# Patient Record
Sex: Female | Born: 1980 | Race: White | Hispanic: No | Marital: Single | Smoking: Former smoker
Health system: Southern US, Community
[De-identification: ages and names within clinical notes are randomized; demographics above are authoritative.]

## PROBLEM LIST (undated history)

## (undated) DIAGNOSIS — I10 Essential (primary) hypertension: Secondary | ICD-10-CM

## (undated) DIAGNOSIS — F1010 Alcohol abuse, uncomplicated: Secondary | ICD-10-CM

## (undated) DIAGNOSIS — Z9889 Other specified postprocedural states: Secondary | ICD-10-CM

## (undated) DIAGNOSIS — R112 Nausea with vomiting, unspecified: Secondary | ICD-10-CM

## (undated) DIAGNOSIS — Z8489 Family history of other specified conditions: Secondary | ICD-10-CM

## (undated) DIAGNOSIS — F329 Major depressive disorder, single episode, unspecified: Secondary | ICD-10-CM

## (undated) DIAGNOSIS — K2970 Gastritis, unspecified, without bleeding: Secondary | ICD-10-CM

## (undated) DIAGNOSIS — K7010 Alcoholic hepatitis without ascites: Secondary | ICD-10-CM

## (undated) DIAGNOSIS — K859 Acute pancreatitis without necrosis or infection, unspecified: Secondary | ICD-10-CM

## (undated) DIAGNOSIS — D509 Iron deficiency anemia, unspecified: Secondary | ICD-10-CM

## (undated) DIAGNOSIS — K922 Gastrointestinal hemorrhage, unspecified: Secondary | ICD-10-CM

## (undated) DIAGNOSIS — F32A Depression, unspecified: Secondary | ICD-10-CM

## (undated) HISTORY — PX: MULTIPLE TOOTH EXTRACTIONS: SHX2053

## (undated) HISTORY — PX: PLACEMENT OF BREAST IMPLANTS: SHX6334

---

## 2001-01-19 ENCOUNTER — Other Ambulatory Visit: Admission: RE | Admit: 2001-01-19 | Discharge: 2001-01-19 | Payer: Self-pay | Admitting: Obstetrics and Gynecology

## 2005-06-18 ENCOUNTER — Inpatient Hospital Stay (HOSPITAL_COMMUNITY): Admission: AD | Admit: 2005-06-18 | Discharge: 2005-06-18 | Payer: Self-pay | Admitting: *Deleted

## 2012-08-24 ENCOUNTER — Other Ambulatory Visit: Payer: Self-pay | Admitting: Internal Medicine

## 2012-08-24 DIAGNOSIS — R109 Unspecified abdominal pain: Secondary | ICD-10-CM

## 2012-08-31 ENCOUNTER — Ambulatory Visit
Admission: RE | Admit: 2012-08-31 | Discharge: 2012-08-31 | Disposition: A | Discharge status: Home / Self Care | Payer: BC Managed Care – PPO | Source: Ambulatory Visit | Attending: Internal Medicine | Admitting: Internal Medicine

## 2012-08-31 DIAGNOSIS — R109 Unspecified abdominal pain: Secondary | ICD-10-CM

## 2016-06-04 ENCOUNTER — Encounter (HOSPITAL_COMMUNITY): Payer: Self-pay | Admitting: Emergency Medicine

## 2016-06-04 ENCOUNTER — Observation Stay (HOSPITAL_COMMUNITY)
Admission: EM | Admit: 2016-06-04 | Discharge: 2016-06-05 | Disposition: A | Discharge status: Home / Self Care | Payer: Self-pay | Attending: Internal Medicine | Admitting: Internal Medicine

## 2016-06-04 ENCOUNTER — Emergency Department (HOSPITAL_COMMUNITY): Payer: Self-pay

## 2016-06-04 DIAGNOSIS — E872 Acidosis, unspecified: Secondary | ICD-10-CM

## 2016-06-04 DIAGNOSIS — F10239 Alcohol dependence with withdrawal, unspecified: Secondary | ICD-10-CM | POA: Insufficient documentation

## 2016-06-04 DIAGNOSIS — A419 Sepsis, unspecified organism: Secondary | ICD-10-CM

## 2016-06-04 DIAGNOSIS — K5730 Diverticulosis of large intestine without perforation or abscess without bleeding: Secondary | ICD-10-CM | POA: Insufficient documentation

## 2016-06-04 DIAGNOSIS — K760 Fatty (change of) liver, not elsewhere classified: Secondary | ICD-10-CM | POA: Insufficient documentation

## 2016-06-04 DIAGNOSIS — K219 Gastro-esophageal reflux disease without esophagitis: Secondary | ICD-10-CM | POA: Insufficient documentation

## 2016-06-04 DIAGNOSIS — F1010 Alcohol abuse, uncomplicated: Secondary | ICD-10-CM | POA: Insufficient documentation

## 2016-06-04 DIAGNOSIS — R112 Nausea with vomiting, unspecified: Secondary | ICD-10-CM | POA: Diagnosis present

## 2016-06-04 DIAGNOSIS — F10931 Alcohol use, unspecified with withdrawal delirium: Secondary | ICD-10-CM

## 2016-06-04 DIAGNOSIS — K2970 Gastritis, unspecified, without bleeding: Principal | ICD-10-CM | POA: Insufficient documentation

## 2016-06-04 DIAGNOSIS — R6510 Systemic inflammatory response syndrome (SIRS) of non-infectious origin without acute organ dysfunction: Secondary | ICD-10-CM

## 2016-06-04 DIAGNOSIS — F10231 Alcohol dependence with withdrawal delirium: Secondary | ICD-10-CM

## 2016-06-04 DIAGNOSIS — K226 Gastro-esophageal laceration-hemorrhage syndrome: Secondary | ICD-10-CM | POA: Insufficient documentation

## 2016-06-04 DIAGNOSIS — K922 Gastrointestinal hemorrhage, unspecified: Secondary | ICD-10-CM

## 2016-06-04 DIAGNOSIS — R739 Hyperglycemia, unspecified: Secondary | ICD-10-CM | POA: Insufficient documentation

## 2016-06-04 DIAGNOSIS — Z87891 Personal history of nicotine dependence: Secondary | ICD-10-CM | POA: Insufficient documentation

## 2016-06-04 DIAGNOSIS — E860 Dehydration: Secondary | ICD-10-CM | POA: Insufficient documentation

## 2016-06-04 LAB — I-STAT CHEM 8, ED
BUN: 9 mg/dL (ref 6–20)
CALCIUM ION: 1.01 mmol/L — AB (ref 1.15–1.40)
CREATININE: 0.8 mg/dL (ref 0.44–1.00)
Chloride: 105 mmol/L (ref 101–111)
Glucose, Bld: 173 mg/dL — ABNORMAL HIGH (ref 65–99)
HCT: 44 % (ref 36.0–46.0)
Hemoglobin: 15 g/dL (ref 12.0–15.0)
Potassium: 4.4 mmol/L (ref 3.5–5.1)
SODIUM: 139 mmol/L (ref 135–145)
TCO2: 21 mmol/L (ref 0–100)

## 2016-06-04 LAB — RAPID URINE DRUG SCREEN, HOSP PERFORMED
AMPHETAMINES: NOT DETECTED
BENZODIAZEPINES: NOT DETECTED
Barbiturates: NOT DETECTED
Cocaine: NOT DETECTED
Opiates: NOT DETECTED
TETRAHYDROCANNABINOL: NOT DETECTED

## 2016-06-04 LAB — COMPREHENSIVE METABOLIC PANEL
ALT: 77 U/L — AB (ref 14–54)
AST: 149 U/L — AB (ref 15–41)
Albumin: 4.8 g/dL (ref 3.5–5.0)
Alkaline Phosphatase: 64 U/L (ref 38–126)
Anion gap: 18 — ABNORMAL HIGH (ref 5–15)
BILIRUBIN TOTAL: 1.8 mg/dL — AB (ref 0.3–1.2)
BUN: 10 mg/dL (ref 6–20)
CALCIUM: 9.2 mg/dL (ref 8.9–10.3)
CO2: 20 mmol/L — ABNORMAL LOW (ref 22–32)
CREATININE: 0.95 mg/dL (ref 0.44–1.00)
Chloride: 100 mmol/L — ABNORMAL LOW (ref 101–111)
GFR calc Af Amer: >60 mL/min (ref >60)
Glucose, Bld: 172 mg/dL — ABNORMAL HIGH (ref 65–99)
Potassium: 4.3 mmol/L (ref 3.5–5.1)
Sodium: 138 mmol/L (ref 135–145)
TOTAL PROTEIN: 8.5 g/dL — AB (ref 6.5–8.1)

## 2016-06-04 LAB — POC OCCULT BLOOD, ED: Fecal Occult Bld: NEGATIVE

## 2016-06-04 LAB — URINALYSIS, ROUTINE W REFLEX MICROSCOPIC
Bilirubin Urine: NEGATIVE
Glucose, UA: NEGATIVE mg/dL
HGB URINE DIPSTICK: NEGATIVE
Ketones, ur: 80 mg/dL — AB
LEUKOCYTES UA: NEGATIVE
NITRITE: NEGATIVE
Protein, ur: 30 mg/dL — AB
pH: 7 (ref 5.0–8.0)

## 2016-06-04 LAB — CBC
HCT: 40 % (ref 36.0–46.0)
Hemoglobin: 13.4 g/dL (ref 12.0–15.0)
MCH: 32.4 pg (ref 26.0–34.0)
MCHC: 33.5 g/dL (ref 30.0–36.0)
MCV: 96.9 fL (ref 78.0–100.0)
PLATELETS: 402 10*3/uL — AB (ref 150–400)
RBC: 4.13 MIL/uL (ref 3.87–5.11)
RDW: 14.2 % (ref 11.5–15.5)
WBC: 7.3 10*3/uL (ref 4.0–10.5)

## 2016-06-04 LAB — ETHANOL

## 2016-06-04 LAB — TYPE AND SCREEN
ABO/RH(D): A POS
ANTIBODY SCREEN: NEGATIVE

## 2016-06-04 LAB — TSH: TSH: 1.754 u[IU]/mL (ref 0.350–4.500)

## 2016-06-04 LAB — ABO/RH: ABO/RH(D): A POS

## 2016-06-04 LAB — I-STAT BETA HCG BLOOD, ED (MC, WL, AP ONLY)

## 2016-06-04 LAB — I-STAT CG4 LACTIC ACID, ED
Lactic Acid, Venous: 3.71 mmol/L (ref 0.5–1.9)
Lactic Acid, Venous: 0.79 mmol/L (ref 0.5–1.9)

## 2016-06-04 LAB — ACETAMINOPHEN LEVEL: Acetaminophen (Tylenol), Serum: <10 ug/mL — ABNORMAL LOW (ref 10–30)

## 2016-06-04 LAB — LIPASE, BLOOD: Lipase: 33 U/L (ref 11–51)

## 2016-06-04 LAB — SEDIMENTATION RATE: Sed Rate: 1 mm/h (ref 0–22)

## 2016-06-04 MED ORDER — LORAZEPAM 2 MG/ML IJ SOLN: 1.0000 mg | Freq: Four times a day (QID) | INTRAMUSCULAR | Status: DC | PRN

## 2016-06-04 MED ORDER — SODIUM CHLORIDE 0.9 % IV SOLN
INTRAVENOUS | Status: DC
  Administered 2016-06-04 – 2016-06-05 (×2): via INTRAVENOUS

## 2016-06-04 MED ORDER — LORAZEPAM 1 MG PO TABS
1.0000 mg | ORAL_TABLET | Freq: Four times a day (QID) | ORAL | Status: DC | PRN
  Administered 2016-06-04: 1 mg via ORAL
  Filled 2016-06-04: qty 1

## 2016-06-04 MED ORDER — FOLIC ACID 1 MG PO TABS
1.0000 mg | ORAL_TABLET | Freq: Every day | ORAL | Status: DC
  Administered 2016-06-04 – 2016-06-05 (×2): 1 mg via ORAL
  Filled 2016-06-04 (×2): qty 1

## 2016-06-04 MED ORDER — LORAZEPAM 2 MG/ML IJ SOLN
0.0000 mg | Freq: Four times a day (QID) | INTRAMUSCULAR | Status: DC
  Administered 2016-06-05: 2 mg via INTRAVENOUS
  Filled 2016-06-04 (×2): qty 1

## 2016-06-04 MED ORDER — LORAZEPAM 2 MG/ML IJ SOLN
0.0000 mg | Freq: Two times a day (BID) | INTRAMUSCULAR | Status: DC
Start: 2016-06-06 — End: 2016-06-05

## 2016-06-04 MED ORDER — VITAMIN B-1 100 MG PO TABS
100.0000 mg | ORAL_TABLET | Freq: Every day | ORAL | Status: DC
  Administered 2016-06-04 – 2016-06-05 (×2): 100 mg via ORAL
  Filled 2016-06-04 (×2): qty 1

## 2016-06-04 MED ORDER — SODIUM CHLORIDE 0.9 % IV SOLN
8.0000 mg/h | INTRAVENOUS | Status: DC
  Administered 2016-06-04 (×2): 8 mg/h via INTRAVENOUS
  Filled 2016-06-04 (×4): qty 80

## 2016-06-04 MED ORDER — TRAMADOL HCL 50 MG PO TABS
50.0000 mg | ORAL_TABLET | Freq: Four times a day (QID) | ORAL | Status: DC | PRN
  Administered 2016-06-04 – 2016-06-05 (×2): 50 mg via ORAL
  Filled 2016-06-04 (×2): qty 1

## 2016-06-04 MED ORDER — THIAMINE HCL 100 MG/ML IJ SOLN: 100.0000 mg | Freq: Every day | INTRAMUSCULAR | Status: DC

## 2016-06-04 MED ORDER — LORAZEPAM 2 MG/ML IJ SOLN
1.0000 mg | Freq: Once | INTRAMUSCULAR | Status: AC
Start: 2016-06-04 — End: 2016-06-04
  Administered 2016-06-04: 1 mg via INTRAVENOUS
  Filled 2016-06-04: qty 1

## 2016-06-04 MED ORDER — ONDANSETRON 4 MG PO TBDP
4.0000 mg | ORAL_TABLET | Freq: Once | ORAL | Status: DC | PRN
  Filled 2016-06-04: qty 1

## 2016-06-04 MED ORDER — LORAZEPAM 2 MG/ML IJ SOLN
0.5000 mg | Freq: Once | INTRAMUSCULAR | Status: AC
  Administered 2016-06-04: 0.5 mg via INTRAVENOUS
  Filled 2016-06-04: qty 1

## 2016-06-04 MED ORDER — LORAZEPAM 2 MG/ML IJ SOLN
0.0000 mg | Freq: Four times a day (QID) | INTRAMUSCULAR | Status: DC
Start: 2016-06-04 — End: 2016-06-05

## 2016-06-04 MED ORDER — SODIUM CHLORIDE 0.9 % IV BOLUS (SEPSIS)
1000.0000 mL | Freq: Once | INTRAVENOUS | Status: AC
  Administered 2016-06-04: 1000 mL via INTRAVENOUS

## 2016-06-04 MED ORDER — SODIUM CHLORIDE 0.9 % IV SOLN
80.0000 mg | Freq: Once | INTRAVENOUS | Status: AC
  Administered 2016-06-04: 80 mg via INTRAVENOUS
  Filled 2016-06-04: qty 80

## 2016-06-04 MED ORDER — SODIUM CHLORIDE 0.9 % IV BOLUS (SEPSIS)
2000.0000 mL | Freq: Once | INTRAVENOUS | Status: AC
  Administered 2016-06-04: 2000 mL via INTRAVENOUS

## 2016-06-04 MED ORDER — IOPAMIDOL (ISOVUE-300) INJECTION 61%
INTRAVENOUS | Status: AC
  Administered 2016-06-04: 100 mL
  Filled 2016-06-04: qty 100

## 2016-06-04 MED ORDER — ONDANSETRON HCL 4 MG PO TABS: 4.0000 mg | ORAL_TABLET | Freq: Four times a day (QID) | ORAL | Status: DC | PRN

## 2016-06-04 MED ORDER — ONDANSETRON HCL 4 MG/2ML IJ SOLN
4.0000 mg | Freq: Four times a day (QID) | INTRAMUSCULAR | Status: DC | PRN
  Administered 2016-06-05: 4 mg via INTRAVENOUS
  Filled 2016-06-04: qty 2

## 2016-06-04 MED ORDER — GI COCKTAIL ~~LOC~~
30.0000 mL | Freq: Once | ORAL | Status: AC
  Administered 2016-06-04: 30 mL via ORAL
  Filled 2016-06-04: qty 30

## 2016-06-04 MED ORDER — ONDANSETRON HCL 4 MG/2ML IJ SOLN
4.0000 mg | Freq: Once | INTRAMUSCULAR | Status: AC
  Administered 2016-06-04: 4 mg via INTRAVENOUS
  Filled 2016-06-04: qty 2

## 2016-06-04 MED ORDER — SODIUM CHLORIDE 0.9 % IV SOLN
INTRAVENOUS | Status: DC
  Administered 2016-06-04: 15:00:00 via INTRAVENOUS

## 2016-06-04 MED ORDER — ADULT MULTIVITAMIN W/MINERALS CH
1.0000 | ORAL_TABLET | Freq: Every day | ORAL | Status: DC
  Administered 2016-06-04 – 2016-06-05 (×2): 1 via ORAL
  Filled 2016-06-04 (×2): qty 1

## 2016-06-04 NOTE — ED Notes (Signed)
Patient transported to CT 

## 2016-06-04 NOTE — ED Notes (Signed)
Unable to collect labs patient is talking with MD

## 2016-06-04 NOTE — ED Triage Notes (Addendum)
Pt c/o shaking, abdominal pain, emesis, lightheadedness onset last night. Firm swelling above umbilicus. Pt reports feeling same symptoms about 2 weeks before menstruation for about 6 months, symptoms usually resolve spontaneously, including abdominal swelling, symptoms have been as bad as they are today in past. No diarrhea.

## 2016-06-04 NOTE — Progress Notes (Signed)
ED CM noted CM consult for medication assistance   After reviewing EDP note to find pt primarily on OTC medications at this time, there is not a [facility] Medication program to assist with OTC med costs  Med list noted as acetaminophen (TYLENOL) 325 MG tablet Take 650 mg by mouth every 6 (six) hours as needed.  Cyanocobalamin (B-12 PO) Take 1 tablet by mouth daily.    esomeprazole (NEXIUM) 20 MG capsule Take 20 mg by mouth daily at 12 noon.    ferrous sulfate 325 (65 FE) MG tablet Take 325 mg by mouth daily with breakfast.    Multiple Vitamin (MULTIVITAMIN WITH MINERALS) TABS tablet Take 2 tablets by mouth daily      Pending possible other medications upon d/c

## 2016-06-04 NOTE — Progress Notes (Signed)
Spoke with pt, female visitor and female visitor about pt OTC meds and goodrx for use to get discount for any prescribed meds

## 2016-06-04 NOTE — H&P (Signed)
History and Physical    Susan [name] MBP:[id number] DOB: 1981/01/18 DOA: 06/04/2016  PCP: No PCP Per Patient   Patient coming from: Home  Chief Complaint: Nausea/Vomiting/ Throwing up [place] Shark Vomit  HPI: Susan [name] is a 35 y.o. female with medical history significant of Previous Tobacco Abuse and EtOH use/abuse and no other real comorbids who presents to [facility] with a cc of N/V and episodes of coffee ground emesis that started last night after she got home from a family Christmas party where she was drinking "some sips of  Her and her dad's drink." States she usually gets Naueous and vomits violently when she ovulates and states she had her period first start 2 weeks ago. States she vomitted so much that she came to the ED for evaluation this Am. Admitted to abdominal tenderness and Nausea. No CP or SOB. No Diarrhea. States she vomitted "brown looking vomit like coffee." Has been a very heavy drinker in the past and states she still drinks. Per EDP he states she admitted to him that she enjoys vodka. No other concerns or complaints except that the patient was very tremulous and sweaty/anxious and felt clammy after vomiting and felt nervous and had electrical sensations run in her body. EDP was concerned so she was admitted for suspected Upper GI Bleed, Intractable N/V and Concern for active EtOH withdrawal given her drinking history.   ED Course: Bolused 3 liters and had CT of Abd/Pelvis which was unreavealing. Given 2 doses of Ativan.   Review of Systems: As per HPI otherwise 10 point review of systems negative.   MEDICAL HISTORY History reviewed. No pertinent past medical history.  SURGICAL HISTORY History reviewed. Has had recent Breast Implants.   SOCIAL HISTORY  reports that she has quit smoking. She has never used smokeless tobacco. She reports that she drinks alcohol. She reports that she does not use drugs.  No Known Allergies  FAMILY HISTORY History reviewed. No  pertinent family history on Fathers side as patient does not communicate with father. Mother has Diabetes and also GI Issues.   Prior to Admission medications   Medication Sig Start Date End Date Taking? Authorizing Provider  acetaminophen (TYLENOL) 325 MG tablet Take 650 mg by mouth every 6 (six) hours as needed.   Yes Historical Provider, MD  Cyanocobalamin (B-12 PO) Take 1 tablet by mouth daily.   Yes Historical Provider, MD  esomeprazole (NEXIUM) 20 MG capsule Take 20 mg by mouth daily at 12 noon.   Yes Historical Provider, MD  ferrous sulfate 325 (65 FE) MG tablet Take 325 mg by mouth daily with breakfast.   Yes Historical Provider, MD  Multiple Vitamin (MULTIVITAMIN WITH MINERALS) TABS tablet Take 2 tablets by mouth daily.   Yes Historical Provider, MD    Physical Exam: Vitals:   06/04/16 1103 06/04/16 1124 06/04/16 1200 06/04/16 1415  BP: 109/90 132/94 127/96 127/96  Pulse: 107 103 99 104  Resp: 14 19 18    Temp:      TempSrc:      SpO2: 99% 99% 98%    Constitutional: WN/WD, NAD and appears calm and comfortable Eyes: Lids and conjunctivae normal, sclerae anicteric  ENMT: External Ears, Nose appear normal. Grossly normal hearing.  Neck: Appears normal, supple, no cervical masses, normal ROM, no appreciable thyromegaly Respiratory: Clear to auscultation bilaterally, no wheezing, rales, rhonchi or crackles. Normal respiratory effort and patient is not tachypenic. No accessory muscle use.  Cardiovascular: RRR, no murmurs / rubs / gallops. S1  and S2 auscultated. No extremity edema.  Abdomen: Soft, Mildly tender to palpation (patient doesn't grimace but states its tender), non-distended. No masses palpated. No appreciable hepatosplenomegaly. Bowel sounds positive x4.  GU: Deferred. Musculoskeletal: No clubbing / cyanosis of digits/nails. No joint deformity upper and lower extremities. Good ROM, no contractures. Normal strength and muscle tone.  Skin: No rashes, lesions, ulcers. No  induration; Warm and dry.  Neurologic: CN 2-12 grossly intact with no focal deficits. Sensation intact in all 4 Extremities. Romberg sign cerebellar reflexes not assessed.  Psychiatric: Normal judgment and insight. Alert and oriented x 3. Normal mood and appropriate affect.   Labs on Admission: I have personally reviewed following labs and imaging studies  CBC:  Recent Labs Lab 06/04/16 1055 06/04/16 1111  WBC 7.3  --   HGB 13.4 15.0  HCT 40.0 44.0  MCV 96.9  --   PLT 402*  --    Basic Metabolic Panel:  Recent Labs Lab 06/04/16 1055 06/04/16 1111  NA 138 139  K 4.3 4.4  CL 100* 105  CO2 20*  --   GLUCOSE 172* 173*  BUN 10 9  CREATININE 0.95 0.80  CALCIUM 9.2  --    GFR: CrCl cannot be calculated (Unknown ideal weight.). Liver Function Tests:  Recent Labs Lab 06/04/16 1055  AST 149*  ALT 77*  ALKPHOS 64  BILITOT 1.8*  PROT 8.5*  ALBUMIN 4.8    Recent Labs Lab 06/04/16 1055  LIPASE 33   No results for input(s): AMMONIA in the last 168 hours. Coagulation Profile: No results for input(s): INR, PROTIME in the last 168 hours. Cardiac Enzymes: No results for input(s): CKTOTAL, CKMB, CKMBINDEX, TROPONINI in the last 168 hours. BNP (last 3 results) No results for input(s): PROBNP in the last 8760 hours. HbA1C: No results for input(s): HGBA1C in the last 72 hours. CBG: No results for input(s): GLUCAP in the last 168 hours. Lipid Profile: No results for input(s): CHOL, HDL, LDLCALC, TRIG, CHOLHDL, LDLDIRECT in the last 72 hours. Thyroid Function Tests: No results for input(s): TSH, T4TOTAL, FREET4, T3FREE, THYROIDAB in the last 72 hours. Anemia Panel: No results for input(s): VITAMINB12, FOLATE, FERRITIN, TIBC, IRON, RETICCTPCT in the last 72 hours. Urine analysis:    Component Value Date/Time   COLORURINE YELLOW 06/04/2016 1238   APPEARANCEUR HAZY (A) 06/04/2016 1238   LABSPEC >1.046 (H) 06/04/2016 1238   PHURINE 7.0 06/04/2016 1238   GLUCOSEU  NEGATIVE 06/04/2016 1238   HGBUR NEGATIVE 06/04/2016 1238   BILIRUBINUR NEGATIVE 06/04/2016 1238   KETONESUR 80 (A) 06/04/2016 1238   PROTEINUR 30 (A) 06/04/2016 1238   NITRITE NEGATIVE 06/04/2016 1238   LEUKOCYTESUR NEGATIVE 06/04/2016 1238   Sepsis Labs: !!!!!!!!!!!!!!!!!!!!!!!!!!!!!!!!!!!!!!!!!!!! @LABRCNTIP (procalcitonin:4,lacticidven:4) )No results found for this or any previous visit (from the past 240 hour(s)).   Radiological Exams on Admission: Ct Abdomen Pelvis W Contrast  Result Date: 06/04/2016 CLINICAL DATA:  Abdominal pain with emesis EXAM: CT ABDOMEN AND PELVIS WITH CONTRAST TECHNIQUE: Multidetector CT imaging of the abdomen and pelvis was performed using the standard protocol following bolus administration of intravenous contrast. CONTRAST:  165m ISOVUE-300 IOPAMIDOL (ISOVUE-300) INJECTION 61% COMPARISON:  Right upper quadrant ultrasound August 31, 2012 FINDINGS: Lower chest: Lung bases are clear. Hepatobiliary: There is hepatic steatosis. No focal liver lesions are evident. Gallbladder wall is not appreciably thickened. There is no biliary duct dilatation. Pancreas: There is no pancreatic mass or inflammatory focus. Spleen: No splenic lesions are evident. Adrenals/Urinary Tract: Adrenals appear normal bilaterally. Kidneys bilaterally  show no evidence of mass or hydronephrosis on either side. There is no renal or ureteral calculus on either side. Urinary bladder is midline with wall thickness within normal limits. Stomach/Bowel: There are occasional sigmoid diverticula without diverticulitis. There is no appreciable bowel wall or mesenteric thickening. No bowel obstruction. No free air or portal venous air. Vascular/Lymphatic: There is no abdominal aortic aneurysm. No vascular lesions are evident. There is a circumaortic left renal vein, an anatomic variant. There is no adenopathy in the abdomen or pelvis. Reproductive: Uterus is anteverted. There is no pelvic mass or pelvic fluid  collection. Other: The appendix appears normal. There is no ascites or abscess in the abdomen or pelvis. Musculoskeletal: There are no blastic or lytic bone lesions. There is mid lumbar dextroscoliosis. There is no intramuscular or abdominal wall lesion. IMPRESSION: Occasional sigmoid diverticula without diverticulitis. No bowel obstruction. No abscess. Appendix region appears unremarkable. No renal or ureteral calculus.  No hydronephrosis. There is hepatic steatosis. A cause for patient's symptoms has not been ascertained with this study. Electronically Signed   By: Lowella Grip III M.D.   On: 06/04/2016 12:37    EKG: Independently reviewed. Sinus Tachycardia with artifact. No ST Elevation or Depression on my read. Had some qtC prolongation at 530. Will repeat EKG.  Assessment/Plan Active Problems:   Intractable vomiting with nausea  Acute Upper GI Bleed from likely from Alcholic Gastritis/Mallory Weiss Tear from N/V -Admit to Tele Obs -Patient admits to drinking a "few sips" of EtOH last night and throwing up coffee ground emesis and has been vomiting last night since 7:00 pm; Per EDP drinks vodka -Gastroenterology Consulted by EDP and Dr. 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 No bowel obstruction. No abscess. Appendix region appears unremarkable. No renal or ureteral calculus. No hydronephrosis. There is hepatic steatosis. A cause for patient's symptoms has not been ascertained with this study. -Lipase Level 32 -Check HbA1c to r/o Gastroparesis  SIRS and Lactic Acidosis  -Likely from Vomiting -Lactic Acid Level was 3.74 on Admission and  trended down to 0.79   Hyperglycemia -Likely Reactive -Check HbA1c- R/o Gastroparesis  Hepatic Steatosis and Elevated LFT's -Likely from EtOH Abuse -AST was 149 and ALT 77 with Characteristic 2:1 ratio indicative of EtOH -Check Acute Hepatitis Panel if not trending down -Tylenol Level <10 and EtOH <5 -Repeat CMP in AM  EtOH Abuse with Concern for Active Withdrawal  -UDS was Negative and EtOH was <5 -EtOH Abuse Counseling given -CIWA Protocol with Ativan -Contine MVI, Folic Acid, and Thaimine  DVT prophylaxis: SCDs Code Status: FULL CODE Family Communication: Discussed with Mother at Bedside Disposition Plan: Telemetry Obs Consults called: Gastroenterology by EDP Admission status: Observation  Kerney Elbe, D.O. [facility] Pager [phone]  If 7PM-7AM, please contact night-coverage [email or web address] Password [facility]  06/04/2016, 3:14 PM

## 2016-06-04 NOTE — Progress Notes (Signed)
CM spoke with pt who confirms uninsured [employer] resident with no pcp.  CM discussed and provided written information to assist pt with determining choice for uninsured accepting pcps, discussed the importance of pcp vs EDP services for f/u care, www.needymeds.org, www.goodrx.com, discounted pharmacies and other [employer] such as [employer] [facility] [employer], affordable care act, financial assistance, uninsured dental services, [place] med assist, DSS and  health department  Reviewed resources for [employer] uninsured accepting pcps like [facility], family medicine at [employer], [facility], [facility], local urgent care centers, [facility], [facility] family practice, general medical clinics, family services of the [place], [facility] urgent care plus others, medication resources, [facility] out patient pharmacies and housing Pt voiced understanding and appreciation of resources provided   Provided [facility] contact information

## 2016-06-04 NOTE — ED Provider Notes (Signed)
[facility]-EMERGENCY DEPT Provider Note   CSN: [id number] Arrival date & time: 06/04/16  3716     History   Chief Complaint Chief Complaint  Patient presents with  . Abdominal Pain    HPI Susan [name] is a 35 y.o. female.  HPI Patient presents with multiple episodes of vomiting starting yesterday evening. States no gross blood but vomitus with coffee-ground-like substance. No melena or gross blood in stool. Had small bowel movement today. Complains of central abdominal pain without radiation. No dysuria or frequency. No vaginal symptoms. Last menstrual period was 2 weeks ago. Patient also has been lightheaded with near syncope. Symptoms are worse with standing. Denies fever or chills. Denies recent NSAID use.  History reviewed. No pertinent past medical history.  There are no active problems to display for this patient.   History reviewed. No pertinent surgical history.  OB History    No data available       Home Medications    Prior to Admission medications   Medication Sig Start Date End Date Taking? Authorizing Provider  acetaminophen (TYLENOL) 325 MG tablet Take 650 mg by mouth every 6 (six) hours as needed.   Yes Historical Provider, MD  Cyanocobalamin (B-12 PO) Take 1 tablet by mouth daily.   Yes Historical Provider, MD  esomeprazole (NEXIUM) 20 MG capsule Take 20 mg by mouth daily at 12 noon.   Yes Historical Provider, MD  ferrous sulfate 325 (65 FE) MG tablet Take 325 mg by mouth daily with breakfast.   Yes Historical Provider, MD  Multiple Vitamin (MULTIVITAMIN WITH MINERALS) TABS tablet Take 2 tablets by mouth daily.   Yes Historical Provider, MD    Family History History reviewed. No pertinent family history.  Social History Social History  Substance Use Topics  . Smoking status: Former Games developer  . Smokeless tobacco: Never Used  . Alcohol use Yes     Comment: occasional     Allergies   Patient has no known allergies.   Review of Systems Review of  Systems  Constitutional: Negative for chills and fatigue.  Respiratory: Negative for shortness of breath.   Cardiovascular: Negative for chest pain.  Gastrointestinal: Positive for abdominal pain, constipation, nausea and vomiting. Negative for blood in stool and diarrhea.  Genitourinary: Negative for dysuria, flank pain, frequency and pelvic pain.  Musculoskeletal: Negative for arthralgias, back pain, joint swelling, myalgias, neck pain and neck stiffness.  Skin: Negative for rash and wound.  Neurological: Positive for dizziness, tremors, weakness (generalized) and light-headedness. Negative for numbness and headaches.  All other systems reviewed and are negative.    Physical Exam Updated Vital Signs BP 132/94 (BP Location: Left Arm)   Pulse 103   Temp 98.4 F (36.9 C) (Oral)   Resp 19   LMP 05/21/2016   SpO2 99%   Physical Exam  Constitutional: She is oriented to person, place, and time. She appears well-developed and well-nourished.  Actively vomiting in triage  HENT:  Head: Normocephalic and atraumatic.  Mouth/Throat: Oropharynx is clear and moist. No oropharyngeal exudate.  Eyes: EOM are normal. Pupils are equal, round, and reactive to light.  Neck: Normal range of motion. Neck supple.  No meningismus  Cardiovascular: Regular rhythm.   Tachycardia  Pulmonary/Chest: Effort normal and breath sounds normal.  Abdominal: Soft. Bowel sounds are normal. There is tenderness. There is no rebound and no guarding.  Periumbilical and epigastric tenderness with palpation. Questionable guarding.  Musculoskeletal: Normal range of motion. She exhibits no edema or tenderness.  No CVA tenderness bilaterally. No lower extremity swelling or tenderness. Distal pulses thready  Neurological: She is alert and oriented to person, place, and time.  Tremor noted. Patient is oriented 4. 5/5 motor in all extremities. Sensation is grossly intact.  Skin: Skin is warm and dry. Capillary refill takes  less than 2 seconds. No rash noted. No erythema. There is pallor.  Pale appearing  Psychiatric: She has a normal mood and affect. Her behavior is normal.  Nursing note and vitals reviewed.    ED Treatments / Results  Labs (all labs ordered are listed, but only abnormal results are displayed) Labs Reviewed  COMPREHENSIVE METABOLIC PANEL - Abnormal; Notable for the following:       Result Value   Chloride 100 (*)    CO2 20 (*)    Glucose, Bld 172 (*)    Total Protein 8.5 (*)    AST 149 (*)    ALT 77 (*)    Total Bilirubin 1.8 (*)    Anion gap 18 (*)    All other components within normal limits  CBC - Abnormal; Notable for the following:    Platelets 402 (*)    All other components within normal limits  URINALYSIS, ROUTINE W REFLEX MICROSCOPIC - Abnormal; Notable for the following:    APPearance HAZY (*)    Specific Gravity, Urine >1.046 (*)    Ketones, ur 80 (*)    Protein, ur 30 (*)    Bacteria, UA FEW (*)    Squamous Epithelial / LPF 6-30 (*)    All other components within normal limits  I-STAT CHEM 8, ED - Abnormal; Notable for the following:    Glucose, Bld 173 (*)    Calcium, Ion 1.01 (*)    All other components within normal limits  I-STAT CG4 LACTIC ACID, ED - Abnormal; Notable for the following:    Lactic Acid, Venous 3.71 (*)    All other components within normal limits  LIPASE, BLOOD  ACETAMINOPHEN LEVEL  SEDIMENTATION RATE  RAPID URINE DRUG SCREEN, HOSP PERFORMED  ETHANOL  I-STAT BETA HCG BLOOD, ED ([facility], [facility], [facility] ONLY)  POC OCCULT BLOOD, ED  I-STAT CG4 LACTIC ACID, ED  TYPE AND SCREEN  ABO/RH    EKG  EKG Interpretation  Date/Time:  Tuesday June 04 2016 11:01:11 EST Ventricular Rate:  113 PR Interval:    QRS Duration: 113 QT Interval:  386 QTC Calculation: 530 R Axis:   101 Text Interpretation:  Sinus tachycardia Borderline intraventricular conduction delay Borderline T abnormalities, anterior leads Prolonged QT interval Confirmed by Jaydin Jalomo   MD, Gayatri Teasdale ([id number]) on 06/04/2016 12:01:27 PM       Radiology Ct Abdomen Pelvis W Contrast  Result Date: 06/04/2016 CLINICAL DATA:  Abdominal pain with emesis EXAM: CT ABDOMEN AND PELVIS WITH CONTRAST TECHNIQUE: Multidetector CT imaging of the abdomen and pelvis was performed using the standard protocol following bolus administration of intravenous contrast. CONTRAST:  ISOVUE-300 IOPAMIDOL (ISOVUE-300) INJECTION 61% COMPARISON:  Right upper quadrant ultrasound August 31, 2012 FINDINGS: Lower chest: Lung bases are clear. Hepatobiliary: There is hepatic steatosis. No focal liver lesions are evident. Gallbladder wall is not appreciably thickened. There is no biliary duct dilatation. Pancreas: There is no pancreatic mass or inflammatory focus. Spleen: No splenic lesions are evident. Adrenals/Urinary Tract: Adrenals appear normal bilaterally. Kidneys bilaterally show no evidence of mass or hydronephrosis on either side. There is no renal or ureteral calculus on either side. Urinary bladder is midline with wall thickness within  normal limits. Stomach/Bowel: There are occasional sigmoid diverticula without diverticulitis. There is no appreciable bowel wall or mesenteric thickening. No bowel obstruction. No free air or portal venous air. Vascular/Lymphatic: There is no abdominal aortic aneurysm. No vascular lesions are evident. There is a circumaortic left renal vein, an anatomic variant. There is no adenopathy in the abdomen or pelvis. Reproductive: Uterus is anteverted. There is no pelvic mass or pelvic fluid collection. Other: The appendix appears normal. There is no ascites or abscess in the abdomen or pelvis. Musculoskeletal: There are no blastic or lytic bone lesions. There is mid lumbar dextroscoliosis. There is no intramuscular or abdominal wall lesion. IMPRESSION: Occasional sigmoid diverticula without diverticulitis. No bowel obstruction. No abscess. Appendix region appears unremarkable. No renal or  ureteral calculus.  No hydronephrosis. There is hepatic steatosis. A cause for patient's symptoms has not been ascertained with this study. Electronically Signed   By: Bretta BangWilliam  Woodruff III M.D.   On: 06/04/2016 12:37    Procedures Procedures (including critical care time)  Medications Ordered in ED Medications  ondansetron (ZOFRAN-ODT) disintegrating tablet 4 mg (not administered)  pantoprazole (PROTONIX) 80 mg in sodium chloride 0.9 % 250 mL (0.32 mg/mL) infusion (8 mg/hr Intravenous New Bag/Given 06/04/16 1144)  LORazepam (ATIVAN) injection 0-4 mg (not administered)    Followed by  LORazepam (ATIVAN) injection 0-4 mg (not administered)  0.9 %  sodium chloride infusion (not administered)  sodium chloride 0.9 % bolus 2,000 mL (0 mLs Intravenous Stopped 06/04/16 1124)  ondansetron (ZOFRAN) injection 4 mg (4 mg Intravenous Given 06/04/16 1124)  pantoprazole (PROTONIX) 80 mg in sodium chloride 0.9 % 100 mL IVPB (0 mg Intravenous Stopped 06/04/16 1210)  LORazepam (ATIVAN) injection 1 mg (1 mg Intravenous Given 06/04/16 1142)  iopamidol (ISOVUE-300) 61 % injection (100 mLs  Contrast Given 06/04/16 1211)  sodium chloride 0.9 % bolus 1,000 mL (0 mLs Intravenous Stopped 06/04/16 1340)  gi cocktail (Maalox,Lidocaine,Donnatal) (30 mLs Oral Given 06/04/16 1339)  LORazepam (ATIVAN) injection 0.5 mg (0.5 mg Intravenous Given 06/04/16 1417)     Initial Impression / Assessment and Plan / ED Course  I have reviewed the triage vital signs and the nursing notes.  Pertinent labs & imaging results that were available during my care of the patient were reviewed by me and considered in my medical decision making (see chart for details).  Clinical Course   Patient with elevated lactic acid. Likely due to dehydration. Given several liters of IV fluids with improvement of lactic acid levels. Patient does have prolonged QT interval of uncertain significance. Patient's tremors that have improved with Ativan.  Suspect there is some degree of alcohol withdrawal. Placed on CIWA protocol. Discussed with gastroenterologist who will see patient in consult. Hospitalist to admit.    Final Clinical Impressions(s) / ED Diagnoses   Final diagnoses:  Intractable vomiting with nausea, unspecified vomiting type  Dehydration  Lactic acidosis    New Prescriptions New Prescriptions   No medications on file     Loren Raceravid Demonie Kassa, MD 06/04/16 1442

## 2016-06-05 ENCOUNTER — Observation Stay (HOSPITAL_COMMUNITY): Payer: Self-pay | Admitting: Anesthesiology

## 2016-06-05 ENCOUNTER — Encounter (HOSPITAL_COMMUNITY): Payer: Self-pay

## 2016-06-05 ENCOUNTER — Encounter (HOSPITAL_COMMUNITY)
Admission: EM | Disposition: A | Discharge status: Home / Self Care | Payer: Self-pay | Source: Home / Self Care | Attending: Emergency Medicine

## 2016-06-05 DIAGNOSIS — G43A1 Cyclical vomiting, intractable: Secondary | ICD-10-CM

## 2016-06-05 HISTORY — PX: ESOPHAGOGASTRODUODENOSCOPY (EGD) WITH PROPOFOL: SHX5813

## 2016-06-05 LAB — COMPREHENSIVE METABOLIC PANEL
ALT: 48 U/L (ref 14–54)
ANION GAP: 10 (ref 5–15)
AST: 78 U/L — AB (ref 15–41)
Albumin: 3.7 g/dL (ref 3.5–5.0)
Alkaline Phosphatase: 47 U/L (ref 38–126)
BUN: <5 mg/dL — ABNORMAL LOW (ref 6–20)
CHLORIDE: 104 mmol/L (ref 101–111)
CO2: 21 mmol/L — ABNORMAL LOW (ref 22–32)
Calcium: 7.6 mg/dL — ABNORMAL LOW (ref 8.9–10.3)
Creatinine, Ser: 0.78 mg/dL (ref 0.44–1.00)
Glucose, Bld: 71 mg/dL (ref 65–99)
POTASSIUM: 3.2 mmol/L — AB (ref 3.5–5.1)
Sodium: 135 mmol/L (ref 135–145)
TOTAL PROTEIN: 6.6 g/dL (ref 6.5–8.1)
Total Bilirubin: 1.7 mg/dL — ABNORMAL HIGH (ref 0.3–1.2)

## 2016-06-05 LAB — GLUCOSE, CAPILLARY: Glucose-Capillary: 76 mg/dL (ref 65–99)

## 2016-06-05 LAB — CBC
HEMATOCRIT: 35 % — AB (ref 36.0–46.0)
Hemoglobin: 11.9 g/dL — ABNORMAL LOW (ref 12.0–15.0)
MCH: 32.7 pg (ref 26.0–34.0)
MCHC: 34 g/dL (ref 30.0–36.0)
MCV: 96.2 fL (ref 78.0–100.0)
Platelets: 284 10*3/uL (ref 150–400)
RBC: 3.64 MIL/uL — AB (ref 3.87–5.11)
RDW: 13.9 % (ref 11.5–15.5)
WBC: 6.2 10*3/uL (ref 4.0–10.5)

## 2016-06-05 SURGERY — ESOPHAGOGASTRODUODENOSCOPY (EGD) WITH PROPOFOL
Anesthesia: Monitor Anesthesia Care

## 2016-06-05 MED ORDER — SUCRALFATE 1 GM/10ML PO SUSP: 1.0000 g | Freq: Three times a day (TID) | ORAL | 0 refills | Status: DC

## 2016-06-05 MED ORDER — PANTOPRAZOLE SODIUM 40 MG PO TBEC
40.0000 mg | DELAYED_RELEASE_TABLET | Freq: Every day | ORAL | Status: DC
  Administered 2016-06-05: 40 mg via ORAL
  Filled 2016-06-05: qty 1

## 2016-06-05 MED ORDER — LACTATED RINGERS IV SOLN
INTRAVENOUS | Status: DC
  Administered 2016-06-05: 12:00:00 via INTRAVENOUS

## 2016-06-05 MED ORDER — PHENYLEPHRINE 40 MCG/ML (10ML) SYRINGE FOR IV PUSH (FOR BLOOD PRESSURE SUPPORT)
PREFILLED_SYRINGE | INTRAVENOUS | Status: AC
  Filled 2016-06-05: qty 10

## 2016-06-05 MED ORDER — PROPOFOL 10 MG/ML IV BOLUS
INTRAVENOUS | Status: AC
  Filled 2016-06-05: qty 40

## 2016-06-05 MED ORDER — FOLIC ACID 1 MG PO TABS: 1.0000 mg | ORAL_TABLET | Freq: Every day | ORAL | 1 refills | Status: DC

## 2016-06-05 MED ORDER — PROPOFOL 500 MG/50ML IV EMUL
INTRAVENOUS | Status: DC | PRN
  Administered 2016-06-05: 150 ug/kg/min via INTRAVENOUS

## 2016-06-05 MED ORDER — PROPOFOL 10 MG/ML IV BOLUS
INTRAVENOUS | Status: AC
  Filled 2016-06-05: qty 20

## 2016-06-05 MED ORDER — PROPOFOL 500 MG/50ML IV EMUL
INTRAVENOUS | Status: DC | PRN
  Administered 2016-06-05: 30 mg via INTRAVENOUS
  Administered 2016-06-05: 60 mg via INTRAVENOUS
  Administered 2016-06-05: 30 mg via INTRAVENOUS

## 2016-06-05 MED ORDER — THIAMINE HCL 100 MG PO TABS: 100.0000 mg | ORAL_TABLET | Freq: Every day | ORAL | 1 refills | Status: DC

## 2016-06-05 MED ORDER — POTASSIUM CHLORIDE IN NACL 40-0.9 MEQ/L-% IV SOLN
INTRAVENOUS | Status: DC
  Administered 2016-06-05: 100 mL/h via INTRAVENOUS
  Filled 2016-06-05: qty 1000

## 2016-06-05 NOTE — Consult Note (Signed)
Referring Provider: Dr. Ranae Palms  Primary Care Physician:  No PCP Per Patient Primary Gastroenterologist:  Gentry Fitz  Reason for Consultation:  Coffee-ground emesis  HPI: Susan [name] is a 35 y.o. female past medical history of long-standing acid reflux and alcohol use was admitted to the hospital with nausea and vomiting. Patient complaining of on and off acid reflux since childhood. Has tried over-the-counter Nexium in the past without any benefit. Patient started noticing worsening acid reflux since last 4 months. Had noticed coffee-ground emesis yesterday. Denied any black stool or bright red blood per rectum. Complaining of epigastric pain. Also complaining of constipation with 1-2 bowel movements a week. Denied any diarrhea. According to patient she used to drink heavily in the past but has slow down in last several months.  No family history of colon cancer in first-degree relatives.  History reviewed. No pertinent past medical history.  History reviewed. No pertinent surgical history.  Prior to Admission medications   Medication Sig Start Date End Date Taking? Authorizing Provider  acetaminophen (TYLENOL) 325 MG tablet Take 650 mg by mouth every 6 (six) hours as needed.   Yes Historical Provider, MD  Cyanocobalamin (B-12 PO) Take 1 tablet by mouth daily.   Yes Historical Provider, MD  esomeprazole (NEXIUM) 20 MG capsule Take 20 mg by mouth daily at 12 noon.   Yes Historical Provider, MD  ferrous sulfate 325 (65 FE) MG tablet Take 325 mg by mouth daily with breakfast.   Yes Historical Provider, MD  Multiple Vitamin (MULTIVITAMIN WITH MINERALS) TABS tablet Take 2 tablets by mouth daily.   Yes Historical Provider, MD    Scheduled Meds: . folic acid  1 mg Oral Daily  . LORazepam  0-4 mg Intravenous Q6H   Followed by  . [START ON 06/06/2016] LORazepam  0-4 mg Intravenous Q12H  . multivitamin with minerals  1 tablet Oral Daily  . pantoprazole  40 mg Oral Daily  . thiamine  100  mg Oral Daily   Continuous Infusions: . 0.9 % NaCl with KCl 40 mEq / L 100 mL/hr (06/05/16 0913)   PRN Meds:.LORazepam **OR** LORazepam, ondansetron **OR** ondansetron (ZOFRAN) IV, traMADol  Allergies as of 06/04/2016  . (No Known Allergies)    History reviewed. No pertinent family history.  Social History   Social History  . Marital status: Married    Spouse name: N/A  . Number of children: N/A  . Years of education: N/A   Occupational History  . Not on file.   Social History Main Topics  . Smoking status: Former Games developer  . Smokeless tobacco: Never Used  . Alcohol use Yes     Comment: occasional  . Drug use: No  . Sexual activity: Not on file   Other Topics Concern  . Not on file   Social History Narrative  . No narrative on file    Review of Systems: All negative except as stated above in HPI. Review of Systems  Constitutional: Positive for malaise/fatigue. Negative for chills and fever.  HENT: Negative for ear discharge, hearing loss, nosebleeds, sinus pain and tinnitus.   Eyes: Negative for photophobia, pain and discharge.  Respiratory: Negative for hemoptysis, sputum production and wheezing.   Cardiovascular: Negative for chest pain, palpitations and leg swelling.  Gastrointestinal: Positive for abdominal pain, constipation, heartburn, nausea and vomiting. Negative for blood in stool, diarrhea and melena.  Genitourinary: Negative for dysuria and hematuria.  Musculoskeletal: Positive for myalgias. Negative for falls.  Skin: Negative for itching and rash.  Neurological: Negative for speech change, seizures and loss of consciousness.  Endo/Heme/Allergies: Does not bruise/bleed easily.  Psychiatric/Behavioral: Negative for hallucinations and suicidal ideas.    Physical Exam: Vital signs: Vitals:   06/05/16 0021 06/05/16 0542  BP: 131/84 124/89  Pulse: 86 83  Resp: 18 18  Temp: 98.5 F (36.9 C) 98.3 F (36.8 C)   Last BM Date: 06/04/16 General:    Alert,  Well-developed, well-nourished, pleasant and cooperative in NAD HEENT. Normocephalic atraumatic Eyes. Extraocular movement intact. Lid  and conjunctiva normal Anicteric sclera Oral mucosa moist. No lesions Lungs:  Clear throughout to auscultation.   No wheezes, crackles, or rhonchi. No acute distress. Heart:  Regular rate and rhythm; no murmurs, clicks, rubs,  or gallops. Abdomen: Soft, mild epigastric discomfort. No significant tenderness. Nondistended, bowel sounds present Lower extremity. No edema Rectal:  Deferred  GI:  Lab Results:  Recent Labs  06/04/16 1055 06/04/16 1111 06/05/16 0501  WBC 7.3  --  6.2  HGB 13.4 15.0 11.9*  HCT 40.0 44.0 35.0*  PLT 402*  --  284   BMET  Recent Labs  06/04/16 1055 06/04/16 1111 06/05/16 0501  NA 138 139 135  K 4.3 4.4 3.2*  CL 100* 105 104  CO2 20*  --  21*  GLUCOSE 172* 173* 71  BUN 10 9 <5*  CREATININE 0.95 0.80 0.78  CALCIUM 9.2  --  7.6*   LFT  Recent Labs  06/05/16 0501  PROT 6.6  ALBUMIN 3.7  AST 78*  ALT 48  ALKPHOS 47  BILITOT 1.7*   PT/INR No results for input(s): LABPROT, INR in the last 72 hours.   Studies/Results: Ct Abdomen Pelvis W Contrast  Result Date: 06/04/2016 CLINICAL DATA:  Abdominal pain with emesis EXAM: CT ABDOMEN AND PELVIS WITH CONTRAST TECHNIQUE: Multidetector CT imaging of the abdomen and pelvis was performed using the standard protocol following bolus administration of intravenous contrast. CONTRAST:  100mL ISOVUE-300 IOPAMIDOL (ISOVUE-300) INJECTION 61% COMPARISON:  Right upper quadrant ultrasound August 31, 2012 FINDINGS: Lower chest: Lung bases are clear. Hepatobiliary: There is hepatic steatosis. No focal liver lesions are evident. Gallbladder wall is not appreciably thickened. There is no biliary duct dilatation. Pancreas: There is no pancreatic mass or inflammatory focus. Spleen: No splenic lesions are evident. Adrenals/Urinary Tract: Adrenals appear normal bilaterally.  Kidneys bilaterally show no evidence of mass or hydronephrosis on either side. There is no renal or ureteral calculus on either side. Urinary bladder is midline with wall thickness within normal limits. Stomach/Bowel: There are occasional sigmoid diverticula without diverticulitis. There is no appreciable bowel wall or mesenteric thickening. No bowel obstruction. No free air or portal venous air. Vascular/Lymphatic: There is no abdominal aortic aneurysm. No vascular lesions are evident. There is a circumaortic left renal vein, an anatomic variant. There is no adenopathy in the abdomen or pelvis. Reproductive: Uterus is anteverted. There is no pelvic mass or pelvic fluid collection. Other: The appendix appears normal. There is no ascites or abscess in the abdomen or pelvis. Musculoskeletal: There are no blastic or lytic bone lesions. There is mid lumbar dextroscoliosis. There is no intramuscular or abdominal wall lesion. IMPRESSION: Occasional sigmoid diverticula without diverticulitis. No bowel obstruction. No abscess. Appendix region appears unremarkable. No renal or ureteral calculus.  No hydronephrosis. There is hepatic steatosis. A cause for patient's symptoms has not been ascertained with this study. Electronically Signed   By: Bretta BangWilliam  Woodruff III M.D.   On: 06/04/2016 12:37    Impression/Plan: - Coffee-ground  emesis. Mild drop in hemoglobin noted. No melena. ? Gastritis/esophagitis/ulcer disease/Mallory-Weiss tear - Chronic acid reflux since childhood - Elevated LFTs. AST more than ALT. Most likely from alcohol use - Possible alcohol withdrawal  Recommendations -------------------------- - EGD today. Risk benefits discussed with the patient. Verbalized understanding - Continue Protonix. Avoid NSAIDs. - Alcohol abstinence discussed with the patient - CT scan showed fatty liver otherwise no acute changes in the liver. - Further plan based on endoscopic finding    LOS: 0 days   Kathi Der  MD, FACP 06/05/2016, 9:22 AM  Pager [phone] If no answer or after 5 PM call [phone]

## 2016-06-05 NOTE — Progress Notes (Signed)
[facility] PROGRESS NOTE  Susan [name] OYD:[id number] DOB: [birth year] DOA: 06/04/2016   PCP: No PCP Per Patient     Assessment/Plan: Principal Problem:   Acute upper GI bleed Active Problems:   Intractable vomiting with nausea   SIRS (systemic inflammatory response syndrome) (HCC)   Lactic acidosis   Hyperglycemia   Alcohol abuse   Alcohol withdrawal ([place])   35 y.o. female with medical history significant of Previous Tobacco Abuse and EtOH use/abuse and no other real comorbids who presents to [facility] with a cc of N/V and episodes of coffee ground emesis that started last night after she got home from a family Christmas party .EDP was concerned so she was admitted for suspected Upper GI Bleed, Intractable N/V and Concern for active EtOH withdrawal given her drinking history. Received 3 L of normal saline. CT abdomen pelvis unrevealing  Assessment and plan Acute Upper GI Bleed from likely from Alcholic Gastritis/Mallory Weiss Tear from N/V Continue telemetry, low potassium Per EDP drinks vodka -Gastroenterology Consulted by EDP and Dr. Luan Pulling will follow  DC Protonix drip, switched to oral Protonix -NPO for further GI recommendations -Zofran for N/V Hemoglobin stable FOBT negative EGD showed gastritis, continue PPI, biopsy pending  Intractable N/V/Abdominal Pain likely from Alcoholic Gastritis/transaminitis -C/w Zofran for N/V -EtOH Abuse Counseling given  -CT Abd/Pelvis showed Occasional sigmoid diverticula without diverticulitis. No bowel obstruction. No abscess. Appendix region appears unremarkable. No renal or ureteral calculus. No hydronephrosis. There is hepatic steatosis. A cause for patient's symptoms has not been ascertained with this study. -Lipase Level 32    Lactic acidosis-dehydration/alcohol use Resolved   Hyperglycemia -Likely Reactive -Check HbA1c- R/o Gastroparesis  Hepatic Steatosis and Elevated LFT's -Likely from EtOH  Abuse -AST was 149 and ALT 77 , improving -Tylenol Level <10 and EtOH <5 -Repeat CMP in AM  EtOH Abuse with Concern for Active Withdrawal  -UDS was Negative and EtOH was <5 -EtOH Abuse Counseling given -CIWA Protocol with Ativan -Contine MVI, Folic Acid, and Thaimine    DVT prophylaxsis SCDs  Code Status:  Full code    Family Communication: Discussed in detail with the patient, all imaging results, lab results explained to the patient   Disposition Plan:  12/27 if hemoglobin stable     Consultants:  GI    Procedures:  None  Antibiotics: Anti-infectives    None         HPI/Subjective: Still has some intermittent nausea and epigastric pain  Objective: Vitals:   06/04/16 1714 06/05/16 0021 06/05/16 0542 06/05/16 0736  BP: 137/90 131/84 124/89   Pulse: (!) 103 86 83   Resp:  18 18   Temp: 99.5 F (37.5 C) 98.5 F (36.9 C) 98.3 F (36.8 C)   TempSrc: Oral Oral Oral   SpO2: 100% 97% 100%   Weight: 57.9 kg (127 lb 11.2 oz)   58 kg (127 lb 12.8 oz)  Height: 5' 3"  (1.6 m)       Intake/Output Summary (Last 24 hours) at 06/05/16 0841 Last data filed at 06/05/16 0200  Gross per 24 hour  Intake             1140 ml  Output                0 ml  Net             1140 ml    Exam:  Examination:  General exam: Appears calm and comfortable  Respiratory system:  Clear to auscultation. Respiratory effort normal. Cardiovascular system: S1 & S2 heard, RRR. No JVD, murmurs, rubs, gallops or clicks. No pedal edema. Gastrointestinal system: Abdomen is nondistended, soft and nontender. No organomegaly or masses felt. Normal bowel sounds heard. Central nervous system: Alert and oriented. No focal neurological deficits. Extremities: Symmetric 5 x 5 power. Skin: No rashes, lesions or ulcers Psychiatry: Judgement and insight appear normal. Mood & affect appropriate.     Data Reviewed: I have personally reviewed following labs and imaging studies  Micro  Results No results found for this or any previous visit (from the past 240 hour(s)).  Radiology Reports Ct Abdomen Pelvis W Contrast  Result Date: 06/04/2016 CLINICAL DATA:  Abdominal pain with emesis EXAM: CT ABDOMEN AND PELVIS WITH CONTRAST TECHNIQUE: Multidetector CT imaging of the abdomen and pelvis was performed using the standard protocol following bolus administration of intravenous contrast. CONTRAST:  166m ISOVUE-300 IOPAMIDOL (ISOVUE-300) INJECTION 61% COMPARISON:  Right upper quadrant ultrasound August 31, 2012 FINDINGS: Lower chest: Lung bases are clear. Hepatobiliary: There is hepatic steatosis. No focal liver lesions are evident. Gallbladder wall is not appreciably thickened. There is no biliary duct dilatation. Pancreas: There is no pancreatic mass or inflammatory focus. Spleen: No splenic lesions are evident. Adrenals/Urinary Tract: Adrenals appear normal bilaterally. Kidneys bilaterally show no evidence of mass or hydronephrosis on either side. There is no renal or ureteral calculus on either side. Urinary bladder is midline with wall thickness within normal limits. Stomach/Bowel: There are occasional sigmoid diverticula without diverticulitis. There is no appreciable bowel wall or mesenteric thickening. No bowel obstruction. No free air or portal venous air. Vascular/Lymphatic: There is no abdominal aortic aneurysm. No vascular lesions are evident. There is a circumaortic left renal vein, an anatomic variant. There is no adenopathy in the abdomen or pelvis. Reproductive: Uterus is anteverted. There is no pelvic mass or pelvic fluid collection. Other: The appendix appears normal. There is no ascites or abscess in the abdomen or pelvis. Musculoskeletal: There are no blastic or lytic bone lesions. There is mid lumbar dextroscoliosis. There is no intramuscular or abdominal wall lesion. IMPRESSION: Occasional sigmoid diverticula without diverticulitis. No bowel obstruction. No abscess. Appendix  region appears unremarkable. No renal or ureteral calculus.  No hydronephrosis. There is hepatic steatosis. A cause for patient's symptoms has not been ascertained with this study. Electronically Signed   By: WLowella GripIII M.D.   On: 06/04/2016 12:37     CBC  Recent Labs Lab 06/04/16 1055 06/04/16 1111 06/05/16 0501  WBC 7.3  --  6.2  HGB 13.4 15.0 11.9*  HCT 40.0 44.0 35.0*  PLT 402*  --  284  MCV 96.9  --  96.2  MCH 32.4  --  32.7  MCHC 33.5  --  34.0  RDW 14.2  --  13.9    Chemistries   Recent Labs Lab 06/04/16 1055 06/04/16 1111 06/05/16 0501  NA 138 139 135  K 4.3 4.4 3.2*  CL 100* 105 104  CO2 20*  --  21*  GLUCOSE 172* 173* 71  BUN 10 9 <5*  CREATININE 0.95 0.80 0.78  CALCIUM 9.2  --  7.6*  AST 149*  --  78*  ALT 77*  --  48  ALKPHOS 64  --  47  BILITOT 1.8*  --  1.7*   ------------------------------------------------------------------------------------------------------------------ estimated creatinine clearance is 81.2 mL/min (by C-G formula based on SCr of 0.78 mg/dL). ------------------------------------------------------------------------------------------------------------------ No results for input(s): HGBA1C in the last 72 hours. ------------------------------------------------------------------------------------------------------------------  No results for input(s): CHOL, HDL, LDLCALC, TRIG, CHOLHDL, LDLDIRECT in the last 72 hours. ------------------------------------------------------------------------------------------------------------------  Recent Labs  06/04/16 1838  TSH 1.754   ------------------------------------------------------------------------------------------------------------------ No results for input(s): VITAMINB12, FOLATE, FERRITIN, TIBC, IRON, RETICCTPCT in the last 72 hours.  Coagulation profile No results for input(s): INR, PROTIME in the last 168 hours.  No results for input(s): DDIMER in the last 72  hours.  Cardiac Enzymes No results for input(s): CKMB, TROPONINI, MYOGLOBIN in the last 168 hours.  Invalid input(s): CK ------------------------------------------------------------------------------------------------------------------ Invalid input(s): POCBNP   CBG:  Recent Labs Lab 06/05/16 0731  GLUCAP 76       Studies: Ct Abdomen Pelvis W Contrast  Result Date: 06/04/2016 CLINICAL DATA:  Abdominal pain with emesis EXAM: CT ABDOMEN AND PELVIS WITH CONTRAST TECHNIQUE: Multidetector CT imaging of the abdomen and pelvis was performed using the standard protocol following bolus administration of intravenous contrast. CONTRAST:  126m ISOVUE-300 IOPAMIDOL (ISOVUE-300) INJECTION 61% COMPARISON:  Right upper quadrant ultrasound August 31, 2012 FINDINGS: Lower chest: Lung bases are clear. Hepatobiliary: There is hepatic steatosis. No focal liver lesions are evident. Gallbladder wall is not appreciably thickened. There is no biliary duct dilatation. Pancreas: There is no pancreatic mass or inflammatory focus. Spleen: No splenic lesions are evident. Adrenals/Urinary Tract: Adrenals appear normal bilaterally. Kidneys bilaterally show no evidence of mass or hydronephrosis on either side. There is no renal or ureteral calculus on either side. Urinary bladder is midline with wall thickness within normal limits. Stomach/Bowel: There are occasional sigmoid diverticula without diverticulitis. There is no appreciable bowel wall or mesenteric thickening. No bowel obstruction. No free air or portal venous air. Vascular/Lymphatic: There is no abdominal aortic aneurysm. No vascular lesions are evident. There is a circumaortic left renal vein, an anatomic variant. There is no adenopathy in the abdomen or pelvis. Reproductive: Uterus is anteverted. There is no pelvic mass or pelvic fluid collection. Other: The appendix appears normal. There is no ascites or abscess in the abdomen or pelvis. Musculoskeletal: There  are no blastic or lytic bone lesions. There is mid lumbar dextroscoliosis. There is no intramuscular or abdominal wall lesion. IMPRESSION: Occasional sigmoid diverticula without diverticulitis. No bowel obstruction. No abscess. Appendix region appears unremarkable. No renal or ureteral calculus.  No hydronephrosis. There is hepatic steatosis. A cause for patient's symptoms has not been ascertained with this study. Electronically Signed   By: WLowella GripIII M.D.   On: 06/04/2016 12:37      No results found for: HGBA1C Lab Results  Component Value Date   CREATININE 0.78 06/05/2016       Scheduled Meds: . folic acid  1 mg Oral Daily  . LORazepam  0-4 mg Intravenous Q6H   Followed by  . [START ON 06/06/2016] LORazepam  0-4 mg Intravenous Q12H  . LORazepam  0-4 mg Intravenous Q6H   Followed by  . [START ON 06/06/2016] LORazepam  0-4 mg Intravenous Q12H  . multivitamin with minerals  1 tablet Oral Daily  . pantoprazole  40 mg Oral Daily  . thiamine  100 mg Oral Daily   Or  . thiamine  100 mg Intravenous Daily   Continuous Infusions: . 0.9 % NaCl with KCl 40 mEq / L       LOS: 0 days    Time spent: >30 MINS    [facility] Pager [phone] If 7PM-7AM, please contact night-coverage at [email or web address], password [email or web address]12/27/2017, 8:41 AM  LOS: 0 days

## 2016-06-05 NOTE — Transfer of Care (Signed)
Immediate Anesthesia Transfer of Care Note  Patient: Susan [name]  Procedure(s) Performed: Procedure(s): ESOPHAGOGASTRODUODENOSCOPY (EGD) WITH PROPOFOL (N/A)  Patient Location: PACU  Anesthesia Type:MAC  Level of Consciousness: sedated, patient cooperative and responds to stimulation  Airway & Oxygen Therapy: Patient Spontanous Breathing and Patient connected to nasal cannula oxygen  Post-op Assessment: Report given to RN and Post -op Vital signs reviewed and stable  Post vital signs: Reviewed and stable  Last Vitals:  Vitals:   06/05/16 0542 06/05/16 1143  BP: 124/89 (!) 148/98  Pulse: 83 81  Resp: 18 16  Temp: 36.8 C 36.8 C    Last Pain:  Vitals:   06/05/16 1143  TempSrc: Oral  PainSc: 3       Patients Stated Pain Goal: 3 (06/05/16 0915)  Complications: No apparent anesthesia complications

## 2016-06-05 NOTE — Brief Op Note (Signed)
06/04/2016 - 06/05/2016  12:51 PM  PATIENT:  Susan [name]  35 y.o. female  PRE-OPERATIVE DIAGNOSIS:  gib  POST-OPERATIVE DIAGNOSIS:  Gastritis/ gastrophy  PROCEDURE:  Procedure(s): ESOPHAGOGASTRODUODENOSCOPY (EGD) WITH PROPOFOL (N/A)  SURGEON:  Surgeon(s) and Role:    * Kathi Der, MD - Primary  Recommendations --------------------------- - Advance diet as tolerated - Follow biopsy results - Once a day PPI - Follow-up in GI clinic in 6 weeks for abnormal LFTs - Avoid alcohol use - GI will sign off. Call us back if needed

## 2016-06-05 NOTE — Anesthesia Postprocedure Evaluation (Signed)
Anesthesia Post Note  Patient: Susan [name]  Procedure(s) Performed: Procedure(s) (LRB): ESOPHAGOGASTRODUODENOSCOPY (EGD) WITH PROPOFOL (N/A)  Patient location during evaluation: Endoscopy Anesthesia Type: MAC Level of consciousness: awake and alert Pain management: pain level controlled Vital Signs Assessment: post-procedure vital signs reviewed and stable Respiratory status: spontaneous breathing, nonlabored ventilation, respiratory function stable and patient connected to nasal cannula oxygen Cardiovascular status: stable and blood pressure returned to baseline Anesthetic complications: no       Last Vitals:  Vitals:   06/05/16 1310 06/05/16 1334  BP: (!) 134/94 (!) 135/91  Pulse: 72 74  Resp: 15 16  Temp:  36.7 C    Last Pain:  Vitals:   06/05/16 1334  TempSrc: Oral  PainSc:                  Phillips Grout

## 2016-06-05 NOTE — Anesthesia Preprocedure Evaluation (Signed)
Anesthesia Evaluation  Patient identified by MRN, date of birth, ID band Patient awake    Reviewed: Allergy & Precautions, NPO status , Patient's Chart, lab work & pertinent test results  Airway Mallampati: II  TM Distance: >3 FB Neck ROM: Full    Dental no notable dental hx.    Pulmonary former smoker,    Pulmonary exam normal breath sounds clear to auscultation       Cardiovascular negative cardio ROS Normal cardiovascular exam Rhythm:Regular Rate:Normal     Neuro/Psych negative neurological ROS  negative psych ROS   GI/Hepatic (+)     substance abuse  alcohol use, Upper GI bleed   Endo/Other  negative endocrine ROS  Renal/GU negative Renal ROS  negative genitourinary   Musculoskeletal negative musculoskeletal ROS (+)   Abdominal   Peds negative pediatric ROS (+)  Hematology negative hematology ROS (+)   Anesthesia Other Findings   Reproductive/Obstetrics negative OB ROS                             Anesthesia Physical Anesthesia Plan  ASA: II  Anesthesia Plan: MAC   Post-op Pain Management:    Induction: Intravenous  Airway Management Planned: Nasal Cannula  Additional Equipment:   Intra-op Plan:   Post-operative Plan: Extubation in OR  Informed Consent: I have reviewed the patients History and Physical, chart, labs and discussed the procedure including the risks, benefits and alternatives for the proposed anesthesia with the patient or authorized representative who has indicated his/her understanding and acceptance.   Dental advisory given  Plan Discussed with: CRNA  Anesthesia Plan Comments:         Anesthesia Quick Evaluation

## 2016-06-05 NOTE — Op Note (Signed)
[facility] Patient Name: Susan [name] Procedure Date: 06/05/2016 MRN: [id number] Attending MD: Kathi Der , MD Date of Birth: 04/07/1981 CSN: [id number] Age: 35 Admit Type: Inpatient Procedure:                Upper GI endoscopy Indications:              Coffee-ground emesis Providers:                Kathi Der, MD, Anthony Sar, RN, Darletta Moll Tech, Technician, Mirian Mo, CRNA Referring MD:              Medicines:                Sedation Administered by an Anesthesia Professional Complications:            No immediate complications. Estimated Blood Loss:     Estimated blood loss: none. Procedure:                Pre-Anesthesia Assessment:                           - Prior to the procedure, a History and Physical                            was performed, and patient medications and                            allergies were reviewed. The patient's tolerance of                            previous anesthesia was also reviewed. The risks                            and benefits of the procedure and the sedation                            options and risks were discussed with the patient.                            All questions were answered, and informed consent                            was obtained. Prior Anticoagulants: The patient has                            taken no previous anticoagulant or antiplatelet                            agents. ASA Grade Assessment: II - A patient with                            mild systemic disease. After reviewing the risks  and benefits, the patient was deemed in                            satisfactory condition to undergo the procedure.                           After obtaining informed consent, the endoscope was                            passed under direct vision. Throughout the                            procedure, the patient's blood pressure, pulse,  and                            oxygen saturations were monitored continuously. The                            [id number] ([id number]) scope was introduced through the                            mouth, and advanced to the second part of duodenum.                            The upper GI endoscopy was accomplished with ease.                            The patient tolerated the procedure well. Findings:      The Z-line was regular and was found 38 cm from the incisors.      The exam of the esophagus was otherwise normal.      Diffuse severe inflammation characterized by congestion (edema),       erythema, friability and granularity was found in the entire examined       stomach. Biopsies were taken with a cold forceps for histology.      The cardia and gastric fundus were normal on retroflexion. small amount       of residual food material noted.      The duodenal bulb, first portion of the duodenum and second portion of       the duodenum were normal. Impression:               - Z-line regular, 38 cm from the incisors.                           - Gastritis. Biopsied.                           - Normal duodenal bulb, first portion of the                            duodenum and second portion of the duodenum. Moderate Sedation:      Moderate (conscious) sedation was personally administered by an       anesthesia professional. The following parameters were monitored: oxygen       saturation, heart rate, blood  pressure, and response to care. Recommendation:           - Return patient to hospital ward for ongoing care.                           - Resume regular diet.                           - Use Protonix (pantoprazole) 40 mg PO daily.                           - Continue present medications. Procedure Code(s):        --- Professional ---                           [phone], Esophagogastroduodenoscopy, flexible,                            transoral; with biopsy, single or multiple Diagnosis Code(s):         --- Professional ---                           K29.70, Gastritis, unspecified, without bleeding                           K92.0, Hematemesis CPT copyright 2016 [employer] Medical Association. All rights reserved. The codes documented in this report are preliminary and upon coder review may  be revised to meet current compliance requirements. Kathi Der, MD Kathi Der, MD 06/05/2016 12:51:31 PM Number of Addenda: 0

## 2016-06-06 ENCOUNTER — Encounter (HOSPITAL_COMMUNITY): Payer: Self-pay | Admitting: Gastroenterology

## 2016-06-06 LAB — HEMOGLOBIN A1C
Hgb A1c MFr Bld: 5.1 % (ref 4.8–5.6)
Mean Plasma Glucose: 100 mg/dL

## 2016-06-06 NOTE — Discharge Summary (Signed)
Physician Discharge Summary  CHANNEL [name] MRN: [id number] DOB/AGE: [birth year] 35 y.o.  PCP: No PCP Per Patient   Admit date: 06/04/2016 Discharge date: 06/06/2016  Discharge Diagnoses:    Principal Problem:   Acute upper GI bleed Active Problems:   Intractable vomiting with nausea   SIRS (systemic inflammatory response syndrome) (HCC)   Lactic acidosis   Hyperglycemia   Alcohol abuse   Alcohol withdrawal ([place])    Follow-up recommendations Follow-up with PCP in 3-5 days , including all  additional recommended appointments as below Follow-up CBC, CMP in 3-5 days  Follow-up in GI clinic in 6 weeks for abnormal LFTs, to discuss results of biopsy - Avoid alcohol use     Discharge Medication List as of 06/05/2016  2:17 PM    START taking these medications   Details  folic acid (FOLVITE) 1 MG tablet Take 1 tablet (1 mg total) by mouth daily., Starting Thu 06/06/2016, Normal    sucralfate (CARAFATE) 1 GM/10ML suspension Take 10 mLs (1 g total) by mouth 4 (four) times daily -  with meals and at bedtime., Starting Wed 06/05/2016, Normal    thiamine 100 MG tablet Take 1 tablet (100 mg total) by mouth daily., Starting Thu 06/06/2016, Normal      CONTINUE these medications which have NOT CHANGED   Details  acetaminophen (TYLENOL) 325 MG tablet Take 650 mg by mouth every 6 (six) hours as needed., Historical Med    Cyanocobalamin (B-12 PO) Take 1 tablet by mouth daily., Historical Med    esomeprazole (NEXIUM) 20 MG capsule Take 20 mg by mouth daily at 12 noon., Historical Med    ferrous sulfate 325 (65 FE) MG tablet Take 325 mg by mouth daily with breakfast., Historical Med    Multiple Vitamin (MULTIVITAMIN WITH MINERALS) TABS tablet Take 2 tablets by mouth daily., Historical Med         Discharge Condition: Stable   Discharge Instructions Get Medicines reviewed and adjusted: Please take all your medications with you for your next visit with your Primary  MD  Please request your Primary MD to go over all hospital tests and procedure/radiological results at the follow up, please ask your Primary MD to get all Hospital records sent to his/her office.  If you experience worsening of your admission symptoms, develop shortness of breath, life threatening emergency, suicidal or homicidal thoughts you must seek medical attention immediately by calling 911 or calling your MD immediately if symptoms less severe.  You must read complete instructions/literature along with all the possible adverse reactions/side effects for all the Medicines you take and that have been prescribed to you. Take any new Medicines after you have completely understood and accpet all the possible adverse reactions/side effects.   Do not drive when taking Pain medications.   Do not take more than prescribed Pain, Sleep and Anxiety Medications  Special Instructions: If you have smoked or chewed Tobacco in the last 2 yrs please stop smoking, stop any regular Alcohol and or any Recreational drug use.  Wear Seat belts while driving.  Please note  You were cared for by a hospitalist during your hospital stay. Once you are discharged, your primary care physician will handle any further medical issues. Please note that NO REFILLS for any discharge medications will be authorized once you are discharged, as it is imperative that you return to your primary care physician (or establish a relationship with a primary care physician if you do not have one) for your aftercare  needs so that they can reassess your need for medications and monitor your lab values.  Discharge Instructions    Diet - low sodium heart healthy    Complete by:  As directed    Increase activity slowly    Complete by:  As directed        No Known Allergies    Disposition: 01-Home or Self Care   Consults:  GI     Significant Diagnostic Studies:  Ct Abdomen Pelvis W Contrast  Result Date:  06/04/2016 CLINICAL DATA:  Abdominal pain with emesis EXAM: CT ABDOMEN AND PELVIS WITH CONTRAST TECHNIQUE: Multidetector CT imaging of the abdomen and pelvis was performed using the standard protocol following bolus administration of intravenous contrast. CONTRAST:  159m ISOVUE-300 IOPAMIDOL (ISOVUE-300) INJECTION 61% COMPARISON:  Right upper quadrant ultrasound August 31, 2012 FINDINGS: Lower chest: Lung bases are clear. Hepatobiliary: There is hepatic steatosis. No focal liver lesions are evident. Gallbladder wall is not appreciably thickened. There is no biliary duct dilatation. Pancreas: There is no pancreatic mass or inflammatory focus. Spleen: No splenic lesions are evident. Adrenals/Urinary Tract: Adrenals appear normal bilaterally. Kidneys bilaterally show no evidence of mass or hydronephrosis on either side. There is no renal or ureteral calculus on either side. Urinary bladder is midline with wall thickness within normal limits. Stomach/Bowel: There are occasional sigmoid diverticula without diverticulitis. There is no appreciable bowel wall or mesenteric thickening. No bowel obstruction. No free air or portal venous air. Vascular/Lymphatic: There is no abdominal aortic aneurysm. No vascular lesions are evident. There is a circumaortic left renal vein, an anatomic variant. There is no adenopathy in the abdomen or pelvis. Reproductive: Uterus is anteverted. There is no pelvic mass or pelvic fluid collection. Other: The appendix appears normal. There is no ascites or abscess in the abdomen or pelvis. Musculoskeletal: There are no blastic or lytic bone lesions. There is mid lumbar dextroscoliosis. There is no intramuscular or abdominal wall lesion. IMPRESSION: Occasional sigmoid diverticula without diverticulitis. No bowel obstruction. No abscess. Appendix region appears unremarkable. No renal or ureteral calculus.  No hydronephrosis. There is hepatic steatosis. A cause for patient's symptoms has not been  ascertained with this study. Electronically Signed   By: WLowella GripIII M.D.   On: 06/04/2016 12:37       Filed Weights   06/04/16 1714 06/05/16 0736 06/05/16 1143  Weight: 57.9 kg (127 lb 11.2 oz) 58 kg (127 lb 12.8 oz) 57.6 kg (127 lb)     Microbiology: No results found for this or any previous visit (from the past 240 hour(s)).     Blood Culture No results found for: SDES, [place]    Labs: Results for orders placed or performed during the hospital encounter of 06/04/16 (from the past 48 hour(s))  TSH     Status: None   Collection Time: 06/04/16  6:38 PM  Result Value Ref Range   TSH 1.754 0.350 - 4.500 uIU/mL    Comment: Performed by a 3rd Generation assay with a functional sensitivity of <=0.01 uIU/mL.  Hemoglobin A1c     Status: None   Collection Time: 06/04/16  6:38 PM  Result Value Ref Range   Hgb A1c MFr Bld 5.1 4.8 - 5.6 %    Comment: (NOTE)         Pre-diabetes: 5.7 - 6.4         Diabetes: >6.4         Glycemic control for adults with diabetes: <7.0  Mean Plasma Glucose 100 mg/dL    Comment: (NOTE) Performed At: [facility] [place], [place] [id number] Lindon Romp MD [id number]   Comprehensive metabolic panel     Status: Abnormal   Collection Time: 06/05/16  5:01 AM  Result Value Ref Range   Sodium 135 135 - 145 mmol/L   Potassium 3.2 (L) 3.5 - 5.1 mmol/L    Comment: DELTA CHECK NOTED   Chloride 104 101 - 111 mmol/L   CO2 21 (L) 22 - 32 mmol/L   Glucose, Bld 71 65 - 99 mg/dL   BUN <5 (L) 6 - 20 mg/dL   Creatinine, Ser 0.78 0.44 - 1.00 mg/dL   Calcium 7.6 (L) 8.9 - 10.3 mg/dL   Total Protein 6.6 6.5 - 8.1 g/dL   Albumin 3.7 3.5 - 5.0 g/dL   AST 78 (H) 15 - 41 U/L   ALT 48 14 - 54 U/L   Alkaline Phosphatase 47 38 - 126 U/L   Total Bilirubin 1.7 (H) 0.3 - 1.2 mg/dL   GFR calc non Af Amer >60 >60 mL/min   GFR calc Af Amer >60 >60 mL/min    Comment: (NOTE) The eGFR has been calculated  using the CKD EPI equation. This calculation has not been validated in all clinical situations. eGFR's persistently <60 mL/min signify possible Chronic Kidney Disease.    Anion gap 10 5 - 15  CBC     Status: Abnormal   Collection Time: 06/05/16  5:01 AM  Result Value Ref Range   WBC 6.2 4.0 - 10.5 K/uL   RBC 3.64 (L) 3.87 - 5.11 MIL/uL   Hemoglobin 11.9 (L) 12.0 - 15.0 g/dL    Comment: DELTA CHECK NOTED REPEATED TO VERIFY    HCT 35.0 (L) 36.0 - 46.0 %   MCV 96.2 78.0 - 100.0 fL   MCH 32.7 26.0 - 34.0 pg   MCHC 34.0 30.0 - 36.0 g/dL   RDW 13.9 11.5 - 15.5 %   Platelets 284 150 - 400 K/uL  Glucose, capillary     Status: None   Collection Time: 06/05/16  7:31 AM  Result Value Ref Range   Glucose-Capillary 76 65 - 99 mg/dL     Lipid Panel  No results found for: CHOL, TRIG, HDL, CHOLHDL, VLDL, LDLCALC, LDLDIRECT      35 y.o.femalewith medical history significant of Previous Tobacco Abuse and EtOH use/abuse and no other real comorbids who presents to [facility] with a cc of N/V and episodes of coffee ground emesis that started last night after she got home from a family Christmas party .EDP was concerned so she was admitted for suspected Upper GI Bleed, Intractable N/V and Concern for active EtOH withdrawal given her drinking history. Received 3 L of normal saline. CT abdomen pelvis unrevealing  Hospital course Acute Upper GI Bleed from likely from Alcholic Gastritis/Mallory Weiss Tear from N/V Admitted to telemetry due to low potassium Per EDP drinks vodka -Gastroenterology Consulted by EDP and Dr. Luan Pulling performed EGD Initially placed on Protonix drip, switched to oral Protonix -Zofran for N/V Hemoglobin stable FOBT negative EGD showed gastritis, continue PPI, biopsy pending, patient to follow-up with GI in 4-6 weeks  Intractable N/V/Abdominal Pain likely from Alcoholic Gastritis/transaminitis -C/w Zofran for N/V -EtOH Abuse Counseling given  -CT Abd/Pelvis showed  Occasional sigmoid diverticula without diverticulitis. No bowel obstruction. No abscess. Appendix region appears unremarkable. No renal or ureteral calculus. No hydronephrosis. There is hepatic steatosis. A cause for patient's symptoms  has not been ascertained with this study. -Lipase Level 32    Lactic acidosis-dehydration/alcohol use Resolved   Hyperglycemia -Likely Reactive    Hepatic Steatosis and Elevated LFT's -Likely from EtOH Abuse -AST was 149 and ALT 77 , improving on the day of discharge -Tylenol Level <10 and EtOH <5    EtOH Abuse with Concern for Active Withdrawal -UDS was Negative and EtOH was <5 -EtOH Abuse Counseling given -CIWA Protocol with Ativan -Contine MVI, Folic Acid, and Thaimine   Discharge Exam:   Blood pressure (!) 135/91, pulse 74, temperature 98 F (36.7 C), temperature source Oral, resp. rate 16, height 5' 3"  (1.6 m), weight 57.6 kg (127 lb), last menstrual period 05/21/2016, SpO2 100 %.      Follow-up Information    Please use the resources provided to you in emergency room by case manager to assist you're your choice of doctor for follow up. Schedule an appointment as soon as possible for a visit.   Contact information: These [place] uninsured resources provide possible primary care providers, resources for discounted medications, housing, dental resources, affordable care act information, plus other resources for [employer]. Call in 1 day(s).   Why:  hospital follow up       Otis Brace, MD. Call.   Specialty:  Gastroenterology Why:  follow up in 6 weeks Contact information: [place] [address] [phone]        Ermalene Postin, MD .   Specialty:  Family Medicine Contact information: [address] [place] [address] [place] [address] Livonia Bromm, DO .   Specialty:  Pediatrics Contact information: [place]  [address] [phone]           Signed: Reyne [name] 06/06/2016, 5:43 PM        Time spent >45 mins

## 2016-09-04 ENCOUNTER — Emergency Department (HOSPITAL_COMMUNITY): Payer: Self-pay

## 2016-09-04 ENCOUNTER — Inpatient Hospital Stay (HOSPITAL_COMMUNITY)
Admission: EM | Admit: 2016-09-04 | Discharge: 2016-09-09 | DRG: 439 | Disposition: A | Discharge status: Home / Self Care | Payer: Self-pay | Attending: Internal Medicine | Admitting: Internal Medicine

## 2016-09-04 ENCOUNTER — Other Ambulatory Visit (HOSPITAL_COMMUNITY): Payer: Self-pay

## 2016-09-04 ENCOUNTER — Encounter (HOSPITAL_COMMUNITY): Payer: Self-pay

## 2016-09-04 ENCOUNTER — Other Ambulatory Visit: Payer: Self-pay

## 2016-09-04 DIAGNOSIS — D72829 Elevated white blood cell count, unspecified: Secondary | ICD-10-CM

## 2016-09-04 DIAGNOSIS — E872 Acidosis, unspecified: Secondary | ICD-10-CM | POA: Diagnosis present

## 2016-09-04 DIAGNOSIS — R109 Unspecified abdominal pain: Secondary | ICD-10-CM

## 2016-09-04 DIAGNOSIS — E875 Hyperkalemia: Secondary | ICD-10-CM | POA: Diagnosis present

## 2016-09-04 DIAGNOSIS — R000 Tachycardia, unspecified: Secondary | ICD-10-CM

## 2016-09-04 DIAGNOSIS — R112 Nausea with vomiting, unspecified: Secondary | ICD-10-CM | POA: Diagnosis present

## 2016-09-04 DIAGNOSIS — F10231 Alcohol dependence with withdrawal delirium: Secondary | ICD-10-CM | POA: Diagnosis present

## 2016-09-04 DIAGNOSIS — N179 Acute kidney failure, unspecified: Secondary | ICD-10-CM | POA: Diagnosis present

## 2016-09-04 DIAGNOSIS — Z79899 Other long term (current) drug therapy: Secondary | ICD-10-CM

## 2016-09-04 DIAGNOSIS — K852 Alcohol induced acute pancreatitis without necrosis or infection: Secondary | ICD-10-CM | POA: Diagnosis present

## 2016-09-04 DIAGNOSIS — K920 Hematemesis: Secondary | ICD-10-CM | POA: Diagnosis present

## 2016-09-04 DIAGNOSIS — F10239 Alcohol dependence with withdrawal, unspecified: Secondary | ICD-10-CM | POA: Diagnosis present

## 2016-09-04 DIAGNOSIS — F1010 Alcohol abuse, uncomplicated: Secondary | ICD-10-CM | POA: Diagnosis present

## 2016-09-04 DIAGNOSIS — I4581 Long QT syndrome: Secondary | ICD-10-CM | POA: Diagnosis present

## 2016-09-04 DIAGNOSIS — D62 Acute posthemorrhagic anemia: Secondary | ICD-10-CM | POA: Diagnosis present

## 2016-09-04 DIAGNOSIS — K859 Acute pancreatitis without necrosis or infection, unspecified: Principal | ICD-10-CM | POA: Diagnosis present

## 2016-09-04 DIAGNOSIS — E876 Hypokalemia: Secondary | ICD-10-CM | POA: Diagnosis not present

## 2016-09-04 DIAGNOSIS — A419 Sepsis, unspecified organism: Secondary | ICD-10-CM | POA: Diagnosis present

## 2016-09-04 DIAGNOSIS — R6510 Systemic inflammatory response syndrome (SIRS) of non-infectious origin without acute organ dysfunction: Secondary | ICD-10-CM | POA: Diagnosis present

## 2016-09-04 DIAGNOSIS — R740 Nonspecific elevation of levels of transaminase and lactic acid dehydrogenase [LDH]: Secondary | ICD-10-CM

## 2016-09-04 DIAGNOSIS — F10931 Alcohol use, unspecified with withdrawal delirium: Secondary | ICD-10-CM | POA: Diagnosis present

## 2016-09-04 DIAGNOSIS — E8729 Other acidosis: Secondary | ICD-10-CM

## 2016-09-04 DIAGNOSIS — K922 Gastrointestinal hemorrhage, unspecified: Secondary | ICD-10-CM | POA: Diagnosis present

## 2016-09-04 DIAGNOSIS — R7401 Elevation of levels of liver transaminase levels: Secondary | ICD-10-CM

## 2016-09-04 DIAGNOSIS — Z87891 Personal history of nicotine dependence: Secondary | ICD-10-CM

## 2016-09-04 DIAGNOSIS — D649 Anemia, unspecified: Secondary | ICD-10-CM | POA: Diagnosis not present

## 2016-09-04 HISTORY — DX: Gastritis, unspecified, without bleeding: K29.70

## 2016-09-04 MED ORDER — LORAZEPAM 2 MG/ML IJ SOLN
0.0000 mg | Freq: Four times a day (QID) | INTRAMUSCULAR | Status: DC
  Administered 2016-09-05: 2 mg via INTRAVENOUS
  Administered 2016-09-05 (×2): 1 mg via INTRAVENOUS
  Filled 2016-09-04 (×3): qty 1

## 2016-09-04 MED ORDER — VITAMIN B-1 100 MG PO TABS: 100.0000 mg | ORAL_TABLET | Freq: Every day | ORAL | Status: DC

## 2016-09-04 MED ORDER — LORAZEPAM 2 MG/ML IJ SOLN
2.0000 mg | Freq: Once | INTRAMUSCULAR | Status: AC
  Administered 2016-09-04: 2 mg via INTRAVENOUS
  Filled 2016-09-04: qty 1

## 2016-09-04 MED ORDER — METOCLOPRAMIDE HCL 5 MG/ML IJ SOLN
10.0000 mg | Freq: Once | INTRAMUSCULAR | Status: AC
  Administered 2016-09-04: 10 mg via INTRAVENOUS
  Filled 2016-09-04: qty 2

## 2016-09-04 MED ORDER — SODIUM CHLORIDE 0.9 % IV SOLN
8.0000 mg/h | INTRAVENOUS | Status: DC
  Administered 2016-09-05 – 2016-09-06 (×4): 8 mg/h via INTRAVENOUS
  Filled 2016-09-04 (×8): qty 80

## 2016-09-04 MED ORDER — HYDROMORPHONE HCL 1 MG/ML IJ SOLN
0.5000 mg | Freq: Once | INTRAMUSCULAR | Status: AC
Start: 2016-09-04 — End: 2016-09-05
  Administered 2016-09-05: 0.5 mg via INTRAVENOUS
  Filled 2016-09-04: qty 1

## 2016-09-04 MED ORDER — THIAMINE HCL 100 MG/ML IJ SOLN
100.0000 mg | Freq: Once | INTRAMUSCULAR | Status: AC
  Administered 2016-09-05: 100 mg via INTRAVENOUS
  Filled 2016-09-04: qty 2

## 2016-09-04 MED ORDER — THIAMINE HCL 100 MG/ML IJ SOLN
100.0000 mg | Freq: Every day | INTRAMUSCULAR | Status: DC
  Administered 2016-09-05 – 2016-09-06 (×2): 100 mg via INTRAVENOUS
  Filled 2016-09-04 (×2): qty 2

## 2016-09-04 MED ORDER — PANTOPRAZOLE SODIUM 40 MG IV SOLR
40.0000 mg | Freq: Once | INTRAVENOUS | Status: AC
  Administered 2016-09-05: 40 mg via INTRAVENOUS
  Filled 2016-09-04: qty 40

## 2016-09-04 MED ORDER — LORAZEPAM 2 MG/ML IJ SOLN: 0.0000 mg | Freq: Two times a day (BID) | INTRAMUSCULAR | Status: DC

## 2016-09-04 MED ORDER — SODIUM CHLORIDE 0.9 % IV BOLUS (SEPSIS)
1000.0000 mL | Freq: Once | INTRAVENOUS | Status: AC
  Administered 2016-09-04: 1000 mL via INTRAVENOUS

## 2016-09-04 MED ORDER — SODIUM CHLORIDE 0.9 % IV BOLUS (SEPSIS)
1000.0000 mL | Freq: Once | INTRAVENOUS | Status: AC
  Administered 2016-09-05: 1000 mL via INTRAVENOUS

## 2016-09-04 NOTE — ED Provider Notes (Signed)
[facility] DEPT Provider Note   CSN: [id number] Arrival date & time: 09/04/16  2311     History   Chief Complaint Chief Complaint  Patient presents with  . Abdominal Pain    HPI Susan [name] is a 36 y.o. female who presents emergency Department with chief complaint of hematemesis. Patient is accompanied by one of our ER attending physicians. He knows her well, as well as her mother. According to the patient. She has episodes of cyclic nausea and vomiting that she gets around her. Since onset of menstruation. She is also under significant stress at this time and recently separated from her husband, living with her mother. According to the patient she began having vomiting yesterday and has had innumerable amounts of vomiting since then, which has been intractable. Patient has been able to tolerate ice chips, but vomits when she tries to drink or eat foods. 2. Yesterday the patient began having dark vomitus, however, today she noticed black coffee ground emesis. According to her physician friend. He is concerned about alcohol dependence and thinks that this plays a large part in her symptoms today and she is likely in withdraw. Patient does admit to heavy or drinking been normal, but does not quantify the amount. She is also complaining of severe epigastric pain and continued nausea despite being given Zofran prior to arrival by EMS. She has pain with deep breathing, but denies chest pain.  HPI  Past Medical History:  Diagnosis Date  . Gastritis     Patient Active Problem List   Diagnosis Date Noted  . Intractable vomiting with nausea 06/04/2016  . Acute upper GI bleed 06/04/2016  . SIRS (systemic inflammatory response syndrome) ([place]) 06/04/2016  . Lactic acidosis 06/04/2016  . Hyperglycemia 06/04/2016  . Alcohol abuse 06/04/2016  . Alcohol withdrawal ([place]) 06/04/2016    Past Surgical History:  Procedure Laterality Date  . ESOPHAGOGASTRODUODENOSCOPY (EGD) WITH PROPOFOL  N/A 06/05/2016   Procedure: ESOPHAGOGASTRODUODENOSCOPY (EGD) WITH PROPOFOL;  Surgeon: Otis Brace, MD;  Location: [facility];  Service: Gastroenterology;  Laterality: N/A;    OB History    No data available       Home Medications    Prior to Admission medications   Medication Sig Start Date End Date Taking? Authorizing Provider  acetaminophen (TYLENOL) 325 MG tablet Take 650 mg by mouth every 6 (six) hours as needed.    Historical Provider, MD  Cyanocobalamin (B-12 PO) Take 1 tablet by mouth daily.    Historical Provider, MD  esomeprazole (NEXIUM) 20 MG capsule Take 20 mg by mouth daily at 12 noon.    Historical Provider, MD  ferrous sulfate 325 (65 FE) MG tablet Take 325 mg by mouth daily with breakfast.    Historical Provider, MD  folic acid (FOLVITE) 1 MG tablet Take 1 tablet (1 mg total) by mouth daily. 06/06/16   Reyne Dumas, MD  Multiple Vitamin (MULTIVITAMIN WITH MINERALS) TABS tablet Take 2 tablets by mouth daily.    Historical Provider, MD  sucralfate (CARAFATE) 1 GM/10ML suspension Take 10 mLs (1 g total) by mouth 4 (four) times daily -  with meals and at bedtime. 06/05/16   Reyne Dumas, MD  thiamine 100 MG tablet Take 1 tablet (100 mg total) by mouth daily. 06/06/16   Reyne Dumas, MD    Family History No family history on file.  Social History Social History  Substance Use Topics  . Smoking status: Former Research scientist (life sciences)  . Smokeless tobacco: Never Used  . Alcohol use  Yes     Comment: occasional     Allergies   Patient has no known allergies.   Review of Systems Review of Systems Ten systems reviewed and are negative for acute change, except as noted in the HPI.    Physical Exam Updated Vital Signs BP (!) 139/99 (BP Location: Left Arm)   Pulse (!) 154   Resp (!) 24   LMP 09/04/2016   SpO2 100%   Physical Exam  Constitutional: She is oriented to person, place, and time. She appears well-developed and well-nourished. No distress.  Patient is  diaphoretic. Appears extremely uncomfortable. Patient noted to be tachycardic and hypertensive.  HENT:  Head: Normocephalic and atraumatic.  Eyes: Conjunctivae are normal. No scleral icterus.  Neck: Normal range of motion.  Cardiovascular: Normal rate, regular rhythm and normal heart sounds.  Exam reveals no gallop and no friction rub.   No murmur heard. Pulmonary/Chest: Effort normal and breath sounds normal. No respiratory distress.  Abdominal: Soft. Bowel sounds are normal. She exhibits no distension and no mass. There is tenderness. There is no guarding.  Neurological: She is alert and oriented to person, place, and time.  Skin: Skin is warm. She is diaphoretic.  Psychiatric:  Anxious  Nursing note and vitals reviewed.    ED Treatments / Results  Labs (all labs ordered are listed, but only abnormal results are displayed) Labs Reviewed  AMMONIA  COMPREHENSIVE METABOLIC PANEL  CBC WITH DIFFERENTIAL/PLATELET  LIPASE, BLOOD  APTT  PROTIME-INR  I-STAT CHEM 8, ED  I-STAT BETA HCG BLOOD, ED ([facility], [facility], [facility] ONLY)    EKG  EKG Interpretation None       Radiology No results found.  Procedures .Critical Care Performed by: Margarita Mail Authorized by: Margarita Mail   Critical care provider statement:    Critical care was necessary to treat or prevent imminent or life-threatening deterioration of the following conditions:  Metabolic crisis and sepsis   Critical care was time spent personally by me on the following activities:  Discussions with consultants, discussions with primary provider, evaluation of patient's response to treatment, examination of patient, interpretation of cardiac output measurements, obtaining history from patient or surrogate, review of old charts, re-evaluation of patient's condition, pulse oximetry, ordering and review of radiographic studies, ordering and review of laboratory studies and ordering and performing treatments and interventions     (including critical care time)  Medications Ordered in ED Medications  sodium chloride 0.9 % bolus 1,000 mL (not administered)  thiamine (B-1) injection 100 mg (not administered)  LORazepam (ATIVAN) injection 0-4 mg (not administered)    Followed by  LORazepam (ATIVAN) injection 0-4 mg (not administered)  thiamine (B-1) injection 100 mg (not administered)  HYDROmorphone (DILAUDID) injection 0.5 mg (not administered)  pantoprazole (PROTONIX) injection 40 mg (not administered)  pantoprazole (PROTONIX) 80 mg in sodium chloride 0.9 % 250 mL (0.32 mg/mL) infusion (not administered)  metoCLOPramide (REGLAN) injection 10 mg (10 mg Intravenous Given 09/04/16 2335)  sodium chloride 0.9 % bolus 1,000 mL (1,000 mLs Intravenous New Bag/Given 09/04/16 2337)  LORazepam (ATIVAN) injection 2 mg (2 mg Intravenous Given 09/04/16 2335)     Initial Impression / Assessment and Plan / ED Course  I have reviewed the triage vital signs and the nursing notes.  Pertinent labs & imaging results that were available during my care of the patient were reviewed by me and considered in my medical decision making (see chart for details).  Clinical Course as of Sep 06 155  Thu  Sep 05, 2016  0055 Chem 8 , has not crossed over into the EMR. The patient has an anion gap of 26. Hemoglobin 12.9. Creatinine today is 1.6. Glucose 161, BUN 19. Bicarbonate is 10 currently. I ordered a lactic acid to add onto her workup given the high anion gap metabolic acidosis.  [AH]  0128 I performed a rectal temperature on the patient and she had a temperature of 98.9 rectally, however, the patient feels very warm to the touch, and I'm concerned that this isn't an accurate reading. Given her oral temperature was also 99.1. I have asked her nurse to recheck an oral temperature at about 30 minutes.   [AH]  0131 The patient currently fits sepsis criteria. Therefore, I have ordered a code sepsis and started broad-spectrum antibiotics. Patient is  alert. He received her fluid resuscitation based on 30 mL per kilo..  [AH]  0132 AST: (!) 314 [AH]  0132 Patient noted to have transaminitis and elevated bilirubin ALT: (!) 98 [AH]  0132 Total Bilirubin: (!) 2.1 [AH]  0133 Creatinine: (!) 1.89 [AH]  0133 EGFR (Non-African Amer.): (!) 33 [AH]  0133 History of acute kidney injury. Her GFR is also low at this point. EGFR (African American): (!) 39 [AH]  0145 Bicard is remarkably low CO2: (!) 7 [AH]  [id number] Patient is currently menstruating  Hemoglobin: (!) 10.9 [AH]    Clinical Course User Index [AH] Margarita Mail, PA-C    Patient's here, likely in alcohol withdrawal. She has definite hematemesis. Patient has a history of previous scope that showed a Mallory-Weiss tear. 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   Patient is significantly ill with high anion gap metabolic acidosis, acute renal failure, lactic acidosis, and high suspicion for acute alcohol withdrawal. Patient will be admitted to the stepdown unit. She is received fluid resuscitation with weight-based dosing, IV Vanco and Zosyn with code sepsis called secondary to meeting criteria. Thus the case with Dr. Tamala Julian who will admit the patient to the stepdown unit. Final Clinical Impressions(s) / ED Diagnoses   Final diagnoses:  None    New Prescriptions New Prescriptions   No medications on file     Margarita Mail, PA-C 09/05/16 [place], MD 09/05/16 [phone]

## 2016-09-04 NOTE — ED Triage Notes (Signed)
Pt presents to the ed with complaints of abdominal cramping, vomiting coffee grounds and some diarrhea that started on Sunday that has gotten progressively worse.  She had a previous episode in January, she notices this happens around here period, she received 4 mg of zofran iv with ems, she is breathing fast and is diaphoretic

## 2016-09-04 NOTE — ED Provider Notes (Signed)
ED ECG REPORT [id number]   Date: 09/04/2016  Rate: 154  Rhythm: sinus tachycardia  QRS Axis: right  Intervals: QT prolonged  ST/T Wave abnormalities: nonspecific ST changes  Conduction Disutrbances:none  I have personally reviewed the EKG tracing and agree with the computerized printout as noted.   Patient seen/examined in the Emergency Department in conjunction with Midlevel Provider Harris Patient reports nausea/vomiting and hematemesis Exam : awake/alert, actively vomiting, she is tachycardic.  abd soft with diffuse tenderness Plan: plan to rehydrate, treat nausea/pain and will likely require admission     Zadie Rhine, MD 09/04/16 2334

## 2016-09-05 ENCOUNTER — Inpatient Hospital Stay (HOSPITAL_COMMUNITY): Payer: Self-pay

## 2016-09-05 DIAGNOSIS — F10239 Alcohol dependence with withdrawal, unspecified: Secondary | ICD-10-CM

## 2016-09-05 DIAGNOSIS — R112 Nausea with vomiting, unspecified: Secondary | ICD-10-CM

## 2016-09-05 DIAGNOSIS — E872 Acidosis: Secondary | ICD-10-CM

## 2016-09-05 DIAGNOSIS — D72829 Elevated white blood cell count, unspecified: Secondary | ICD-10-CM

## 2016-09-05 DIAGNOSIS — N179 Acute kidney failure, unspecified: Secondary | ICD-10-CM | POA: Diagnosis present

## 2016-09-05 DIAGNOSIS — R6510 Systemic inflammatory response syndrome (SIRS) of non-infectious origin without acute organ dysfunction: Secondary | ICD-10-CM

## 2016-09-05 DIAGNOSIS — R740 Nonspecific elevation of levels of transaminase and lactic acid dehydrogenase [LDH]: Secondary | ICD-10-CM

## 2016-09-05 DIAGNOSIS — K922 Gastrointestinal hemorrhage, unspecified: Secondary | ICD-10-CM | POA: Insufficient documentation

## 2016-09-05 DIAGNOSIS — F1010 Alcohol abuse, uncomplicated: Secondary | ICD-10-CM

## 2016-09-05 DIAGNOSIS — K852 Alcohol induced acute pancreatitis without necrosis or infection: Secondary | ICD-10-CM

## 2016-09-05 DIAGNOSIS — E875 Hyperkalemia: Secondary | ICD-10-CM | POA: Diagnosis present

## 2016-09-05 DIAGNOSIS — K920 Hematemesis: Secondary | ICD-10-CM | POA: Diagnosis present

## 2016-09-05 LAB — BASIC METABOLIC PANEL
ANION GAP: 19 — AB (ref 5–15)
BUN: 17 mg/dL (ref 6–20)
CALCIUM: 7.3 mg/dL — AB (ref 8.9–10.3)
CO2: 11 mmol/L — ABNORMAL LOW (ref 22–32)
Chloride: 111 mmol/L (ref 101–111)
Creatinine, Ser: 1.57 mg/dL — ABNORMAL HIGH (ref 0.44–1.00)
GFR calc Af Amer: 48 mL/min — ABNORMAL LOW (ref >60)
GFR, EST NON AFRICAN AMERICAN: 42 mL/min — AB (ref >60)
GLUCOSE: 183 mg/dL — AB (ref 65–99)
Potassium: 4.7 mmol/L (ref 3.5–5.1)
Sodium: 141 mmol/L (ref 135–145)

## 2016-09-05 LAB — COMPREHENSIVE METABOLIC PANEL
ALBUMIN: 3.5 g/dL (ref 3.5–5.0)
ALK PHOS: 61 U/L (ref 38–126)
ALT: 98 U/L — ABNORMAL HIGH (ref 14–54)
ALT: 69 U/L — AB (ref 14–54)
ANION GAP: 32 — AB (ref 5–15)
AST: 314 U/L — ABNORMAL HIGH (ref 15–41)
AST: 211 U/L — AB (ref 15–41)
Albumin: 4.5 g/dL (ref 3.5–5.0)
Alkaline Phosphatase: 42 U/L (ref 38–126)
Anion gap: 13 (ref 5–15)
BILIRUBIN TOTAL: 2.1 mg/dL — AB (ref 0.3–1.2)
BUN: 16 mg/dL (ref 6–20)
BUN: 18 mg/dL (ref 6–20)
CALCIUM: 8.3 mg/dL — AB (ref 8.9–10.3)
CHLORIDE: 114 mmol/L — AB (ref 101–111)
CO2: 7 mmol/L — ABNORMAL LOW (ref 22–32)
CO2: 14 mmol/L — AB (ref 22–32)
CREATININE: 1.32 mg/dL — AB (ref 0.44–1.00)
Calcium: 7.6 mg/dL — ABNORMAL LOW (ref 8.9–10.3)
Chloride: 103 mmol/L (ref 101–111)
Creatinine, Ser: 1.89 mg/dL — ABNORMAL HIGH (ref 0.44–1.00)
GFR calc Af Amer: 39 mL/min — ABNORMAL LOW (ref >60)
GFR calc Af Amer: 60 mL/min — ABNORMAL LOW (ref >60)
GFR, EST NON AFRICAN AMERICAN: 33 mL/min — AB (ref >60)
GFR, EST NON AFRICAN AMERICAN: 52 mL/min — AB (ref >60)
GLUCOSE: 161 mg/dL — AB (ref 65–99)
Glucose, Bld: 125 mg/dL — ABNORMAL HIGH (ref 65–99)
POTASSIUM: 4.1 mmol/L (ref 3.5–5.1)
POTASSIUM: 5.2 mmol/L — AB (ref 3.5–5.1)
SODIUM: 141 mmol/L (ref 135–145)
Sodium: 142 mmol/L (ref 135–145)
Total Bilirubin: 1.8 mg/dL — ABNORMAL HIGH (ref 0.3–1.2)
Total Protein: 8.1 g/dL (ref 6.5–8.1)
Total Protein: 6.2 g/dL — ABNORMAL LOW (ref 6.5–8.1)

## 2016-09-05 LAB — CBC
HEMATOCRIT: 30.1 % — AB (ref 36.0–46.0)
HEMOGLOBIN: 9.2 g/dL — AB (ref 12.0–15.0)
MCH: 28.8 pg (ref 26.0–34.0)
MCHC: 30.6 g/dL (ref 30.0–36.0)
MCV: 94.1 fL (ref 78.0–100.0)
Platelets: 275 10*3/uL (ref 150–400)
RBC: 3.2 MIL/uL — AB (ref 3.87–5.11)
RDW: 15.5 % (ref 11.5–15.5)
WBC: 12.7 10*3/uL — ABNORMAL HIGH (ref 4.0–10.5)

## 2016-09-05 LAB — ACETAMINOPHEN LEVEL: Acetaminophen (Tylenol), Serum: <10 ug/mL — ABNORMAL LOW (ref 10–30)

## 2016-09-05 LAB — CBC WITH DIFFERENTIAL/PLATELET
BASOS PCT: 0 %
Basophils Absolute: 0 10*3/uL (ref 0.0–0.1)
EOS PCT: 0 %
Eosinophils Absolute: 0 10*3/uL (ref 0.0–0.7)
HCT: 36.2 % (ref 36.0–46.0)
Hemoglobin: 10.9 g/dL — ABNORMAL LOW (ref 12.0–15.0)
LYMPHS PCT: 3 %
Lymphs Abs: 0.5 10*3/uL — ABNORMAL LOW (ref 0.7–4.0)
MCH: 28.5 pg (ref 26.0–34.0)
MCHC: 30.1 g/dL (ref 30.0–36.0)
MCV: 94.8 fL (ref 78.0–100.0)
MONO ABS: 0.6 10*3/uL (ref 0.1–1.0)
Monocytes Relative: 4 %
NEUTROS ABS: 14.9 10*3/uL — AB (ref 1.7–7.7)
Neutrophils Relative %: 93 %
PLATELETS: 348 10*3/uL (ref 150–400)
RBC: 3.82 MIL/uL — AB (ref 3.87–5.11)
RDW: 15.2 % (ref 11.5–15.5)
WBC: 16 10*3/uL — AB (ref 4.0–10.5)

## 2016-09-05 LAB — LIPASE, BLOOD: Lipase: 686 U/L — ABNORMAL HIGH (ref 11–51)

## 2016-09-05 LAB — HEMOGLOBIN AND HEMATOCRIT, BLOOD
HEMATOCRIT: 29.3 % — AB (ref 36.0–46.0)
HEMATOCRIT: 29.5 % — AB (ref 36.0–46.0)
HEMATOCRIT: 29.4 % — AB (ref 36.0–46.0)
HEMOGLOBIN: 8.9 g/dL — AB (ref 12.0–15.0)
HEMOGLOBIN: 8.8 g/dL — AB (ref 12.0–15.0)
HEMOGLOBIN: 9 g/dL — AB (ref 12.0–15.0)

## 2016-09-05 LAB — I-STAT CHEM 8, ED
BUN: 19 mg/dL (ref 6–20)
CALCIUM ION: 0.95 mmol/L — AB (ref 1.15–1.40)
CHLORIDE: 110 mmol/L (ref 101–111)
CREATININE: 1.6 mg/dL — AB (ref 0.44–1.00)
GLUCOSE: 161 mg/dL — AB (ref 65–99)
HCT: 38 % (ref 36.0–46.0)
Hemoglobin: 12.9 g/dL (ref 12.0–15.0)
Potassium: 5.1 mmol/L (ref 3.5–5.1)
Sodium: 140 mmol/L (ref 135–145)
TCO2: 10 mmol/L (ref 0–100)

## 2016-09-05 LAB — SALICYLATE LEVEL: Salicylate Lvl: <7 mg/dL (ref 2.8–30.0)

## 2016-09-05 LAB — TYPE AND SCREEN
ABO/RH(D): A POS
ANTIBODY SCREEN: NEGATIVE

## 2016-09-05 LAB — I-STAT BETA HCG BLOOD, ED (MC, WL, AP ONLY): I-stat hCG, quantitative: 6 m[IU]/mL — ABNORMAL HIGH (ref <5)

## 2016-09-05 LAB — MRSA PCR SCREENING: MRSA by PCR: NEGATIVE

## 2016-09-05 LAB — AMMONIA: Ammonia: 32 umol/L (ref 9–35)

## 2016-09-05 LAB — I-STAT CG4 LACTIC ACID, ED: Lactic Acid, Venous: 3.71 mmol/L (ref 0.5–1.9)

## 2016-09-05 LAB — PROTIME-INR
INR: 1.18
PROTHROMBIN TIME: 15.1 s (ref 11.4–15.2)

## 2016-09-05 LAB — LACTIC ACID, PLASMA
Lactic Acid, Venous: 1.4 mmol/L (ref 0.5–1.9)
Lactic Acid, Venous: 1.6 mmol/L (ref 0.5–1.9)

## 2016-09-05 LAB — APTT: APTT: 28 s (ref 24–36)

## 2016-09-05 LAB — ABO/RH: ABO/RH(D): A POS

## 2016-09-05 LAB — OCCULT BLOOD GASTRIC / DUODENUM (SPECIMEN CUP): Occult Blood, Gastric: NEGATIVE

## 2016-09-05 MED ORDER — SODIUM CHLORIDE 0.9 % IV SOLN
INTRAVENOUS | Status: DC
  Administered 2016-09-05 – 2016-09-07 (×7): via INTRAVENOUS

## 2016-09-05 MED ORDER — PROMETHAZINE HCL 25 MG/ML IJ SOLN
12.5000 mg | Freq: Once | INTRAMUSCULAR | Status: AC
  Administered 2016-09-05: 12.5 mg via INTRAVENOUS
  Filled 2016-09-05: qty 1

## 2016-09-05 MED ORDER — PIPERACILLIN-TAZOBACTAM 3.375 G IVPB 30 MIN
3.3750 g | Freq: Once | INTRAVENOUS | Status: AC
  Administered 2016-09-05: 3.375 g via INTRAVENOUS
  Filled 2016-09-05: qty 50

## 2016-09-05 MED ORDER — VANCOMYCIN HCL IN DEXTROSE 1-5 GM/200ML-% IV SOLN
1000.0000 mg | Freq: Once | INTRAVENOUS | Status: AC
  Administered 2016-09-05: 1000 mg via INTRAVENOUS
  Filled 2016-09-05: qty 200

## 2016-09-05 MED ORDER — HYDROMORPHONE HCL 1 MG/ML IJ SOLN
0.5000 mg | INTRAMUSCULAR | Status: DC | PRN
  Administered 2016-09-05 – 2016-09-06 (×5): 0.5 mg via INTRAVENOUS
  Filled 2016-09-05 (×5): qty 1

## 2016-09-05 MED ORDER — ONDANSETRON HCL 4 MG/2ML IJ SOLN
4.0000 mg | Freq: Three times a day (TID) | INTRAMUSCULAR | Status: DC | PRN
  Administered 2016-09-05 – 2016-09-06 (×4): 4 mg via INTRAVENOUS
  Filled 2016-09-05 (×4): qty 2

## 2016-09-05 MED ORDER — PIPERACILLIN-TAZOBACTAM 3.375 G IVPB
3.3750 g | Freq: Three times a day (TID) | INTRAVENOUS | Status: DC
  Administered 2016-09-05 – 2016-09-06 (×4): 3.375 g via INTRAVENOUS
  Filled 2016-09-05 (×6): qty 50

## 2016-09-05 MED ORDER — ORAL CARE MOUTH RINSE
15.0000 mL | Freq: Two times a day (BID) | OROMUCOSAL | Status: DC
  Administered 2016-09-05 – 2016-09-06 (×4): 15 mL via OROMUCOSAL

## 2016-09-05 MED ORDER — VANCOMYCIN HCL 500 MG IV SOLR
500.0000 mg | Freq: Two times a day (BID) | INTRAVENOUS | Status: DC
  Administered 2016-09-05 – 2016-09-06 (×2): 500 mg via INTRAVENOUS
  Filled 2016-09-05 (×2): qty 500

## 2016-09-05 MED ORDER — METOCLOPRAMIDE HCL 5 MG/ML IJ SOLN
5.0000 mg | Freq: Two times a day (BID) | INTRAMUSCULAR | Status: AC | PRN
  Administered 2016-09-05 – 2016-09-06 (×2): 5 mg via INTRAVENOUS
  Filled 2016-09-05 (×2): qty 2

## 2016-09-05 MED ORDER — STERILE WATER FOR INJECTION IJ SOLN
INTRAMUSCULAR | Status: AC
  Administered 2016-09-05: 01:00:00
  Filled 2016-09-05: qty 10

## 2016-09-05 MED ORDER — SODIUM CHLORIDE 0.9 % IV SOLN
Freq: Once | INTRAVENOUS | Status: AC
  Administered 2016-09-05: 04:00:00 via INTRAVENOUS

## 2016-09-05 NOTE — ED Notes (Signed)
istat lactic acid 3.71 shown to provider.

## 2016-09-05 NOTE — H&P (Signed)
History and Physical    Susan [name] JQZ:[id number] DOB: 07/12/1980 DOA: 09/04/2016  Referring MD/NP/PA: Margarita Mail, PA-C PCP: No PCP Per Patient  Patient coming from: Home via EMS  Chief Complaint: Nausea and vomiting  HPI: Susan [name] is a 36 y.o. female with medical history significant of suspected alcohol abuse and GI bleed secondary to gastritis in 05/2016; who presents with complaints of intractable nausea and vomiting. Patient mother is present at bedside and helps provide history as patient recently received Ativan and is currently altered.  The patient has recently been under a lot of stress with marital problems. Symptoms of nausea and vomiting initially started 4 days ago. Since that time symptoms have progressively worsened to the point where patient is unable to keep any food or liquids down. Emesis had turned dark brown- black in appearance over the last day. Patient notes drinking more alcohol than usual reporting drinking 3 glasses of liquor daily recently. She left her husband and has been staying with her mother the last few days. Associated symptoms include subjective fever and generalized weakness. The patient was noted to have collapsed today and family was unable to get her back up and therefore called EMS. Patient's mother denies any recent alcohol use last day or so to her knowledge and denies any suicidal ideations or risks of ingestion. The patient was also currently noted to be on her period as well and her mother notes that her periods are usually heavy, and the patient usually has associated nausea and vomiting symptoms with them. She was admitted into the hospital for similar symptoms in 05/2016 for which she was scoped by Dr. Perrin Maltese.  ED Course: Upon admission into the emergency department patient was seen to be afebrile, pulse 123-157, respirations elevated greater than 20, and all other vitals maintained. Lab work revealed WBC 16, hemoglobin  10.9, sodium 142, potassium 5.2, chloride 103, CO2 7, BUN 18, creatinine 1.89, glucose 161, lactic acid 3.71, anion gap 32, AST 314, ALT 98, total bilirubin 2.1. Chest x-ray showed no acute abnormalities and urinalysis had yet to be collected. Sepsis will call was initiated, blood cultures were obtained, patient was given 2 L of fluid, and started on broad-spectrum antibiotics of vancomycin and Zosyn. CWIA protocol was also initiated.   Review of Systems: As per HPI otherwise 10 point review of systems negative.   Past Medical History:  Diagnosis Date  . Gastritis     Past Surgical History:  Procedure Laterality Date  . ESOPHAGOGASTRODUODENOSCOPY (EGD) WITH PROPOFOL N/A 06/05/2016   Procedure: ESOPHAGOGASTRODUODENOSCOPY (EGD) WITH PROPOFOL;  Surgeon: Otis Brace, MD;  Location: [facility];  Service: Gastroenterology;  Laterality: N/A;     reports that she has quit smoking. She has never used smokeless tobacco. She reports that she drinks alcohol. She reports that she does not use drugs.  No Known Allergies  No family history on file.  Prior to Admission medications   Medication Sig Start Date End Date Taking? Authorizing Provider  acetaminophen (TYLENOL) 325 MG tablet Take 650 mg by mouth every 6 (six) hours as needed.    Historical Provider, MD  Cyanocobalamin (B-12 PO) Take 1 tablet by mouth daily.    Historical Provider, MD  esomeprazole (NEXIUM) 20 MG capsule Take 20 mg by mouth daily at 12 noon.    Historical Provider, MD  ferrous sulfate 325 (65 FE) MG tablet Take 325 mg by mouth daily with breakfast.    Historical Provider, MD  folic acid (FOLVITE) 1  MG tablet Take 1 tablet (1 mg total) by mouth daily. 06/06/16   Reyne Dumas, MD  Multiple Vitamin (MULTIVITAMIN WITH MINERALS) TABS tablet Take 2 tablets by mouth daily.    Historical Provider, MD  sucralfate (CARAFATE) 1 GM/10ML suspension Take 10 mLs (1 g total) by mouth 4 (four) times daily -  with meals and at bedtime.  06/05/16   Reyne Dumas, MD  thiamine 100 MG tablet Take 1 tablet (100 mg total) by mouth daily. 06/06/16   Reyne Dumas, MD    Physical Exam:   Constitutional: Lethargic and sick appearing, but arousable with repeated verbal stimuli.  Vitals:   09/05/16 0000 09/05/16 0115 09/05/16 0120 09/05/16 0129  BP: (!) 131/93 (!) 142/89    Pulse: (!) 128 (!) 123    Resp: (!) 46 20    Temp:   99.1 F (37.3 C) 98.9 F (37.2 C)  TempSrc:   Oral Rectal  SpO2:  100%     Eyes: PERRL, lids and conjunctivae normal ENMT: Mucous membranes are dry. Posterior pharynx clear of any exudate or lesions. Normal dentition.  Neck: normal, supple, no masses, no thyromegaly Respiratory: clear to auscultation bilaterally, no wheezing, no crackles. Normal respiratory effort. No accessory muscle use.  Cardiovascular:Tachycardic, no murmurs / rubs / gallops. No extremity edema. 2+ pedal pulses. No carotid bruits.  Abdomen: no tenderness, no masses palpated. No hepatosplenomegaly. Bowel sounds positive.  Musculoskeletal: no clubbing / cyanosis. No joint deformity upper and lower extremities. Good ROM, no contractures. Normal muscle tone.  Skin: no rashes, lesions, ulcers. No induration. Diaphoretic  Neurologic: CN 2-12 grossly intact. Sensation intact, DTR normal. Strength 5/5 in all 4.  Psychiatric:Lethargic and confused.   Labs on Admission: I have personally reviewed following labs and imaging studies  CBC:  Recent Labs Lab 09/05/16 0032  WBC 16.0*  NEUTROABS 14.9*  HGB 10.9*  HCT 36.2  MCV 94.8  PLT 025   Basic Metabolic Panel:  Recent Labs Lab 09/05/16 0032  NA 142  K 5.2*  CL 103  CO2 7*  GLUCOSE 161*  BUN 18  CREATININE 1.89*  CALCIUM 8.3*   GFR: CrCl cannot be calculated (Unknown ideal weight.). Liver Function Tests:  Recent Labs Lab 09/05/16 0032  AST 314*  ALT 98*  ALKPHOS 61  BILITOT 2.1*  PROT 8.1  ALBUMIN 4.5   No results for input(s): LIPASE, AMYLASE in the last 168  hours.  Recent Labs Lab 09/05/16 0032  AMMONIA 32   Coagulation Profile:  Recent Labs Lab 09/05/16 0032  INR 1.18   Cardiac Enzymes: No results for input(s): CKTOTAL, CKMB, CKMBINDEX, TROPONINI in the last 168 hours. BNP (last 3 results) No results for input(s): PROBNP in the last 8760 hours. HbA1C: No results for input(s): HGBA1C in the last 72 hours. CBG: No results for input(s): GLUCAP in the last 168 hours. Lipid Profile: No results for input(s): CHOL, HDL, LDLCALC, TRIG, CHOLHDL, LDLDIRECT in the last 72 hours. Thyroid Function Tests: No results for input(s): TSH, T4TOTAL, FREET4, T3FREE, THYROIDAB in the last 72 hours. Anemia Panel: No results for input(s): VITAMINB12, FOLATE, FERRITIN, TIBC, IRON, RETICCTPCT in the last 72 hours. Urine analysis:    Component Value Date/Time   COLORURINE YELLOW 06/04/2016 1238   APPEARANCEUR HAZY (A) 06/04/2016 1238   LABSPEC >1.046 (H) 06/04/2016 1238   PHURINE 7.0 06/04/2016 1238   GLUCOSEU NEGATIVE 06/04/2016 1238   HGBUR NEGATIVE 06/04/2016 1238   BILIRUBINUR NEGATIVE 06/04/2016 1238   KETONESUR 80 (A) 06/04/2016  [place] (A) 06/04/2016 1238   NITRITE NEGATIVE 06/04/2016 1238   LEUKOCYTESUR NEGATIVE 06/04/2016 1238   Sepsis Labs: No results found for this or any previous visit (from the past 240 hour(s)).   Radiological Exams on Admission: Dg Chest Port 1 View  Result Date: 09/04/2016 CLINICAL DATA:  Acute onset of generalized abdominal cramping and coffee-ground emesis. Diarrhea. Initial encounter. EXAM: PORTABLE CHEST 1 VIEW COMPARISON:  None. FINDINGS: The lungs are well-aerated and clear. There is no evidence of focal opacification, pleural effusion or pneumothorax. The cardiomediastinal silhouette is within normal limits. No acute osseous abnormalities are seen. IMPRESSION: No acute cardiopulmonary process seen. Electronically Signed   By: Garald Balding M.D.   On: 09/04/2016 23:55    EKG: Independently  reviewed. Sinus tachycardia 154 bpm with prolonged QTC 585  Assessment/Plan Upper GI bleed with hematemesis: Acute. Patient reports nausea and vomiting of dark coffee ground emesis. Patient with previous admission in 05/2016, with similar symptoms for which patient underwent EGD by Dr. Perrin Maltese. EGD revealed gastritis as the cause of symptoms. Question possibility of Mallory-Weiss tear versus gastritis similar to previous admission from alcohol abuse. - Admit to stepdown - NPO - Continue Protonix gtt - IVF NS at 158m/hr - Will need to Consult gastroenterology in a.m.  Intractable nausea and vomiting  - Avoid antiemetics due to prolonged QTC  Suspect acute blood loss anemia  - T&S for possible need of blood products  - Serial monitoring of H&H   SIRS with lactic acidosis: Patient presents with tachycardia, tachypnea, and elevated WBC meeting SIRS criteria. Chest x-ray clear. Patient was empirically started on vancomycin and Zosyn - Will follow-up urinalysis and blood cultures - Trend lactic acid level - Determine if continued antibiotics are warrented  Acute kidney injury: Baseline creatinine had previously been 0.78 a few months ago. Patient presents with elevated creatinine of 1.89 and a BUN of 18.  - Follow-up repeat Cr - I want further investigation if not improving with IV fluids   Metabolic acidosis with elevated anion gap. Question of secondary to alcohol abuse versus other - Follow-up ethanol, acetaminophen, and salicylate levels - Follow-up repeat BMP   Prolonged QTC - Correct electrolyte abnormalities - Recheck EKG in a.m.  Hyperkalemia: Initial potassium noted to be 5.2 on admission. - IVF - Follow-up repeat potassium level  Alcohol abuse with possible alcohol withdrawal : Acute. - CWIA protocol   Elevated transaminases: AST: ALT ratio suggestive of alcohol abuse. - Continue to monitor  DVT prophylaxis: SCD   Code Status: Full Family Communication: Discussed  plan of care with the patient and her mother present at bedside Disposition Plan: Likely discharge home once medically stable  Consults called: none  Admission status: Inpatient  RNorval MortonMD [facility] Pager [phone] If 7PM-7AM, please contact night-coverage [email or web address] Password [email or web address]  09/05/2016, 1:42 AM

## 2016-09-05 NOTE — Progress Notes (Signed)
[facility] PROGRESS NOTE  Susan [name] NFA:[id number] DOB: May 01, 1981 DOA: 09/04/2016 PCP: No PCP Per Patient  Brief summary  36 y.o. female with medical history significant of alcohol abuse and GI bleed secondary to gastritis in 05/2016 who presents with complaints of intractable nausea and vomiting and episode of hematemesis. The patient has recently been under a lot of stress with marital problems. Symptoms of nausea and vomiting initially started 4 days ago. Since that time symptoms have progressively worsened to the point where patient is unable to keep any food or liquids down. Emesis had turned dark brown- black in appearance over the last day -on admission: Lab work revealed WBC 16, hemoglobin 10.9, sodium 142, potassium 5.2, chloride 103, CO2 7, BUN 18, creatinine 1.89, glucose 161, lactic acid 3.71, anion gap 32, AST 314, ALT 98, total bilirubin 2.1. Chest x-ray showed no acute abnormalities and urinalysis had yet to be collected. Sepsis will call was initiated, blood cultures were obtained, patient was given 2 L of fluid, and started on broad-spectrum antibiotics of vancomycin and Zosyn. CWIA protocol was also initiated   Assessment/Plan:  Acute pancreatitis. Suspected alcoholic pancreatitis. Will obtain abdominal ultrasound, may need ct if renal function continues to improve. KUB: unremarkable    -NPO, iv fluids, pain control, antiemetics, f/u abd ultrasound. Counseled to stop using etoh   Hematemesis. Probable MWT. Patient with previous admission in 05/2016, with similar symptoms for which patient underwent EGD by Dr. Perrin Maltese. EGD revealed gastritis as the cause of symptoms.  -cont iv PPI. No new episode of bleeding since AM. Cont monitor, repeat serial h/h. Will consult GI if bleeding   SIRS with lactic acidosis: Patient presents with tachycardia, tachypnea, and elevated WBC meeting SIRS criteria. Chest x-ray clear. Patient was empirically started on vancomycin and  Zosyn. Will d/c atx if culture negative AM  Acute kidney injury, hyperkalemia.  Baseline creatinine had previously been 0.78 a few months ago. Patient presents with elevated creatinine of 1.89 and a BUN of 18.  -cont iv fluids, monitor I/o, urine output. f/u renal US  Metabolic acidosis ,lactic acidosis with elevated anion gap. Question of secondary to alcohol abuse versus other. improving with iv fluids   Alcohol abuse with possible alcohol withdrawal : Acute. - CWIA protocol     Code Status: full Family Communication: d/w patient, her mother  (indicate person spoken with, relationship, and if by phone, the number) Disposition Plan: home when ready    Consultants:  none  Procedures:  Pend abd [place]  Antibiotics:  vanc/zosyn 3/29>> (indicate start date, and stop date if known)  HPI/Subjective: Alert, feels much better. Vomited once. But no bleeding. No acute chest pains. Has epigastric abd pains   Objective: Vitals:   09/05/16 0411 09/05/16 0723  BP: (!) 149/102 138/89  Pulse: (!) 116 (!) 116  Resp: 18 18  Temp: 99.6 F (37.6 C) 99.3 F (37.4 C)    Intake/Output Summary (Last 24 hours) at 09/05/16 1122 Last data filed at 09/05/16 0430  Gross per 24 hour  Intake             2000 ml  Output              500 ml  Net             1500 ml   Filed Weights   09/05/16 0300 09/05/16 0411  Weight: 57.6 kg (126 lb 15.8 oz) 57 kg (125 lb 10.6 oz)    Exam:   General:  No distress   Cardiovascular: s1,s2 rrr  Respiratory: CTA BL  Abdomen: soft, mild epigastric tender  Musculoskeletal: no leg edema    Data Reviewed: Basic Metabolic Panel:  Recent Labs Lab 09/05/16 0032 09/05/16 0043 09/05/16 0544  NA 142 140 141  K 5.2* 5.1 4.7  CL 103 110 111  CO2 7*  --  11*  GLUCOSE 161* 161* 183*  BUN 18 19 17   CREATININE 1.89* 1.60* 1.57*  CALCIUM 8.3*  --  7.3*   Liver Function Tests:  Recent Labs Lab 09/05/16 0032  AST 314*  ALT 98*  ALKPHOS 61   BILITOT 2.1*  PROT 8.1  ALBUMIN 4.5    Recent Labs Lab 09/05/16 0032  LIPASE 686*    Recent Labs Lab 09/05/16 0032  AMMONIA 32   CBC:  Recent Labs Lab 09/05/16 0032 09/05/16 0043 09/05/16 0544 09/05/16 0815  WBC 16.0*  --  12.7*  --   NEUTROABS 14.9*  --   --   --   HGB 10.9* 12.9 9.2* 8.9*  HCT 36.2 38.0 30.1* 29.3*  MCV 94.8  --  94.1  --   PLT 348  --  275  --    Cardiac Enzymes: No results for input(s): CKTOTAL, CKMB, CKMBINDEX, TROPONINI in the last 168 hours. BNP (last 3 results) No results for input(s): BNP in the last 8760 hours.  ProBNP (last 3 results) No results for input(s): PROBNP in the last 8760 hours.  CBG: No results for input(s): GLUCAP in the last 168 hours.  Recent Results (from the past 240 hour(s))  MRSA PCR Screening     Status: None   Collection Time: 09/05/16  4:39 AM  Result Value Ref Range Status   MRSA by PCR NEGATIVE NEGATIVE Final    Comment:        The GeneXpert MRSA Assay (FDA approved for NASAL specimens only), is one component of a comprehensive MRSA colonization surveillance program. It is not intended to diagnose MRSA infection nor to guide or monitor treatment for MRSA infections.      Studies: Dg Abd 1 View  Result Date: 09/05/2016 CLINICAL DATA:  Gastrointestinal bleeding EXAM: ABDOMEN - 1 VIEW COMPARISON:  June 04, 2016 FINDINGS: There is no bowel dilatation or air-fluid level suggesting bowel obstruction. No free air. No abnormal calcifications. IMPRESSION: No demonstrable bowel obstruction or free air. Electronically Signed   By: Lowella Grip III M.D.   On: 09/05/2016 10:34   Dg Chest Port 1 View  Result Date: 09/04/2016 CLINICAL DATA:  Acute onset of generalized abdominal cramping and coffee-ground emesis. Diarrhea. Initial encounter. EXAM: PORTABLE CHEST 1 VIEW COMPARISON:  None. FINDINGS: The lungs are well-aerated and clear. There is no evidence of focal opacification, pleural effusion or  pneumothorax. The cardiomediastinal silhouette is within normal limits. No acute osseous abnormalities are seen. IMPRESSION: No acute cardiopulmonary process seen. Electronically Signed   By: Garald Balding M.D.   On: 09/04/2016 23:55    Scheduled Meds: . LORazepam  0-4 mg Intravenous Q6H   Followed by  . [START ON 09/07/2016] LORazepam  0-4 mg Intravenous Q12H  . mouth rinse  15 mL Mouth Rinse BID  . piperacillin-tazobactam (ZOSYN)  IV  3.375 g Intravenous Q8H  . thiamine  100 mg Intravenous Daily  . vancomycin  500 mg Intravenous Q12H   Continuous Infusions: . sodium chloride 150 mL/hr at 09/05/16 0908  . pantoprozole (PROTONIX) infusion 8 mg/hr (09/05/16 0502)    Principal Problem:   Acute  upper GI bleed Active Problems:   Intractable vomiting with nausea   SIRS (systemic inflammatory response syndrome) (HCC)   Lactic acidosis   Alcohol abuse   Alcohol withdrawal (HCC)   Hematemesis   AKI (acute kidney injury) ([place])   Hyperkalemia    Time spent: >35 minutes     Kinnie Feil  [facility] Pager [phone]. If 7PM-7AM, please contact night-coverage at [email or web address], password [facility] 09/05/2016, 11:22 AM  LOS: 0 days

## 2016-09-05 NOTE — Plan of Care (Signed)
Problem: Education: Goal: Knowledge of [facility] Education information/materials will improve Outcome: Progressing Patient and patient's mother aware of plan of care.  RN instructed patient to call and wait for staff assistance prior to getting out of bed for safety reasons.  Patient stated understanding.

## 2016-09-05 NOTE — Progress Notes (Signed)
Pharmacy Antibiotic Note  Susan [name] is a 36 y.o. female admitted on 09/04/2016 with sepsis and GIB.  Pharmacy has been consulted for Vancocin and Zosyn dosing.  SCr up 1.89 (0.78 11mo ago).  Plan: Vancomycin 1000mg  x1 IV and recheck SCr prior to further dosing.  Goal trough 15-20 mcg/mL. Zosyn 3.375g IV q8h (4 hour infusion).  Height: 5\' 3"  (160 cm) Weight: 126 lb 15.8 oz (57.6 kg) IBW/kg (Calculated) : 52.4  Temp (24hrs), Avg:99.1 F (37.3 C), Min:98.9 F (37.2 C), Max:99.3 F (37.4 C)   Recent Labs Lab 09/05/16 0032 09/05/16 0043 09/05/16 0113  WBC 16.0*  --   --   CREATININE 1.89* 1.60*  --   LATICACIDVEN  --   --  3.71*    Estimated Creatinine Clearance: 40.6 mL/min (A) (by C-G formula based on SCr of 1.6 mg/dL (H)).    No Known Allergies   Thank you for allowing pharmacy to be a part of this patient's care.  Vernard Gambles, PharmD, BCPS  09/05/2016 3:13 AM

## 2016-09-05 NOTE — Progress Notes (Signed)
Initial Nutrition Assessment  DOCUMENTATION CODES:   Not applicable  INTERVENTION:  Once no longer NPO, recommend Ensure Enlive BID. Each supplement provides 350 kcal and 20 grams of protein.   NUTRITION DIAGNOSIS:   Inadequate oral intake related to nausea, vomiting, social / environmental circumstances, poor appetite as evidenced by per patient/family report, energy intake < or equal to 50% for > or equal to 5 days, estimated needs.   GOAL:   Patient will meet greater than or equal to 90% of their needs  MONITOR:   Supplement acceptance, PO intake  REASON FOR ASSESSMENT:   Malnutrition Screening Tool    ASSESSMENT:   36 y.o. Female with medical hx significant of suspected alcohol abuse and GI bleed secondary to gastritis in 05/2016, presenting with intractable nausea and vomiting. Patient has been under a lot of recent stress with marital problems, symptoms of nausea and vomiting began 4 days PTA.  Patient reported nausea and vomiting over the past 2 days. Patient reported she has been basically eating nothing over the past week. Patient reports weight loss but she is unsure of the amount at this time. Patient's mother came into the room during the time of the exam. She also reported patient has lost a significant amount of weight but was unsure of the amount. She stated patient has been a vegetarian since the age of 79.  Patient agreed to nutrition supplement once she is no longer NPO.   Nutrition focused physical exam performed. No muscle or fat depletion present.  Meds Reviewed: Thiamine  Labs Reviewed: Glucose 183 (H), Calcium 7.3 (L), Creatinine 1.57 (H)  Diet Order:  Diet NPO time specified  Skin:  Reviewed, no issues  Last BM:  3/26  Height:   Ht Readings from Last 1 Encounters:  09/05/16 5\' 3"  (1.6 m)    Weight:   Wt Readings from Last 1 Encounters:  09/05/16 125 lb 10.6 oz (57 kg)    Ideal Body Weight:  52 kg  BMI:  Body mass index is 22.26  kg/m.  Estimated Nutritional Needs:   Kcal:  1700-1900  Protein:  75-85 grams  Fluid:   >/=1.5 L  EDUCATION NEEDS:   No education needs identified at this time  Rex Kras M.S. Nutrition Dietetic Intern

## 2016-09-06 LAB — URINALYSIS, ROUTINE W REFLEX MICROSCOPIC
BILIRUBIN URINE: NEGATIVE
Glucose, UA: NEGATIVE mg/dL
Ketones, ur: 20 mg/dL — AB
Leukocytes, UA: NEGATIVE
NITRITE: NEGATIVE
PH: 5 (ref 5.0–8.0)
Protein, ur: 100 mg/dL — AB
SPECIFIC GRAVITY, URINE: 1.023 (ref 1.005–1.030)

## 2016-09-06 LAB — BASIC METABOLIC PANEL
ANION GAP: 11 (ref 5–15)
BUN: 14 mg/dL (ref 6–20)
CHLORIDE: 114 mmol/L — AB (ref 101–111)
CO2: 16 mmol/L — AB (ref 22–32)
Calcium: 7.8 mg/dL — ABNORMAL LOW (ref 8.9–10.3)
Creatinine, Ser: 0.94 mg/dL (ref 0.44–1.00)
GFR calc Af Amer: >60 mL/min (ref >60)
GFR calc non Af Amer: >60 mL/min (ref >60)
GLUCOSE: 86 mg/dL (ref 65–99)
POTASSIUM: 3 mmol/L — AB (ref 3.5–5.1)
Sodium: 141 mmol/L (ref 135–145)

## 2016-09-06 LAB — HEMOGLOBIN AND HEMATOCRIT, BLOOD
HCT: 29 % — ABNORMAL LOW (ref 36.0–46.0)
HEMOGLOBIN: 8.7 g/dL — AB (ref 12.0–15.0)

## 2016-09-06 LAB — CBC
HEMATOCRIT: 26.9 % — AB (ref 36.0–46.0)
Hemoglobin: 8.1 g/dL — ABNORMAL LOW (ref 12.0–15.0)
MCH: 28.4 pg (ref 26.0–34.0)
MCHC: 30.1 g/dL (ref 30.0–36.0)
MCV: 94.4 fL (ref 78.0–100.0)
Platelets: 228 10*3/uL (ref 150–400)
RBC: 2.85 MIL/uL — ABNORMAL LOW (ref 3.87–5.11)
RDW: 15.4 % (ref 11.5–15.5)
WBC: 9.9 10*3/uL (ref 4.0–10.5)

## 2016-09-06 LAB — LIPASE, BLOOD: Lipase: 811 U/L — ABNORMAL HIGH (ref 11–51)

## 2016-09-06 MED ORDER — VITAMIN B-1 100 MG PO TABS
100.0000 mg | ORAL_TABLET | Freq: Every day | ORAL | Status: DC
  Administered 2016-09-06 – 2016-09-09 (×4): 100 mg via ORAL
  Filled 2016-09-06 (×4): qty 1

## 2016-09-06 MED ORDER — PANTOPRAZOLE SODIUM 40 MG IV SOLR
40.0000 mg | Freq: Two times a day (BID) | INTRAVENOUS | Status: DC
  Administered 2016-09-06 – 2016-09-08 (×4): 40 mg via INTRAVENOUS
  Filled 2016-09-06 (×4): qty 40

## 2016-09-06 MED ORDER — VANCOMYCIN HCL IN DEXTROSE 750-5 MG/150ML-% IV SOLN
750.0000 mg | Freq: Two times a day (BID) | INTRAVENOUS | Status: DC
  Filled 2016-09-06: qty 150

## 2016-09-06 MED ORDER — POTASSIUM CHLORIDE CRYS ER 20 MEQ PO TBCR
20.0000 meq | EXTENDED_RELEASE_TABLET | Freq: Three times a day (TID) | ORAL | Status: AC
  Administered 2016-09-06 – 2016-09-07 (×3): 20 meq via ORAL
  Filled 2016-09-06 (×3): qty 1

## 2016-09-06 MED ORDER — FOLIC ACID 1 MG PO TABS
1.0000 mg | ORAL_TABLET | Freq: Every day | ORAL | Status: DC
  Administered 2016-09-06 – 2016-09-09 (×4): 1 mg via ORAL
  Filled 2016-09-06 (×4): qty 1

## 2016-09-06 MED ORDER — TRAMADOL HCL 50 MG PO TABS
50.0000 mg | ORAL_TABLET | Freq: Four times a day (QID) | ORAL | Status: DC | PRN
  Administered 2016-09-06 – 2016-09-08 (×3): 50 mg via ORAL
  Filled 2016-09-06 (×3): qty 1

## 2016-09-06 MED ORDER — ONDANSETRON HCL 4 MG/2ML IJ SOLN
4.0000 mg | Freq: Four times a day (QID) | INTRAMUSCULAR | Status: DC | PRN
  Administered 2016-09-08: 4 mg via INTRAVENOUS
  Filled 2016-09-06 (×2): qty 2

## 2016-09-06 MED ORDER — OXYCODONE HCL 5 MG/5ML PO SOLN
5.0000 mg | Freq: Four times a day (QID) | ORAL | Status: DC | PRN
  Administered 2016-09-06 – 2016-09-08 (×4): 5 mg via ORAL
  Filled 2016-09-06 (×4): qty 5

## 2016-09-06 MED ORDER — ACETAMINOPHEN 325 MG PO TABS
325.0000 mg | ORAL_TABLET | Freq: Four times a day (QID) | ORAL | Status: DC | PRN
  Administered 2016-09-07 – 2016-09-08 (×2): 325 mg via ORAL
  Filled 2016-09-06 (×2): qty 1

## 2016-09-06 NOTE — Progress Notes (Signed)
MD mcclung notified of pt's potassium with morning labs being 3.0. Will await new orders & continue to monitor the pt.Sanda Linger, RN

## 2016-09-06 NOTE — Progress Notes (Signed)
[facility] TEAM 1 - Stepdown/ICU TEAM  Susan [name]  OXB:[id number] DOB: 1980-10-19 DOA: 09/04/2016 PCP: No PCP Per Patient    Brief Narrative:  36 y.o.femalewith history ofalcohol abuse andGI bleed secondary to gastritis in 05/2016 who presented with complaints of intractable nausea and vomiting and an episode of hematemesis. In the ED the pt was found to be suffering w/ acute pancreatitis.    Subjective: The patient reports ongoing epigastric pain.  She denies current vomiting but does state she is nauseated.  She denies fevers chills or chest pain.  There has been no shortness of breath.  Assessment & Plan:  Acute pancreatitis alcoholic pancreatitis - abdominal ultrasound without evidence of complication - small volume clear liquids only - pain control - nausea control   Hematemesis Suspected MWT v/s EtOH related gastritis - cont PPI - appears to have stopped   SIRSwith lactic acidosis Due to acute pancreatitis - no clinical evidence of sepsis - stop abx   Acute kidney injury Baseline creatinine 0.78 - creatinine 1.89 at admission - resolved   Recent Labs Lab 09/05/16 0032 09/05/16 0043 09/05/16 0544 09/05/16 1151 09/06/16 0457  CREATININE 1.89* 1.60* 1.57* 1.32* 0.94    Transaminitis Due to above - improving  Metabolic acidosis Corrected w/ volume expansion   Alcohol abuse CIWA protocol in place - stable at this time    DVT prophylaxis: lovenox  Code Status: FULL CODE Family Communication: Spoke with mother and sister at bedside at length Disposition Plan: SDU  Consultants:  none  Procedures: none  Antimicrobials:  None presently   Objective: Blood pressure (!) 144/105, pulse (!) 102, temperature 98.2 F (36.8 C), temperature source Oral, resp. rate 10, height 5' 3"  (1.6 m), weight 58.8 kg (129 lb 9.6 oz), last menstrual period 09/04/2016, SpO2 100 %.  Intake/Output Summary (Last 24 hours) at 09/06/16 1533 Last data filed at  09/06/16 1507  Gross per 24 hour  Intake             2910 ml  Output             1470 ml  Net             1440 ml   Filed Weights   09/05/16 0411 09/06/16 0318 09/06/16 0655  Weight: 57 kg (125 lb 10.6 oz) 59 kg (130 lb 1.1 oz) 58.8 kg (129 lb 9.6 oz)    Examination: General: No acute respiratory distress Lungs: Clear to auscultation bilaterally without wheezes or crackles Cardiovascular: Regular rate and rhythm without murmur gallop or rub normal S1 and S2 Abdomen: Tender to palpation in epigastrium, no masses, no rebound, bowel sounds hypoactive but present Extremities: No significant cyanosis, clubbing, or edema bilateral lower extremities  CBC:  Recent Labs Lab 09/05/16 0032  09/05/16 0544 09/05/16 0815 09/05/16 1151 09/05/16 1802 09/05/16 2250 09/06/16 0457  WBC 16.0*  --  12.7*  --   --   --   --  9.9  NEUTROABS 14.9*  --   --   --   --   --   --   --   HGB 10.9*  < > 9.2* 8.9* 8.8* 9.0* 8.7* 8.1*  HCT 36.2  < > 30.1* 29.3* 29.5* 29.4* 29.0* 26.9*  MCV 94.8  --  94.1  --   --   --   --  94.4  PLT 348  --  275  --   --   --   --  228  < > =  values in this interval not displayed. Basic Metabolic Panel:  Recent Labs Lab 09/05/16 0032 09/05/16 0043 09/05/16 0544 09/05/16 1151 09/06/16 0457  NA 142 140 141 141 141  K 5.2* 5.1 4.7 4.1 3.0*  CL 103 110 111 114* 114*  CO2 7*  --  11* 14* 16*  GLUCOSE 161* 161* 183* 125* 86  BUN 18 19 17 16 14   CREATININE 1.89* 1.60* 1.57* 1.32* 0.94  CALCIUM 8.3*  --  7.3* 7.6* 7.8*   GFR: Estimated Creatinine Clearance: 69.1 mL/min (by C-G formula based on SCr of 0.94 mg/dL).  Liver Function Tests:  Recent Labs Lab 09/05/16 0032 09/05/16 1151  AST 314* 211*  ALT 98* 69*  ALKPHOS 61 42  BILITOT 2.1* 1.8*  PROT 8.1 6.2*  ALBUMIN 4.5 3.5    Recent Labs Lab 09/05/16 0032 09/06/16 0457  LIPASE 686* 811*    Recent Labs Lab 09/05/16 0032  AMMONIA 32    Coagulation Profile:  Recent Labs Lab 09/05/16 0032    INR 1.18    HbA1C: Hgb A1c MFr Bld  Date/Time Value Ref Range Status  06/04/2016 06:38 PM 5.1 4.8 - 5.6 % Final    Comment:    (NOTE)         Pre-diabetes: 5.7 - 6.4         Diabetes: >6.4         Glycemic control for adults with diabetes: <7.0      Recent Results (from the past 240 hour(s))  Blood Culture (routine x 2)     Status: None (Preliminary result)   Collection Time: 09/05/16  2:00 AM  Result Value Ref Range Status   Specimen Description BLOOD LEFT ARM  Final   Special Requests BOTTLES DRAWN AEROBIC AND ANAEROBIC 5ML  Final   Culture NO GROWTH < 12 HOURS  Final   Report Status PENDING  Incomplete  Blood Culture (routine x 2)     Status: None (Preliminary result)   Collection Time: 09/05/16  2:04 AM  Result Value Ref Range Status   Specimen Description BLOOD LEFT HAND  Final   Special Requests BOTTLES DRAWN AEROBIC AND ANAEROBIC 5ML  Final   Culture NO GROWTH < 12 HOURS  Final   Report Status PENDING  Incomplete  MRSA PCR Screening     Status: None   Collection Time: 09/05/16  4:39 AM  Result Value Ref Range Status   MRSA by PCR NEGATIVE NEGATIVE Final    Comment:        The GeneXpert MRSA Assay (FDA approved for NASAL specimens only), is one component of a comprehensive MRSA colonization surveillance program. It is not intended to diagnose MRSA infection nor to guide or monitor treatment for MRSA infections.      Scheduled Meds: . LORazepam  0-4 mg Intravenous Q6H   Followed by  . [START ON 09/07/2016] LORazepam  0-4 mg Intravenous Q12H  . mouth rinse  15 mL Mouth Rinse BID  . potassium chloride  20 mEq Oral TID  . thiamine  100 mg Intravenous Daily   Continuous Infusions: . sodium chloride 150 mL/hr at 09/06/16 1348  . pantoprozole (PROTONIX) infusion 8 mg/hr (09/06/16 0410)     LOS: 1 day   Cherene Altes, MD [facility] Office  [phone] Pager - Text Page per Amion as per below:  On-Call/Text Page:      [name]       password [email or web address]  If 7PM-7AM, please contact night-coverage [email or web address] Password [email or web address]  09/06/2016, 3:33 PM

## 2016-09-06 NOTE — Plan of Care (Signed)
Problem: Fluid Volume: Goal: Ability to maintain a balanced intake and output will improve Outcome: Progressing Pt's diet was advanced to clear liquids. Pt tolerating diet okay thus far.  Problem: Nutrition: Goal: Adequate nutrition will be maintained Outcome: Progressing Pt's diet was advanced to clear liquids. Pt tolerating diet okay thus far.

## 2016-09-06 NOTE — Progress Notes (Addendum)
Patient ambulated to bathroom with staff assistance x1 and front wheel walker.  While patient in the bathroom heart rate increased to 157-158, patient reported feeling dizzy.  Patient escorted back to bed and heart rate in the 110's, dizziness resolved.  RN explained to patient since her heart rate elevated and she was symptomatic next time patient needed to use the bathroom the bedside commode would be used.  Patient stated understanding.   Previously this shift patient ambulated to and from the bathroom and heart rate into the 130's.  Patient asymptomatic.  Once patient back in bed heart rate 90-100's.

## 2016-09-06 NOTE — Progress Notes (Signed)
Patient heart rate into the 140's with bedside commode use.  Once back in bed heart rate 90-100's.
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
## 2016-09-07 DIAGNOSIS — R1013 Epigastric pain: Secondary | ICD-10-CM

## 2016-09-07 LAB — COMPREHENSIVE METABOLIC PANEL
ALT: 42 U/L (ref 14–54)
ANION GAP: 8 (ref 5–15)
AST: 86 U/L — ABNORMAL HIGH (ref 15–41)
Albumin: 3 g/dL — ABNORMAL LOW (ref 3.5–5.0)
Alkaline Phosphatase: 40 U/L (ref 38–126)
BUN: 5 mg/dL — ABNORMAL LOW (ref 6–20)
CHLORIDE: 110 mmol/L (ref 101–111)
CO2: 21 mmol/L — AB (ref 22–32)
Calcium: 8.1 mg/dL — ABNORMAL LOW (ref 8.9–10.3)
Creatinine, Ser: 0.75 mg/dL (ref 0.44–1.00)
GFR calc non Af Amer: >60 mL/min (ref >60)
Glucose, Bld: 86 mg/dL (ref 65–99)
POTASSIUM: 3 mmol/L — AB (ref 3.5–5.1)
SODIUM: 139 mmol/L (ref 135–145)
Total Bilirubin: 1 mg/dL (ref 0.3–1.2)
Total Protein: 5.6 g/dL — ABNORMAL LOW (ref 6.5–8.1)

## 2016-09-07 LAB — CBC
HEMATOCRIT: 25 % — AB (ref 36.0–46.0)
HEMOGLOBIN: 7.7 g/dL — AB (ref 12.0–15.0)
MCH: 28.1 pg (ref 26.0–34.0)
MCHC: 30.8 g/dL (ref 30.0–36.0)
MCV: 91.2 fL (ref 78.0–100.0)
Platelets: 230 10*3/uL (ref 150–400)
RBC: 2.74 MIL/uL — AB (ref 3.87–5.11)
RDW: 15.4 % (ref 11.5–15.5)
WBC: 7 10*3/uL (ref 4.0–10.5)

## 2016-09-07 LAB — MAGNESIUM: Magnesium: 1.5 mg/dL — ABNORMAL LOW (ref 1.7–2.4)

## 2016-09-07 LAB — PHOSPHORUS: Phosphorus: 1.9 mg/dL — ABNORMAL LOW (ref 2.5–4.6)

## 2016-09-07 MED ORDER — MAGNESIUM SULFATE 2 GM/50ML IV SOLN
2.0000 g | Freq: Once | INTRAVENOUS | Status: AC
  Administered 2016-09-07: 2 g via INTRAVENOUS
  Filled 2016-09-07: qty 50

## 2016-09-07 MED ORDER — K PHOS MONO-SOD PHOS DI & MONO 155-852-130 MG PO TABS
500.0000 mg | ORAL_TABLET | Freq: Three times a day (TID) | ORAL | Status: DC
  Administered 2016-09-07 – 2016-09-08 (×4): 500 mg via ORAL
  Filled 2016-09-07 (×5): qty 2

## 2016-09-07 MED ORDER — ENSURE ENLIVE PO LIQD: 237.0000 mL | Freq: Three times a day (TID) | ORAL | Status: DC | PRN

## 2016-09-07 NOTE — Progress Notes (Signed)
[facility] TEAM 1 - Stepdown/ICU TEAM  Susan [name]  VFI:[id number] DOB: [birth year] DOA: 09/04/2016 PCP: No PCP Per Patient    Brief Narrative:  36 y.o.femalewith history ofalcohol abuse andGI bleed secondary to gastritis in 05/2016 who presented with complaints of intractable nausea and vomiting and an episode of hematemesis. In the ED the pt was found to be suffering w/ acute pancreatitis.    Subjective: Feels much better this morning.  Is tolerating clear liquids without difficulty.  Denies nausea or vomiting.  States epigastric pain persists but is significantly less severe and much more tolerable.  Denies chest pain shortness of breath or diarrhea.  Assessment & Plan:  Acute pancreatitis alcoholic pancreatitis - abdominal ultrasound without evidence of complication  - pain control - nausea control - begin advancing diet given rapid improvement in symptoms  Hematemesis Suspected MWT v/s EtOH related gastritis - cont PPI - appears to have stopped   Normocytic anemia  Felt to be a combination of chronic anemia related to alcohol abuse, probable menstrual loss, and acute blood loss related to hematemesis - hemoglobin stable   Recent Labs Lab 09/05/16 1151 09/05/16 1802 09/05/16 2250 09/06/16 0457 09/07/16 0347  HGB 8.8* 9.0* 8.7* 8.1* 7.7*    SIRSwith lactic acidosis Due to acute pancreatitis - no clinical evidence of sepsis - resolving w/ simple volume expansion off abx   Acute kidney injury - resolved  Baseline creatinine 0.78 - creatinine 1.89 at admission - resolved   Recent Labs Lab 09/05/16 0043 09/05/16 0544 09/05/16 1151 09/06/16 0457 09/07/16 0347  CREATININE 1.60* 1.57* 1.32* 0.94 0.75    Transaminitis Due to above - improving - cont to follow   Hypophosphatemia Due to above - replace and follow   Hypomagnesemia  Due to above - replace and follow   Hypokalemia  Due to above - replace and follow   Metabolic acidosis Corrected w/  volume expansion   Alcohol abuse CIWA protocol in place - stable at this time w/o evidence of withdrawal    DVT prophylaxis: lovenox  Code Status: FULL CODE Family Communication: Spoke with mother at bedside Disposition Plan: transfer to tele bed   Consultants:  none  Procedures: none  Antimicrobials:  None presently   Objective: Blood pressure (!) 138/103, pulse 93, temperature 98.3 F (36.8 C), temperature source Oral, resp. rate 20, height 5' 3"  (1.6 m), weight 58.8 kg (129 lb 9.6 oz), last menstrual period 09/04/2016, SpO2 100 %.  Intake/Output Summary (Last 24 hours) at 09/07/16 1000 Last data filed at 09/07/16 0900  Gross per 24 hour  Intake          4169.58 ml  Output             1320 ml  Net          2849.58 ml   Filed Weights   09/05/16 0411 09/06/16 0318 09/06/16 0655  Weight: 57 kg (125 lb 10.6 oz) 59 kg (130 lb 1.1 oz) 58.8 kg (129 lb 9.6 oz)    Examination: General: No acute respiratory distress Lungs: Clear to auscultation bilaterally - no wheezing  Cardiovascular: Regular rate and rhythm without murmur  Abdomen: Tender to palpation in epigastrium but less so, no masses, no rebound, bowel sounds hypoactive but present Extremities: No significant edema bilateral lower extremities  CBC:  Recent Labs Lab 09/05/16 0032  09/05/16 0544  09/05/16 1151 09/05/16 1802 09/05/16 2250 09/06/16 0457 09/07/16 0347  WBC 16.0*  --  12.7*  --   --   --   --  9.9 7.0  NEUTROABS 14.9*  --   --   --   --   --   --   --   --   HGB 10.9*  < > 9.2*  < > 8.8* 9.0* 8.7* 8.1* 7.7*  HCT 36.2  < > 30.1*  < > 29.5* 29.4* 29.0* 26.9* 25.0*  MCV 94.8  --  94.1  --   --   --   --  94.4 91.2  PLT 348  --  275  --   --   --   --  228 230  < > = values in this interval not displayed. Basic Metabolic Panel:  Recent Labs Lab 09/05/16 0032 09/05/16 0043 09/05/16 0544 09/05/16 1151 09/06/16 0457 09/07/16 0347  NA 142 140 141 141 141 139  K 5.2* 5.1 4.7 4.1 3.0* 3.0*    CL 103 110 111 114* 114* 110  CO2 7*  --  11* 14* 16* 21*  GLUCOSE 161* 161* 183* 125* 86 86  BUN 18 19 17 16 14  5*  CREATININE 1.89* 1.60* 1.57* 1.32* 0.94 0.75  CALCIUM 8.3*  --  7.3* 7.6* 7.8* 8.1*  MG  --   --   --   --   --  1.5*  PHOS  --   --   --   --   --  1.9*   GFR: Estimated Creatinine Clearance: 81.2 mL/min (by C-G formula based on SCr of 0.75 mg/dL).  Liver Function Tests:  Recent Labs Lab 09/05/16 0032 09/05/16 1151 09/07/16 0347  AST 314* 211* 86*  ALT 98* 69* 42  ALKPHOS 61 42 40  BILITOT 2.1* 1.8* 1.0  PROT 8.1 6.2* 5.6*  ALBUMIN 4.5 3.5 3.0*    Recent Labs Lab 09/05/16 0032 09/06/16 0457  LIPASE 686* 811*    Recent Labs Lab 09/05/16 0032  AMMONIA 32    Coagulation Profile:  Recent Labs Lab 09/05/16 0032  INR 1.18    HbA1C: Hgb A1c MFr Bld  Date/Time Value Ref Range Status  06/04/2016 06:38 PM 5.1 4.8 - 5.6 % Final    Comment:    (NOTE)         Pre-diabetes: 5.7 - 6.4         Diabetes: >6.4         Glycemic control for adults with diabetes: <7.0      Recent Results (from the past 240 hour(s))  Blood Culture (routine x 2)     Status: None (Preliminary result)   Collection Time: 09/05/16  2:00 AM  Result Value Ref Range Status   Specimen Description BLOOD LEFT ARM  Final   Special Requests BOTTLES DRAWN AEROBIC AND ANAEROBIC 5ML  Final   Culture NO GROWTH 1 DAY  Final   Report Status PENDING  Incomplete  Blood Culture (routine x 2)     Status: None (Preliminary result)   Collection Time: 09/05/16  2:04 AM  Result Value Ref Range Status   Specimen Description BLOOD LEFT HAND  Final   Special Requests BOTTLES DRAWN AEROBIC AND ANAEROBIC 5ML  Final   Culture NO GROWTH 1 DAY  Final   Report Status PENDING  Incomplete  MRSA PCR Screening     Status: None   Collection Time: 09/05/16  4:39 AM  Result Value Ref Range Status   MRSA by PCR NEGATIVE NEGATIVE Final    Comment:        The GeneXpert MRSA Assay (FDA approved for  NASAL specimens  only), is one component of a comprehensive MRSA colonization surveillance program. It is not intended to diagnose MRSA infection nor to guide or monitor treatment for MRSA infections.      Scheduled Meds: . folic acid  1 mg Oral Daily  . LORazepam  0-4 mg Intravenous Q12H  . mouth rinse  15 mL Mouth Rinse BID  . pantoprazole (PROTONIX) IV  40 mg Intravenous Q12H  . thiamine  100 mg Oral Daily   Continuous Infusions: . sodium chloride 75 mL/hr at 09/06/16 1617     LOS: 2 days   Cherene Altes, MD [facility] Office  [phone] Pager - Text Page per Shea Evans as per below:  On-Call/Text Page:      [name]      password [email or web address]  If 7PM-7AM, please contact night-coverage [email or web address] Password [email or web address] 09/07/2016, 10:00 AM

## 2016-09-08 LAB — BASIC METABOLIC PANEL
Anion gap: 6 (ref 5–15)
BUN: <5 mg/dL — ABNORMAL LOW (ref 6–20)
CHLORIDE: 106 mmol/L (ref 101–111)
CO2: 27 mmol/L (ref 22–32)
CREATININE: 0.73 mg/dL (ref 0.44–1.00)
Calcium: 8.2 mg/dL — ABNORMAL LOW (ref 8.9–10.3)
GFR calc non Af Amer: >60 mL/min (ref >60)
Glucose, Bld: 118 mg/dL — ABNORMAL HIGH (ref 65–99)
POTASSIUM: 2.8 mmol/L — AB (ref 3.5–5.1)
SODIUM: 139 mmol/L (ref 135–145)

## 2016-09-08 LAB — COMPREHENSIVE METABOLIC PANEL
ALT: 35 U/L (ref 14–54)
ANION GAP: 9 (ref 5–15)
AST: 61 U/L — AB (ref 15–41)
Albumin: 2.9 g/dL — ABNORMAL LOW (ref 3.5–5.0)
Alkaline Phosphatase: 45 U/L (ref 38–126)
BUN: <5 mg/dL — ABNORMAL LOW (ref 6–20)
CHLORIDE: 106 mmol/L (ref 101–111)
CO2: 25 mmol/L (ref 22–32)
Calcium: 8 mg/dL — ABNORMAL LOW (ref 8.9–10.3)
Creatinine, Ser: 0.63 mg/dL (ref 0.44–1.00)
GFR calc non Af Amer: >60 mL/min (ref >60)
Glucose, Bld: 102 mg/dL — ABNORMAL HIGH (ref 65–99)
Potassium: 2.4 mmol/L — CL (ref 3.5–5.1)
Sodium: 140 mmol/L (ref 135–145)
Total Bilirubin: 0.7 mg/dL (ref 0.3–1.2)
Total Protein: 5.6 g/dL — ABNORMAL LOW (ref 6.5–8.1)

## 2016-09-08 LAB — CBC
HCT: 24.2 % — ABNORMAL LOW (ref 36.0–46.0)
HEMOGLOBIN: 7.7 g/dL — AB (ref 12.0–15.0)
MCH: 28.7 pg (ref 26.0–34.0)
MCHC: 31.8 g/dL (ref 30.0–36.0)
MCV: 90.3 fL (ref 78.0–100.0)
Platelets: 257 10*3/uL (ref 150–400)
RBC: 2.68 MIL/uL — ABNORMAL LOW (ref 3.87–5.11)
RDW: 15.9 % — ABNORMAL HIGH (ref 11.5–15.5)
WBC: 5.3 10*3/uL (ref 4.0–10.5)

## 2016-09-08 LAB — MAGNESIUM: Magnesium: 2.1 mg/dL (ref 1.7–2.4)

## 2016-09-08 LAB — LIPASE, BLOOD: Lipase: 551 U/L — ABNORMAL HIGH (ref 11–51)

## 2016-09-08 LAB — PHOSPHORUS: PHOSPHORUS: 4.1 mg/dL (ref 2.5–4.6)

## 2016-09-08 MED ORDER — POTASSIUM CHLORIDE CRYS ER 20 MEQ PO TBCR
40.0000 meq | EXTENDED_RELEASE_TABLET | Freq: Once | ORAL | Status: AC
  Administered 2016-09-08: 40 meq via ORAL
  Filled 2016-09-08: qty 2

## 2016-09-08 MED ORDER — POTASSIUM CHLORIDE IN NACL 40-0.9 MEQ/L-% IV SOLN
INTRAVENOUS | Status: DC
  Administered 2016-09-08: 50 mL/h via INTRAVENOUS
  Filled 2016-09-08 (×2): qty 1000

## 2016-09-08 MED ORDER — ONDANSETRON HCL 4 MG PO TABS
4.0000 mg | ORAL_TABLET | Freq: Four times a day (QID) | ORAL | Status: DC | PRN
  Filled 2016-09-08: qty 1

## 2016-09-08 MED ORDER — PANTOPRAZOLE SODIUM 40 MG PO TBEC
40.0000 mg | DELAYED_RELEASE_TABLET | Freq: Every day | ORAL | Status: DC
  Administered 2016-09-09: 40 mg via ORAL
  Filled 2016-09-08: qty 1

## 2016-09-08 MED ORDER — POTASSIUM CHLORIDE CRYS ER 20 MEQ PO TBCR
40.0000 meq | EXTENDED_RELEASE_TABLET | Freq: Three times a day (TID) | ORAL | Status: AC
  Administered 2016-09-08 (×3): 40 meq via ORAL
  Filled 2016-09-08 (×3): qty 2

## 2016-09-08 NOTE — Progress Notes (Signed)
[facility] TEAM 1 - Stepdown/ICU TEAM  Susan [name]  FVC:[id number] DOB: [birth year] DOA: 09/04/2016 PCP: No PCP Per Patient    Brief Narrative:  36 y.o.femalewith history ofalcohol abuse andGI bleed secondary to gastritis in 05/2016 who presented with complaints of intractable nausea and vomiting and an episode of hematemesis. In the ED the pt was found to be suffering w/ acute pancreatitis.    Subjective: Tolerating her diet w/o signif nausea or vomiting or pain.  Reports near complete resolution of her epigastric pain.  Is having some trouble sleeping in the hospital.  No diarrhea.    Assessment & Plan:  Acute pancreatitis alcoholic pancreatitis - abdominal ultrasound without evidence of complication  - pain control - nausea control - tolerating diet well - counseled again on absolute need to avoid EtOH use of any quantity   Hematemesis Suspected MWT v/s EtOH related gastritis - cont PPI - clinically resolved   Normocytic anemia  Felt to be a combination of chronic anemia related to alcohol abuse, probable menstrual loss, and acute blood loss related to hematemesis - hemoglobin stable   Recent Labs Lab 09/05/16 1802 09/05/16 2250 09/06/16 0457 09/07/16 0347 09/08/16 0344  HGB 9.0* 8.7* 8.1* 7.7* 7.7*    SIRSwith lactic acidosis Due to acute pancreatitis - no clinical evidence of sepsis - resolved w/ simple volume expansion   Acute kidney injury - resolved  Baseline creatinine 0.78 - creatinine 1.89 at admission - resolved   Recent Labs Lab 09/05/16 0544 09/05/16 1151 09/06/16 0457 09/07/16 0347 09/08/16 0344  CREATININE 1.57* 1.32* 0.94 0.75 0.63    Transaminitis Due to above - essentially resolved   Hypophosphatemia Corrected w/ supplementation   Hypomagnesemia  Corrected w/ supplementation   Hypokalemia  Due to above - replace and follow - cont to watch on tele as K still severely low reflecting signif total body deficit   Metabolic  acidosis Corrected w/ volume expansion   Alcohol abuse CIWA protocol in place - stable at this time w/o evidence of withdrawal    DVT prophylaxis: lovenox  Code Status: FULL CODE Family Communication: Spoke with mother at bedside Disposition Plan: d/c home when electrolytes fully stable   Consultants:  none  Procedures: none  Antimicrobials:  None presently   Objective: Blood pressure (!) 142/98, pulse 91, temperature 98.9 F (37.2 C), temperature source Oral, resp. rate 20, height 5' 3"  (1.6 m), weight 58.8 kg (129 lb 9.6 oz), last menstrual period 09/04/2016, SpO2 100 %.  Intake/Output Summary (Last 24 hours) at 09/08/16 1116 Last data filed at 09/08/16 0917  Gross per 24 hour  Intake          1731.24 ml  Output              400 ml  Net          1331.24 ml   Filed Weights   09/05/16 0411 09/06/16 0318 09/06/16 0655  Weight: 57 kg (125 lb 10.6 oz) 59 kg (130 lb 1.1 oz) 58.8 kg (129 lb 9.6 oz)    Examination: General: No acute respiratory distress - alert and oriented  Lungs: Clear to auscultation bilaterally Cardiovascular: Regular rate and rhythm  Abdomen: NT/ND, soft, bs+, no mass  Extremities: No significant edema B LE   CBC:  Recent Labs Lab 09/05/16 0032  09/05/16 0544  09/05/16 1802 09/05/16 2250 09/06/16 0457 09/07/16 0347 09/08/16 0344  WBC 16.0*  --  12.7*  --   --   --  9.9  7.0 5.3  NEUTROABS 14.9*  --   --   --   --   --   --   --   --   HGB 10.9*  < > 9.2*  < > 9.0* 8.7* 8.1* 7.7* 7.7*  HCT 36.2  < > 30.1*  < > 29.4* 29.0* 26.9* 25.0* 24.2*  MCV 94.8  --  94.1  --   --   --  94.4 91.2 90.3  PLT 348  --  275  --   --   --  228 230 257  < > = values in this interval not displayed. Basic Metabolic Panel:  Recent Labs Lab 09/05/16 0544 09/05/16 1151 09/06/16 0457 09/07/16 0347 09/08/16 0344  NA 141 141 141 139 140  K 4.7 4.1 3.0* 3.0* 2.4*  CL 111 114* 114* 110 106  CO2 11* 14* 16* 21* 25  GLUCOSE 183* 125* 86 86 102*  BUN 17 16 14   5* <5*  CREATININE 1.57* 1.32* 0.94 0.75 0.63  CALCIUM 7.3* 7.6* 7.8* 8.1* 8.0*  MG  --   --   --  1.5* 2.1  PHOS  --   --   --  1.9* 4.1   GFR: Estimated Creatinine Clearance: 81.2 mL/min (by C-G formula based on SCr of 0.63 mg/dL).  Liver Function Tests:  Recent Labs Lab 09/05/16 0032 09/05/16 1151 09/07/16 0347 09/08/16 0344  AST 314* 211* 86* 61*  ALT 98* 69* 42 35  ALKPHOS 61 42 40 45  BILITOT 2.1* 1.8* 1.0 0.7  PROT 8.1 6.2* 5.6* 5.6*  ALBUMIN 4.5 3.5 3.0* 2.9*    Recent Labs Lab 09/05/16 0032 09/06/16 0457 09/08/16 0344  LIPASE 686* 811* 551*    Recent Labs Lab 09/05/16 0032  AMMONIA 32    Coagulation Profile:  Recent Labs Lab 09/05/16 0032  INR 1.18    HbA1C: Hgb A1c MFr Bld  Date/Time Value Ref Range Status  06/04/2016 06:38 PM 5.1 4.8 - 5.6 % Final    Comment:    (NOTE)         Pre-diabetes: 5.7 - 6.4         Diabetes: >6.4         Glycemic control for adults with diabetes: <7.0      Recent Results (from the past 240 hour(s))  Blood Culture (routine x 2)     Status: None (Preliminary result)   Collection Time: 09/05/16  2:00 AM  Result Value Ref Range Status   Specimen Description BLOOD LEFT ARM  Final   Special Requests BOTTLES DRAWN AEROBIC AND ANAEROBIC 5ML  Final   Culture NO GROWTH 2 DAYS  Final   Report Status PENDING  Incomplete  Blood Culture (routine x 2)     Status: None (Preliminary result)   Collection Time: 09/05/16  2:04 AM  Result Value Ref Range Status   Specimen Description BLOOD LEFT HAND  Final   Special Requests BOTTLES DRAWN AEROBIC AND ANAEROBIC 5ML  Final   Culture NO GROWTH 2 DAYS  Final   Report Status PENDING  Incomplete  MRSA PCR Screening     Status: None   Collection Time: 09/05/16  4:39 AM  Result Value Ref Range Status   MRSA by PCR NEGATIVE NEGATIVE Final    Comment:        The GeneXpert MRSA Assay (FDA approved for NASAL specimens only), is one component of a comprehensive MRSA  colonization surveillance program. It is not intended to diagnose MRSA  infection nor to guide or monitor treatment for MRSA infections.      Scheduled Meds: . folic acid  1 mg Oral Daily  . mouth rinse  15 mL Mouth Rinse BID  . pantoprazole (PROTONIX) IV  40 mg Intravenous Q12H  . phosphorus  500 mg Oral TID  . thiamine  100 mg Oral Daily   Continuous Infusions: . 0.9 % NaCl with KCl 40 mEq / L 50 mL/hr (09/08/16 0550)     LOS: 3 days   Cherene Altes, MD [facility] Office  [phone] Pager - Text Page per Amion as per below:  On-Call/Text Page:      [name]      password [email or web address]  If 7PM-7AM, please contact night-coverage [email or web address] Password [email or web address] 09/08/2016, 11:16 AM

## 2016-09-08 NOTE — Progress Notes (Signed)
Critical potassium value text paged to on-call [facility].

## 2016-09-09 LAB — COMPREHENSIVE METABOLIC PANEL
ALT: 33 U/L (ref 14–54)
AST: 43 U/L — AB (ref 15–41)
Albumin: 3.1 g/dL — ABNORMAL LOW (ref 3.5–5.0)
Alkaline Phosphatase: 46 U/L (ref 38–126)
Anion gap: 8 (ref 5–15)
CHLORIDE: 106 mmol/L (ref 101–111)
CO2: 25 mmol/L (ref 22–32)
CREATININE: 0.69 mg/dL (ref 0.44–1.00)
Calcium: 8.5 mg/dL — ABNORMAL LOW (ref 8.9–10.3)
GFR calc non Af Amer: >60 mL/min (ref >60)
Glucose, Bld: 112 mg/dL — ABNORMAL HIGH (ref 65–99)
POTASSIUM: 3 mmol/L — AB (ref 3.5–5.1)
SODIUM: 139 mmol/L (ref 135–145)
Total Bilirubin: 0.4 mg/dL (ref 0.3–1.2)
Total Protein: 6.1 g/dL — ABNORMAL LOW (ref 6.5–8.1)

## 2016-09-09 LAB — MAGNESIUM: Magnesium: 1.8 mg/dL (ref 1.7–2.4)

## 2016-09-09 LAB — CBC
HEMATOCRIT: 24.3 % — AB (ref 36.0–46.0)
Hemoglobin: 7.7 g/dL — ABNORMAL LOW (ref 12.0–15.0)
MCH: 28.4 pg (ref 26.0–34.0)
MCHC: 31.7 g/dL (ref 30.0–36.0)
MCV: 89.7 fL (ref 78.0–100.0)
PLATELETS: 289 10*3/uL (ref 150–400)
RBC: 2.71 MIL/uL — AB (ref 3.87–5.11)
RDW: 16.4 % — ABNORMAL HIGH (ref 11.5–15.5)
WBC: 5.1 10*3/uL (ref 4.0–10.5)

## 2016-09-09 LAB — PHOSPHORUS: Phosphorus: 3.2 mg/dL (ref 2.5–4.6)

## 2016-09-09 MED ORDER — ONDANSETRON 4 MG PO TBDP: 4.0000 mg | ORAL_TABLET | Freq: Three times a day (TID) | ORAL | 0 refills | Status: DC | PRN

## 2016-09-09 MED ORDER — POTASSIUM CHLORIDE CRYS ER 20 MEQ PO TBCR
40.0000 meq | EXTENDED_RELEASE_TABLET | Freq: Once | ORAL | Status: AC
  Administered 2016-09-09: 40 meq via ORAL
  Filled 2016-09-09: qty 2

## 2016-09-09 MED ORDER — POTASSIUM CHLORIDE CRYS ER 20 MEQ PO TBCR: 40.0000 meq | EXTENDED_RELEASE_TABLET | Freq: Every day | ORAL | 0 refills | Status: DC

## 2016-09-09 MED ORDER — PANTOPRAZOLE SODIUM 40 MG PO TBEC: 40.0000 mg | DELAYED_RELEASE_TABLET | Freq: Every day | ORAL | 0 refills | Status: DC

## 2016-09-09 MED ORDER — ACETAMINOPHEN 325 MG PO TABS: 325.0000 mg | ORAL_TABLET | Freq: Four times a day (QID) | ORAL | Status: DC | PRN

## 2016-09-09 NOTE — Discharge Instructions (Signed)
Acute Pancreatitis  Acute pancreatitis is a condition in which the pancreas suddenly becomes irritated and swollen (has inflammation). The pancreas is a gland that is located behind the stomach. It produces enzymes that help to digest food. The pancreas also releases the hormones glucagon and insulin, which help to regulate blood sugar. Damage to the pancreas occurs when the digestive enzymes from the pancreas are activated before they are released into the intestine. Most acute attacks last a couple of days and can cause serious problems. Some people become dehydrated and develop low blood pressure. In severe cases, bleeding into the pancreas can lead to shock and can be life-threatening. The lungs, heart, and kidneys may fail. What are the causes? The most common causes of this condition are:  Alcohol abuse.  Gallstones. Other causes include:  Certain medicines.  Exposure to certain chemicals.  Infection.  Damage caused by an accident (trauma).  Abdominal surgery. In some cases, the cause may not be known. What are the signs or symptoms? Symptoms of this condition include:  Pain in the upper abdomen that may radiate to the back.  Tenderness and swelling of the abdomen.  Nausea and vomiting. How is this diagnosed? This condition may be diagnosed based on:  A physical exam.  Blood tests.  Imaging tests, such as X-rays, CT scans, or an ultrasound of the abdomen. How is this treated? Treatment for this condition usually requires a stay in the hospital. Treatment may include:  Pain medicine.  Fluid replacement through an IV tube.  Placing a tube in the stomach to remove stomach contents and to control vomiting (NG tube, or nasogastric tube).  Not eating for 3-4 days. This gives the pancreas a rest, because enzymes are not being produced that can cause further damage.  Antibiotic medicines, if your condition is caused by an infection.  Surgery on the pancreas or  gallbladder. Follow these instructions at home: Eating and drinking   Follow instructions from your health care provider about diet. This may involve avoiding alcohol and decreasing the amount of fat in your diet.  Eat smaller, more frequent meals. This reduces the amount of digestive fluids that the pancreas produces.  Drink enough fluid to keep your urine clear or pale yellow.  Do not drink alcohol if it caused your condition. General instructions   Take over-the-counter and prescription medicines only as told by your health care provider.  Do not use any tobacco products, such as cigarettes, chewing tobacco, and e-cigarettes. If you need help quitting, ask your health care provider.  Get plenty of rest.  If directed, check your blood sugar at home as told by your health care provider.  Keep all follow-up visits as told by your health care provider. This is important. Contact a health care provider if:  You do not recover as quickly as expected.  You develop new or worsening symptoms.  You have persistent pain, weakness, or nausea.  You recover and then have another episode of pain.  You have a fever. Get help right away if:  You cannot eat or keep fluids down.  Your pain becomes severe.  Your skin or the white part of your eyes turns yellow (jaundice).  You vomit.  You feel dizzy or you faint.  Your blood sugar is high (over 300 mg/dL). This information is not intended to replace advice given to you by your health care provider. Make sure you discuss any questions you have with your health care provider. Document Released: 05/27/2005 Document  Revised: 10/04/2015 Document Reviewed: 02/28/2015 Elsevier Interactive Patient Education  2017 [employer].

## 2016-09-09 NOTE — Discharge Summary (Signed)
DISCHARGE SUMMARY  Susan [name]  MR#: [id number]  DOB:[birth year]  Date of Admission: 09/04/2016 Date of Discharge: 09/09/2016  Attending Physician:MCCLUNG,JEFFREY T  Patient's PCP:No PCP Per Patient  Consults:  none  Disposition: D/C home   Follow-up Appts: Follow-up Information    Primary Care Provider of your choice Follow up.   Why:  No specific follow-up appointment is required.  Should you develop recurrent abdom pain, contact the PCP of your choice for a visit.  If you develop severe sypmtoms return to the ED.            Discharge Diagnoses: Acute pancreatitis Hematemesis Normocytic anemia  SIRSwith lactic acidosis Acute kidney injury Transaminitis Hypophosphatemia Hypomagnesemia  Hypokalemia  Metabolic acidosis Alcohol abuse  Initial presentation: 36 y.o.femalewith history ofalcohol abuse andGI bleed secondary to gastritis in 05/2016 who presented with complaints of intractable nausea and vomiting and an episode of hematemesis. In the ED the pt was found to be suffering w/ acute pancreatitis.    Hospital Course:  Acute pancreatitis alcoholic pancreatitis - abdominal ultrasound without evidence of complication  - pain resolved prior to d/c - nausea controlled - tolerating diet well - counseled again on absolute need to avoid EtOH use of any quantity and she is fully committed to doing so  Hematemesis Suspected MWT v/s EtOH related gastritis - cont PPI (was on prior to admit) - clinically resolved   Normocytic anemia  Felt to be a combination of chronic anemia related to alcohol abuse, menstrual loss, and acute blood loss related to hematemesis - hemoglobin stable at time of d/c w/ no evidence of ongoing bleeding   SIRSwith lactic acidosis Due to acute pancreatitis - no clinical evidence of sepsis - resolved w/ simple volume expansion   Acute kidney injury - resolved  Baseline creatinine 0.78 - creatinine 1.89 at admission - resolved    Transaminitis Due to above - essentially resolved   Hypophosphatemia Corrected w/ supplementation   Hypomagnesemia  Corrected w/ supplementation   Hypokalemia  Due to above - K+ not yet at goal, but is now improving - cont to supplement for next 3 days, but w/ good oral intake should continue to improve as outpt   Metabolic acidosis Corrected w/ volume expansion   Alcohol abuse no evidence of withdrawal during hospital stay - numerous and length discussions held detailing the consequences of ongoing EtOH abuse - advised to avoid all EtOH - advised on risk of returning severe life threatening pancreatitis if drinking resumed, as well as long term risk of cirrhosis    Allergies as of 09/09/2016   No Known Allergies     Medication List    STOP taking these medications   folic acid 1 MG tablet Commonly known as:  FOLVITE   thiamine 100 MG tablet     TAKE these medications   acetaminophen 325 MG tablet Commonly known as:  TYLENOL Take 1 tablet (325 mg total) by mouth every 6 (six) hours as needed for mild pain, fever or headache. What changed:  how much to take  reasons to take this   B-12 PO Take 1 tablet by mouth daily.   ferrous sulfate 325 (65 FE) MG tablet Take 325 mg by mouth daily with breakfast.   multivitamin with minerals Tabs tablet Take 2 tablets by mouth daily.   ondansetron 4 MG disintegrating tablet Commonly known as:  ZOFRAN ODT Take 1 tablet (4 mg total) by mouth every 8 (eight) hours as needed for nausea or vomiting.  pantoprazole 40 MG tablet Commonly known as:  PROTONIX Take 1 tablet (40 mg total) by mouth daily.   potassium chloride SA 20 MEQ tablet Commonly known as:  K-DUR,KLOR-CON Take 2 tablets (40 mEq total) by mouth daily.       Day of Discharge BP (!) 142/101 (BP Location: Right Arm)   Pulse 84   Temp 98.7 F (37.1 C) (Oral)   Resp 16   Ht 5' 3"  (1.6 m)   Wt 57.7 kg (127 lb 1.6 oz)   LMP 09/04/2016   SpO2 99%    BMI 22.51 kg/m   Physical Exam: General: No acute respiratory distress Lungs: Clear to auscultation bilaterally without wheezes or crackles Cardiovascular: Regular rate and rhythm without murmur gallop or rub normal S1 and S2 Abdomen: Nontender, nondistended, soft, bowel sounds positive, no rebound, no ascites, no appreciable mass Extremities: No significant cyanosis, clubbing, or edema bilateral lower extremities  Basic Metabolic Panel:  Recent Labs Lab 09/06/16 0457 09/07/16 0347 09/08/16 0344 09/08/16 1519 09/09/16 0329  NA 141 139 140 139 139  K 3.0* 3.0* 2.4* 2.8* 3.0*  CL 114* 110 106 106 106  CO2 16* 21* 25 27 25   GLUCOSE 86 86 102* 118* 112*  BUN 14 5* <5* <5* <5*  CREATININE 0.94 0.75 0.63 0.73 0.69  CALCIUM 7.8* 8.1* 8.0* 8.2* 8.5*  MG  --  1.5* 2.1  --  1.8  PHOS  --  1.9* 4.1  --  3.2    Liver Function Tests:  Recent Labs Lab 09/05/16 0032 09/05/16 1151 09/07/16 0347 09/08/16 0344 09/09/16 0329  AST 314* 211* 86* 61* 43*  ALT 98* 69* 42 35 33  ALKPHOS 61 42 40 45 46  BILITOT 2.1* 1.8* 1.0 0.7 0.4  PROT 8.1 6.2* 5.6* 5.6* 6.1*  ALBUMIN 4.5 3.5 3.0* 2.9* 3.1*    Recent Labs Lab 09/05/16 0032 09/06/16 0457 09/08/16 0344  LIPASE 686* 811* 551*    Recent Labs Lab 09/05/16 0032  AMMONIA 32    Coags:  Recent Labs Lab 09/05/16 0032  INR 1.18    CBC:  Recent Labs Lab 09/05/16 0032  09/05/16 0544  09/05/16 2250 09/06/16 0457 09/07/16 0347 09/08/16 0344 09/09/16 0329  WBC 16.0*  --  12.7*  --   --  9.9 7.0 5.3 5.1  NEUTROABS 14.9*  --   --   --   --   --   --   --   --   HGB 10.9*  < > 9.2*  < > 8.7* 8.1* 7.7* 7.7* 7.7*  HCT 36.2  < > 30.1*  < > 29.0* 26.9* 25.0* 24.2* 24.3*  MCV 94.8  --  94.1  --   --  94.4 91.2 90.3 89.7  PLT 348  --  275  --   --  228 230 257 289  < > = values in this interval not displayed.   Recent Results (from the past 240 hour(s))  Blood Culture (routine x 2)     Status: None (Preliminary result)    Collection Time: 09/05/16  2:00 AM  Result Value Ref Range Status   Specimen Description BLOOD LEFT ARM  Final   Special Requests BOTTLES DRAWN AEROBIC AND ANAEROBIC 5ML  Final   Culture NO GROWTH 3 DAYS  Final   Report Status PENDING  Incomplete  Blood Culture (routine x 2)     Status: None (Preliminary result)   Collection Time: 09/05/16  2:04 AM  Result Value Ref  Range Status   Specimen Description BLOOD LEFT HAND  Final   Special Requests BOTTLES DRAWN AEROBIC AND ANAEROBIC 5ML  Final   Culture NO GROWTH 3 DAYS  Final   Report Status PENDING  Incomplete  MRSA PCR Screening     Status: None   Collection Time: 09/05/16  4:39 AM  Result Value Ref Range Status   MRSA by PCR NEGATIVE NEGATIVE Final    Comment:        The GeneXpert MRSA Assay (FDA approved for NASAL specimens only), is one component of a comprehensive MRSA colonization surveillance program. It is not intended to diagnose MRSA infection nor to guide or monitor treatment for MRSA infections.      Time spent in discharge (includes decision making & examination of pt): 35 minutes  09/09/2016, 11:19 AM   Cherene Altes, MD [facility] Office  [phone] Pager [phone]  On-Call/Text Page:      [name]      password [facility]

## 2016-09-10 LAB — CULTURE, BLOOD (ROUTINE X 2)
Culture: NO GROWTH
Culture: NO GROWTH

## 2016-09-10 NOTE — Care Management Note (Signed)
Case Management Note  Patient Details  Name: Susan [name] MRN: [id number] Date of Birth: Jun 16, 1980  Subjective/Objective:  Acute pancreatitic, and gib, patient dc , NCM was not notified,  NCM scheduled a follow up apt for patient at the [facility] fo 4/5,  NCM called patient on mobile and left message regarding hospital follow up apt with phone number.  Will try to reach patient again.                     Action/Plan:   Expected Discharge Date:  09/09/16               Expected Discharge Plan:  Home/Self Care  In-House Referral:     Discharge planning Services  CM Consult, Follow-up appt scheduled  Post Acute Care Choice:    Choice offered to:     DME Arranged:    DME Agency:     HH Arranged:    HH Agency:     Status of Service:  Completed, signed off  If discussed at [employer] of Stay Meetings, dates discussed:    Additional Comments:  Leone Haven, RN 09/10/2016, 10:58 AM

## 2016-09-12 ENCOUNTER — Inpatient Hospital Stay: Payer: Self-pay

## 2016-11-04 ENCOUNTER — Encounter (HOSPITAL_COMMUNITY): Payer: Self-pay | Admitting: Neurology

## 2016-11-04 ENCOUNTER — Emergency Department (HOSPITAL_COMMUNITY)
Admission: EM | Admit: 2016-11-04 | Discharge: 2016-11-04 | Disposition: A | Discharge status: Home / Self Care | Payer: No Typology Code available for payment source | Attending: Emergency Medicine | Admitting: Emergency Medicine

## 2016-11-04 DIAGNOSIS — R1013 Epigastric pain: Secondary | ICD-10-CM | POA: Diagnosis present

## 2016-11-04 DIAGNOSIS — R112 Nausea with vomiting, unspecified: Secondary | ICD-10-CM | POA: Diagnosis not present

## 2016-11-04 DIAGNOSIS — R1010 Upper abdominal pain, unspecified: Secondary | ICD-10-CM

## 2016-11-04 DIAGNOSIS — Z91040 Latex allergy status: Secondary | ICD-10-CM | POA: Diagnosis not present

## 2016-11-04 DIAGNOSIS — N3000 Acute cystitis without hematuria: Secondary | ICD-10-CM

## 2016-11-04 DIAGNOSIS — Z87891 Personal history of nicotine dependence: Secondary | ICD-10-CM | POA: Insufficient documentation

## 2016-11-04 DIAGNOSIS — Z79899 Other long term (current) drug therapy: Secondary | ICD-10-CM | POA: Diagnosis not present

## 2016-11-04 HISTORY — DX: Acute pancreatitis without necrosis or infection, unspecified: K85.90

## 2016-11-04 LAB — CBC
HCT: 33 % — ABNORMAL LOW (ref 36.0–46.0)
Hemoglobin: 10.2 g/dL — ABNORMAL LOW (ref 12.0–15.0)
MCH: 25.9 pg — ABNORMAL LOW (ref 26.0–34.0)
MCHC: 30.9 g/dL (ref 30.0–36.0)
MCV: 83.8 fL (ref 78.0–100.0)
Platelets: 469 10*3/uL — ABNORMAL HIGH (ref 150–400)
RBC: 3.94 MIL/uL (ref 3.87–5.11)
RDW: 19.5 % — ABNORMAL HIGH (ref 11.5–15.5)
WBC: 10.4 10*3/uL (ref 4.0–10.5)

## 2016-11-04 LAB — URINALYSIS, ROUTINE W REFLEX MICROSCOPIC
Bilirubin Urine: NEGATIVE
Glucose, UA: NEGATIVE mg/dL
Ketones, ur: 20 mg/dL — AB
Nitrite: POSITIVE — AB
Protein, ur: 100 mg/dL — AB
Specific Gravity, Urine: 1.029 (ref 1.005–1.030)
pH: 5 (ref 5.0–8.0)

## 2016-11-04 LAB — COMPREHENSIVE METABOLIC PANEL
ALT: 32 U/L (ref 14–54)
AST: 72 U/L — ABNORMAL HIGH (ref 15–41)
Albumin: 4.4 g/dL (ref 3.5–5.0)
Alkaline Phosphatase: 67 U/L (ref 38–126)
Anion gap: 14 (ref 5–15)
BUN: 9 mg/dL (ref 6–20)
CO2: 21 mmol/L — ABNORMAL LOW (ref 22–32)
Calcium: 9.3 mg/dL (ref 8.9–10.3)
Chloride: 101 mmol/L (ref 101–111)
Creatinine, Ser: 0.86 mg/dL (ref 0.44–1.00)
GFR calc Af Amer: >60 mL/min (ref >60)
GFR calc non Af Amer: >60 mL/min (ref >60)
Glucose, Bld: 165 mg/dL — ABNORMAL HIGH (ref 65–99)
Potassium: 4.2 mmol/L (ref 3.5–5.1)
Sodium: 136 mmol/L (ref 135–145)
Total Bilirubin: 1.4 mg/dL — ABNORMAL HIGH (ref 0.3–1.2)
Total Protein: 8.3 g/dL — ABNORMAL HIGH (ref 6.5–8.1)

## 2016-11-04 LAB — I-STAT BETA HCG BLOOD, ED (MC, WL, AP ONLY): I-stat hCG, quantitative: <5 m[IU]/mL (ref <5)

## 2016-11-04 LAB — LIPASE, BLOOD: Lipase: 33 U/L (ref 11–51)

## 2016-11-04 MED ORDER — ONDANSETRON 4 MG PO TBDP
4.0000 mg | ORAL_TABLET | Freq: Once | ORAL | Status: AC | PRN
  Administered 2016-11-04: 4 mg via ORAL

## 2016-11-04 MED ORDER — KETOROLAC TROMETHAMINE 30 MG/ML IJ SOLN
30.0000 mg | Freq: Once | INTRAMUSCULAR | Status: AC
  Administered 2016-11-04: 30 mg via INTRAVENOUS
  Filled 2016-11-04: qty 1

## 2016-11-04 MED ORDER — NITROFURANTOIN MONOHYD MACRO 100 MG PO CAPS: 100.0000 mg | ORAL_CAPSULE | Freq: Two times a day (BID) | ORAL | 0 refills | Status: AC

## 2016-11-04 MED ORDER — ONDANSETRON 4 MG PO TBDP
4.0000 mg | ORAL_TABLET | Freq: Once | ORAL | Status: AC
  Administered 2016-11-04: 4 mg via ORAL
  Filled 2016-11-04: qty 1

## 2016-11-04 MED ORDER — GI COCKTAIL ~~LOC~~
30.0000 mL | Freq: Once | ORAL | Status: AC
  Administered 2016-11-04: 30 mL via ORAL
  Filled 2016-11-04: qty 30

## 2016-11-04 MED ORDER — ONDANSETRON 4 MG PO TBDP
ORAL_TABLET | ORAL | Status: AC
  Filled 2016-11-04: qty 1

## 2016-11-04 MED ORDER — SODIUM CHLORIDE 0.9 % IV BOLUS (SEPSIS)
1000.0000 mL | Freq: Once | INTRAVENOUS | Status: AC
  Administered 2016-11-04: 1000 mL via INTRAVENOUS

## 2016-11-04 NOTE — ED Provider Notes (Signed)
[facility]EMER[facility]ENCY DEPT Provider Note   CSN: [id number] Arrival date & time: 11/04/16  1036     History   Chief Complaint Chief Complaint  Patient presents with  . Abdominal Cramping    HPI Susan [name] is a 36 y.o. female.  Patient with past medical history of alcohol abuse, gastritis, pancreatitis, reports with gradual onset epigastric abdominal cramping that began yesterday. Patient states pain is intermittent and radiating to her back. Reports associated nausea, vomiting and intermittent shortness of breath. Reports she is currently on her period. BMs are normal, dark (on iron supplement), most recent BM this morning. Denies fever, coffee-ground emesis, chest pain, dysuria, urinary frequency. Reports she drinks anywhere from 4-12 liquor drinks per day, last drink was the day before yesterday. She takes zofran and protonix for recurrent abd sx, however has not taken any since onset of symptoms.      Past Medical History:  Diagnosis Date  . Gastritis   . Pancreatitis     Patient Active Problem List   Diagnosis Date Noted  . GI bleed 09/05/2016  . Hyperglycemia 06/04/2016    Past Surgical History:  Procedure Laterality Date  . ESOPHAGOGASTRODUODENOSCOPY (EGD) WITH PROPOFOL N/A 06/05/2016   Procedure: ESOPHAGOGASTRODUODENOSCOPY (EGD) WITH PROPOFOL;  Surgeon: Kathi Der, MD;  Location: [facility];  Service: Gastroenterology;  Laterality: N/A;    OB History    No data available       Home Medications    Prior to Admission medications   Medication Sig Start Date End Date Taking? Authorizing Provider  acetaminophen (TYLENOL) 325 MG tablet Take 1 tablet (325 mg total) by mouth every 6 (six) hours as needed for mild pain, fever or headache. Patient taking differently: Take 650 mg by mouth every 6 (six) hours as needed for mild pain, fever or headache.  09/09/16  Yes Lonia Blood, MD  Cyanocobalamin (B-12 PO) Take 1 tablet by mouth daily.   Yes [provider]  ferrous sulfate 325 (65 FE) MG tablet Take 325 mg by mouth daily with breakfast.   Yes [provider]  fexofenadine (ALLEGRA) 180 MG tablet Take 180 mg by mouth daily.   Yes [provider]  mometasone (NASONEX) 50 MCG/ACT nasal spray Place 2 sprays into the nose daily as needed (allergies).   Yes [provider]  Multiple Vitamin (MULTIVITAMIN WITH MINERALS) TABS tablet Take 2 tablets by mouth daily.   Yes [provider]  ondansetron (ZOFRAN ODT) 4 MG disintegrating tablet Take 1 tablet (4 mg total) by mouth every 8 (eight) hours as needed for nausea or vomiting. 09/09/16  Yes Lonia Blood, MD  pantoprazole (PROTONIX) 40 MG tablet Take 1 tablet (40 mg total) by mouth daily. 09/09/16  Yes Lonia Blood, MD  Potassium 99 MG TABS Take 99 mg by mouth daily as needed (potassium).   Yes [provider]  nitrofurantoin, macrocrystal-monohydrate, (MACROBID) 100 MG capsule Take 1 capsule (100 mg total) by mouth 2 (two) times daily. 11/04/16 11/11/16  Russo, [place] N, PA-C  potassium chloride SA (K-DUR,KLOR-CON) 20 MEQ tablet Take 2 tablets (40 mEq total) by mouth daily. Patient not taking: Reported on 11/04/2016 09/09/16   Lonia Blood, MD    Family History No family history on file.  Social History Social History  Substance Use Topics  . Smoking status: Former Games developer  . Smokeless tobacco: Never Used  . Alcohol use Yes     Comment: occasional     Allergies  Latex   Review of Systems Review of Systems  Constitutional: Negative for fever.  HENT: Negative for sore throat.   Respiratory: Positive for shortness of breath.   Cardiovascular: Negative for chest pain.  Gastrointestinal: Positive for abdominal pain, nausea and vomiting.  Genitourinary: Negative for dysuria, flank pain and frequency.  Musculoskeletal: Positive for back pain.  Skin: Negative for color change.  Allergic/Immunologic: Negative for  immunocompromised state.  Neurological: Negative for headaches.     Physical Exam Updated Vital Signs BP (!) 130/97   Pulse 95   Temp 98.5 F (36.9 C) (Oral)   Resp 17   Ht 5\' 2"  (1.575 m)   Wt 54.4 kg (120 lb)   LMP 11/04/2016   SpO2 100%   BMI 21.95 kg/m   Physical Exam  Constitutional: She appears well-developed and well-nourished.  HENT:  Head: Normocephalic and atraumatic.  Eyes: Conjunctivae are normal.  Neck: Normal range of motion.  Cardiovascular: Regular rhythm, normal heart sounds and intact distal pulses.  Exam reveals no friction rub.   No murmur heard. Slightly tachycardic  Pulmonary/Chest: Effort normal and breath sounds normal. No respiratory distress. She has no wheezes. She has no rales.  Abdominal: Soft. Normal appearance and bowel sounds are normal. She exhibits no distension. There is tenderness in the epigastric area, periumbilical area and left upper quadrant. There is no rebound, no guarding, no CVA tenderness, no tenderness at McBurney's point and negative Murphy's sign.  Neurological: She is alert.  Skin: Skin is warm. No pallor.  Psychiatric: She has a normal mood and affect. Her behavior is normal.  Nursing note and vitals reviewed.    ED Treatments / Results  Labs (all labs ordered are listed, but only abnormal results are displayed) Labs Reviewed  COMPREHENSIVE METABOLIC PANEL - Abnormal; Notable for the following:       Result Value   CO2 21 (*)    Glucose, Bld 165 (*)    Total Protein 8.3 (*)    AST 72 (*)    Total Bilirubin 1.4 (*)    All other components within normal limits  CBC - Abnormal; Notable for the following:    Hemoglobin 10.2 (*)    HCT 33.0 (*)    MCH 25.9 (*)    RDW 19.5 (*)    Platelets 469 (*)    All other components within normal limits  URINALYSIS, ROUTINE W REFLEX MICROSCOPIC - Abnormal; Notable for the following:    Color, Urine AMBER (*)    APPearance TURBID (*)    Hgb urine dipstick LARGE (*)     Ketones, ur 20 (*)    Protein, ur 100 (*)    Nitrite POSITIVE (*)    Leukocytes, UA SMALL (*)    Bacteria, UA MANY (*)    Squamous Epithelial / LPF 6-30 (*)    All other components within normal limits  URINE CULTURE  LIPASE, BLOOD  I-STAT BETA HCG BLOOD, ED ([facility], [facility], [facility] ONLY)    EKG  EKG Interpretation None       Radiology No results found.  Procedures Procedures (including critical care time)  Medications Ordered in ED Medications  ondansetron (ZOFRAN-ODT) disintegrating tablet 4 mg (4 mg Oral Given 11/04/16 1102)  sodium chloride 0.9 % bolus 1,000 mL (0 mLs Intravenous Stopped 11/04/16 1523)  ketorolac (TORADOL) 30 MG/ML injection 30 mg (30 mg Intravenous Given 11/04/16 1312)  ondansetron (ZOFRAN-ODT) disintegrating tablet 4 mg (4 mg Oral Given 11/04/16 1312)  gi cocktail (Maalox,Lidocaine,Donnatal) (30  mLs Oral Given 11/04/16 1338)     Initial Impression / Assessment and Plan / ED Course  I have reviewed the triage vital signs and the nursing notes.  Pertinent labs & imaging results that were available during my care of the patient were reviewed by me and considered in my medical decision making (see chart for details).     Patient with epigastric abd pain and N/V, symptoms consistent with gastritis vs PUD. Patient afebrile, VSS. Epigastric & left upper quadrant tenderness, no peritoneal signs. Lipase wnl, CBC & CMP unchanged from baseline. Neg bhcg. U/A consistent w UTI. Symptoms improved w GI cocktail. Fluid bolus given. Patient does not meet the SIRS or Sepsis criteria.  On repeat exam patient does not have a surgical abdomen and there are no peritoneal signs.  No indication of acute abdominal pathology.  Will send w Macrobid for UTI. Patient discharged home with symptomatic treatment and given strict instructions for follow-up with their primary care physician. Substance abuse resource packet given. Pt safe for discharge.  Patient discussed with Dr. Juleen China, who agrees w  care plan. Discussed results, findings, treatment and follow up. Patient advised of return precautions. Patient verbalized understanding and agreed with plan.   Final Clinical Impressions(s) / ED Diagnoses   Final diagnoses:  Pain of upper abdomen  Nausea and vomiting in adult  Acute cystitis without hematuria    New Prescriptions Discharge Medication List as of 11/04/2016  3:13 PM       Russo, [place] N, PA-C 11/04/16 2017    Raeford Razor, MD 11/07/16 1820

## 2016-11-04 NOTE — ED Notes (Signed)
Pt ambulated to restroom from room, tolerated well. 

## 2016-11-04 NOTE — Discharge Instructions (Signed)
Please read instructions below. Avoid NSAIDs, such as Advil or Aleve, as these are rough on your stomach. Start slowly with drinking water, eating and bland foods. Take your antibiotic as prescribed until they are gone. Continue taking your Protonix and Zofran for stomach upset. Schedule an appointment with the gastroenterologist to establish care and follow-up on your ongoing symptoms. Return to the ER for new or concerning symptoms.

## 2016-11-04 NOTE — ED Triage Notes (Signed)
Pt here reporting abdominal cramping n/v for several days. Has hx of gastritis and pancreatitis. Is also on her period. Is a x 4.

## 2016-11-04 NOTE — ED Notes (Signed)
ED Provider at bedside. 

## 2016-11-04 NOTE — ED Notes (Signed)
Pt stated she was unable to provide urine sample at this time. 

## 2016-11-04 NOTE — ED Notes (Signed)
Attempted to start line, unsuccessful. Will have Jeanice Lim, RN start IV.

## 2016-11-04 NOTE — ED Notes (Signed)
Pt reports she still has mild nausea but no vomiting. Pt reports she still has pressure in her abdomen. [place], [place] aware.

## 2016-11-05 ENCOUNTER — Telehealth: Payer: Self-pay | Admitting: *Deleted

## 2016-11-05 NOTE — Telephone Encounter (Signed)
Pharmacy called related to Rx: ondansetron (ZOFRAN ODT) 4 MGondansetron (ZOFRAN ODT) 4 MG .Marland KitchenMarland KitchenEDCM clarified with EDP (Little) to change Rx to: disintegrating tablet.

## 2016-11-06 LAB — URINE CULTURE

## 2016-11-07 ENCOUNTER — Telehealth: Payer: Self-pay | Admitting: *Deleted

## 2016-11-07 NOTE — Telephone Encounter (Signed)
Post ED Visit - Positive Culture Follow-up  Culture report reviewed by antimicrobial stewardship pharmacist:  []  Enzo Bi, Pharm.D. [x]  Celedonio Miyamoto, Pharm.D., BCPS AQ-ID []  Garvin Fila, Pharm.D., BCPS []  Georgina Pillion, Pharm.D., BCPS []  Daniel, [address].D., BCPS, AAHIVP []  Estella Husk, Pharm.D., BCPS, AAHIVP []  Lysle Pearl, PharmD, BCPS []  Casilda Carls, PharmD, BCPS []  Pollyann Samples, PharmD, BCPS  Positive urine culture Treated with Nitrofurantoin Macro, organism sensitive to the same and no further patient follow-up is required at this time.  Virl Axe [facility] 11/07/2016, 2:29 PM

## 2017-01-27 ENCOUNTER — Emergency Department (HOSPITAL_COMMUNITY): Payer: Self-pay

## 2017-01-27 ENCOUNTER — Emergency Department (HOSPITAL_COMMUNITY)
Admission: EM | Admit: 2017-01-27 | Discharge: 2017-01-27 | Disposition: A | Discharge status: Home / Self Care | Payer: Self-pay | Attending: Emergency Medicine | Admitting: Emergency Medicine

## 2017-01-27 DIAGNOSIS — Z87891 Personal history of nicotine dependence: Secondary | ICD-10-CM | POA: Insufficient documentation

## 2017-01-27 DIAGNOSIS — R112 Nausea with vomiting, unspecified: Secondary | ICD-10-CM | POA: Insufficient documentation

## 2017-01-27 DIAGNOSIS — Z79899 Other long term (current) drug therapy: Secondary | ICD-10-CM | POA: Insufficient documentation

## 2017-01-27 DIAGNOSIS — R1031 Right lower quadrant pain: Secondary | ICD-10-CM | POA: Insufficient documentation

## 2017-01-27 LAB — COMPREHENSIVE METABOLIC PANEL
ALK PHOS: 60 U/L (ref 38–126)
ALT: 37 U/L (ref 14–54)
ANION GAP: 16 — AB (ref 5–15)
AST: 86 U/L — ABNORMAL HIGH (ref 15–41)
Albumin: 3.8 g/dL (ref 3.5–5.0)
BILIRUBIN TOTAL: 0.4 mg/dL (ref 0.3–1.2)
BUN: 11 mg/dL (ref 6–20)
CALCIUM: 8.9 mg/dL (ref 8.9–10.3)
CO2: 19 mmol/L — AB (ref 22–32)
Chloride: 101 mmol/L (ref 101–111)
Creatinine, Ser: 0.75 mg/dL (ref 0.44–1.00)
Glucose, Bld: 129 mg/dL — ABNORMAL HIGH (ref 65–99)
Potassium: 4.3 mmol/L (ref 3.5–5.1)
SODIUM: 136 mmol/L (ref 135–145)
TOTAL PROTEIN: 7.8 g/dL (ref 6.5–8.1)

## 2017-01-27 LAB — URINALYSIS, ROUTINE W REFLEX MICROSCOPIC
Bilirubin Urine: NEGATIVE
GLUCOSE, UA: NEGATIVE mg/dL
HGB URINE DIPSTICK: NEGATIVE
KETONES UR: 80 mg/dL — AB
LEUKOCYTES UA: NEGATIVE
NITRITE: NEGATIVE
PH: 5 (ref 5.0–8.0)
PROTEIN: 100 mg/dL — AB
Specific Gravity, Urine: 1.032 — ABNORMAL HIGH (ref 1.005–1.030)

## 2017-01-27 LAB — CBC
HCT: 32.8 % — ABNORMAL LOW (ref 36.0–46.0)
HEMOGLOBIN: 10.3 g/dL — AB (ref 12.0–15.0)
MCH: 24.2 pg — ABNORMAL LOW (ref 26.0–34.0)
MCHC: 31.4 g/dL (ref 30.0–36.0)
MCV: 77.2 fL — ABNORMAL LOW (ref 78.0–100.0)
PLATELETS: 269 10*3/uL (ref 150–400)
RBC: 4.25 MIL/uL (ref 3.87–5.11)
RDW: 18.6 % — ABNORMAL HIGH (ref 11.5–15.5)
WBC: 8.2 10*3/uL (ref 4.0–10.5)

## 2017-01-27 LAB — I-STAT BETA HCG BLOOD, ED (MC, WL, AP ONLY): I-stat hCG, quantitative: <5 m[IU]/mL (ref <5)

## 2017-01-27 LAB — POC URINE PREG, ED: Preg Test, Ur: NEGATIVE

## 2017-01-27 LAB — LIPASE, BLOOD: Lipase: 23 U/L (ref 11–51)

## 2017-01-27 MED ORDER — HALOPERIDOL LACTATE 5 MG/ML IJ SOLN
3.0000 mg | Freq: Once | INTRAMUSCULAR | Status: AC
  Administered 2017-01-27: 3 mg via INTRAVENOUS
  Filled 2017-01-27: qty 1

## 2017-01-27 MED ORDER — ONDANSETRON 4 MG PO TBDP: 4.0000 mg | ORAL_TABLET | Freq: Three times a day (TID) | ORAL | 0 refills | Status: DC | PRN

## 2017-01-27 MED ORDER — SODIUM CHLORIDE 0.9 % IV BOLUS (SEPSIS)
1000.0000 mL | Freq: Once | INTRAVENOUS | Status: AC
  Administered 2017-01-27: 1000 mL via INTRAVENOUS

## 2017-01-27 MED ORDER — IOPAMIDOL (ISOVUE-300) INJECTION 61%
INTRAVENOUS | Status: AC
  Administered 2017-01-27: 100 mL
  Filled 2017-01-27: qty 100

## 2017-01-27 MED ORDER — MORPHINE SULFATE (PF) 2 MG/ML IV SOLN
4.0000 mg | Freq: Once | INTRAVENOUS | Status: AC
  Administered 2017-01-27: 4 mg via INTRAVENOUS
  Filled 2017-01-27: qty 2

## 2017-01-27 MED ORDER — ONDANSETRON HCL 4 MG/2ML IJ SOLN
4.0000 mg | Freq: Once | INTRAMUSCULAR | Status: AC
  Administered 2017-01-27: 4 mg via INTRAVENOUS
  Filled 2017-01-27: qty 2

## 2017-01-27 MED ORDER — DICYCLOMINE HCL 20 MG PO TABS: 20.0000 mg | ORAL_TABLET | Freq: Two times a day (BID) | ORAL | 0 refills | Status: DC

## 2017-01-27 NOTE — ED Notes (Signed)
Pt tolerated PO challenge

## 2017-01-27 NOTE — ED Provider Notes (Signed)
[facility] Provider Note   CSN: [id number] Arrival date & time: 01/27/17  1439     History   Chief Complaint Chief Complaint  Patient presents with  . Emesis    HPI Susan [name] is a 36 y.o. female.  HPI   Developed right sided abdominal pain 3 days ago, woke from sleep. Has some lower left pain too but not as much or as severe as on right.  Sharp stabbing pain. Nothing makes it better or worse. Comes and goes, is always there but is not always sharp.  Also had back pain yesterday.  Nausea has been present over the last 2 days, yesterday vomited 5 times. Today greater than 5 times, has not had any nausea medication.  No diarrhea. On iron pills so mild chronic constipation, had BM this morning.  Has not had any fevers, was 99 at doctors office. Feeling hot and cold.  No urinary symptoms. Emesis yellow-green.  Vaginal bleeding has been present but stopped yesterday. Over the last week she has been in bed, not working, is typically active. Does not smoke marijuana but does use CBD oil.  No other drugs. Used to have problems with etoh, had minimal etoh last week.    Has had vaginal bleeding for the last few months, has been declining over the last week, had syncope this weekend in bathroom, and feeling lightheaded.  Had cramping pain with the bleeding, but pain bringing her in today is different.   Saw OBGYN who sent her here for evaluation for appy vs dehydration/emesis.  Had TVUS that showed no sign of ovarian cysts.   Past Medical History:  Diagnosis Date  . Gastritis   . Pancreatitis     Patient Active Problem List   Diagnosis Date Noted  . GI bleed 09/05/2016  . Hyperglycemia 06/04/2016    Past Surgical History:  Procedure Laterality Date  . ESOPHAGOGASTRODUODENOSCOPY (EGD) WITH PROPOFOL N/A 06/05/2016   Procedure: ESOPHAGOGASTRODUODENOSCOPY (EGD) WITH PROPOFOL;  Surgeon: Kathi Der, MD;  Location: [facility];  Service: Gastroenterology;  Laterality:  N/A;    OB History    No data available       Home Medications    Prior to Admission medications   Medication Sig Start Date End Date Taking? Authorizing Provider  acetaminophen (TYLENOL) 325 MG tablet Take 1 tablet (325 mg total) by mouth every 6 (six) hours as needed for mild pain, fever or headache. Patient taking differently: Take 650 mg by mouth every 6 (six) hours as needed for mild pain, fever or headache.  09/09/16  Yes Lonia Blood, MD  Cyanocobalamin (B-12 PO) Take 1 tablet by mouth daily.   Yes [provider]  esomeprazole (NEXIUM) 20 MG capsule Take 20 mg by mouth daily at 12 noon.   Yes [provider]  ferrous sulfate 325 (65 FE) MG tablet Take 325 mg by mouth daily with breakfast.   Yes [provider]  fexofenadine (ALLEGRA) 180 MG tablet Take 180 mg by mouth daily.   Yes [provider]  magnesium oxide (MAG-OX) 400 MG tablet TAKE 1 DAILY TABLET WITH IRON 01/24/17  Yes [provider]  Multiple Vitamin (MULTIVITAMIN WITH MINERALS) TABS tablet Take 2 tablets by mouth daily.   Yes [provider]  ondansetron (ZOFRAN ODT) 4 MG disintegrating tablet Take 1 tablet (4 mg total) by mouth every 8 (eight) hours as needed for nausea or vomiting. 09/09/16  Yes Lonia Blood, MD  SPRINTEC 28 0.25-35 MG-MCG  tablet Take 1 tablet by mouth daily. 01/24/17  Yes [provider]  pantoprazole (PROTONIX) 40 MG tablet Take 1 tablet (40 mg total) by mouth daily. Patient not taking: Reported on 01/27/2017 09/09/16   Lonia Blood, MD  potassium chloride SA (K-DUR,KLOR-CON) 20 MEQ tablet Take 2 tablets (40 mEq total) by mouth daily. Patient not taking: Reported on 11/04/2016 09/09/16   Lonia Blood, MD    Family History No family history on file.  Social History Social History  Substance Use Topics  . Smoking status: Former Games developer  . Smokeless tobacco: Never Used  . Alcohol use Yes     Comment: occasional      Allergies   Latex   Review of Systems Review of Systems  Constitutional: Negative for fever.  HENT: Negative for sore throat.   Eyes: Negative for visual disturbance.  Respiratory: Negative for cough and shortness of breath.   Cardiovascular: Negative for chest pain.  Gastrointestinal: Positive for abdominal pain, nausea and vomiting.  Genitourinary: Negative for difficulty urinating.  Musculoskeletal: Negative for back pain and neck pain.  Skin: Negative for rash.  Neurological: Negative for syncope and headaches.     Physical Exam Updated Vital Signs BP (!) 135/94 (BP Location: Left Arm)   Pulse (!) 106   Temp 98.7 F (37.1 C) (Oral)   Resp 14   Ht 5\' 2"  (1.575 m)   Wt 60.8 kg (134 lb)   LMP 09/27/2016 Comment: bled for 4 months  SpO2 100%   BMI 24.51 kg/m   Physical Exam  Constitutional: She is oriented to person, place, and time. She appears well-developed and well-nourished. No distress.  HENT:  Head: Normocephalic and atraumatic.  Eyes: Conjunctivae and EOM are normal.  Neck: Normal range of motion.  Cardiovascular: Normal rate, regular rhythm, normal heart sounds and intact distal pulses.  Exam reveals no gallop and no friction rub.   No murmur heard. Pulmonary/Chest: Effort normal and breath sounds normal. No respiratory distress. She has no wheezes. She has no rales.  Abdominal: Soft. She exhibits no distension. There is tenderness in the right lower quadrant. There is tenderness at McBurney's point. There is no guarding, no CVA tenderness and negative Murphy's sign.  Musculoskeletal: She exhibits no edema or tenderness.  Neurological: She is alert and oriented to person, place, and time.  Skin: Skin is warm and dry. No rash noted. She is not diaphoretic. No erythema.  Nursing note and vitals reviewed.    ED Treatments / Results  Labs (all labs ordered are listed, but only abnormal results are displayed) Labs Reviewed  COMPREHENSIVE METABOLIC  PANEL - Abnormal; Notable for the following:       Result Value   CO2 19 (*)    Glucose, Bld 129 (*)    AST 86 (*)    Anion gap 16 (*)    All other components within normal limits  CBC - Abnormal; Notable for the following:    Hemoglobin 10.3 (*)    HCT 32.8 (*)    MCV 77.2 (*)    MCH 24.2 (*)    RDW 18.6 (*)    All other components within normal limits  URINALYSIS, ROUTINE W REFLEX MICROSCOPIC - Abnormal; Notable for the following:    APPearance HAZY (*)    Specific Gravity, Urine 1.032 (*)    Ketones, ur 80 (*)    Protein, ur 100 (*)    Bacteria, UA RARE (*)    Squamous Epithelial / LPF  0-5 (*)    All other components within normal limits  LIPASE, BLOOD  I-STAT BETA HCG BLOOD, ED ([facility], [facility], [facility] ONLY)  POC URINE PREG, ED    EKG  EKG Interpretation None       Radiology Ct Abdomen Pelvis W Contrast  Result Date: 01/27/2017 CLINICAL DATA:  Right lower quadrant abdominal pain. Vaginal bleeding for 4 months. Nausea and vomiting for 1 week. EXAM: CT ABDOMEN AND PELVIS WITH CONTRAST TECHNIQUE: Multidetector CT imaging of the abdomen and pelvis was performed using the standard protocol following bolus administration of intravenous contrast. CONTRAST:  ISOVUE-300 IOPAMIDOL (ISOVUE-300) INJECTION 61% COMPARISON:  CT scan 06/04/2016.  Abdominal ultrasound 09/05/2016 FINDINGS: Lower chest: Unremarkable Hepatobiliary: Mild focal fatty infiltration along the falciform ligament. Gallbladder unremarkable. Pancreas: Unremarkable Spleen: Unremarkable Adrenals/Urinary Tract: Unremarkable Stomach/Bowel: Appendix normal. The formed stool in the distal colon. No findings of colitis. Vascular/Lymphatic: Unremarkable Reproductive: Several myometrial lesions including an anterior uterine wall 1.1 cm hypodense lesion, and a posterior fundal subserosal lesion measuring up to 2.4 cm, are compatible with uterine fibroids. Endometrial canal thickness 4 mm. Adnexa unremarkable. Other: No supplemental  non-categorized findings. Musculoskeletal: Unremarkable IMPRESSION: 1. A cause for the patient's right lower quadrant abdominal pain is not identified. The terminal ileum and appendix appear normal. 2. Several relatively small uterine fibroids are observed. Endometrial thickness is normal. Adnexa unremarkable. Electronically Signed   By: Gaylyn Rong M.D.   On: 01/27/2017 18:13    Procedures Procedures (including critical care time)  Medications Ordered in ED Medications  haloperidol lactate (HALDOL) injection 3 mg (not administered)  sodium chloride 0.9 % bolus 1,000 mL (0 mLs Intravenous Stopped 01/27/17 1947)  ondansetron (ZOFRAN) injection 4 mg (4 mg Intravenous Given 01/27/17 1638)  sodium chloride 0.9 % bolus 1,000 mL (0 mLs Intravenous Stopped 01/27/17 1802)  morphine 2 MG/ML injection 4 mg (4 mg Intravenous Given 01/27/17 1649)  iopamidol (ISOVUE-300) 61 % injection (100 mLs  Contrast Given 01/27/17 1747)     Initial Impression / Assessment and Plan / ED Course  I have reviewed the triage vital signs and the nursing notes.  Pertinent labs & imaging results that were available during my care of the patient were reviewed by me and considered in my medical decision making (see chart for details).     36yo female with history of gastritis, pancreatitis, presents with concern for RLQ abdominal pain present for 3 days from her OBGYN office.  Pregnancy test negative. Patient had ultrasound done at her OB/GYN office which showed no sign of the ovarian cysts, OB/GYN did not have suspicion for ovarian torsion, PID, TOA.  Pain in right lower quadrant and n/v differential includes nephrolithiasis, appendicitis, cannabinoid hyperemesis from CBD oil or viral. CT abdomen and pelvis was ordered which showed small uterine fibroids, no other abnormalities..  Given zofran, IV fluids and morphine with improvement in symptoms but some continuing nausea.  Given haldol 3mg  IV.  Able to tolerate fluids.   Rec stopping CBD oil. Given rx for zofran, bentyl.    Final Clinical Impressions(s) / ED Diagnoses   Final diagnoses:  Right lower quadrant abdominal pain  Non-intractable vomiting with nausea, unspecified vomiting type    New Prescriptions New Prescriptions   No medications on file     Alvira Monday, MD 01/27/17 [phone]

## 2017-01-27 NOTE — ED Triage Notes (Signed)
Pt sent in for further eval by her MD. RLQ pain. Vaginal bleeding x 4 months. N/V x 1 week. Alert and oriented. Tachycardic.

## 2017-01-27 NOTE — ED Notes (Signed)
MD in with pt

## 2017-01-27 NOTE — ED Notes (Signed)
Bed: [facility]01 Expected date:  Expected time:  Means of arrival:  Comments: 

## 2017-01-27 NOTE — ED Notes (Signed)
Pt reports that she has had a HA x 1 week,.

## 2017-02-28 ENCOUNTER — Emergency Department (HOSPITAL_BASED_OUTPATIENT_CLINIC_OR_DEPARTMENT_OTHER): Payer: PRIVATE HEALTH INSURANCE

## 2017-02-28 ENCOUNTER — Encounter (HOSPITAL_BASED_OUTPATIENT_CLINIC_OR_DEPARTMENT_OTHER): Payer: Self-pay

## 2017-02-28 ENCOUNTER — Emergency Department (HOSPITAL_BASED_OUTPATIENT_CLINIC_OR_DEPARTMENT_OTHER)
Admission: EM | Admit: 2017-02-28 | Discharge: 2017-02-28 | Disposition: A | Discharge status: Home / Self Care | Payer: PRIVATE HEALTH INSURANCE | Attending: Emergency Medicine | Admitting: Emergency Medicine

## 2017-02-28 DIAGNOSIS — K2900 Acute gastritis without bleeding: Secondary | ICD-10-CM

## 2017-02-28 DIAGNOSIS — Z87891 Personal history of nicotine dependence: Secondary | ICD-10-CM | POA: Diagnosis not present

## 2017-02-28 DIAGNOSIS — Z79899 Other long term (current) drug therapy: Secondary | ICD-10-CM | POA: Insufficient documentation

## 2017-02-28 DIAGNOSIS — R109 Unspecified abdominal pain: Secondary | ICD-10-CM

## 2017-02-28 DIAGNOSIS — Z91040 Latex allergy status: Secondary | ICD-10-CM | POA: Insufficient documentation

## 2017-02-28 DIAGNOSIS — R1030 Lower abdominal pain, unspecified: Secondary | ICD-10-CM | POA: Diagnosis present

## 2017-02-28 DIAGNOSIS — R200 Anesthesia of skin: Secondary | ICD-10-CM

## 2017-02-28 DIAGNOSIS — G629 Polyneuropathy, unspecified: Secondary | ICD-10-CM | POA: Diagnosis not present

## 2017-02-28 LAB — COMPREHENSIVE METABOLIC PANEL
ALK PHOS: 37 U/L — AB (ref 38–126)
ALT: 25 U/L (ref 14–54)
ANION GAP: 15 (ref 5–15)
AST: 52 U/L — ABNORMAL HIGH (ref 15–41)
Albumin: 3.9 g/dL (ref 3.5–5.0)
BILIRUBIN TOTAL: 1.5 mg/dL — AB (ref 0.3–1.2)
BUN: 9 mg/dL (ref 6–20)
CALCIUM: 9 mg/dL (ref 8.9–10.3)
CO2: 21 mmol/L — AB (ref 22–32)
Chloride: 97 mmol/L — ABNORMAL LOW (ref 101–111)
Creatinine, Ser: 0.76 mg/dL (ref 0.44–1.00)
GFR calc non Af Amer: >60 mL/min (ref >60)
GLUCOSE: 92 mg/dL (ref 65–99)
Potassium: 3.5 mmol/L (ref 3.5–5.1)
Sodium: 133 mmol/L — ABNORMAL LOW (ref 135–145)
TOTAL PROTEIN: 8.4 g/dL — AB (ref 6.5–8.1)

## 2017-02-28 LAB — ETHANOL

## 2017-02-28 LAB — CBC WITH DIFFERENTIAL/PLATELET
BASOS ABS: 0.1 10*3/uL (ref 0.0–0.1)
BASOS PCT: 1 %
EOS ABS: 0 10*3/uL (ref 0.0–0.7)
Eosinophils Relative: 0 %
HCT: 31.8 % — ABNORMAL LOW (ref 36.0–46.0)
HEMOGLOBIN: 10 g/dL — AB (ref 12.0–15.0)
Lymphocytes Relative: 34 %
Lymphs Abs: 2.6 10*3/uL (ref 0.7–4.0)
MCH: 25.3 pg — AB (ref 26.0–34.0)
MCHC: 31.4 g/dL (ref 30.0–36.0)
MCV: 80.3 fL (ref 78.0–100.0)
MONO ABS: 0.8 10*3/uL (ref 0.1–1.0)
MONOS PCT: 11 %
NEUTROS ABS: 4.1 10*3/uL (ref 1.7–7.7)
NEUTROS PCT: 54 %
Platelets: 300 10*3/uL (ref 150–400)
RBC: 3.96 MIL/uL (ref 3.87–5.11)
RDW: 18.8 % — ABNORMAL HIGH (ref 11.5–15.5)
WBC: 7.6 10*3/uL (ref 4.0–10.5)

## 2017-02-28 LAB — URINALYSIS, ROUTINE W REFLEX MICROSCOPIC
Glucose, UA: NEGATIVE mg/dL
Ketones, ur: >80 mg/dL — AB
LEUKOCYTES UA: NEGATIVE
NITRITE: POSITIVE — AB
pH: 6 (ref 5.0–8.0)

## 2017-02-28 LAB — URINALYSIS, MICROSCOPIC (REFLEX)

## 2017-02-28 LAB — LIPASE, BLOOD: Lipase: 29 U/L (ref 11–51)

## 2017-02-28 LAB — VITAMIN B12: VITAMIN B 12: 1035 pg/mL — AB (ref 180–914)

## 2017-02-28 LAB — PREGNANCY, URINE: PREG TEST UR: NEGATIVE

## 2017-02-28 MED ORDER — SODIUM CHLORIDE 0.9 % IV BOLUS (SEPSIS)
1000.0000 mL | Freq: Once | INTRAVENOUS | Status: AC
  Administered 2017-02-28: 1000 mL via INTRAVENOUS

## 2017-02-28 MED ORDER — DIPHENHYDRAMINE HCL 50 MG/ML IJ SOLN
INTRAMUSCULAR | Status: AC
  Administered 2017-02-28: 25 mg
  Filled 2017-02-28: qty 1

## 2017-02-28 MED ORDER — HYDROXYZINE HCL 25 MG PO TABS: 25.0000 mg | ORAL_TABLET | Freq: Four times a day (QID) | ORAL | 0 refills | Status: DC

## 2017-02-28 MED ORDER — IOPAMIDOL (ISOVUE-300) INJECTION 61%
100.0000 mL | Freq: Once | INTRAVENOUS | Status: AC | PRN
  Administered 2017-02-28: 100 mL via INTRAVENOUS

## 2017-02-28 MED ORDER — ONDANSETRON HCL 4 MG/2ML IJ SOLN
4.0000 mg | Freq: Once | INTRAMUSCULAR | Status: AC
Start: 2017-02-28 — End: 2017-02-28
  Administered 2017-02-28: 4 mg via INTRAVENOUS
  Filled 2017-02-28: qty 2

## 2017-02-28 MED ORDER — HYDROMORPHONE HCL 1 MG/ML IJ SOLN
1.0000 mg | Freq: Once | INTRAMUSCULAR | Status: AC
  Administered 2017-02-28: 1 mg via INTRAVENOUS
  Filled 2017-02-28: qty 1

## 2017-02-28 MED ORDER — METOCLOPRAMIDE HCL 10 MG PO TABS: 10.0000 mg | ORAL_TABLET | Freq: Four times a day (QID) | ORAL | 0 refills | Status: DC

## 2017-02-28 MED ORDER — OMEPRAZOLE 20 MG PO CPDR: 20.0000 mg | DELAYED_RELEASE_CAPSULE | Freq: Two times a day (BID) | ORAL | 1 refills | Status: DC

## 2017-02-28 NOTE — ED Provider Notes (Signed)
Patient seen and evaluated. Discussed with Jodi Geralds PA-C.  Patient with multiple complaints. Her abdominal pain and recent diagnosis of uterine fibroids. History of alcohol isn't. Currently still drinking daily and heavily until 2 days ago. No withdrawal symptoms now.   Had EGD December of last year showing marked edematous hemorrhagic gastritis secondary to alcohol use and negative H pylori.  Complains of back pain and leg weakness. On exam has intact DTRs. Is able to stand. No unilateral weakness. Normal strength to flex extend at the hip, knee, normal plantar flexion dorsiflexion. She has normal rectal tone. He does not have urinary retention.  CT shows fibroids. Fatty infiltration of liver. Reassuring labs.  Special. Describes leg weakness for 3 weeks. Actually improving. B-12 level is pending. Outpatient MRI of her spine ordered as she does have some degree of back pain. Does not have findings now to suggest cauda equina. Doubt Guillan-Barre, as symptoms improving and not progressive.  Advise abstinence. PPI. Reglan. Small frequent meals. GI follow-up. Describes some depression but not suicidal. Referred to [facility]. Her mother concurs with patient that she has not been suicidal does not have concerns about her. Mother had a good experience a [facility] is outpatient will refer patient there as well. Primary care follow-up. ER with acute changes.   Rolland Porter, MD 02/28/17 [phone]

## 2017-02-28 NOTE — ED Provider Notes (Signed)
[facility] Provider Note   CSN: [id number] Arrival date & time: 02/28/17  1602     History   Chief Complaint Chief Complaint  Patient presents with  . Abdominal Pain  . Emesis    HPI Susan [name] is a 36 y.o. female.  Susan [name] is a 36 y.o. Female with a history of uterine fibroids, gastritis and pancreatitis, presents with multiple complaints. Patient reports increased lower abdominal pain and vomiting for the past week. Patient denies any bloody or coffee grounds emesis. Patient describes abdominal pain as constant ache with occasional sharp pains. Patient reports similar symptoms history with her menstrual cycle. Patient was recently diagnosed with uterine fibroids with dysfunctional uterine bleeding and has been put on a new birth control. She denies vaginal bleeding, she reports a small amount of brown discharge today, possibly spotting. Denies diarrhea, last BM this morning, no blood or dark stools. Patient has a history of alcohol and reports she has been drinking heavily over the past few weeks, last drink was 48 hours ago. Patient reports she has been under increased stress, and has been feeling very depressed. Patient denies any suicidal ideation or plans to harm herself.  Patient also complains of bilateral leg weakness and numbness for the past 3 weeks. She reports she has been unable to work and has been using her moms walker at home to help stabilize herself. She reports this has improved somewhat this week, last week the numbness was worse. She reports some low back pain which she describes as a constant ache. Patient denies any issues with bowel/bladder control. Patient denies any falls or syncopal episodes. Numbness does not appear to be progressing to upper extremities. Denies fevers or chills, no hx of cancer or IVDA.        Past Medical History:  Diagnosis Date  . Gastritis   . Pancreatitis     Patient Active Problem List   Diagnosis  Date Noted  . GI bleed 09/05/2016  . Hyperglycemia 06/04/2016    Past Surgical History:  Procedure Laterality Date  . ESOPHAGOGASTRODUODENOSCOPY (EGD) WITH PROPOFOL N/A 06/05/2016   Procedure: ESOPHAGOGASTRODUODENOSCOPY (EGD) WITH PROPOFOL;  Surgeon: Kathi Der, MD;  Location: [facility];  Service: Gastroenterology;  Laterality: N/A;    OB History    No data available       Home Medications    Prior to Admission medications   Medication Sig Start Date End Date Taking? Authorizing Provider  acetaminophen (TYLENOL) 325 MG tablet Take 1 tablet (325 mg total) by mouth every 6 (six) hours as needed for mild pain, fever or headache. Patient taking differently: Take 650 mg by mouth every 6 (six) hours as needed for mild pain, fever or headache.  09/09/16   Lonia Blood, MD  Cyanocobalamin (B-12 PO) Take 1 tablet by mouth daily.    [provider]  dicyclomine (BENTYL) 20 MG tablet Take 1 tablet (20 mg total) by mouth 2 (two) times daily. 01/27/17   Alvira Monday, MD  esomeprazole (NEXIUM) 20 MG capsule Take 20 mg by mouth daily at 12 noon.    [provider]  ferrous sulfate 325 (65 FE) MG tablet Take 325 mg by mouth daily with breakfast.    [provider]  fexofenadine (ALLEGRA) 180 MG tablet Take 180 mg by mouth daily.    [provider]  magnesium oxide (MAG-OX) 400 MG tablet TAKE 1 DAILY TABLET WITH IRON 01/24/17   [provider]  Multiple Vitamin (  MULTIVITAMIN WITH MINERALS) TABS tablet Take 2 tablets by mouth daily.    [provider]  ondansetron (ZOFRAN ODT) 4 MG disintegrating tablet Take 1 tablet (4 mg total) by mouth every 8 (eight) hours as needed for nausea or vomiting. 09/09/16   Lonia Blood, MD  ondansetron (ZOFRAN ODT) 4 MG disintegrating tablet Take 1 tablet (4 mg total) by mouth every 8 (eight) hours as needed for nausea or vomiting. 01/27/17   Alvira Monday, MD  pantoprazole (PROTONIX) 40 MG tablet  Take 1 tablet (40 mg total) by mouth daily. Patient not taking: Reported on 01/27/2017 09/09/16   Lonia Blood, MD  potassium chloride SA (K-DUR,KLOR-CON) 20 MEQ tablet Take 2 tablets (40 mEq total) by mouth daily. Patient not taking: Reported on 11/04/2016 09/09/16   Lonia Blood, MD  SPRINTEC 28 0.25-35 MG-MCG tablet Take 1 tablet by mouth daily. 01/24/17   [provider]    Family History No family history on file.  Social History Social History  Substance Use Topics  . Smoking status: Former Games developer  . Smokeless tobacco: Never Used  . Alcohol use Yes     Comment: occasional     Allergies   Latex   Review of Systems Review of Systems  Constitutional: Negative for chills and fever.  HENT: Negative for congestion, ear pain, rhinorrhea, sore throat and trouble swallowing.   Eyes: Negative for photophobia and visual disturbance.  Respiratory: Negative for cough, chest tightness and shortness of breath.   Cardiovascular: Negative for chest pain, palpitations and leg swelling.  Gastrointestinal: Positive for abdominal pain, nausea and vomiting. Negative for blood in stool.  Genitourinary: Negative for difficulty urinating, dysuria, flank pain, vaginal bleeding and vaginal discharge.  Musculoskeletal: Negative for arthralgias and myalgias.  Skin: Negative for pallor and rash.  Neurological: Positive for weakness and numbness. Negative for dizziness, seizures, syncope and headaches.     Physical Exam Updated Vital Signs BP (!) 150/109 (BP Location: Left Arm)   Pulse (!) 110   Temp 98.7 F (37.1 C) (Oral)   Resp 18   Ht 5\' 2"  (1.575 m)   Wt 60.3 kg (133 lb)   LMP 02/23/2017   SpO2 100%   BMI 24.33 kg/m   Physical Exam  Constitutional: She appears well-developed and well-nourished. No distress.  HENT:  Head: Normocephalic and atraumatic.  Eyes: Pupils are equal, round, and reactive to light. EOM are normal. Right eye exhibits no discharge. Left eye  exhibits no discharge.  Cardiovascular: Regular rhythm, normal heart sounds and intact distal pulses.   Tachycardia  Pulmonary/Chest: Effort normal and breath sounds normal. No respiratory distress.  Abdominal: Soft. Bowel sounds are normal.  Lower abdominal tenderness to palpation, with some guarding, upper abdomen nontender to palpation, negative Murphy sign, no tenderness in McBurney's point, no rebound tenderness  Musculoskeletal: She exhibits no edema or deformity.  Neurological: She is alert. Coordination normal.  Speech is clear, able to follow commands CN III-XII intact Normal strength in upper and lower extremities bilaterally including dorsiflexion and plantar flexion, strong and equal grip strength Sensation normal to light and sharp touch Moves extremities without ataxia, coordination intact 2+ knee jerk and Achilles DTRs bilaterally Good rectal tone   Skin: Skin is warm and dry. Capillary refill takes less than 2 seconds. No rash noted. She is not diaphoretic. No pallor.  Psychiatric: She has a normal mood and affect. Her behavior is normal.  Nursing note and vitals reviewed.    ED Treatments /  Results  Labs (all labs ordered are listed, but only abnormal results are displayed) Labs Reviewed  URINALYSIS, ROUTINE W REFLEX MICROSCOPIC - Abnormal; Notable for the following:       Result Value   Color, Urine BROWN (*)    APPearance CLOUDY (*)    Specific Gravity, Urine >1.030 (*)    Hgb urine dipstick MODERATE (*)    Bilirubin Urine MODERATE (*)    Ketones, ur >80 (*)    Protein, ur >300 (*)    Nitrite POSITIVE (*)    All other components within normal limits  URINALYSIS, MICROSCOPIC (REFLEX) - Abnormal; Notable for the following:    Bacteria, UA FEW (*)    Squamous Epithelial / LPF 6-30 (*)    All other components within normal limits  COMPREHENSIVE METABOLIC PANEL - Abnormal; Notable for the following:    Sodium 133 (*)    Chloride 97 (*)    CO2 21 (*)    Total  Protein 8.4 (*)    AST 52 (*)    Alkaline Phosphatase 37 (*)    Total Bilirubin 1.5 (*)    All other components within normal limits  CBC WITH DIFFERENTIAL/PLATELET - Abnormal; Notable for the following:    Hemoglobin 10.0 (*)    HCT 31.8 (*)    MCH 25.3 (*)    RDW 18.8 (*)    All other components within normal limits  VITAMIN B12 - Abnormal; Notable for the following:    Vitamin B-12 1,035 (*)    All other components within normal limits  PREGNANCY, URINE  LIPASE, BLOOD  ETHANOL    EKG  EKG Interpretation None       Radiology Ct Abdomen Pelvis W Contrast  Result Date: 02/28/2017 CLINICAL DATA:  Increased lower abdominal pain and vomiting for the past week. EXAM: CT ABDOMEN AND PELVIS WITH CONTRAST TECHNIQUE: Multidetector CT imaging of the abdomen and pelvis was performed using the standard protocol following bolus administration of intravenous contrast. CONTRAST:  ISOVUE-300 IOPAMIDOL (ISOVUE-300) INJECTION 61% COMPARISON:  01/27/2017. FINDINGS: Lower chest: Clear lung bases. Hepatobiliary: Diffuse low density of the liver relative to the spleen. Focal fat deposition adjacent to the falciform ligament. Additional several small focal areas of decreased density throughout the liver. Unremarkable gallbladder. Pancreas: Unremarkable. No pancreatic ductal dilatation or surrounding inflammatory changes. Spleen: Normal in size without focal abnormality. Adrenals/Urinary Tract: Adrenal glands are unremarkable. Kidneys are normal, without renal calculi, focal lesion, or hydronephrosis. Bladder is unremarkable. Stomach/Bowel: Stomach is within normal limits. Appendix appears normal. No evidence of bowel wall thickening, distention, or inflammatory changes. Vascular/Lymphatic: No significant vascular findings are present. No enlarged abdominal or pelvic lymph nodes. Reproductive: Previously demonstrated multiple uterine masses. Unremarkable ovaries. Other: No abdominal wall hernia or  abnormality. No abdominopelvic ascites. Musculoskeletal: Mild lumbar lower thoracic spine degenerative changes. IMPRESSION: 1. Multiple small, somewhat linear areas of decreased density throughout the liver. This appearance can be seen with sclerosing cholangitis. 2. Diffuse hepatic steatosis. 3. Multiple uterine fibroids. Electronically Signed   By: Beckie Salts M.D.   On: 02/28/2017 20:13   Ct L-spine No Charge  Result Date: 02/28/2017 CLINICAL DATA:  Numbness and tingling in both legs. EXAM: CT LUMBAR SPINE WITHOUT CONTRAST TECHNIQUE: Multidetector CT imaging of the lumbar spine was performed without intravenous contrast administration. Multiplanar CT image reconstructions were also generated. COMPARISON:  Abdomen and pelvis CT obtained today. Previous abdomen and pelvis CT examinations dated 01/27/2017 and 06/04/2016. FINDINGS: Segmentation: 5 non-rib-bearing lumbar vertebrae. Alignment:  Normal. Vertebrae: No fractures or pars defects. Paraspinal and other soft tissues: Unremarkable. Disc levels: Mild anterior spur formation at the L3-4 level. No disc protrusions, canal stenosis or foraminal stenosis. IMPRESSION: Mild anterior spondylosis at the L3-4 level. Otherwise, normal examination. Electronically Signed   By: Beckie Salts M.D.   On: 02/28/2017 20:18    Procedures Procedures (including critical care time)  Medications Ordered in ED Medications  sodium chloride 0.9 % bolus 1,000 mL (0 mLs Intravenous Stopped 02/28/17 2109)  HYDROmorphone (DILAUDID) injection 1 mg (1 mg Intravenous Given 02/28/17 1927)  ondansetron (ZOFRAN) injection 4 mg (4 mg Intravenous Given 02/28/17 1928)  iopamidol (ISOVUE-300) 61 % injection 100 mL (100 mLs Intravenous Contrast Given 02/28/17 1931)  diphenhydrAMINE (BENADRYL) 50 MG/ML injection (25 mg  Given 02/28/17 1950)     Initial Impression / Assessment and Plan / ED Course  I have reviewed the triage vital signs and the nursing notes.  Pertinent labs & imaging  results that were available during my care of the patient were reviewed by me and considered in my medical decision making (see chart for details).  Patient presents with abdominal pain, vomiting and lower extremity weakness. Patient tachycardic and hypertensive on initial eval, likely due to pain and dehydration. Abdominal tenderness may be due to uterine fibroids, but given worsening pain and vomiting, will obtain abdominal CT and check CBC, CMP, Lipase, and EtOH. No signs of alcohol withdrawal today. Neurologic exam shows no deficits, good strength in bilateral lower extremities, no sensory deficits, normal coordination. But given 3 weeks of symptoms will check lumbar spine CT and B12 level. Not concerned for cauda equina. Low suspicion of Guillan-Barre since symptoms seem to be improving and  Will give fluids, zofran and pain management.  CT L-spine shows moderate anterior spondylosis at L3-L4. B-12 normal. Ordered outpatient MRI as patient does have some back pain. Again presentation does not suggest cauda equina or guillan-barre.  CT of abdomen shows uterine fibroids, and fatty infiltration of liver. Labs are reassuring, no leukocytosis, hemoglobin stable, lipase is normal, AST is slightly elevated. No evidence of an acute abdominal process requiring admission or surgery. Urine with proteinuria, dicussed with patient having this rechecked with PCP.   Patient reports she is feeling better after Zofran and pain management. Advised patient to abstain from alcohol. Will discharge home with PPI and Reglan. Recommend small frequent meals. GI follow up. Patient endorsed some depression, not suicidal. Recommend follow up with [facility] outpatient. Return precautions provided. Patient and mother express understanding and are in agreement with plan.  Patient discussed with Dr. Fayrene Fearing, who saw patient as well and agrees with plan.   Final Clinical Impressions(s) / ED Diagnoses   Final diagnoses:  Numbness    Abdominal pain, unspecified abdominal location  Neuropathy  Acute gastritis without hemorrhage, unspecified gastritis type    New Prescriptions Discharge Medication List as of 02/28/2017  9:08 PM    START taking these medications   Details  hydrOXYzine (ATARAX/VISTARIL) 25 MG tablet Take 1 tablet (25 mg total) by mouth every 6 (six) hours., Starting Fri 02/28/2017, Print    metoCLOPramide (REGLAN) 10 MG tablet Take 1 tablet (10 mg total) by mouth every 6 (six) hours., Starting Fri 02/28/2017, Print    omeprazole (PRILOSEC) 20 MG capsule Take 1 capsule (20 mg total) by mouth 2 (two) times daily., Starting Fri 02/28/2017, Print         Dartha Lodge, PA-C 03/02/17 1655    Rolland Porter, MD 03/11/17  2052  

## 2017-02-28 NOTE — Discharge Instructions (Signed)
No alcohol. Return tomorrow for MRI. [facility] for [facility] evaluation regarding depression. Reglan for nausea. Prilosec am and pm for gastritis

## 2017-02-28 NOTE — ED Triage Notes (Signed)
Pt reports increased lower abdominal pain and emesis x 1 week. Reports having this every month about time of menstrual period.

## 2017-02-28 NOTE — ED Notes (Signed)
Patient is A & O x4.  She understood AVS instructions.  

## 2017-02-28 NOTE — ED Notes (Signed)
CT tech informed RN that pt is complaining of itching that was present prior to CT and has increased since.  Primary RN notified.

## 2017-03-30 ENCOUNTER — Encounter (HOSPITAL_COMMUNITY): Payer: Self-pay

## 2017-03-30 ENCOUNTER — Emergency Department (HOSPITAL_COMMUNITY): Payer: No Typology Code available for payment source

## 2017-03-30 ENCOUNTER — Inpatient Hospital Stay (HOSPITAL_COMMUNITY)
Admission: EM | Admit: 2017-03-30 | Discharge: 2017-04-02 | DRG: 872 | Disposition: A | Discharge status: Home / Self Care | Payer: No Typology Code available for payment source | Attending: Internal Medicine | Admitting: Internal Medicine

## 2017-03-30 DIAGNOSIS — R112 Nausea with vomiting, unspecified: Secondary | ICD-10-CM | POA: Diagnosis present

## 2017-03-30 DIAGNOSIS — F418 Other specified anxiety disorders: Secondary | ICD-10-CM | POA: Diagnosis present

## 2017-03-30 DIAGNOSIS — A419 Sepsis, unspecified organism: Secondary | ICD-10-CM | POA: Diagnosis not present

## 2017-03-30 DIAGNOSIS — F32A Depression, unspecified: Secondary | ICD-10-CM | POA: Diagnosis present

## 2017-03-30 DIAGNOSIS — R109 Unspecified abdominal pain: Secondary | ICD-10-CM | POA: Diagnosis present

## 2017-03-30 DIAGNOSIS — K2920 Alcoholic gastritis without bleeding: Secondary | ICD-10-CM | POA: Diagnosis present

## 2017-03-30 DIAGNOSIS — K2970 Gastritis, unspecified, without bleeding: Secondary | ICD-10-CM | POA: Diagnosis present

## 2017-03-30 DIAGNOSIS — E872 Acidosis, unspecified: Secondary | ICD-10-CM | POA: Diagnosis present

## 2017-03-30 DIAGNOSIS — K920 Hematemesis: Secondary | ICD-10-CM | POA: Diagnosis present

## 2017-03-30 DIAGNOSIS — D509 Iron deficiency anemia, unspecified: Secondary | ICD-10-CM | POA: Diagnosis present

## 2017-03-30 DIAGNOSIS — N390 Urinary tract infection, site not specified: Secondary | ICD-10-CM | POA: Diagnosis present

## 2017-03-30 DIAGNOSIS — Z79899 Other long term (current) drug therapy: Secondary | ICD-10-CM

## 2017-03-30 DIAGNOSIS — R1030 Lower abdominal pain, unspecified: Secondary | ICD-10-CM

## 2017-03-30 DIAGNOSIS — R7989 Other specified abnormal findings of blood chemistry: Secondary | ICD-10-CM | POA: Diagnosis present

## 2017-03-30 DIAGNOSIS — F329 Major depressive disorder, single episode, unspecified: Secondary | ICD-10-CM | POA: Diagnosis present

## 2017-03-30 DIAGNOSIS — F10939 Alcohol use, unspecified with withdrawal, unspecified: Secondary | ICD-10-CM | POA: Diagnosis present

## 2017-03-30 DIAGNOSIS — E876 Hypokalemia: Secondary | ICD-10-CM | POA: Diagnosis not present

## 2017-03-30 DIAGNOSIS — K8309 Other cholangitis: Secondary | ICD-10-CM | POA: Diagnosis present

## 2017-03-30 DIAGNOSIS — R945 Abnormal results of liver function studies: Secondary | ICD-10-CM | POA: Diagnosis present

## 2017-03-30 DIAGNOSIS — F10231 Alcohol dependence with withdrawal delirium: Secondary | ICD-10-CM | POA: Diagnosis present

## 2017-03-30 DIAGNOSIS — F10239 Alcohol dependence with withdrawal, unspecified: Secondary | ICD-10-CM | POA: Diagnosis present

## 2017-03-30 DIAGNOSIS — J029 Acute pharyngitis, unspecified: Secondary | ICD-10-CM | POA: Diagnosis present

## 2017-03-30 DIAGNOSIS — K219 Gastro-esophageal reflux disease without esophagitis: Secondary | ICD-10-CM | POA: Diagnosis present

## 2017-03-30 DIAGNOSIS — Z87891 Personal history of nicotine dependence: Secondary | ICD-10-CM

## 2017-03-30 DIAGNOSIS — D62 Acute posthemorrhagic anemia: Secondary | ICD-10-CM | POA: Diagnosis present

## 2017-03-30 DIAGNOSIS — F10931 Alcohol use, unspecified with withdrawal delirium: Secondary | ICD-10-CM | POA: Diagnosis present

## 2017-03-30 HISTORY — DX: Major depressive disorder, single episode, unspecified: F32.9

## 2017-03-30 HISTORY — DX: Alcohol abuse, uncomplicated: F10.10

## 2017-03-30 HISTORY — DX: Depression, unspecified: F32.A

## 2017-03-30 HISTORY — DX: Iron deficiency anemia, unspecified: D50.9

## 2017-03-30 HISTORY — DX: Gastrointestinal hemorrhage, unspecified: K92.2

## 2017-03-30 LAB — COMPREHENSIVE METABOLIC PANEL
ALBUMIN: 4.3 g/dL (ref 3.5–5.0)
ALT: 61 U/L — ABNORMAL HIGH (ref 14–54)
ANION GAP: 24 — AB (ref 5–15)
AST: 99 U/L — ABNORMAL HIGH (ref 15–41)
Alkaline Phosphatase: 82 U/L (ref 38–126)
BILIRUBIN TOTAL: 0.8 mg/dL (ref 0.3–1.2)
BUN: 17 mg/dL (ref 6–20)
CALCIUM: 9.7 mg/dL (ref 8.9–10.3)
CO2: 16 mmol/L — ABNORMAL LOW (ref 22–32)
Chloride: 96 mmol/L — ABNORMAL LOW (ref 101–111)
Creatinine, Ser: 1.08 mg/dL — ABNORMAL HIGH (ref 0.44–1.00)
Glucose, Bld: 205 mg/dL — ABNORMAL HIGH (ref 65–99)
POTASSIUM: 4.3 mmol/L (ref 3.5–5.1)
Sodium: 136 mmol/L (ref 135–145)
TOTAL PROTEIN: 9.2 g/dL — AB (ref 6.5–8.1)

## 2017-03-30 LAB — LIPASE, BLOOD: LIPASE: 20 U/L (ref 11–51)

## 2017-03-30 LAB — URINALYSIS, ROUTINE W REFLEX MICROSCOPIC
BILIRUBIN URINE: NEGATIVE
Glucose, UA: 50 mg/dL — AB
HGB URINE DIPSTICK: NEGATIVE
Ketones, ur: 80 mg/dL — AB
Nitrite: NEGATIVE
PROTEIN: 100 mg/dL — AB
Specific Gravity, Urine: 1.027 (ref 1.005–1.030)
pH: 5 (ref 5.0–8.0)

## 2017-03-30 LAB — RAPID URINE DRUG SCREEN, HOSP PERFORMED
Amphetamines: NOT DETECTED
Barbiturates: NOT DETECTED
Benzodiazepines: NOT DETECTED
Cocaine: NOT DETECTED
OPIATES: NOT DETECTED
TETRAHYDROCANNABINOL: NOT DETECTED

## 2017-03-30 LAB — CBC WITH DIFFERENTIAL/PLATELET
BASOS ABS: 0.1 10*3/uL (ref 0.0–0.1)
Basophils Relative: 1 %
EOS ABS: 0 10*3/uL (ref 0.0–0.7)
EOS PCT: 0 %
HCT: 34.8 % — ABNORMAL LOW (ref 36.0–46.0)
Hemoglobin: 11.1 g/dL — ABNORMAL LOW (ref 12.0–15.0)
Lymphocytes Relative: 8 %
Lymphs Abs: 1.7 10*3/uL (ref 0.7–4.0)
MCH: 24.9 pg — ABNORMAL LOW (ref 26.0–34.0)
MCHC: 31.9 g/dL (ref 30.0–36.0)
MCV: 78.2 fL (ref 78.0–100.0)
Monocytes Absolute: 0.5 10*3/uL (ref 0.1–1.0)
Monocytes Relative: 2 %
Neutro Abs: 18.6 10*3/uL — ABNORMAL HIGH (ref 1.7–7.7)
Neutrophils Relative %: 89 %
Platelets: 405 10*3/uL — ABNORMAL HIGH (ref 150–400)
RBC: 4.45 MIL/uL (ref 3.87–5.11)
RDW: 18.6 % — AB (ref 11.5–15.5)
WBC: 21 10*3/uL — AB (ref 4.0–10.5)

## 2017-03-30 LAB — I-STAT CG4 LACTIC ACID, ED
LACTIC ACID, VENOUS: 7.3 mmol/L — AB (ref 0.5–1.9)
Lactic Acid, Venous: 2.32 mmol/L (ref 0.5–1.9)

## 2017-03-30 LAB — I-STAT BETA HCG BLOOD, ED (MC, WL, AP ONLY): I-stat hCG, quantitative: <5 m[IU]/mL (ref <5)

## 2017-03-30 LAB — MAGNESIUM: Magnesium: 1.6 mg/dL — ABNORMAL LOW (ref 1.7–2.4)

## 2017-03-30 MED ORDER — MAGNESIUM SULFATE IN D5W 1-5 GM/100ML-% IV SOLN
1.0000 g | Freq: Once | INTRAVENOUS | Status: AC
  Administered 2017-03-30: 1 g via INTRAVENOUS
  Filled 2017-03-30: qty 100

## 2017-03-30 MED ORDER — VANCOMYCIN HCL IN DEXTROSE 1-5 GM/200ML-% IV SOLN
1000.0000 mg | Freq: Once | INTRAVENOUS | Status: AC
  Administered 2017-03-31: 1000 mg via INTRAVENOUS
  Filled 2017-03-30: qty 200

## 2017-03-30 MED ORDER — VITAMIN B-1 100 MG PO TABS
100.0000 mg | ORAL_TABLET | Freq: Every day | ORAL | Status: DC
  Administered 2017-03-31 – 2017-04-02 (×3): 100 mg via ORAL
  Filled 2017-03-30 (×3): qty 1

## 2017-03-30 MED ORDER — THIAMINE HCL 100 MG/ML IJ SOLN
100.0000 mg | Freq: Every day | INTRAMUSCULAR | Status: DC
  Administered 2017-03-30: 100 mg via INTRAVENOUS
  Filled 2017-03-30: qty 2

## 2017-03-30 MED ORDER — FOLIC ACID 5 MG/ML IJ SOLN
1.0000 mg | Freq: Every day | INTRAMUSCULAR | Status: DC
  Administered 2017-03-30 – 2017-04-01 (×3): 1 mg via INTRAVENOUS
  Filled 2017-03-30 (×4): qty 0.2

## 2017-03-30 MED ORDER — SODIUM CHLORIDE 0.9 % IV BOLUS (SEPSIS)
1000.0000 mL | Freq: Once | INTRAVENOUS | Status: AC
  Administered 2017-03-30: 1000 mL via INTRAVENOUS

## 2017-03-30 MED ORDER — PIPERACILLIN-TAZOBACTAM 3.375 G IVPB 30 MIN
3.3750 g | Freq: Once | INTRAVENOUS | Status: AC
  Administered 2017-03-31: 3.375 g via INTRAVENOUS
  Filled 2017-03-30: qty 50

## 2017-03-30 MED ORDER — LORAZEPAM 1 MG PO TABS
0.0000 mg | ORAL_TABLET | Freq: Four times a day (QID) | ORAL | Status: AC
  Administered 2017-03-31 (×2): 1 mg via ORAL
  Filled 2017-03-30 (×3): qty 1

## 2017-03-30 MED ORDER — LORAZEPAM 2 MG/ML IJ SOLN: 0.0000 mg | Freq: Two times a day (BID) | INTRAMUSCULAR | Status: DC

## 2017-03-30 MED ORDER — SODIUM CHLORIDE 0.9 % IV BOLUS (SEPSIS)
1000.0000 mL | Freq: Once | INTRAVENOUS | Status: DC
  Administered 2017-03-30: 1000 mL via INTRAVENOUS

## 2017-03-30 MED ORDER — LORAZEPAM 2 MG/ML IJ SOLN
0.0000 mg | Freq: Four times a day (QID) | INTRAMUSCULAR | Status: AC
  Administered 2017-03-30: 4 mg via INTRAVENOUS
  Administered 2017-04-01: 1 mg via INTRAVENOUS
  Filled 2017-03-30: qty 1
  Filled 2017-03-30: qty 2

## 2017-03-30 MED ORDER — LORAZEPAM 1 MG PO TABS: 0.0000 mg | ORAL_TABLET | Freq: Two times a day (BID) | ORAL | Status: DC

## 2017-03-30 NOTE — BH Assessment (Signed)
[facility] Assessment Progress Note   TTS psych consult ordered. No EDP note in chart. Per triage note, pt presents to ED due to "States since last night nausea and vomiting now hyperventilation and hands drawing up light headed". TTS attempted to contact pt's nurse to discuss need for TTS consult. Placed on hold by nurse secretary and line was disconnected after several minutes of being on hold. TTS unable to determine need for psych consult at this time. Triage note does not identify any psych issues. TTS will follow up with nurse and EDP to discuss need for psych consult.   Princess Bruins, MSW, LCSW Therapeutic Triage Specialist  [phone]

## 2017-03-30 NOTE — Progress Notes (Signed)
A consult was received from an ED physician for zosyn and vancomycin per pharmacy dosing.  The patient's profile has been reviewed for ht/wt/allergies/indication/available labs.   A one time order has been placed for Zosyn 3.375 Gm and Vancomycin 1 Gm.  Further antibiotics/pharmacy consults should be ordered by admitting physician if indicated.                       Thank you, Lorenza Evangelist 03/30/2017  11:37 PM

## 2017-03-30 NOTE — ED Provider Notes (Signed)
[facility]-EMERGENCY DEPT Provider Note   CSN: [id number] Arrival date & time: 03/30/17  2051     History   Chief Complaint Chief Complaint  Patient presents with  . Delirium Tremens (DTS)    HPI Susan [name] is a 36 y.o. female who presents today for evaluation of alcohol withdrawal. She reports that she has had problems with alcohol in the past and has been drinking heavily for approximately the past month. She reports that when she has previously withdrawn she has required 6 days in the ICU. She denies any other drug use. She is here with her mother and her sister who she reports can be notified of all information.  He reports that her last drink was yesterday.    History of present illness is limited secondary to patient condition.  HPI  Past Medical History:  Diagnosis Date  . Gastritis   . Pancreatitis     Patient Active Problem List   Diagnosis Date Noted  . GI bleed 09/05/2016  . Hyperglycemia 06/04/2016    Past Surgical History:  Procedure Laterality Date  . ESOPHAGOGASTRODUODENOSCOPY (EGD) WITH PROPOFOL N/A 06/05/2016   Procedure: ESOPHAGOGASTRODUODENOSCOPY (EGD) WITH PROPOFOL;  Surgeon: Kathi DerParag Brahmbhatt, MD;  Location: [facility] ENDOSCOPY;  Service: Gastroenterology;  Laterality: N/A;    OB History    No data available       Home Medications    Prior to Admission medications   Medication Sig Start Date End Date Taking? Authorizing Provider  acetaminophen (TYLENOL) 325 MG tablet Take 1 tablet (325 mg total) by mouth every 6 (six) hours as needed for mild pain, fever or headache. Patient taking differently: Take 650 mg by mouth every 6 (six) hours as needed for mild pain, fever or headache.  09/09/16   Lonia BloodMcClung, Jeffrey T, MD  Cyanocobalamin (B-12 PO) Take 1 tablet by mouth daily.    [provider]  dicyclomine (BENTYL) 20 MG tablet Take 1 tablet (20 mg total) by mouth 2 (two) times daily. 01/27/17   Alvira MondaySchlossman, Erin, MD    esomeprazole (NEXIUM) 20 MG capsule Take 20 mg by mouth daily at 12 noon.    [provider]  ferrous sulfate 325 (65 FE) MG tablet Take 325 mg by mouth daily with breakfast.    [provider]  fexofenadine (ALLEGRA) 180 MG tablet Take 180 mg by mouth daily.    [provider]  hydrOXYzine (ATARAX/VISTARIL) 25 MG tablet Take 1 tablet (25 mg total) by mouth every 6 (six) hours. 02/28/17   Rolland PorterJames, Mark, MD  magnesium oxide (MAG-OX) 400 MG tablet TAKE 1 DAILY TABLET WITH IRON 01/24/17   [provider]  metoCLOPramide (REGLAN) 10 MG tablet Take 1 tablet (10 mg total) by mouth every 6 (six) hours. 02/28/17   Rolland PorterJames, Mark, MD  Multiple Vitamin (MULTIVITAMIN WITH MINERALS) TABS tablet Take 2 tablets by mouth daily.    [provider]  omeprazole (PRILOSEC) 20 MG capsule Take 1 capsule (20 mg total) by mouth 2 (two) times daily. 02/28/17   Rolland PorterJames, Mark, MD  ondansetron (ZOFRAN ODT) 4 MG disintegrating tablet Take 1 tablet (4 mg total) by mouth every 8 (eight) hours as needed for nausea or vomiting. 09/09/16   Lonia BloodMcClung, Jeffrey T, MD  ondansetron (ZOFRAN ODT) 4 MG disintegrating tablet Take 1 tablet (4 mg total) by mouth every 8 (eight) hours as needed for nausea or vomiting. 01/27/17   Alvira MondaySchlossman, Erin, MD  pantoprazole (PROTONIX) 40 MG tablet Take 1 tablet (40  mg total) by mouth daily. Patient not taking: Reported on 01/27/2017 09/09/16   Lonia Blood, MD  potassium chloride SA (K-DUR,KLOR-CON) 20 MEQ tablet Take 2 tablets (40 mEq total) by mouth daily. Patient not taking: Reported on 11/04/2016 09/09/16   Lonia Blood, MD  SPRINTEC 28 0.25-35 MG-MCG tablet Take 1 tablet by mouth daily. 01/24/17   [provider]    Family History History reviewed. No pertinent family history.  Social History Social History  Substance Use Topics  . Smoking status: Former Games developer  . Smokeless tobacco: Never Used  . Alcohol use Yes     Comment: occasional      Allergies   Latex   Review of Systems Review of Systems  Unable to perform ROS: Acuity of condition     Physical Exam Updated Vital Signs BP (!) 117/94   Pulse (!) 112   Temp 98.4 F (36.9 C) (Rectal)   Resp (!) 21   Ht 5\' 6"  (1.676 m)   Wt 60.3 kg (133 lb)   LMP 03/13/2017   SpO2 99%   BMI 21.47 kg/m   Physical Exam  Constitutional: She is oriented to person, place, and time. She appears well-developed and well-nourished.  HENT:  Head: Normocephalic and atraumatic.  Mouth/Throat: Oropharynx is clear and moist.  Eyes: Pupils are equal, round, and reactive to light. No scleral icterus.  Neck: Normal range of motion. Neck supple. No JVD present.  Cardiovascular: Regular rhythm, normal heart sounds and intact distal pulses.   Pulmonary/Chest: Effort normal and breath sounds normal. No stridor. No respiratory distress.  Abdominal: Soft. She exhibits no distension. There is no tenderness.  Patient is actively vomiting.   Musculoskeletal: Normal range of motion.  Lymphadenopathy:    She has no cervical adenopathy.  Neurological: She is alert and oriented to person, place, and time. She displays tremor. GCS eye subscore is 4. GCS verbal subscore is 5. GCS motor subscore is 6.  Skin: Skin is warm and dry.  Nursing note and vitals reviewed.    ED Treatments / Results  Labs (all labs ordered are listed, but only abnormal results are displayed) Labs Reviewed  CBC WITH DIFFERENTIAL/PLATELET - Abnormal; Notable for the following:       Result Value   WBC 21.0 (*)    Hemoglobin 11.1 (*)    HCT 34.8 (*)    MCH 24.9 (*)    RDW 18.6 (*)    Platelets 405 (*)    Neutro Abs 18.6 (*)    All other components within normal limits  COMPREHENSIVE METABOLIC PANEL - Abnormal; Notable for the following:    Chloride 96 (*)    CO2 16 (*)    Glucose, Bld 205 (*)    Creatinine, Ser 1.08 (*)    Total Protein 9.2 (*)    AST 99 (*)    ALT 61 (*)    Anion gap 24 (*)    All  other components within normal limits  MAGNESIUM - Abnormal; Notable for the following:    Magnesium 1.6 (*)    All other components within normal limits  I-STAT CG4 LACTIC ACID, ED - Abnormal; Notable for the following:    Lactic Acid, Venous 7.30 (*)    All other components within normal limits  LIPASE, BLOOD  RAPID URINE DRUG SCREEN, HOSP PERFORMED  ETHANOL  URINALYSIS, ROUTINE W REFLEX MICROSCOPIC  I-STAT BETA HCG BLOOD, ED ([facility], [facility], [facility] ONLY)    EKG  EKG Interpretation  Date/Time:  Sunday March 30 2017 21:34:16 EDT Ventricular Rate:  106 PR Interval:    QRS Duration: 99 QT Interval:  377 QTC Calculation: 501 R Axis:   97 Text Interpretation:  Atrial fibrillation Consider right ventricular hypertrophy Nonspecific T abnormalities, anterior leads Borderline prolonged QT interval Since last EKG, QT has prolonged Non-specific ST-t changes, no elevations or depressions Confirmed by Shaune Pollack [phone]) on 03/30/2017 11:43:13 PM Also confirmed by Shaune Pollack [phone]), editor Barbette Hair [phone])  on 03/31/2017 7:03:18 AM       Radiology No results found.  Procedures Procedures (including critical care time)  Medications Ordered in ED Medications  LORazepam (ATIVAN) injection 0-4 mg (4 mg Intravenous Given 03/30/17 2209)    Or  LORazepam (ATIVAN) tablet 0-4 mg ( Oral See Alternative 03/30/17 2209)  LORazepam (ATIVAN) injection 0-4 mg (not administered)    Or  LORazepam (ATIVAN) tablet 0-4 mg (not administered)  thiamine (VITAMIN B-1) tablet 100 mg ( Oral See Alternative 03/30/17 2210)    Or  thiamine (B-1) injection 100 mg (100 mg Intravenous Given 03/30/17 2210)  folic acid injection 1 mg (1 mg Intravenous Given 03/30/17 2209)  magnesium sulfate IVPB 1 g 100 mL (1 g Intravenous New Bag/Given 03/30/17 2224)  sodium chloride 0.9 % bolus 1,000 mL (not administered)  sodium chloride 0.9 % bolus 1,000 mL (1,000 mLs Intravenous New Bag/Given 03/30/17 2149)      Initial Impression / Assessment and Plan / ED Course  I have reviewed the triage vital signs and the nursing notes.  Pertinent labs & imaging results that were available during my care of the patient were reviewed by me and considered in my medical decision making (see chart for details).  Clinical Course as of Apr 02 2032  Wynelle Link Mar 30, 2017  2144 Patient noted to have elevated QTc interval. Based on history will give her 1 mg of magnesium suspecting that she has a low magnesium.    [EH]  2246 Patient meets code sepsis criteria with tachycardia, elevated lactic acid.  Asked RN to place patient on end tidal monitoring and put order in epic.   [EH]  2346 Sepsis reassessment completed.  Patient is resting comfortably.  Will awaken to voice and answer questions.   [EH]  2353 Called lab to follow up on ethanol.  They will run it now.   [EH]  2354 Spoke with RN regarding need for antibiotics, repeat lactic.   [EH]  Mon Mar 31, 2017  0004 Spoke with intensivist on call who feels like patient does not require ICU at this time and would be appropriate for stepdown.  [EH]  0027 Spoke with Dr. Clyde Lundborg who agreed to come see patient.    [EH]    Clinical Course User Index [EH] Cristina Gong, PA-C   Rico Sheehan presents today for evaluation of abdominal pain.  She has a long-standing history of alcohol abuse.  Clinically her presentation is consistent with severe alcohol withdrawal.  Initial CIWA score of 24.  She was treated with 4 mg of Ativan which resolved her symptoms.  She was also given thiamine, folate, and fluid in the emergency room.  Her QRS was wide, and based on history and that she has low magnesium, was given 1 mg magnesium in the ED.  Due to her tachycardia and elevated lactic acid, patient meets sepsis criteria.  A code sepsis was called, and patient was started on broad-spectrum antibiotics.  She was given a 30 mL/kg fluid  bolus which significantly reduced her lactic acidosis.   Other labs were obtained, UDS negative, magnesium low at 1.6, anemia with hemoglobin of 11.1, elevated ALT and AST.  Portable chest x-ray without obvious abnormalities.  As she needs code sepsis criteria and had a very high CIWA score, along with history of requiring ICU admissions for alcohol withdrawal critical care  medicine was consulted who feels like at this time the patient does not require ICU, and may be treated in a stepdown unit safely.  Hospitalist was consulted for admission and I spoke with Dr. Clyde Lundborg agreed to see the patient and admit her.  This patient was seen as a shared visit with Dr.Isaacs who evaluated the patient and agreed with my plan.    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

## 2017-03-30 NOTE — ED Triage Notes (Signed)
States since last night nausea and vomiting now hyperventilation and hands drawing up light headed

## 2017-03-30 NOTE — BH Assessment (Signed)
[facility] Assessment Progress Note   TTS consult request has been removed.  Princess Bruins, MSW, LCSW Therapeutic Triage Specialist  [phone]

## 2017-03-30 NOTE — BH Assessment (Signed)
[facility] Assessment Progress Note  TTS attempted to locate pt's nurse in ED to review need for consult but pt's nurse is not at desk and unavailable to speak with TTS at this time. No EDP note in the chart, it is unclear if the pt is medically cleared and if she has any presenting psych concerns. TTS to follow up.   Princess Bruins, MSW, LCSW Therapeutic Triage Specialist  [phone]

## 2017-03-31 ENCOUNTER — Inpatient Hospital Stay (HOSPITAL_COMMUNITY): Payer: No Typology Code available for payment source

## 2017-03-31 ENCOUNTER — Encounter (HOSPITAL_COMMUNITY): Payer: Self-pay | Admitting: Internal Medicine

## 2017-03-31 DIAGNOSIS — K8309 Other cholangitis: Secondary | ICD-10-CM | POA: Diagnosis present

## 2017-03-31 DIAGNOSIS — N390 Urinary tract infection, site not specified: Secondary | ICD-10-CM | POA: Diagnosis present

## 2017-03-31 DIAGNOSIS — F10230 Alcohol dependence with withdrawal, uncomplicated: Secondary | ICD-10-CM | POA: Diagnosis not present

## 2017-03-31 DIAGNOSIS — F329 Major depressive disorder, single episode, unspecified: Secondary | ICD-10-CM

## 2017-03-31 DIAGNOSIS — J029 Acute pharyngitis, unspecified: Secondary | ICD-10-CM | POA: Diagnosis present

## 2017-03-31 DIAGNOSIS — K219 Gastro-esophageal reflux disease without esophagitis: Secondary | ICD-10-CM | POA: Diagnosis present

## 2017-03-31 DIAGNOSIS — F10231 Alcohol dependence with withdrawal delirium: Secondary | ICD-10-CM

## 2017-03-31 DIAGNOSIS — R1012 Left upper quadrant pain: Secondary | ICD-10-CM

## 2017-03-31 DIAGNOSIS — E872 Acidosis, unspecified: Secondary | ICD-10-CM | POA: Diagnosis present

## 2017-03-31 DIAGNOSIS — K920 Hematemesis: Secondary | ICD-10-CM | POA: Diagnosis present

## 2017-03-31 DIAGNOSIS — R109 Unspecified abdominal pain: Secondary | ICD-10-CM | POA: Diagnosis present

## 2017-03-31 DIAGNOSIS — E876 Hypokalemia: Secondary | ICD-10-CM | POA: Diagnosis not present

## 2017-03-31 DIAGNOSIS — G43A0 Cyclical vomiting, not intractable: Secondary | ICD-10-CM | POA: Diagnosis not present

## 2017-03-31 DIAGNOSIS — D509 Iron deficiency anemia, unspecified: Secondary | ICD-10-CM

## 2017-03-31 DIAGNOSIS — R7989 Other specified abnormal findings of blood chemistry: Secondary | ICD-10-CM | POA: Diagnosis present

## 2017-03-31 DIAGNOSIS — F10939 Alcohol use, unspecified with withdrawal, unspecified: Secondary | ICD-10-CM | POA: Diagnosis present

## 2017-03-31 DIAGNOSIS — F10239 Alcohol dependence with withdrawal, unspecified: Secondary | ICD-10-CM | POA: Diagnosis not present

## 2017-03-31 DIAGNOSIS — A419 Sepsis, unspecified organism: Principal | ICD-10-CM

## 2017-03-31 DIAGNOSIS — Z79899 Other long term (current) drug therapy: Secondary | ICD-10-CM | POA: Diagnosis not present

## 2017-03-31 DIAGNOSIS — K2970 Gastritis, unspecified, without bleeding: Secondary | ICD-10-CM

## 2017-03-31 DIAGNOSIS — Z87891 Personal history of nicotine dependence: Secondary | ICD-10-CM | POA: Diagnosis not present

## 2017-03-31 DIAGNOSIS — R945 Abnormal results of liver function studies: Secondary | ICD-10-CM

## 2017-03-31 DIAGNOSIS — K2920 Alcoholic gastritis without bleeding: Secondary | ICD-10-CM | POA: Diagnosis present

## 2017-03-31 DIAGNOSIS — F418 Other specified anxiety disorders: Secondary | ICD-10-CM | POA: Diagnosis present

## 2017-03-31 DIAGNOSIS — D62 Acute posthemorrhagic anemia: Secondary | ICD-10-CM | POA: Diagnosis present

## 2017-03-31 LAB — RAPID STREP SCREEN (MED CTR MEBANE ONLY): Streptococcus, Group A Screen (Direct): NEGATIVE

## 2017-03-31 LAB — HCG, QUANTITATIVE, PREGNANCY

## 2017-03-31 LAB — LACTIC ACID, PLASMA
LACTIC ACID, VENOUS: 1 mmol/L (ref 0.5–1.9)
LACTIC ACID, VENOUS: 0.6 mmol/L (ref 0.5–1.9)

## 2017-03-31 LAB — PROCALCITONIN: Procalcitonin: <0.1 ng/mL

## 2017-03-31 LAB — PROTIME-INR
INR: 1.12
PROTHROMBIN TIME: 14.3 s (ref 11.4–15.2)

## 2017-03-31 LAB — SALICYLATE LEVEL

## 2017-03-31 LAB — GLUCOSE, CAPILLARY: GLUCOSE-CAPILLARY: 77 mg/dL (ref 65–99)

## 2017-03-31 LAB — INFLUENZA PANEL BY PCR (TYPE A & B)
INFLBPCR: NEGATIVE
Influenza A By PCR: NEGATIVE

## 2017-03-31 LAB — ACETAMINOPHEN LEVEL: Acetaminophen (Tylenol), Serum: <10 ug/mL — ABNORMAL LOW (ref 10–30)

## 2017-03-31 LAB — MRSA PCR SCREENING: MRSA by PCR: NEGATIVE

## 2017-03-31 LAB — HIV ANTIBODY (ROUTINE TESTING W REFLEX): HIV SCREEN 4TH GENERATION: NONREACTIVE

## 2017-03-31 LAB — ETHANOL: Alcohol, Ethyl (B): <10 mg/dL (ref <10)

## 2017-03-31 MED ORDER — SODIUM BICARBONATE 650 MG PO TABS
650.0000 mg | ORAL_TABLET | Freq: Two times a day (BID) | ORAL | Status: DC
  Administered 2017-03-31 – 2017-04-01 (×3): 650 mg via ORAL
  Filled 2017-03-31 (×3): qty 1

## 2017-03-31 MED ORDER — IBUPROFEN 200 MG PO TABS
200.0000 mg | ORAL_TABLET | Freq: Four times a day (QID) | ORAL | Status: DC | PRN
Start: 2017-03-31 — End: 2017-03-31

## 2017-03-31 MED ORDER — ZOLPIDEM TARTRATE 5 MG PO TABS: 5.0000 mg | ORAL_TABLET | Freq: Every evening | ORAL | Status: DC | PRN

## 2017-03-31 MED ORDER — B-12 100 MCG PO TABS: 1.0000 | ORAL_TABLET | Freq: Every day | ORAL | Status: DC

## 2017-03-31 MED ORDER — PIPERACILLIN-TAZOBACTAM 3.375 G IVPB
3.3750 g | Freq: Three times a day (TID) | INTRAVENOUS | Status: DC
  Administered 2017-03-31: 3.375 g via INTRAVENOUS
  Filled 2017-03-31: qty 50

## 2017-03-31 MED ORDER — CHLORDIAZEPOXIDE HCL 5 MG PO CAPS: 10.0000 mg | ORAL_CAPSULE | Freq: Once | ORAL | Status: DC

## 2017-03-31 MED ORDER — MORPHINE SULFATE (PF) 4 MG/ML IV SOLN: 2.0000 mg | INTRAVENOUS | Status: DC | PRN

## 2017-03-31 MED ORDER — PANTOPRAZOLE SODIUM 40 MG IV SOLR
40.0000 mg | Freq: Two times a day (BID) | INTRAVENOUS | Status: DC
  Administered 2017-03-31 – 2017-04-01 (×5): 40 mg via INTRAVENOUS
  Filled 2017-03-31 (×5): qty 40

## 2017-03-31 MED ORDER — SODIUM CHLORIDE 0.9 % IV BOLUS (SEPSIS)
500.0000 mL | Freq: Once | INTRAVENOUS | Status: AC
  Administered 2017-03-31: 500 mL via INTRAVENOUS

## 2017-03-31 MED ORDER — ENOXAPARIN SODIUM 40 MG/0.4ML ~~LOC~~ SOLN
40.0000 mg | SUBCUTANEOUS | Status: DC
  Administered 2017-03-31 – 2017-04-01 (×2): 40 mg via SUBCUTANEOUS
  Filled 2017-03-31 (×3): qty 0.4

## 2017-03-31 MED ORDER — LORATADINE 10 MG PO TABS
10.0000 mg | ORAL_TABLET | Freq: Every day | ORAL | Status: DC
  Administered 2017-03-31 – 2017-04-02 (×3): 10 mg via ORAL
  Filled 2017-03-31 (×3): qty 1

## 2017-03-31 MED ORDER — FLUOXETINE HCL 10 MG PO CAPS
10.0000 mg | ORAL_CAPSULE | Freq: Every day | ORAL | Status: DC
  Administered 2017-03-31 – 2017-04-01 (×3): 10 mg via ORAL
  Filled 2017-03-31 (×3): qty 1

## 2017-03-31 MED ORDER — VITAMIN B-12 100 MCG PO TABS
100.0000 ug | ORAL_TABLET | Freq: Every day | ORAL | Status: DC
  Administered 2017-03-31 – 2017-04-02 (×3): 100 ug via ORAL
  Filled 2017-03-31 (×4): qty 1

## 2017-03-31 MED ORDER — ACETAMINOPHEN 500 MG PO TABS
500.0000 mg | ORAL_TABLET | Freq: Three times a day (TID) | ORAL | Status: DC | PRN
  Administered 2017-03-31 – 2017-04-01 (×2): 500 mg via ORAL
  Filled 2017-03-31 (×2): qty 1

## 2017-03-31 MED ORDER — FERROUS SULFATE 325 (65 FE) MG PO TABS
325.0000 mg | ORAL_TABLET | Freq: Every day | ORAL | Status: DC
  Administered 2017-03-31 – 2017-04-02 (×3): 325 mg via ORAL
  Filled 2017-03-31 (×3): qty 1

## 2017-03-31 MED ORDER — CHLORDIAZEPOXIDE HCL 5 MG PO CAPS
5.0000 mg | ORAL_CAPSULE | Freq: Three times a day (TID) | ORAL | Status: DC
  Administered 2017-03-31 – 2017-04-01 (×6): 5 mg via ORAL
  Filled 2017-03-31 (×6): qty 1

## 2017-03-31 MED ORDER — MAGNESIUM SULFATE 2 GM/50ML IV SOLN
2.0000 g | Freq: Once | INTRAVENOUS | Status: AC
  Administered 2017-03-31: 2 g via INTRAVENOUS
  Filled 2017-03-31: qty 50

## 2017-03-31 MED ORDER — DICYCLOMINE HCL 20 MG PO TABS
20.0000 mg | ORAL_TABLET | Freq: Two times a day (BID) | ORAL | Status: DC
  Administered 2017-03-31 – 2017-04-02 (×6): 20 mg via ORAL
  Filled 2017-03-31 (×7): qty 1

## 2017-03-31 MED ORDER — DEXTROSE 5 % IV SOLN
1.0000 g | INTRAVENOUS | Status: DC
  Administered 2017-03-31 – 2017-04-01 (×2): 1 g via INTRAVENOUS
  Filled 2017-03-31 (×2): qty 10

## 2017-03-31 MED ORDER — URSODIOL 300 MG PO CAPS
300.0000 mg | ORAL_CAPSULE | Freq: Every day | ORAL | Status: DC
  Administered 2017-03-31 – 2017-04-02 (×3): 300 mg via ORAL
  Filled 2017-03-31 (×3): qty 1

## 2017-03-31 MED ORDER — ADULT MULTIVITAMIN W/MINERALS CH
2.0000 | ORAL_TABLET | Freq: Every day | ORAL | Status: DC
  Administered 2017-03-31 – 2017-04-02 (×3): 2 via ORAL
  Filled 2017-03-31 (×3): qty 2

## 2017-03-31 MED ORDER — VANCOMYCIN HCL IN DEXTROSE 750-5 MG/150ML-% IV SOLN
750.0000 mg | Freq: Two times a day (BID) | INTRAVENOUS | Status: DC
  Filled 2017-03-31: qty 150

## 2017-03-31 MED ORDER — SODIUM CHLORIDE 0.9 % IV SOLN
INTRAVENOUS | Status: DC
  Administered 2017-03-31: 150 mL/h via INTRAVENOUS
  Administered 2017-03-31: 150 mL via INTRAVENOUS
  Administered 2017-03-31: 150 mL/h via INTRAVENOUS
  Administered 2017-04-01 (×2): via INTRAVENOUS

## 2017-03-31 MED ORDER — MAGNESIUM OXIDE 400 (241.3 MG) MG PO TABS
400.0000 mg | ORAL_TABLET | Freq: Every day | ORAL | Status: DC
  Administered 2017-03-31 – 2017-04-02 (×3): 400 mg via ORAL
  Filled 2017-03-31 (×3): qty 1

## 2017-03-31 MED ORDER — HYDROXYZINE HCL 50 MG/ML IM SOLN
25.0000 mg | Freq: Four times a day (QID) | INTRAMUSCULAR | Status: DC | PRN
  Filled 2017-03-31 (×2): qty 0.5

## 2017-03-31 NOTE — Clinical Social Work Note (Signed)
Clinical Social Work Assessment  Patient Details  Name: Susan [name] MRN: [id number] Date of Birth: 08-17-1980  Date of referral:  03/31/17               Reason for consult:  Substance Use/ETOH Abuse                Permission sought to share information with:    Permission granted to share information::     Name::        Agency::     Relationship::     Contact Information:     Housing/Transportation Living arrangements for the past 2 months:  Single Family Home Source of Information:  Patient Patient Interpreter Needed:  None Criminal Activity/Legal Involvement Pertinent to Current Situation/Hospitalization:  No - Comment as needed Significant Relationships:  Parents, Siblings Lives with:  Self Do you feel safe going back to the place where you live?  Yes Need for family participation in patient care:  No, patient reports her mother and sister have been supportive of her during this time.   Care giving concerns: Alcohol Use  Alcohol Withdrawal, Nausea, Vomiting and Abdominal Pain and sore throat.    Social Worker assessment / plan:  CSW met with the patient at bedside, explain role and reason for visit-provide SA resources for alcohol use and provide assistance with finding program.  The patient reports she is currently going through a divorce with her spouse. She reports the divorce is causing anxiety, stress and in the last month her drinking has increase. 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 The patient is open to learn about SA treatment options.   She reports she was admitted to the hospital (ICU) for six days back in April,and at the time told to stop drinking because it is affecting her pancreas. The patient reports being sober for sometime then relapsed. She reports on a "bad day" she has at lease 8 shots, on a regular day she has 2 glasses of wine.  The patient has two outpatient therapist/cousnelor for her substance use,  anxiety depression.She is prescribed Prozac 20 mg. She reports she has not been able to take the medication in three days because of her nausea.   Plan: CSW provided Outpatient/ Residential Resources for SA treatment. Patient appreciative of CSW services.  She will inform CSW if assistance is needed.    Employment status:  Retired Forensic scientist:  Self Pay (Medicaid Pending) PT Recommendations:  Not assessed at this time Information / Referral to community resources:  Residential Substance Abuse Treatment Options, Outpatient Substance Abuse Treatment Options  Patient/Family's Response to care:  No family at bedside, patient reports her mother and sister brought her to the hospital for treatment. Patient receptive to resources.   Patient/Family's Understanding of and Emotional Response to Diagnosis, Current Treatment, and Prognosis: " My depression is causing me to drink more, and I know my drinking is effecting my insides."  Emotional Assessment Appearance:  Appears stated age Attitude/Demeanor/Rapport:    Affect (typically observed):  Accepting, Calm, Pleasant Orientation:  Oriented to Self, Oriented to Place, Oriented to  Time, Oriented to Situation Alcohol / Substance use:  Alcohol Use Psych involvement (Current and /or in the community):  No (Patient reports she sees a therapist at [facility] and a Biomedical engineer ) She reports she is prescribed Prozac 20 mg for her anxiety.   Discharge Needs  Concerns to be addressed:  Discharge Planning Concerns, Care Coordination Readmission within the last 30 days:  Yes Current discharge risk:  Substance Abuse Barriers to Discharge:  Continued Medical Work up   [employer], LCSW 03/31/2017, 3:05 PM

## 2017-03-31 NOTE — ED Provider Notes (Signed)
Medical screening examination/treatment/procedure(s) were conducted as a shared visit with non-physician practitioner(s) and myself.  I personally evaluated the patient during the encounter. Briefly, the patient is a 36 yo F with h/o EtOH dependence, h/o DTs here with tachycardia, hypertension, tachypnea, tremors in setting of stopping EtOH. Concern for acute mod to severe EtOH w/d, with concern for concomitant profound dehydration/malnutrition. No altered sensorium at this time and likely too early for DTs, but will need StepDown for close monitoring and tx. Ativan, fluids given in ED. She is improved after these interventions but remains tachycardic, anxious on exam.  .Critical Care Performed by: Duffy Bruce Authorized by: Duffy Bruce   Critical care provider statement:    Critical care time (minutes):  35   Critical care time was exclusive of:  Separately billable procedures and treating other patients and teaching time   Critical care was necessary to treat or prevent imminent or life-threatening deterioration of the following conditions:  Metabolic crisis, circulatory failure and CNS failure or compromise   Critical care was time spent personally by me on the following activities:  Development of treatment plan with patient or surrogate, discussions with consultants, evaluation of patient's response to treatment, examination of patient, obtaining history from patient or surrogate, ordering and performing treatments and interventions, ordering and review of laboratory studies, ordering and review of radiographic studies, pulse oximetry, re-evaluation of patient's condition and review of old charts   I assumed direction of critical care for this patient from another provider in my specialty: no        EKG Interpretation  Date/Time:  Sunday March 30 2017 21:34:16 EDT Ventricular Rate:  106 PR Interval:    QRS Duration: 99 QT Interval:  377 QTC Calculation: 501 R Axis:   97 Text  Interpretation:  Atrial fibrillation Consider right ventricular hypertrophy Nonspecific T abnormalities, anterior leads Borderline prolonged QT interval Since last EKG, QT has prolonged Non-specific ST-t changes, no elevations or depressions Confirmed by Duffy Bruce [phone]) on 03/30/2017 11:43:13 PM Also confirmed by Duffy Bruce [phone]), editor Philomena Doheny [phone])  on 03/31/2017 7:03:18 AM          Duffy Bruce, MD 03/31/17 1117

## 2017-03-31 NOTE — Progress Notes (Signed)
PROGRESS NOTE  Susan [name] NGE:[id number] DOB: 05-12-1981 DOA: 03/30/2017 PCP: Artelia Laroche, CNM  HPI/Recap of past 24 hours:  Feeling better, report hematemasis, less ab pain, report able to tolerate ice chips and sips of water No fever No agitation, no confusion, no tremor  Assessment/Plan: Principal Problem:   Alcohol withdrawal ([place]) Active Problems:   Nausea & vomiting   Sepsis ([place])   DTs (delirium tremens) ([place])   Hypomagnesemia   Abnormal LFTs   Metabolic acidosis   Gastritis   Abdominal pain   Depression   Iron deficiency anemia   Alcohol withdrawal Initially admitted to stepdown due to severity of symptom. She looks much better this am Transfer to med tele, continue civa protocol  Sepsis? Culture pending D/c vanc/zosyn, change to rocephin for possible uti  Hypomag: replace mag  Elevated lft, alcohol ?  Will check hepatitis panel Ct ab? sclerosing cholangitis , will need to refer to gi  H/o GERD/gastritis: on ppi    Code Status: full  Family Communication: patient   Disposition Plan: transfer to med tele   Consultants:  Critical care  Procedures:  none  Antibiotics:  Vanc/zosyn initially  Rocephin from 10/22   Objective: BP (!) 144/93   Pulse (!) 115   Temp 98.3 F (36.8 C) (Oral)   Resp 19   Ht 5' 2"  (1.575 m)   Wt 64.3 kg (141 lb 12.1 oz)   LMP 03/13/2017   SpO2 100%   BMI 25.93 kg/m   Intake/Output Summary (Last 24 hours) at 03/31/17 0804 Last data filed at 03/31/17 0700  Gross per 24 hour  Intake             4525 ml  Output              280 ml  Net             4245 ml   Filed Weights   03/30/17 2106 03/31/17 0235  Weight: 60.3 kg (133 lb) 64.3 kg (141 lb 12.1 oz)    Exam: Patient is examined daily including today on 03/31/2017, exams remain the same as of yesterday except that has changed    General:  NAD  Cardiovascular: slight sinus tachycardia  Respiratory: CTABL  Abdomen: Soft/ND/NT,  positive BS  Musculoskeletal: No Edema  Neuro: alert, oriented   Data Reviewed: Basic Metabolic Panel:  Recent Labs Lab 03/30/17 2142  NA 136  K 4.3  CL 96*  CO2 16*  GLUCOSE 205*  BUN 17  CREATININE 1.08*  CALCIUM 9.7  MG 1.6*   Liver Function Tests:  Recent Labs Lab 03/30/17 2142  AST 99*  ALT 61*  ALKPHOS 82  BILITOT 0.8  PROT 9.2*  ALBUMIN 4.3    Recent Labs Lab 03/30/17 2142  LIPASE 20   No results for input(s): AMMONIA in the last 168 hours. CBC:  Recent Labs Lab 03/30/17 2142  WBC 21.0*  NEUTROABS 18.6*  HGB 11.1*  HCT 34.8*  MCV 78.2  PLT 405*   Cardiac Enzymes:   No results for input(s): CKTOTAL, CKMB, CKMBINDEX, TROPONINI in the last 168 hours. BNP (last 3 results) No results for input(s): BNP in the last 8760 hours.  ProBNP (last 3 results) No results for input(s): PROBNP in the last 8760 hours.  CBG:  Recent Labs Lab 03/31/17 0733  GLUCAP 77    Recent Results (from the past 240 hour(s))  Rapid strep screen (not at [facility])     Status: None  Collection Time: 03/31/17 12:58 AM  Result Value Ref Range Status   Streptococcus, Group A Screen (Direct) NEGATIVE NEGATIVE Final    Comment: (NOTE) A Rapid Antigen test may result negative if the antigen level in the sample is below the detection level of this test. The FDA has not cleared this test as a stand-alone test therefore the rapid antigen negative result has reflexed to a Group A Strep culture.   MRSA PCR Screening     Status: None   Collection Time: 03/31/17  2:03 AM  Result Value Ref Range Status   MRSA by PCR NEGATIVE NEGATIVE Final    Comment:        The GeneXpert MRSA Assay (FDA approved for NASAL specimens only), is one component of a comprehensive MRSA colonization surveillance program. It is not intended to diagnose MRSA infection nor to guide or monitor treatment for MRSA infections.      Studies: US Pelvis (transabdominal Only)  Result Date:  03/31/2017 CLINICAL DATA:  Initial evaluation for acute left lower quadrant pain. History of ovarian cyst. EXAM: TRANSABDOMINAL ULTRASOUND OF PELVIS TECHNIQUE: Transabdominal ultrasound examination of the pelvis was performed including evaluation of the uterus, ovaries, adnexal regions, and pelvic cul-de-sac. COMPARISON:  Prior CT from 02/28/2017. FINDINGS: Uterus Measurements: 7.6 x 3.8 x 5.0 cm. Lobulated uterine contour, consistent with known fibroids. Largest of these measures approximately 2.3 cm 2.2 cm. Remaining fibroids not well delineated on this transabdominal scan. Endometrium Thickness: 7.2 mm.  No focal abnormality visualized. Right ovary Measurements: 4.2 x 2.3 x 2.1 cm. Normal appearance/no adnexal mass. Left ovary Measurements: 2.9 x 1.9 x 2.4 cm. Normal appearance/no adnexal mass. Other findings:  No abnormal free fluid. IMPRESSION: 1. Fibroid uterus as above. 2. Normal sonographic appearance of the ovaries. No adnexal mass or other acute abnormality. Electronically Signed   By: Jeannine Boga M.D.   On: 03/31/2017 02:13   Dg Chest Port 1 View  Result Date: 03/30/2017 CLINICAL DATA:  36 y/o F; nausea and vomiting with hyperventilation. EXAM: PORTABLE CHEST 1 VIEW COMPARISON:  09/04/2016 chest radiograph FINDINGS: Stable heart size and mediastinal contours are within normal limits. Both lungs are clear. The visualized skeletal structures are unremarkable. IMPRESSION: No active disease. Electronically Signed   By: Kristine Garbe M.D.   On: 03/30/2017 22:41    Scheduled Meds: . chlordiazePOXIDE  5 mg Oral Q8H  . dicyclomine  20 mg Oral BID  . enoxaparin (LOVENOX) injection  40 mg Subcutaneous Q24H  . ferrous sulfate  325 mg Oral Q breakfast  . FLUoxetine  10 mg Oral QHS  . folic acid  1 mg Intravenous Daily  . loratadine  10 mg Oral Daily  . LORazepam  0-4 mg Intravenous Q6H   Or  . LORazepam  0-4 mg Oral Q6H  . [START ON 04/02/2017] LORazepam  0-4 mg Intravenous  Q12H   Or  . [START ON 04/02/2017] LORazepam  0-4 mg Oral Q12H  . magnesium oxide  400 mg Oral Daily  . multivitamin with minerals  2 tablet Oral Daily  . pantoprazole (PROTONIX) IV  40 mg Intravenous Q12H  . thiamine  100 mg Oral Daily   Or  . thiamine  100 mg Intravenous Daily  . vitamin B-12  100 mcg Oral Daily    Continuous Infusions: . sodium chloride 150 mL (03/31/17 0559)  . cefTRIAXone (ROCEPHIN)  IV    . magnesium sulfate 1 - 4 g bolus IVPB       Time  spent: 35 mins from 10am to 10:35am I have personally reviewed and interpreted on  03/31/2017 daily labs, tele strips, imagings as discussed above under date review session and assessment and plans.  I reviewed all nursing notes, pharmacy notes,  vitals, pertinent old records  I have discussed plan of care as described above with RN , patient  on 03/31/2017   Annett Boxwell MD, PhD  [facility] Pager [phone]. If 7PM-7AM, please contact night-coverage at [email or web address], password [facility] 03/31/2017, 8:04 AM  LOS: 0 days

## 2017-03-31 NOTE — H&P (Signed)
History and Physical    Susan [name] TSV:[id number] DOB: 07/04/1980 DOA: 03/30/2017  Referring MD/NP/PA:   PCP: Artelia Laroche, CNM   Patient coming from:  The patient is coming from home.  At baseline, pt is independent for most of ADL.  Chief Complaint: alcohol withdrawal, nausea, vomiting, abdominal pain, sore throat  HPI: Susan [name] is a 36 y.o. female with medical history significant of alcohol abuse, alcoholic gastritis, GIB, iron deficiency anemia, pancreatitis, GERD, depression, who presents with alcohol withdrawal, nausea, vomiting, abdominal pain, sore throat.  Per sister, pt has had problems with alcohol in the past and has been drinking heavily for approximately the past month. Pt last drink was yesterday. Pt has nausea, vomiting since this morning. She vomited many times, no hematemesis or hematochezia. She had 1 loose stool bowel movement hernia, but no diarrhea currently. Patient states that she has history of ovarian cyst before, now has left lower quadrant abdominal pain which she thinks it may be due to cysts. The pain is constant, 5 out of 10 in severity, aching, nonradiating. She also reports sore throat, no runny nose, cough, chest pain or shortness of breath. Denies symptoms of UTI or unilateral weakness. Patient is tremulous at arrival to ED. She denies any other drug use. sister reports that when pt had previously withdrawn she had required 6 days in the ICU.   ED Course: pt was found to have WBC 21, lactic acid is 7.30, 2.32, alcohol<10, abnormal liver function with ALP 82, AST 99, ALT 61, total bilirubin 0.8, negative UDS, lipase 20, urinalysis with small amount of leukocyte and rare bacteria, electrolytes renal function okay, temperature normal, tachycardia, tachypnea, oxygen saturation 99% on room air, negative chest x-ray. Patient is admitted to stepdown as inpatient. ED physician discussed with PCCM-->recommended stepdown on admission.  Review of Systems:     General: no fevers, chills, no body weight gain, has poor appetite, has fatigue HEENT: no blurry vision, hearing changes. Has sore throat Respiratory: no dyspnea, coughing, wheezing CV: no chest pain, no palpitations GI: has nausea, vomiting, abdominal pain, no diarrhea, constipation GU: no dysuria, burning on urination, increased urinary frequency, hematuria  Ext: no leg edema Neuro: no unilateral weakness, numbness, or tingling, no vision change or hearing loss. Pt is tremulous. Skin: no rash, no skin tear. MSK: No muscle spasm, no deformity, no limitation of range of movement in spin Heme: No easy bruising.  Travel history: No recent long distant travel.  Allergy:  Allergies  Allergen Reactions  . Latex Rash    Tape irritates the skin    Past Medical History:  Diagnosis Date  . Alcohol abuse   . Depression   . Gastritis   . GIB (gastrointestinal bleeding)   . Iron deficiency anemia   . Pancreatitis     Past Surgical History:  Procedure Laterality Date  . ESOPHAGOGASTRODUODENOSCOPY (EGD) WITH PROPOFOL N/A 06/05/2016   Procedure: ESOPHAGOGASTRODUODENOSCOPY (EGD) WITH PROPOFOL;  Surgeon: Otis Brace, MD;  Location: [facility];  Service: Gastroenterology;  Laterality: N/A;    Social History:  reports that she has quit smoking. She has never used smokeless tobacco. She reports that she drinks alcohol. She reports that she does not use drugs.  Family History:  Family History  Problem Relation Age of Onset  . Stroke Mother      Prior to Admission medications   Medication Sig Start Date End Date Taking? Authorizing Provider  acetaminophen (TYLENOL) 325 MG tablet Take 1 tablet (325 mg total)  by mouth every 6 (six) hours as needed for mild pain, fever or headache. Patient taking differently: Take 650 mg by mouth every 6 (six) hours as needed for mild pain, fever or headache.  09/09/16   Cherene Altes, MD  Cyanocobalamin (B-12 PO) Take 1 tablet by mouth daily.     [provider]  dicyclomine (BENTYL) 20 MG tablet Take 1 tablet (20 mg total) by mouth 2 (two) times daily. 01/27/17   Gareth Morgan, MD  esomeprazole (NEXIUM) 20 MG capsule Take 20 mg by mouth daily at 12 noon.    [provider]  ferrous sulfate 325 (65 FE) MG tablet Take 325 mg by mouth daily with breakfast.    [provider]  fexofenadine (ALLEGRA) 180 MG tablet Take 180 mg by mouth daily.    [provider]  hydrOXYzine (ATARAX/VISTARIL) 25 MG tablet Take 1 tablet (25 mg total) by mouth every 6 (six) hours. 02/28/17   Tanna Furry, MD  magnesium oxide (MAG-OX) 400 MG tablet TAKE 1 DAILY TABLET WITH IRON 01/24/17   [provider]  metoCLOPramide (REGLAN) 10 MG tablet Take 1 tablet (10 mg total) by mouth every 6 (six) hours. 02/28/17   Tanna Furry, MD  Multiple Vitamin (MULTIVITAMIN WITH MINERALS) TABS tablet Take 2 tablets by mouth daily.    [provider]  omeprazole (PRILOSEC) 20 MG capsule Take 1 capsule (20 mg total) by mouth 2 (two) times daily. 02/28/17   Tanna Furry, MD  ondansetron (ZOFRAN ODT) 4 MG disintegrating tablet Take 1 tablet (4 mg total) by mouth every 8 (eight) hours as needed for nausea or vomiting. 09/09/16   Cherene Altes, MD  ondansetron (ZOFRAN ODT) 4 MG disintegrating tablet Take 1 tablet (4 mg total) by mouth every 8 (eight) hours as needed for nausea or vomiting. 01/27/17   Gareth Morgan, MD  pantoprazole (PROTONIX) 40 MG tablet Take 1 tablet (40 mg total) by mouth daily. Patient not taking: Reported on 01/27/2017 09/09/16   Cherene Altes, MD  potassium chloride SA (K-DUR,KLOR-CON) 20 MEQ tablet Take 2 tablets (40 mEq total) by mouth daily. Patient not taking: Reported on 11/04/2016 09/09/16   Cherene Altes, MD  SPRINTEC 28 0.25-35 MG-MCG tablet Take 1 tablet by mouth daily. 01/24/17   [provider]    Physical Exam: Vitals:   03/31/17 0030 03/31/17 0100 03/31/17 0119 03/31/17 0130  BP:  127/82 134/90 134/90 (!) 138/95  Pulse: (!) 114 (!) 113 (!) 113 (!) 114  Resp: (!) 21 20  (!) 21  Temp:      TempSrc:      SpO2: 99% 99%  100%  Weight:      Height:       General: Not in acute distress. Patient is tremulous. HEENT:       Eyes: PERRL, EOMI, no scleral icterus.       ENT: No discharge from the ears and nose, no pharynx injection, no tonsillar enlargement.        Neck: No JVD, no bruit, no mass felt. Heme: No neck lymph node enlargement. Cardiac: S1/S2, RRR, No murmurs, No gallops or rubs. Respiratory: No rales, wheezing, rhonchi or rubs. GI: Soft, nondistended, has tenderness in LLQ, no rebound pain, no organomegaly, BS present. GU: No hematuria Ext: No pitting leg edema bilaterally. 2+DP/PT pulse bilaterally. Musculoskeletal: No joint deformities, No joint redness or warmth, no limitation of ROM in spin. Skin: No rashes.  Neuro: drowsy, but arousable and oriented X3,  cranial nerves II-XII grossly intact, moves all extremities normally. Psych: Patient is not psychotic, no suicidal or hemocidal ideation.  Labs on Admission: I have personally reviewed following labs and imaging studies  CBC:  Recent Labs Lab 03/30/17 2142  WBC 21.0*  NEUTROABS 18.6*  HGB 11.1*  HCT 34.8*  MCV 78.2  PLT 676*   Basic Metabolic Panel:  Recent Labs Lab 03/30/17 2142  NA 136  K 4.3  CL 96*  CO2 16*  GLUCOSE 205*  BUN 17  CREATININE 1.08*  CALCIUM 9.7  MG 1.6*   GFR: Estimated Creatinine Clearance: 67.4 mL/min (A) (by C-G formula based on SCr of 1.08 mg/dL (H)). Liver Function Tests:  Recent Labs Lab 03/30/17 2142  AST 99*  ALT 61*  ALKPHOS 82  BILITOT 0.8  PROT 9.2*  ALBUMIN 4.3    Recent Labs Lab 03/30/17 2142  LIPASE 20   No results for input(s): AMMONIA in the last 168 hours. Coagulation Profile: No results for input(s): INR, PROTIME in the last 168 hours. Cardiac Enzymes: No results for input(s): CKTOTAL, CKMB, CKMBINDEX, TROPONINI in the last  168 hours. BNP (last 3 results) No results for input(s): PROBNP in the last 8760 hours. HbA1C: No results for input(s): HGBA1C in the last 72 hours. CBG: No results for input(s): GLUCAP in the last 168 hours. Lipid Profile: No results for input(s): CHOL, HDL, LDLCALC, TRIG, CHOLHDL, LDLDIRECT in the last 72 hours. Thyroid Function Tests: No results for input(s): TSH, T4TOTAL, FREET4, T3FREE, THYROIDAB in the last 72 hours. Anemia Panel: No results for input(s): VITAMINB12, FOLATE, FERRITIN, TIBC, IRON, RETICCTPCT in the last 72 hours. Urine analysis:    Component Value Date/Time   COLORURINE YELLOW 03/30/2017 2149   APPEARANCEUR CLOUDY (A) 03/30/2017 2149   LABSPEC 1.027 03/30/2017 2149   PHURINE 5.0 03/30/2017 2149   GLUCOSEU 50 (A) 03/30/2017 2149   HGBUR NEGATIVE 03/30/2017 2149   BILIRUBINUR NEGATIVE 03/30/2017 2149   KETONESUR 80 (A) 03/30/2017 2149   PROTEINUR 100 (A) 03/30/2017 2149   NITRITE NEGATIVE 03/30/2017 2149   LEUKOCYTESUR SMALL (A) 03/30/2017 2149   Sepsis Labs: @LABRCNTIP (procalcitonin:4,lacticidven:4) )No results found for this or any previous visit (from the past 240 hour(s)).   Radiological Exams on Admission: Dg Chest Port 1 View  Result Date: 03/30/2017 CLINICAL DATA:  36 y/o F; nausea and vomiting with hyperventilation. EXAM: PORTABLE CHEST 1 VIEW COMPARISON:  09/04/2016 chest radiograph FINDINGS: Stable heart size and mediastinal contours are within normal limits. Both lungs are clear. The visualized skeletal structures are unremarkable. IMPRESSION: No active disease. Electronically Signed   By: Kristine Garbe M.D.   On: 03/30/2017 22:41     EKG: Independently reviewed.  Sinus rhythm, QTC 501, low voltage   Assessment/Plan Principal Problem:   Alcohol withdrawal ([facility]) Active Problems:   Nausea & vomiting   Sepsis ([place])   DTs (delirium tremens) ([facility])   Hypomagnesemia   Abnormal LFTs   Metabolic acidosis   Gastritis   Abdominal  pain   Depression   Iron deficiency anemia   Alcohol withdrawal/DT: CiWA score was 24 initially, pt is responding to Ativan treatment. She is less tremulous now. She is drowsy, but arousable and oriented X3. EDP consulted PCCM--> Recommended SDU on admission.  -will admit to SDU as inpt -place the patient on an CiWA protocol for withdrawal. -start Librium 5 mg 3 times a day -will give thiamine and folate -Frequent neuro check -seizure precaution -Consult to social work  Sepsis vs. SIRS: patient  meets criteria for for sepsis with leukocytosis, tachycardia, tachypnea. Lactic acid is elevated 7.30-->2.32. Source of infection is not clear. Urinalysis is not impressive chest x-ray negative. Patient states that she had sore throat. -check rapid strep, flu PCR -IV vanco and zosy were started, will continue now -f/u Bx and Ux -will get Procalcitonin and trend lactic acid levels per sepsis protocol. -IVF: 2.5L of NS bolus in ED, followed by 150 cc/h   Nausea & vomiting and AP: most likely due to alcoholic gastritis. Lipase is normal. Her abdomen is located in left lower quadrant. Patient states that she had an ovarian cyst in the past, will get [place] for further eval and treatment. -when necessary hydroxyzine for nausea -when necessary morphine for pain -US-pelvis -IV protonix 40 mg bid  Hypomagnesemia: K=1.6 -repleted  Abnormal LFTs:  abnormal liver function with ALP 82, AST 99, ALT 61, total bilirubin 0.8. Most likely due to alcohol abuse. -Avoid Tylenol -Check hepatitis panel and Tylenol level -check HIV ab  Metabolic acidosis with elevated AG of 24: likely due to lactic acidosis. Alcoholic acidosis also possible. -IV fluid as above -trend lactic acid  Gastritis: -on protonix IV  Depression: pt states that she is taking medication at home, possibly prozac -continue prozac  Iron deficiency anemia: hemoglobin stable 11.1 on admission Continue iron supplement   DVT ppx: SQ  Lovenox Code Status: Full code Family Communication:  Yes, patient's sister at bed side Disposition Plan:  Anticipate discharge back to previous home environment Consults called: PCCM Admission status:  SDU/inpation       Date of Service 03/31/2017    Ivor Costa [facility] Pager [phone]  If 7PM-7AM, please contact night-coverage [email or web address] Password [email or web address] 03/31/2017, 1:36 AM

## 2017-03-31 NOTE — Progress Notes (Signed)
Pharmacy Antibiotic Note  Susan [name] is a 36 y.o. female admitted on 03/30/2017 with sepsis.  Pharmacy has been consulted for zosyn and vancomycin dosing.  Plan: Zosyn 3.375g IV q8h (4 hour infusion).  Vancomycin 1 Gm x1 then 750 mg IV q12h VT=15-20 mg/L Daily Scr F/u cultures and levels as needed  Height: 5\' 6"  (167.6 cm) Weight: 133 lb (60.3 kg) IBW/kg (Calculated) : 59.3  Temp (24hrs), Avg:98.3 F (36.8 C), Min:98.2 F (36.8 C), Max:98.4 F (36.9 C)   Recent Labs Lab 03/30/17 2142 03/30/17 2157 03/31/17 0002  WBC 21.0*  --   --   CREATININE 1.08*  --   --   LATICACIDVEN  --  7.30* 2.32*    Estimated Creatinine Clearance: 67.4 mL/min (A) (by C-G formula based on SCr of 1.08 mg/dL (H)).    Allergies  Allergen Reactions  . Latex Rash    Tape irritates the skin    Antimicrobials this admission: 10/22 zosyn >>  10/22 vancoycin >>   Dose adjustments this admission:   Microbiology results:  BCx:   UCx:    Sputum:    MRSA PCR:   Thank you for allowing pharmacy to be a part of this patient's care.  Lorenza Evangelist 03/31/2017 1:11 AM

## 2017-04-01 ENCOUNTER — Encounter (HOSPITAL_COMMUNITY): Payer: Self-pay | Admitting: Gastroenterology

## 2017-04-01 DIAGNOSIS — N390 Urinary tract infection, site not specified: Secondary | ICD-10-CM

## 2017-04-01 LAB — COMPREHENSIVE METABOLIC PANEL
ALBUMIN: 2.9 g/dL — AB (ref 3.5–5.0)
ALT: 28 U/L (ref 14–54)
ANION GAP: 7 (ref 5–15)
AST: 29 U/L (ref 15–41)
Alkaline Phosphatase: 56 U/L (ref 38–126)
CHLORIDE: 106 mmol/L (ref 101–111)
CO2: 24 mmol/L (ref 22–32)
Calcium: 7.9 mg/dL — ABNORMAL LOW (ref 8.9–10.3)
Creatinine, Ser: 0.63 mg/dL (ref 0.44–1.00)
GFR calc Af Amer: >60 mL/min (ref >60)
Glucose, Bld: 96 mg/dL (ref 65–99)
POTASSIUM: 3.1 mmol/L — AB (ref 3.5–5.1)
Sodium: 137 mmol/L (ref 135–145)
Total Bilirubin: 0.6 mg/dL (ref 0.3–1.2)
Total Protein: 6.1 g/dL — ABNORMAL LOW (ref 6.5–8.1)

## 2017-04-01 LAB — PREPARE RBC (CROSSMATCH)

## 2017-04-01 LAB — CBC WITH DIFFERENTIAL/PLATELET
BASOS ABS: 0.1 10*3/uL (ref 0.0–0.1)
BASOS PCT: 1 %
EOS PCT: 1 %
Eosinophils Absolute: 0 10*3/uL (ref 0.0–0.7)
HCT: 25.1 % — ABNORMAL LOW (ref 36.0–46.0)
Hemoglobin: 7.7 g/dL — ABNORMAL LOW (ref 12.0–15.0)
Lymphocytes Relative: 34 %
Lymphs Abs: 2.4 10*3/uL (ref 0.7–4.0)
MCH: 25.1 pg — ABNORMAL LOW (ref 26.0–34.0)
MCHC: 30.7 g/dL (ref 30.0–36.0)
MCV: 81.8 fL (ref 78.0–100.0)
MONO ABS: 0.6 10*3/uL (ref 0.1–1.0)
Monocytes Relative: 8 %
Neutro Abs: 4.1 10*3/uL (ref 1.7–7.7)
Neutrophils Relative %: 57 %
PLATELETS: 235 10*3/uL (ref 150–400)
RBC: 3.07 MIL/uL — ABNORMAL LOW (ref 3.87–5.11)
RDW: 18.5 % — AB (ref 11.5–15.5)
WBC: 7.1 10*3/uL (ref 4.0–10.5)

## 2017-04-01 LAB — OCCULT BLOOD X 1 CARD TO LAB, STOOL: FECAL OCCULT BLD: NEGATIVE

## 2017-04-01 LAB — HEPATITIS PANEL, ACUTE
HCV Ab: <0.1 s/co ratio (ref 0.0–0.9)
HEP B C IGM: NEGATIVE
HEP B S AG: NEGATIVE
Hep A IgM: NEGATIVE

## 2017-04-01 LAB — MAGNESIUM: MAGNESIUM: 2.1 mg/dL (ref 1.7–2.4)

## 2017-04-01 LAB — GLUCOSE, CAPILLARY: GLUCOSE-CAPILLARY: 97 mg/dL (ref 65–99)

## 2017-04-01 LAB — LIPASE, BLOOD: Lipase: 20 U/L (ref 11–51)

## 2017-04-01 MED ORDER — KCL IN DEXTROSE-NACL 10-5-0.45 MEQ/L-%-% IV SOLN
INTRAVENOUS | Status: DC
  Administered 2017-04-01: 22:00:00 via INTRAVENOUS
  Filled 2017-04-01 (×2): qty 1000

## 2017-04-01 MED ORDER — CHLORDIAZEPOXIDE HCL 5 MG PO CAPS
5.0000 mg | ORAL_CAPSULE | Freq: Every day | ORAL | Status: AC
  Administered 2017-04-02: 5 mg via ORAL
  Filled 2017-04-01: qty 1

## 2017-04-01 MED ORDER — CHLORDIAZEPOXIDE HCL 5 MG PO CAPS: 5.0000 mg | ORAL_CAPSULE | Freq: Two times a day (BID) | ORAL | Status: DC

## 2017-04-01 MED ORDER — CEPHALEXIN 500 MG PO CAPS
500.0000 mg | ORAL_CAPSULE | Freq: Two times a day (BID) | ORAL | Status: DC
  Administered 2017-04-01 – 2017-04-02 (×2): 500 mg via ORAL
  Filled 2017-04-01 (×2): qty 1

## 2017-04-01 MED ORDER — SODIUM CHLORIDE 0.9 % IV SOLN
Freq: Once | INTRAVENOUS | Status: AC
  Administered 2017-04-01: 18:00:00 via INTRAVENOUS

## 2017-04-01 MED ORDER — POTASSIUM CHLORIDE 10 MEQ/100ML IV SOLN
10.0000 meq | INTRAVENOUS | Status: AC
  Administered 2017-04-01 (×4): 10 meq via INTRAVENOUS
  Filled 2017-04-01 (×4): qty 100

## 2017-04-01 NOTE — Progress Notes (Signed)
PROGRESS NOTE  Susan [name] ZOX:[id number] DOB: 1980-09-29 DOA: 03/30/2017 PCP: Artelia Laroche, CNM  HPI/Recap of past 24 hours:  She report epigastric pain, central chest pain, reports hematemesis has stopped and stool is dark brown, but her hgb dropped to 7.7 today  No fever, No agitation, no confusion, no tremor, she tolerated clears  Mother at bedside  Assessment/Plan: Principal Problem:   Alcohol withdrawal ([place]) Active Problems:   Nausea & vomiting   Sepsis ([place])   DTs (delirium tremens) ([place])   Hypomagnesemia   Abnormal LFTs   Metabolic acidosis   Gastritis   Abdominal pain   Depression   Iron deficiency anemia   Alcohol withdrawal -Initially admitted to stepdown due to severity of symptom. -She improved and transferred to med tele  -She seems a little oversedated on 10/23, cut down scheduled librium which was started on admission, plan for last dose on 10/24. -continue civa protocol, she has not need much prn ativan -She is oriented x3, no confusion, no agitation. -Patient is interested to be discharged from the hospital to residential substance abuse treatment facility.social worker consulted.   Sepsis? From uti -She presented with tachycardia, tachypnea, leukocytosis, lactic acidosis, no fever -Blood Culture no growth, cxr no acute findings. ua concerns for uti, urine culture pending -She is started on vanc/zosyn on admission, this is changed to rocephin then keflex ( urine culture in 10/2016 has pansensitive ecoli)  Hypokalemia and hypomagnesemia: replace k/mag  Metabolic acidosis: resolved.  Elevated lft, alcohol hepatitis?   hepatitis panel/hiv negative lft normalized. 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   H/o GERD/gastritis: on ppi, reports epigastric pain and central chest pain ( esophagitis?) Tolerating clears, continue hydration, change ivf from ns to d5/1/2saline with k.  Acute blood loss  anemia:  She reports hematemesis that has stopped, but hgb dropped from 11 to 7.7  Likely from dilutional from hydration and possible bleed from gastritis,  On ppi,  prbc transfusion x1unit on 10/23.  She is on iron supplement at home for heavy periods, continue iron supplement. [facility] consulted.  Depression/axiety: denies SI/HI,  Education officer, museum consulted: per social worker"The patient has two outpatient therapist/cousnelor for her substance use, anxiety depression.She is prescribed Prozac 20 mg. She reports she has not been able to take the medication in three days because of her nausea" She is started on prozac here.  Code Status: full  Family Communication: patient and mother at bedside  Disposition Plan: transfer to med tele   Consultants:  Critical care  Procedures:  none  Antibiotics:  Vanc/zosyn initially  Rocephin from 10/22 to 10/23  Keflex from 10/23   Objective: BP 113/76   Pulse 98   Temp 98.9 F (37.2 C) (Oral)   Resp 18   Ht 5' 2"  (1.575 m)   Wt 64.3 kg (141 lb 12.1 oz)   LMP 03/13/2017   SpO2 100%   BMI 25.93 kg/m   Intake/Output Summary (Last 24 hours) at 04/01/17 1802 Last data filed at 04/01/17 1519  Gross per 24 hour  Intake           4167.5 ml  Output                0 ml  Net           4167.5 ml   Filed Weights   03/30/17 2106 03/31/17 0235  Weight: 60.3 kg (133 lb) 64.3 kg (141 lb 12.1  oz)    Exam: Patient is examined daily including today on 04/01/2017, exams remain the same as of yesterday except that has changed    General:  NAD, seems a little sedated, but oriented x3  Cardiovascular: sinus tachycardia has resolved  Respiratory: CTABL  Abdomen: mild epigastric tenderness, no guarding, no rebound, positive BS  Musculoskeletal: No edema  Neuro: mildly sedated,  oriented x3  Data Reviewed: Basic Metabolic Panel:  Recent Labs Lab 03/30/17 2142 04/01/17 0402  NA 136 137  K 4.3 3.1*  CL 96* 106  CO2 16* 24    GLUCOSE 205* 96  BUN 17 <5*  CREATININE 1.08* 0.63  CALCIUM 9.7 7.9*  MG 1.6* 2.1   Liver Function Tests:  Recent Labs Lab 03/30/17 2142 04/01/17 0402  AST 99* 29  ALT 61* 28  ALKPHOS 82 56  BILITOT 0.8 0.6  PROT 9.2* 6.1*  ALBUMIN 4.3 2.9*    Recent Labs Lab 03/30/17 2142 04/01/17 0402  LIPASE 20 20   No results for input(s): AMMONIA in the last 168 hours. CBC:  Recent Labs Lab 03/30/17 2142 04/01/17 0402  WBC 21.0* 7.1  NEUTROABS 18.6* 4.1  HGB 11.1* 7.7*  HCT 34.8* 25.1*  MCV 78.2 81.8  PLT 405* 235   Cardiac Enzymes:   No results for input(s): CKTOTAL, CKMB, CKMBINDEX, TROPONINI in the last 168 hours. BNP (last 3 results) No results for input(s): BNP in the last 8760 hours.  ProBNP (last 3 results) No results for input(s): PROBNP in the last 8760 hours.  CBG:  Recent Labs Lab 03/31/17 0733 04/01/17 0909  GLUCAP 77 97    Recent Results (from the past 240 hour(s))  Blood Culture (routine x 2)     Status: None (Preliminary result)   Collection Time: 03/30/17 11:01 PM  Result Value Ref Range Status   Specimen Description BLOOD RIGHT ANTECUBITAL  Final   Special Requests   Final    BOTTLES DRAWN AEROBIC AND ANAEROBIC Blood Culture adequate volume   Culture   Final    NO GROWTH 1 DAY Performed at [facility], [address], [place], [place] [address]    Report Status PENDING  Incomplete  Blood Culture (routine x 2)     Status: None (Preliminary result)   Collection Time: 03/30/17 11:13 PM  Result Value Ref Range Status   Specimen Description BLOOD LEFT HAND  Final   Special Requests AEROBIC BOTTLE ONLY Blood Culture adequate volume  Final   Culture   Final    NO GROWTH 1 DAY Performed at [facility], [place] [address], [place], [place] [address]    Report Status PENDING  Incomplete  Rapid strep screen (not at [facility])     Status: None   Collection Time: 03/31/17 12:58 AM  Result Value Ref Range Status   Streptococcus, Group A  Screen (Direct) NEGATIVE NEGATIVE Final    Comment: (NOTE) A Rapid Antigen test may result negative if the antigen level in the sample is below the detection level of this test. The FDA has not cleared this test as a stand-alone test therefore the rapid antigen negative result has reflexed to a Group A Strep culture.   Culture, group A strep     Status: None (Preliminary result)   Collection Time: 03/31/17 12:58 AM  Result Value Ref Range Status   Specimen Description THROAT  Final   Special Requests NONE  Final   Culture   Final    CULTURE  REINCUBATED FOR BETTER GROWTH Performed at [facility], [place] [address] [place], [place] [address]    Report Status PENDING  Incomplete  MRSA PCR Screening     Status: None   Collection Time: 03/31/17  2:03 AM  Result Value Ref Range Status   MRSA by PCR NEGATIVE NEGATIVE Final    Comment:        The GeneXpert MRSA Assay (FDA approved for NASAL specimens only), is one component of a comprehensive MRSA colonization surveillance program. It is not intended to diagnose MRSA infection nor to guide or monitor treatment for MRSA infections.      Studies: No results found.  Scheduled Meds: . [START ON 04/02/2017] chlordiazePOXIDE  5 mg Oral Daily  . dicyclomine  20 mg Oral BID  . ferrous sulfate  325 mg Oral Q breakfast  . FLUoxetine  10 mg Oral QHS  . folic acid  1 mg Intravenous Daily  . loratadine  10 mg Oral Daily  . LORazepam  0-4 mg Intravenous Q6H   Or  . LORazepam  0-4 mg Oral Q6H  . [START ON 04/02/2017] LORazepam  0-4 mg Intravenous Q12H   Or  . [START ON 04/02/2017] LORazepam  0-4 mg Oral Q12H  . magnesium oxide  400 mg Oral Daily  . multivitamin with minerals  2 tablet Oral Daily  . pantoprazole (PROTONIX) IV  40 mg Intravenous Q12H  . thiamine  100 mg Oral Daily   Or  . thiamine  100 mg Intravenous Daily  . ursodiol  300 mg Oral Daily  . vitamin B-12  100 mcg Oral Daily    Continuous Infusions: . sodium  chloride 150 mL/hr at 04/01/17 0954  . sodium chloride    . cefTRIAXone (ROCEPHIN)  IV Stopped (04/01/17 1549)     Time spent: 35 mins  I have personally reviewed and interpreted on  04/01/2017 daily labs, tele strips, imagings as discussed above under date review session and assessment and plans.  I reviewed all nursing notes, pharmacy notes,  vitals, pertinent old records  I have discussed plan of care as described above with RN , patient  on 04/01/2017  Case discussed with [facility] Dr Watt Climes. Nalah Macioce MD, PhD  [facility] Pager [phone]. If 7PM-7AM, please contact night-coverage at [email or web address], password [facility] 04/01/2017, 6:02 PM  LOS: 1 day

## 2017-04-01 NOTE — Consult Note (Signed)
Reason for Consult: Anemia questionable GI blood loss Referring Physician: Hospital team  Susan [name] is an 36 y.o. female.  HPI: Patient seen and examined and case discussed with the hospital team and her office computer chart reviewed as well and her hospital computer chart reviewed as well as her previous endoscopy and she is actually feeling better and she is tolerating clear liquids today and her stools are dark brown and she's been on iron because of heavy periods for a while and her gynecologist follows her hemoglobin and she has been on probiotics and pump inhibitors at home and tends to use Tylenol and minimize his aspirin and nonsteroidals and multiple family members have GI problems problems and she no she's been drinking too much lately and has no other complaints  Past Medical History:  Diagnosis Date  . Alcohol abuse   . Depression   . Gastritis   . GIB (gastrointestinal bleeding)   . Iron deficiency anemia   . Pancreatitis     Past Surgical History:  Procedure Laterality Date  . ESOPHAGOGASTRODUODENOSCOPY (EGD) WITH PROPOFOL N/A 06/05/2016   Procedure: ESOPHAGOGASTRODUODENOSCOPY (EGD) WITH PROPOFOL;  Surgeon: Parag Brahmbhatt, MD;  Location: [facility];  Service: Gastroenterology;  Laterality: N/A;    Family History  Problem Relation Age of Onset  . Stroke Mother     Social History:  reports that she has quit smoking. She has never used smokeless tobacco. She reports that she drinks alcohol. She reports that she does not use drugs.  Allergies:  Allergies  Allergen Reactions  . Latex Rash    Tape irritates the skin    Medications: I have reviewed the patient's current medications.  Results for orders placed or performed during the hospital encounter of 03/30/17 (from the past 48 hour(s))  CBC with Differential     Status: Abnormal   Collection Time: 03/30/17  9:42 PM  Result Value Ref Range   WBC 21.0 (H) 4.0 - 10.5 K/uL   RBC 4.45 3.87 - 5.11 MIL/uL    Hemoglobin 11.1 (L) 12.0 - 15.0 g/dL   HCT 34.8 (L) 36.0 - 46.0 %   MCV 78.2 78.0 - 100.0 fL   MCH 24.9 (L) 26.0 - 34.0 pg   MCHC 31.9 30.0 - 36.0 g/dL   RDW 18.6 (H) 11.5 - 15.5 %   Platelets 405 (H) 150 - 400 K/uL   Neutrophils Relative % 89 %   Neutro Abs 18.6 (H) 1.7 - 7.7 K/uL   Lymphocytes Relative 8 %   Lymphs Abs 1.7 0.7 - 4.0 K/uL   Monocytes Relative 2 %   Monocytes Absolute 0.5 0.1 - 1.0 K/uL   Eosinophils Relative 0 %   Eosinophils Absolute 0.0 0.0 - 0.7 K/uL   Basophils Relative 1 %   Basophils Absolute 0.1 0.0 - 0.1 K/uL  Ethanol     Status: None   Collection Time: 03/30/17  9:42 PM  Result Value Ref Range   Alcohol, Ethyl (B) <10 <10 mg/dL    Comment:        LOWEST DETECTABLE LIMIT FOR SERUM ALCOHOL IS 10 mg/dL FOR MEDICAL PURPOSES ONLY   Comprehensive metabolic panel     Status: Abnormal   Collection Time: 03/30/17  9:42 PM  Result Value Ref Range   Sodium 136 135 - 145 mmol/L    Comment: REPEATED TO VERIFY   Potassium 4.3 3.5 - 5.1 mmol/L    Comment: REPEATED TO VERIFY   Chloride 96 (L) 101 - 111   mmol/L    Comment: REPEATED TO VERIFY   CO2 16 (L) 22 - 32 mmol/L    Comment: REPEATED TO VERIFY   Glucose, Bld 205 (H) 65 - 99 mg/dL   BUN 17 6 - 20 mg/dL   Creatinine, Ser 1.08 (H) 0.44 - 1.00 mg/dL   Calcium 9.7 8.9 - 10.3 mg/dL    Comment: REPEATED TO VERIFY   Total Protein 9.2 (H) 6.5 - 8.1 g/dL   Albumin 4.3 3.5 - 5.0 g/dL   AST 99 (H) 15 - 41 U/L   ALT 61 (H) 14 - 54 U/L   Alkaline Phosphatase 82 38 - 126 U/L   Total Bilirubin 0.8 0.3 - 1.2 mg/dL   GFR calc non Af Amer >60 >60 mL/min   GFR calc Af Amer >60 >60 mL/min    Comment: (NOTE) The eGFR has been calculated using the CKD EPI equation. This calculation has not been validated in all clinical situations. eGFR's persistently <60 mL/min signify possible Chronic Kidney Disease.    Anion gap 24 (H) 5 - 15    Comment: REPEATED TO VERIFY  Lipase, blood     Status: None   Collection Time:  03/30/17  9:42 PM  Result Value Ref Range   Lipase 20 11 - 51 U/L  Magnesium     Status: Abnormal   Collection Time: 03/30/17  9:42 PM  Result Value Ref Range   Magnesium 1.6 (L) 1.7 - 2.4 mg/dL  Urinalysis, Routine w reflex microscopic     Status: Abnormal   Collection Time: 03/30/17  9:49 PM  Result Value Ref Range   Color, Urine YELLOW YELLOW   APPearance CLOUDY (A) CLEAR   Specific Gravity, Urine 1.027 1.005 - 1.030   pH 5.0 5.0 - 8.0   Glucose, UA 50 (A) NEGATIVE mg/dL   Hgb urine dipstick NEGATIVE NEGATIVE   Bilirubin Urine NEGATIVE NEGATIVE   Ketones, ur 80 (A) NEGATIVE mg/dL   Protein, ur 100 (A) NEGATIVE mg/dL   Nitrite NEGATIVE NEGATIVE   Leukocytes, UA SMALL (A) NEGATIVE   RBC / HPF 0-5 0 - 5 RBC/hpf   WBC, UA 6-30 0 - 5 WBC/hpf   Bacteria, UA RARE (A) NONE SEEN   Squamous Epithelial / LPF 0-5 (A) NONE SEEN   Mucus PRESENT    Hyaline Casts, UA PRESENT    Crystals PRESENT (A) NEGATIVE  Urine rapid drug screen (hosp performed)     Status: None   Collection Time: 03/30/17  9:49 PM  Result Value Ref Range   Opiates NONE DETECTED NONE DETECTED   Cocaine NONE DETECTED NONE DETECTED   Benzodiazepines NONE DETECTED NONE DETECTED   Amphetamines NONE DETECTED NONE DETECTED   Tetrahydrocannabinol NONE DETECTED NONE DETECTED   Barbiturates NONE DETECTED NONE DETECTED    Comment:        DRUG SCREEN FOR MEDICAL PURPOSES ONLY.  IF CONFIRMATION IS NEEDED FOR ANY PURPOSE, NOTIFY LAB WITHIN 5 DAYS.        LOWEST DETECTABLE LIMITS FOR URINE DRUG SCREEN Drug Class       Cutoff (ng/mL) Amphetamine      1000 Barbiturate      200 Benzodiazepine   200 Tricyclics       300 Opiates          300 Cocaine          300 THC              50   I-Stat beta hCG blood,   ED     Status: None   Collection Time: 03/30/17  9:55 PM  Result Value Ref Range   I-stat hCG, quantitative <5.0 <5 mIU/mL   Comment 3            Comment:   GEST. AGE      CONC.  (mIU/mL)   <=1 WEEK        5 - 50      2 WEEKS       50 - 500     3 WEEKS       100 - 10,000     4 WEEKS     1,000 - 30,000        FEMALE AND NON-PREGNANT FEMALE:     LESS THAN 5 mIU/mL   I-Stat CG4 Lactic Acid, ED     Status: Abnormal   Collection Time: 03/30/17  9:57 PM  Result Value Ref Range   Lactic Acid, Venous 7.30 (HH) 0.5 - 1.9 mmol/L   Comment NOTIFIED PHYSICIAN   I-Stat CG4 Lactic Acid, ED     Status: Abnormal   Collection Time: 03/31/17 12:02 AM  Result Value Ref Range   Lactic Acid, Venous 2.32 (HH) 0.5 - 1.9 mmol/L   Comment NOTIFIED PHYSICIAN   Rapid strep screen (not at [facility])     Status: None   Collection Time: 03/31/17 12:58 AM  Result Value Ref Range   Streptococcus, Group A Screen (Direct) NEGATIVE NEGATIVE    Comment: (NOTE) A Rapid Antigen test may result negative if the antigen level in the sample is below the detection level of this test. The FDA has not cleared this test as a stand-alone test therefore the rapid antigen negative result has reflexed to a Group A Strep culture.   Culture, group A strep     Status: None (Preliminary result)   Collection Time: 03/31/17 12:58 AM  Result Value Ref Range   Specimen Description THROAT    Special Requests NONE    Culture      CULTURE REINCUBATED FOR BETTER GROWTH Performed at [facility], [address] [place], [place] [address]    Report Status PENDING   Influenza panel by PCR (type A & B)     Status: None   Collection Time: 03/31/17 12:59 AM  Result Value Ref Range   Influenza A By PCR NEGATIVE NEGATIVE   Influenza B By PCR NEGATIVE NEGATIVE    Comment: (NOTE) The Xpert Xpress Flu assay is intended as an aid in the diagnosis of  influenza and should not be used as a sole basis for treatment.  This  assay is FDA approved for nasopharyngeal swab specimens only. Nasal  washings and aspirates are unacceptable for Xpert Xpress Flu testing.   hCG, quantitative, pregnancy     Status: None   Collection Time: 03/31/17 12:59 AM  Result  Value Ref Range   hCG, Beta Chain, Quant, S <1 <5 mIU/mL    Comment:          GEST. AGE      CONC.  (mIU/mL)   <=1 WEEK        5 - 50     2 WEEKS       50 - 500     3 WEEKS       100 - 10,000     4 WEEKS     1,000 - 30,000     5 WEEKS     3,500 - 115,000     6-8 WEEKS     12,000 - 270,000    12 WEEKS     15,000 - 220,000        FEMALE AND NON-PREGNANT FEMALE:     LESS THAN 5 mIU/mL   Acetaminophen level     Status: Abnormal   Collection Time: 03/31/17  1:27 AM  Result Value Ref Range   Acetaminophen (Tylenol), Serum <10 (L) 10 - 30 ug/mL    Comment:        THERAPEUTIC CONCENTRATIONS VARY SIGNIFICANTLY. A RANGE OF 10-30 ug/mL MAY BE AN EFFECTIVE CONCENTRATION FOR MANY PATIENTS. HOWEVER, SOME ARE BEST TREATED AT CONCENTRATIONS OUTSIDE THIS RANGE. ACETAMINOPHEN CONCENTRATIONS >150 ug/mL AT 4 HOURS AFTER INGESTION AND >50 ug/mL AT 12 HOURS AFTER INGESTION ARE OFTEN ASSOCIATED WITH TOXIC REACTIONS.   Salicylate level     Status: None   Collection Time: 03/31/17  1:27 AM  Result Value Ref Range   Salicylate Lvl <7.0 2.8 - 30.0 mg/dL  Hepatitis panel, acute     Status: None   Collection Time: 03/31/17  1:27 AM  Result Value Ref Range   Hepatitis B Surface Ag Negative Negative   HCV Ab <0.1 0.0 - 0.9 s/co ratio    Comment: (NOTE)                                  Negative:     < 0.8                             Indeterminate: 0.8 - 0.9                                  Positive:     > 0.9 The CDC recommends that a positive HCV antibody result be followed up with a HCV Nucleic Acid Amplification test (177939). Performed At: [facility] [place], [place] [id number] Lindon Romp MD [id number]    Hep A IgM Negative Negative   Hep B C IgM Negative Negative  Lactic acid, plasma     Status: None   Collection Time: 03/31/17  1:27 AM  Result Value Ref Range   Lactic Acid, Venous 1.0 0.5 - 1.9 mmol/L  Procalcitonin     Status: None   Collection Time:  03/31/17  1:27 AM  Result Value Ref Range   Procalcitonin <0.10 ng/mL    Comment:        Interpretation: PCT (Procalcitonin) <= 0.5 ng/mL: Systemic infection (sepsis) is not likely. Local bacterial infection is possible. (NOTE)         ICU PCT Algorithm               Non ICU PCT Algorithm    ----------------------------     ------------------------------         PCT < 0.25 ng/mL                 PCT < 0.1 ng/mL     Stopping of antibiotics            Stopping of antibiotics       strongly encouraged.               strongly encouraged.    ----------------------------     ------------------------------       PCT level decrease by  PCT < 0.25 ng/mL       >= 80% from peak PCT       OR PCT 0.25 - 0.5 ng/mL          Stopping of antibiotics                                             encouraged.     Stopping of antibiotics           encouraged.    ----------------------------     ------------------------------       PCT level decrease by              PCT >= 0.25 ng/mL       < 80% from peak PCT        AND PCT >= 0.5 ng/mL            Continuin g antibiotics                                              encouraged.       Continuing antibiotics            encouraged.    ----------------------------     ------------------------------     PCT level increase compared          PCT > 0.5 ng/mL         with peak PCT AND          PCT >= 0.5 ng/mL             Escalation of antibiotics                                          strongly encouraged.      Escalation of antibiotics        strongly encouraged.   Protime-INR     Status: None   Collection Time: 03/31/17  1:27 AM  Result Value Ref Range   Prothrombin Time 14.3 11.4 - 15.2 seconds   INR 1.12   HIV antibody (Routine Testing)     Status: None   Collection Time: 03/31/17  1:27 AM  Result Value Ref Range   HIV Screen 4th Generation wRfx Non Reactive Non Reactive    Comment: (NOTE) Performed At: [facility] [address] [place], [place] [id number] [place] William F MD Ph:[phone]   MRSA PCR Screening     Status: None   Collection Time: 03/31/17  2:03 AM  Result Value Ref Range   MRSA by PCR NEGATIVE NEGATIVE    Comment:        The GeneXpert MRSA Assay (FDA approved for NASAL specimens only), is one component of a comprehensive MRSA colonization surveillance program. It is not intended to diagnose MRSA infection nor to guide or monitor treatment for MRSA infections.   Lactic acid, plasma     Status: None   Collection Time: 03/31/17  4:09 AM  Result Value Ref Range   Lactic Acid, Venous 0.6 0.5 - 1.9 mmol/L  Glucose, capillary     Status: None   Collection Time: 03/31/17  7:33 AM  Result Value Ref Range     Glucose-Capillary 77 65 - 99 mg/dL  CBC with Differential/Platelet     Status: Abnormal   Collection Time: 04/01/17  4:02 AM  Result Value Ref Range   WBC 7.1 4.0 - 10.5 K/uL   RBC 3.07 (L) 3.87 - 5.11 MIL/uL   Hemoglobin 7.7 (L) 12.0 - 15.0 g/dL    Comment: DELTA CHECK NOTED REPEATED TO VERIFY    HCT 25.1 (L) 36.0 - 46.0 %   MCV 81.8 78.0 - 100.0 fL   MCH 25.1 (L) 26.0 - 34.0 pg   MCHC 30.7 30.0 - 36.0 g/dL   RDW 18.5 (H) 11.5 - 15.5 %   Platelets 235 150 - 400 K/uL   Neutrophils Relative % 57 %   Neutro Abs 4.1 1.7 - 7.7 K/uL   Lymphocytes Relative 34 %   Lymphs Abs 2.4 0.7 - 4.0 K/uL   Monocytes Relative 8 %   Monocytes Absolute 0.6 0.1 - 1.0 K/uL   Eosinophils Relative 1 %   Eosinophils Absolute 0.0 0.0 - 0.7 K/uL   Basophils Relative 1 %   Basophils Absolute 0.1 0.0 - 0.1 K/uL  Comprehensive metabolic panel     Status: Abnormal   Collection Time: 04/01/17  4:02 AM  Result Value Ref Range   Sodium 137 135 - 145 mmol/L   Potassium 3.1 (L) 3.5 - 5.1 mmol/L    Comment: DELTA CHECK NOTED   Chloride 106 101 - 111 mmol/L   CO2 24 22 - 32 mmol/L   Glucose, Bld 96 65 - 99 mg/dL   BUN <5 (L) 6 - 20 mg/dL   Creatinine, Ser 0.63 0.44 - 1.00 mg/dL   Calcium 7.9 (L) 8.9 -  10.3 mg/dL   Total Protein 6.1 (L) 6.5 - 8.1 g/dL   Albumin 2.9 (L) 3.5 - 5.0 g/dL   AST 29 15 - 41 U/L   ALT 28 14 - 54 U/L   Alkaline Phosphatase 56 38 - 126 U/L   Total Bilirubin 0.6 0.3 - 1.2 mg/dL   GFR calc non Af Amer >60 >60 mL/min   GFR calc Af Amer >60 >60 mL/min    Comment: (NOTE) The eGFR has been calculated using the CKD EPI equation. This calculation has not been validated in all clinical situations. eGFR's persistently <60 mL/min signify possible Chronic Kidney Disease.    Anion gap 7 5 - 15  Magnesium     Status: None   Collection Time: 04/01/17  4:02 AM  Result Value Ref Range   Magnesium 2.1 1.7 - 2.4 mg/dL  Lipase, blood     Status: None   Collection Time: 04/01/17  4:02 AM  Result Value Ref Range   Lipase 20 11 - 51 U/L  Glucose, capillary     Status: None   Collection Time: 04/01/17  9:09 AM  Result Value Ref Range   Glucose-Capillary 97 65 - 99 mg/dL    Us Pelvis (transabdominal Only)  Result Date: 03/31/2017 CLINICAL DATA:  Initial evaluation for acute left lower quadrant pain. History of ovarian cyst. EXAM: TRANSABDOMINAL ULTRASOUND OF PELVIS TECHNIQUE: Transabdominal ultrasound examination of the pelvis was performed including evaluation of the uterus, ovaries, adnexal regions, and pelvic cul-de-sac. COMPARISON:  Prior CT from 02/28/2017. FINDINGS: Uterus Measurements: 7.6 x 3.8 x 5.0 cm. Lobulated uterine contour, consistent with known fibroids. Largest of these measures approximately 2.3 cm 2.2 cm. Remaining fibroids not well delineated on this transabdominal scan. Endometrium Thickness: 7.2 mm.  No focal abnormality visualized. Right ovary Measurements:   4.2 x 2.3 x 2.1 cm. Normal appearance/no adnexal mass. Left ovary Measurements: 2.9 x 1.9 x 2.4 cm. Normal appearance/no adnexal mass. Other findings:  No abnormal free fluid. IMPRESSION: 1. Fibroid uterus as above. 2. Normal sonographic appearance of the ovaries. No adnexal mass or other acute abnormality.  Electronically Signed   By: Benjamin  McClintock M.D.   On: 03/31/2017 02:13   Dg Chest Port 1 View  Result Date: 03/30/2017 CLINICAL DATA:  36 y/o F; nausea and vomiting with hyperventilation. EXAM: PORTABLE CHEST 1 VIEW COMPARISON:  09/04/2016 chest radiograph FINDINGS: Stable heart size and mediastinal contours are within normal limits. Both lungs are clear. The visualized skeletal structures are unremarkable. IMPRESSION: No active disease. Electronically Signed   By: Lance  Furusawa-Stratton M.D.   On: 03/30/2017 22:41    ROS negative except above Blood pressure 121/86, pulse 96, temperature 98.6 F (37 C), temperature source Oral, resp. rate 18, height 5' 2" (1.575 m), weight 64.3 kg (141 lb 12.1 oz), last menstrual period 03/13/2017, SpO2 98 %. 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     

## 2017-04-01 NOTE — Progress Notes (Signed)
Patients mother spoke with this nurse concerning the history of her daughter wanting to harm herself. This nurse spoke to patient and asked the patient if she felt any harm towards herself and this patient stated "No but I have had some thoughts after my divorce earlier on in the year". Patient is calm, cooperative and sociable. We also talked about the option of going straight to rehab facility from the hospital and the patient is willing to get help in any way.  Will continue to monitor patient closely.

## 2017-04-01 NOTE — Progress Notes (Signed)
Patient having left sided CP radiating towards her back. Verbal order given for EKG per on call [place]. Placed in pt chart. Will continue to monitor closely.

## 2017-04-02 DIAGNOSIS — F10230 Alcohol dependence with withdrawal, uncomplicated: Secondary | ICD-10-CM

## 2017-04-02 LAB — CBC WITH DIFFERENTIAL/PLATELET
BASOS ABS: 0.1 10*3/uL (ref 0.0–0.1)
BASOS PCT: 2 %
EOS PCT: 2 %
Eosinophils Absolute: 0.1 10*3/uL (ref 0.0–0.7)
HCT: 29 % — ABNORMAL LOW (ref 36.0–46.0)
Hemoglobin: 8.9 g/dL — ABNORMAL LOW (ref 12.0–15.0)
LYMPHS PCT: 47 %
Lymphs Abs: 2.6 10*3/uL (ref 0.7–4.0)
MCH: 25.6 pg — ABNORMAL LOW (ref 26.0–34.0)
MCHC: 30.7 g/dL (ref 30.0–36.0)
MCV: 83.3 fL (ref 78.0–100.0)
MONO ABS: 0.5 10*3/uL (ref 0.1–1.0)
Monocytes Relative: 10 %
Neutro Abs: 2.1 10*3/uL (ref 1.7–7.7)
Neutrophils Relative %: 39 %
PLATELETS: 235 10*3/uL (ref 150–400)
RBC: 3.48 MIL/uL — ABNORMAL LOW (ref 3.87–5.11)
RDW: 18.4 % — AB (ref 11.5–15.5)
WBC: 5.4 10*3/uL (ref 4.0–10.5)

## 2017-04-02 LAB — TYPE AND SCREEN
ABO/RH(D): A POS
Antibody Screen: NEGATIVE
Unit division: 0

## 2017-04-02 LAB — BASIC METABOLIC PANEL
ANION GAP: 6 (ref 5–15)
BUN: <5 mg/dL — ABNORMAL LOW (ref 6–20)
CALCIUM: 8.6 mg/dL — AB (ref 8.9–10.3)
CO2: 24 mmol/L (ref 22–32)
CREATININE: 0.68 mg/dL (ref 0.44–1.00)
Chloride: 108 mmol/L (ref 101–111)
GFR calc Af Amer: >60 mL/min (ref >60)
GLUCOSE: 108 mg/dL — AB (ref 65–99)
Potassium: 3.8 mmol/L (ref 3.5–5.1)
Sodium: 138 mmol/L (ref 135–145)

## 2017-04-02 LAB — GLUCOSE, CAPILLARY: Glucose-Capillary: 116 mg/dL — ABNORMAL HIGH (ref 65–99)

## 2017-04-02 LAB — HEPATITIS PANEL, ACUTE
HEP A IGM: NEGATIVE
HEP B C IGM: NEGATIVE
Hepatitis B Surface Ag: NEGATIVE

## 2017-04-02 LAB — CULTURE, GROUP A STREP (THRC)

## 2017-04-02 LAB — BPAM RBC
Blood Product Expiration Date: 201811042359
ISSUE DATE / TIME: 201810231752
Unit Type and Rh: 6200

## 2017-04-02 LAB — MAGNESIUM: Magnesium: 1.9 mg/dL (ref 1.7–2.4)

## 2017-04-02 MED ORDER — CEPHALEXIN 500 MG PO CAPS: 500.0000 mg | ORAL_CAPSULE | Freq: Two times a day (BID) | ORAL | 0 refills | Status: DC

## 2017-04-02 MED ORDER — FOLIC ACID 1 MG PO TABS
1.0000 mg | ORAL_TABLET | Freq: Every day | ORAL | Status: DC
  Administered 2017-04-02: 1 mg via ORAL
  Filled 2017-04-02: qty 1

## 2017-04-02 MED ORDER — FLUOXETINE HCL 10 MG PO CAPS: 10.0000 mg | ORAL_CAPSULE | Freq: Every day | ORAL | 0 refills | Status: DC

## 2017-04-02 MED ORDER — PANTOPRAZOLE SODIUM 40 MG PO TBEC: 40.0000 mg | DELAYED_RELEASE_TABLET | Freq: Two times a day (BID) | ORAL | 0 refills | Status: DC

## 2017-04-02 MED ORDER — PANTOPRAZOLE SODIUM 40 MG PO TBEC
40.0000 mg | DELAYED_RELEASE_TABLET | Freq: Two times a day (BID) | ORAL | Status: DC
  Administered 2017-04-02: 40 mg via ORAL
  Filled 2017-04-02: qty 1

## 2017-04-02 NOTE — Progress Notes (Signed)
The patient is receiving Protonix by the intravenous route.  Based on criteria approved by the Pharmacy and Therapeutics Committee and the Medical Executive Committee, the medication is being converted to the equivalent oral dose form.  These criteria include: -No active GI bleeding -Able to tolerate diet of full liquids (or better) or tube feeding -Able to tolerate other medications by the oral or enteral route  If you have any questions about this conversion, please contact the Pharmacy Department (phone [phone]).  Thank you. Herby Abraham, Pharm.D. [phone] 04/02/2017 8:16 AM

## 2017-04-02 NOTE — Discharge Summary (Signed)
Physician Discharge Summary  Susan [name] EHM:[id number] DOB: May 13, 1981 DOA: 03/30/2017  PCP: Artelia Laroche, CNM  Admit date: 03/30/2017 Discharge date: 04/02/2017  Recommendations for Outpatient Follow-up:  1. Pt will need to follow up with PCP in 2-3 weeks post discharge 2. Please obtain BMP to evaluate electrolytes and kidney function, K level  3. Please also check CBC to evaluate Hg and Hct levels  4. Please note that CT abd was notable for Multiple small, somewhat linear areas of decreased density throughout the liver. This appearance can be seen with sclerosing cholangitis. Consider ursodiol once outpatient GI follow up established.   Discharge Diagnoses:  Principal Problem:   Alcohol withdrawal ([place]) Active Problems:   Nausea & vomiting   Sepsis ([place])  Discharge Condition: Stable  Diet recommendation: Heart healthy diet discussed in details   History of present illness:  Pt is 36 yo female with known alcohol use, presented with hematemesis and alcohol withdrawal.  Assessment/Plan: Alcohol withdrawal - Initially admitted to stepdown due to severity of symptom. - improved, no signs of withdrawal  - stable for discharge   Sepsis? From uti - She presented with tachycardia, tachypnea, leukocytosis, lactic acidosis, no fever - continue Keflex for two more days post discharge   Hypokalemia and hypomagnesemia - supplemented   Metabolic acidosis - resolved   Elevated lft, alcohol hepatitis?  - hepatitis panel/hiv negative - LFT's stabilized  - Ct ab? sclerosing cholangitis , oupt follow up with GI to determine if if ursodiol needed   H/o GERD/gastritis: - keep on PPI - avoid alcohol, cessation addressed   Acute blood loss anemia - cont iron supplementation   Depression/axiety: denies SI/HI - cont Prozac   Code Status: full  Consultants:  Critical care  GI  Procedures:  none  Antibiotics:  Vanc/zosyn initially  Rocephin from  10/22 to 10/23  Keflex from 10/23  Procedures/Studies: US Pelvis (transabdominal Only)  Result Date: 03/31/2017 CLINICAL DATA:  Initial evaluation for acute left lower quadrant pain. History of ovarian cyst. EXAM: TRANSABDOMINAL ULTRASOUND OF PELVIS TECHNIQUE: Transabdominal ultrasound examination of the pelvis was performed including evaluation of the uterus, ovaries, adnexal regions, and pelvic cul-de-sac. COMPARISON:  Prior CT from 02/28/2017. FINDINGS: Uterus Measurements: 7.6 x 3.8 x 5.0 cm. Lobulated uterine contour, consistent with known fibroids. Largest of these measures approximately 2.3 cm 2.2 cm. Remaining fibroids not well delineated on this transabdominal scan. Endometrium Thickness: 7.2 mm.  No focal abnormality visualized. Right ovary Measurements: 4.2 x 2.3 x 2.1 cm. Normal appearance/no adnexal mass. Left ovary Measurements: 2.9 x 1.9 x 2.4 cm. Normal appearance/no adnexal mass. Other findings:  No abnormal free fluid. IMPRESSION: 1. Fibroid uterus as above. 2. Normal sonographic appearance of the ovaries. No adnexal mass or other acute abnormality. Electronically Signed   By: Jeannine Boga M.D.   On: 03/31/2017 02:13   Dg Chest Port 1 View  Result Date: 03/30/2017 CLINICAL DATA:  36 y/o F; nausea and vomiting with hyperventilation. EXAM: PORTABLE CHEST 1 VIEW COMPARISON:  09/04/2016 chest radiograph FINDINGS: Stable heart size and mediastinal contours are within normal limits. Both lungs are clear. The visualized skeletal structures are unremarkable. IMPRESSION: No active disease. Electronically Signed   By: Kristine Garbe M.D.   On: 03/30/2017 22:41     Discharge Exam: Vitals:   04/01/17 2114 04/02/17 0522  BP: 123/89 133/88  Pulse: 87 89  Resp: 18 18  Temp: 98.8 F (37.1 C) 98.4 F (36.9 C)  SpO2: 100% 97%  Vitals:   04/01/17 1815 04/01/17 2110 04/01/17 2114 04/02/17 0522  BP: 108/78 123/89 123/89 133/88  Pulse: 95 87 87 89  Resp: 18 18 18 18    Temp: 98.2 F (36.8 C) 98.8 F (37.1 C) 98.8 F (37.1 C) 98.4 F (36.9 C)  TempSrc: Axillary Oral Oral Oral  SpO2: 100% 100% 100% 97%  Weight:      Height:        General: Pt is alert, follows commands appropriately, not in acute distress Cardiovascular: Regular rate and rhythm, S1/S2 +, no murmurs, no rubs, no gallops Respiratory: Clear to auscultation bilaterally, no wheezing, no crackles, no rhonchi Abdominal: Soft, non tender, non distended, bowel sounds +, no guarding Extremities: no edema, no cyanosis, pulses palpable bilaterally DP and PT Neuro: Grossly nonfocal  Discharge Instructions  Discharge Instructions    Diet - low sodium heart healthy    Complete by:  As directed    Increase activity slowly    Complete by:  As directed      Allergies as of 04/02/2017      Reactions   Latex Rash   Tape irritates the skin      Medication List    STOP taking these medications   omeprazole 20 MG capsule Commonly known as:  PRILOSEC   ondansetron 4 MG disintegrating tablet Commonly known as:  ZOFRAN ODT     TAKE these medications   acetaminophen 325 MG tablet Commonly known as:  TYLENOL Take 1 tablet (325 mg total) by mouth every 6 (six) hours as needed for mild pain, fever or headache. What changed:  how much to take   B-12 PO Take 1 tablet by mouth daily.   cephALEXin 500 MG capsule Commonly known as:  KEFLEX Take 1 capsule (500 mg total) by mouth every 12 (twelve) hours.   dicyclomine 20 MG tablet Commonly known as:  BENTYL Take 1 tablet (20 mg total) by mouth 2 (two) times daily.   ferrous sulfate 325 (65 FE) MG tablet Take 325 mg by mouth daily with breakfast.   FLUoxetine 10 MG capsule Commonly known as:  PROZAC Take 1 capsule (10 mg total) by mouth at bedtime.   hydrOXYzine 25 MG tablet Commonly known as:  ATARAX/VISTARIL Take 1 tablet (25 mg total) by mouth every 6 (six) hours. What changed:  when to take this  reasons to take this    magnesium oxide 400 MG tablet Commonly known as:  MAG-OX TAKE 1 DAILY TABLET WITH IRON   metoCLOPramide 10 MG tablet Commonly known as:  REGLAN Take 1 tablet (10 mg total) by mouth every 6 (six) hours. What changed:  when to take this  reasons to take this   multivitamin with minerals Tabs tablet Take 2 tablets by mouth daily.   pantoprazole 40 MG tablet Commonly known as:  PROTONIX Take 1 tablet (40 mg total) by mouth 2 (two) times daily. What changed:  when to take this   potassium chloride SA 20 MEQ tablet Commonly known as:  K-DUR,KLOR-CON Take 2 tablets (40 mEq total) by mouth daily.   SPRINTEC 28 0.25-35 MG-MCG tablet Generic drug:  norgestimate-ethinyl estradiol Take 1 tablet by mouth daily.      Follow-up Information    Brahmbhatt, Parag, MD Follow up in 1 month(s).   Specialty:  Gastroenterology Why:  abnormal liver function, concerning for sclerosing cholangitis. Contact information: [place] [address] [phone]        Artelia Laroche, CNM  Follow up in 1 week(s).   Specialty:  Obstetrics and Gynecology Why:  hospital discharge follow up, repeat basic labs works including cbc/cmp at follow up. Contact information: [place] [address] [phone]            The results of significant diagnostics from this hospitalization (including imaging, microbiology, ancillary and laboratory) are listed below for reference.     Microbiology: Recent Results (from the past 240 hour(s))  Blood Culture (routine x 2)     Status: None (Preliminary result)   Collection Time: 03/30/17 11:01 PM  Result Value Ref Range Status   Specimen Description BLOOD RIGHT ANTECUBITAL  Final   Special Requests   Final    BOTTLES DRAWN AEROBIC AND ANAEROBIC Blood Culture adequate volume   Culture   Final    NO GROWTH 2 DAYS Performed at [facility], [address], [place], [place] [address]    Report Status PENDING   Incomplete  Blood Culture (routine x 2)     Status: None (Preliminary result)   Collection Time: 03/30/17 11:13 PM  Result Value Ref Range Status   Specimen Description BLOOD LEFT HAND  Final   Special Requests AEROBIC BOTTLE ONLY Blood Culture adequate volume  Final   Culture   Final    NO GROWTH 2 DAYS Performed at [facility], [place] [address] [place], [place] [address]    Report Status PENDING  Incomplete  Rapid strep screen (not at [facility])     Status: None   Collection Time: 03/31/17 12:58 AM  Result Value Ref Range Status   Streptococcus, Group A Screen (Direct) NEGATIVE NEGATIVE Final    Comment: (NOTE) A Rapid Antigen test may result negative if the antigen level in the sample is below the detection level of this test. The FDA has not cleared this test as a stand-alone test therefore the rapid antigen negative result has reflexed to a Group A Strep culture.   Culture, group A strep     Status: None   Collection Time: 03/31/17 12:58 AM  Result Value Ref Range Status   Specimen Description THROAT  Final   Special Requests NONE  Final   Culture   Final    NO GROUP A STREP (S.PYOGENES) ISOLATED Performed at [facility], [address], [place], [place] [address]    Report Status 04/02/2017 FINAL  Final  MRSA PCR Screening     Status: None   Collection Time: 03/31/17  2:03 AM  Result Value Ref Range Status   MRSA by PCR NEGATIVE NEGATIVE Final    Comment:        The GeneXpert MRSA Assay (FDA approved for NASAL specimens only), is one component of a comprehensive MRSA colonization surveillance program. It is not intended to diagnose MRSA infection nor to guide or monitor treatment for MRSA infections.      Labs: Basic Metabolic Panel:  Recent Labs Lab 03/30/17 2142 04/01/17 0402 04/02/17 0449  NA 136 137 138  K 4.3 3.1* 3.8  CL 96* 106 108  CO2 16* 24 24  GLUCOSE 205* 96 108*  BUN 17 <5* <5*  CREATININE 1.08* 0.63 0.68  CALCIUM 9.7 7.9* 8.6*   MG 1.6* 2.1 1.9   Liver Function Tests:  Recent Labs Lab 03/30/17 2142 04/01/17 0402  AST 99* 29  ALT 61* 28  ALKPHOS 82 56  BILITOT 0.8 0.6  PROT 9.2* 6.1*  ALBUMIN 4.3 2.9*  Recent Labs Lab 03/30/17 2142 04/01/17 0402  LIPASE 20 20   CBC:  Recent Labs Lab 03/30/17 2142 04/01/17 0402 04/02/17 0449  WBC 21.0* 7.1 5.4  NEUTROABS 18.6* 4.1 2.1  HGB 11.1* 7.7* 8.9*  HCT 34.8* 25.1* 29.0*  MCV 78.2 81.8 83.3  PLT 405* 235 235   CBG:  Recent Labs Lab 03/31/17 0733 04/01/17 0909 04/02/17 0739  GLUCAP 77 97 116*   SIGNED: Time coordinating discharge: 60 minutes  Faye Ramsay, MD  [facility] 04/02/2017, 10:58 AM Pager [phone]  If 7PM-7AM, please contact night-coverage [email or web address] Password [email or web address]

## 2017-04-02 NOTE — Progress Notes (Signed)
Subjective: Abdominal pain much improved.  Objective: Vital signs in last 24 hours: Temp:  [98.2 F (36.8 C)-98.9 F (37.2 C)] 98.4 F (36.9 C) (10/24 0522) Pulse Rate:  [87-98] 89 (10/24 0522) Resp:  [18] 18 (10/24 0522) BP: (108-133)/(76-89) 133/88 (10/24 0522) SpO2:  [97 %-100 %] 97 % (10/24 0522) Weight change:  Last BM Date: 04/01/17  PE: GEN:  NAD ABD:  Very mild epigastric tenderness, no peritonitis  Lab Results: CBC    Component Value Date/Time   WBC 5.4 04/02/2017 0449   RBC 3.48 (L) 04/02/2017 0449   HGB 8.9 (L) 04/02/2017 0449   HCT 29.0 (L) 04/02/2017 0449   PLT 235 04/02/2017 0449   MCV 83.3 04/02/2017 0449   MCH 25.6 (L) 04/02/2017 0449   MCHC 30.7 04/02/2017 0449   RDW 18.4 (H) 04/02/2017 0449   LYMPHSABS 2.6 04/02/2017 0449   MONOABS 0.5 04/02/2017 0449   EOSABS 0.1 04/02/2017 0449   BASOSABS 0.1 04/02/2017 0449   CMP     Component Value Date/Time   NA 138 04/02/2017 0449   K 3.8 04/02/2017 0449   CL 108 04/02/2017 0449   CO2 24 04/02/2017 0449   GLUCOSE 108 (H) 04/02/2017 0449   BUN <5 (L) 04/02/2017 0449   CREATININE 0.68 04/02/2017 0449   CALCIUM 8.6 (L) 04/02/2017 0449   PROT 6.1 (L) 04/01/2017 0402   ALBUMIN 2.9 (L) 04/01/2017 0402   AST 29 04/01/2017 0402   ALT 28 04/01/2017 0402   ALKPHOS 56 04/01/2017 0402   BILITOT 0.6 04/01/2017 0402   GFRNONAA >60 04/02/2017 0449   GFRAA >60 04/02/2017 0449   Assessment:  1.  Anemia, possibly EtoH gastritis. 2.  Hematemesis, coffee grounds, resolved, similar presentation with EGD < 1 year ago. 3.  Epigastric pain, much improved.  Plan:  1.  Advance diet. 2.  PPI. 3.  Follow CBC. 4.  OK to discharge home today on PPI with EtOH cessation and close outpatient follow-up with CBC in one week and outpatient GI appt within 3-4 weeks. 5.  [facility] will sign-off.   Susan [name] 04/02/2017, 9:33 AM   Cell [phone] If no answer or after 5 PM call [phone]

## 2017-04-02 NOTE — Progress Notes (Signed)
Met with pt and her mother to summarize her plans for following up with substance use treatment referrals at [place]. Discussed at length with pt alone and then with mother present also. Pt vacillates between agreeing to residential treatment and wanting intensive outpatient due to wanting to stay home and continue working during treatment. CSW discussed what both would entail.  Mother has contacted [facility] and pt agrees to pursue admission there. Signed consent (filed in chart) to send information to [facility] to assist with admissions process- CSW faxed information requested. Also provided extensive SAIOP information should be opt not to enter residential treatment once home. Pt very agreeable.   , MSW, LCSW Clinical Social Work 04/02/2017 [phone] 

## 2017-04-02 NOTE — Discharge Instructions (Signed)
Gastritis, Adult  Gastritis is swelling (inflammation) of the stomach. When you have this condition, you can have these problems (symptoms):  ? Pain in your stomach.  ? A burning feeling in your stomach.  ? Feeling sick to your stomach (nauseous).  ? Throwing up (vomiting).  ? Feeling too full after you eat.  It is important to get help for this condition. Without help, your stomach can bleed, and you can get sores (ulcers) in your stomach.  Follow these instructions at home:  ? Take over-the-counter and prescription medicines only as told by your doctor.  ? If you were prescribed an antibiotic medicine, take it as told by your doctor. Do not stop taking it even if you start to feel better.  ? Drink enough fluid to keep your pee (urine) clear or pale yellow.  ? Instead of eating big meals, eat small meals often.  Contact a health care provider if:  ? Your problems get worse.  ? Your problems go away and then come back.  Get help right away if:  ? You throw up blood or something that looks like coffee grounds.  ? You have black or dark red poop (stools).  ? You cannot keep fluids down.  ? Your stomach pain gets worse.  ? You have a fever.  ? You do not feel better after 1 week.  This information is not intended to replace advice given to you by your health care provider. Make sure you discuss any questions you have with your health care provider.  Document Released: 11/13/2007 Document Revised: 01/24/2016 Document Reviewed: 02/18/2015  Elsevier Interactive Patient Education ? 2018 [employer]vier Inc[employer]

## 2017-04-05 LAB — CULTURE, BLOOD (ROUTINE X 2)
Culture: NO GROWTH
Culture: NO GROWTH
SPECIAL REQUESTS: ADEQUATE
Special Requests: ADEQUATE

## 2018-01-14 IMAGING — CT CT L SPINE W/O CM
3 of 4 series · 9 of 33 positions shown, 11 images · IV contrast (APPLIED)
Comparison: Abdomen and pelvis CT obtained today. Previous abdomen
and pelvis CT examinations dated 01/27/2017 and 06/04/2016.

CLINICAL DATA: Numbness and tingling in both legs.

EXAM:
CT LUMBAR SPINE WITHOUT CONTRAST
TECHNIQUE: Multidetector CT imaging of the lumbar spine was performed without
intravenous contrast administration. Multiplanar CT image
reconstructions were also generated.

[Series 7: lspine axial · axial · 0.27mm/px · z∈[-482,-482]mm · 1 of 137 slices shown, 2 images]
[im 69/137  soft-tissue]
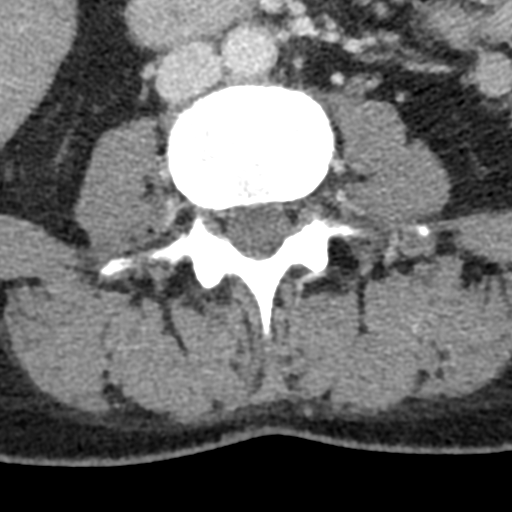
[im 69/137  bone]
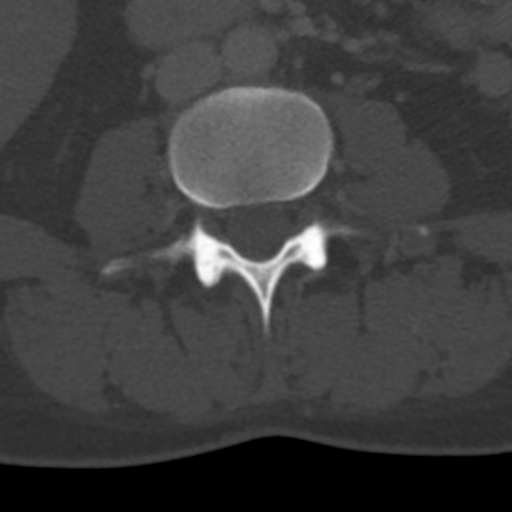

[Series 8: lspine coronal · coronal · 0.27mm/px · 3 of 70 slices shown]
[im 14/70  bone]
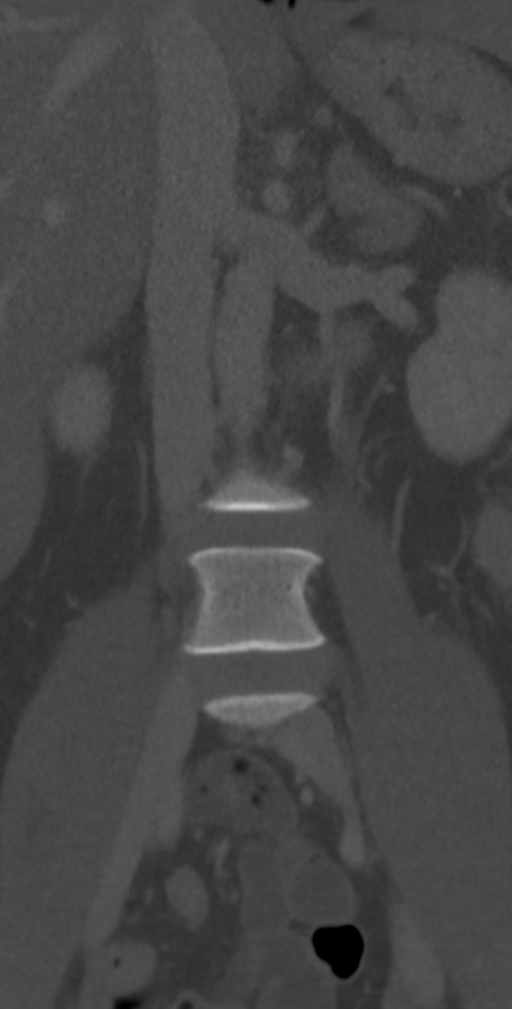
[im 28/70  bone]
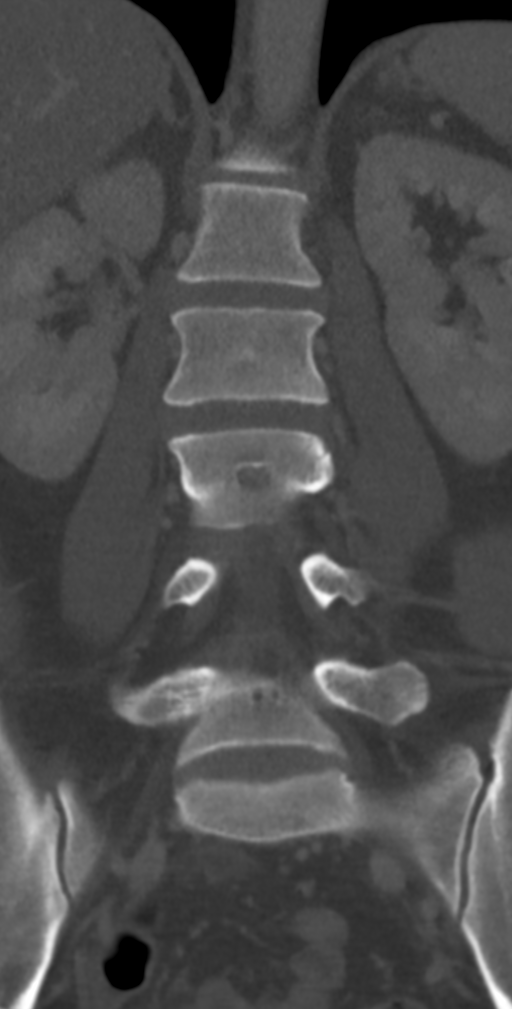
[im 42/70  bone]
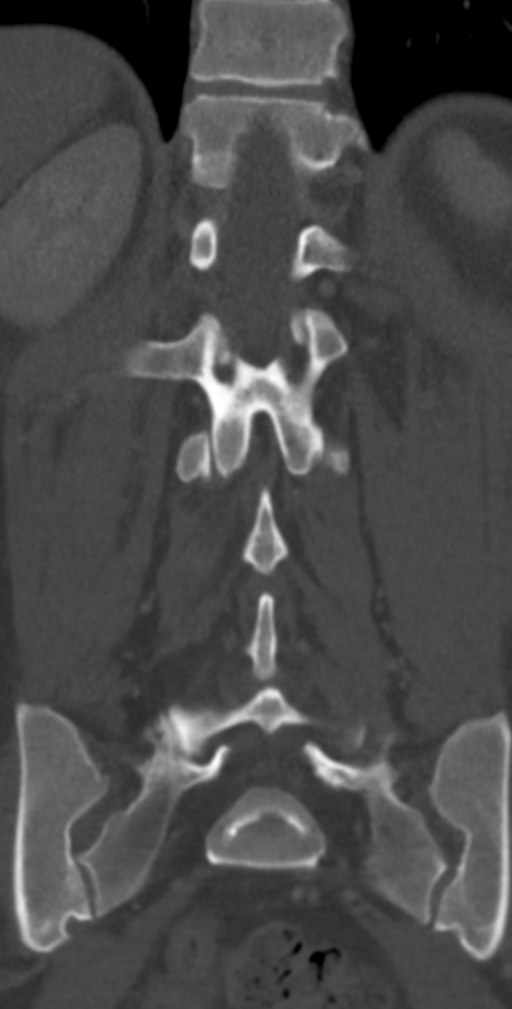

[Series 9: lspine sagittal · sagittal · 0.27mm/px · 5 of 70 slices shown, 6 images]
[im 24/70  bone]
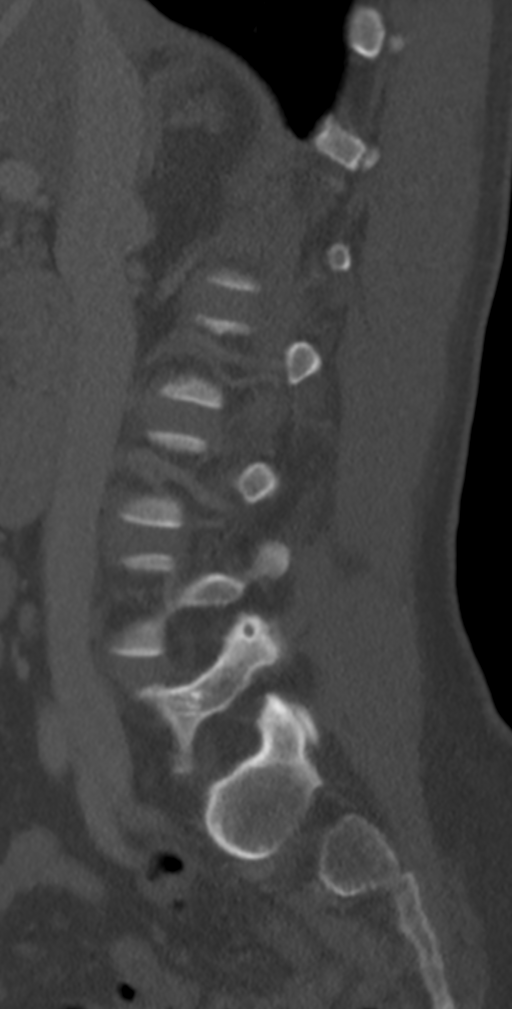
[im 29/70  bone]
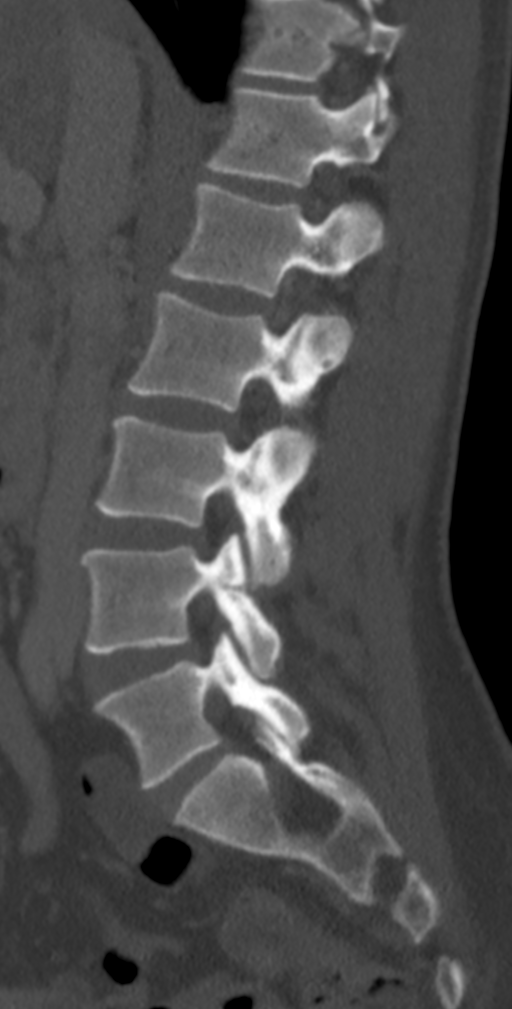
[im 35/70  soft-tissue]
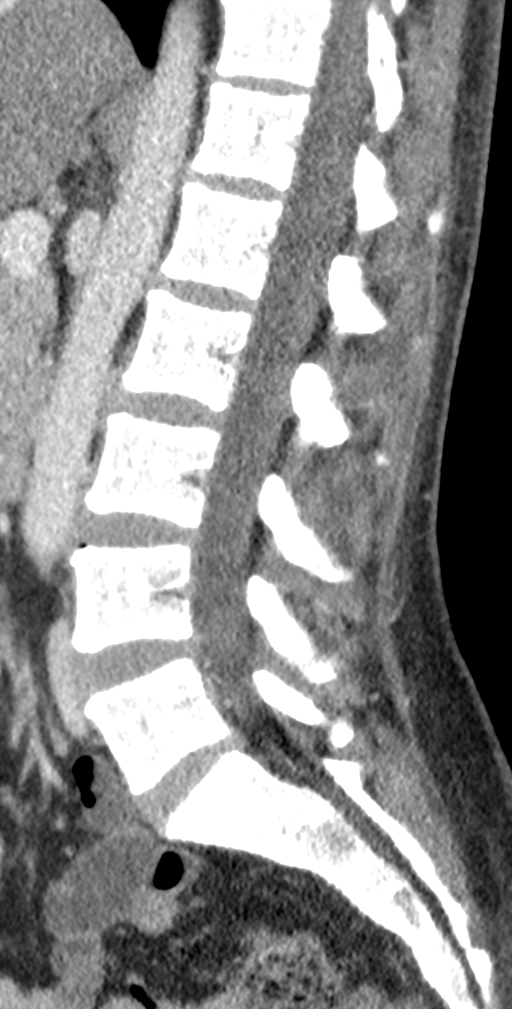
[im 35/70  bone]
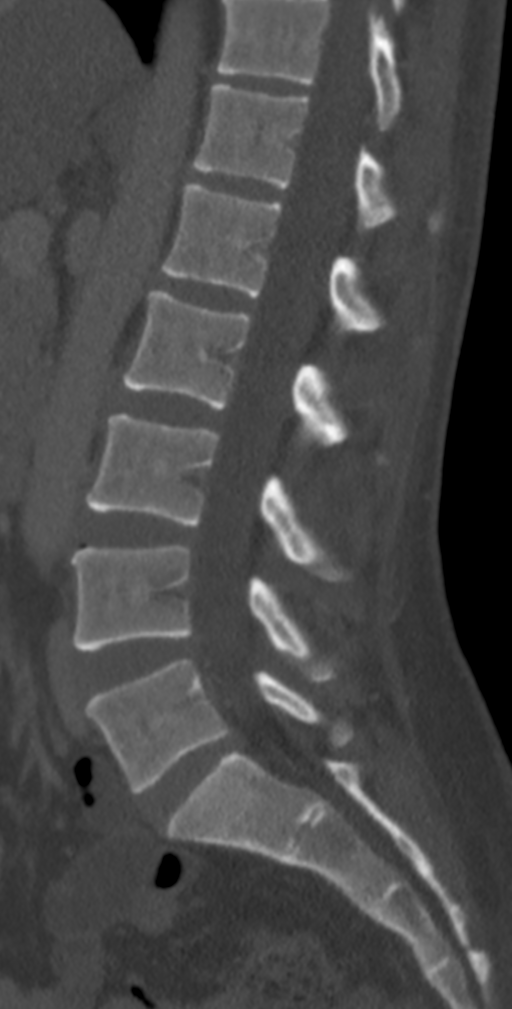
[im 41/70  bone]
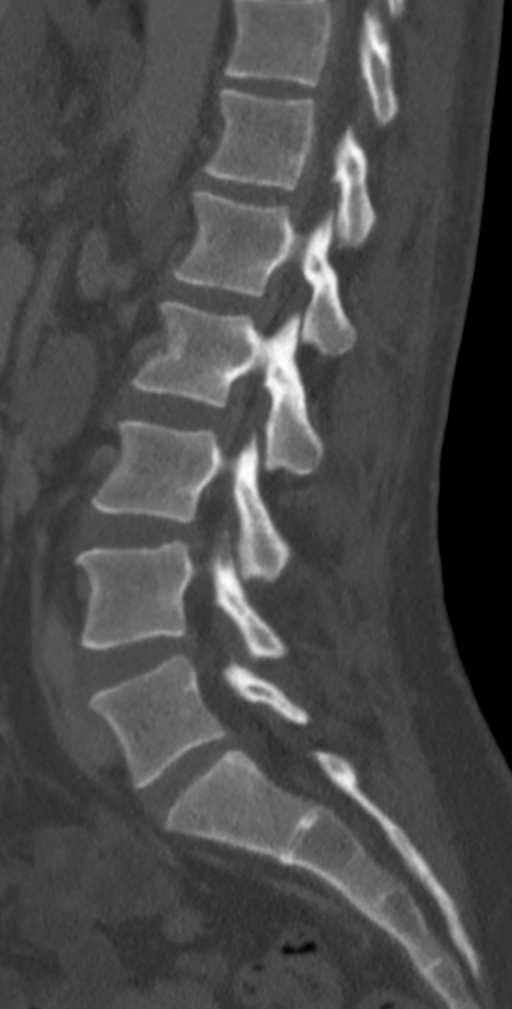
[im 47/70  bone]
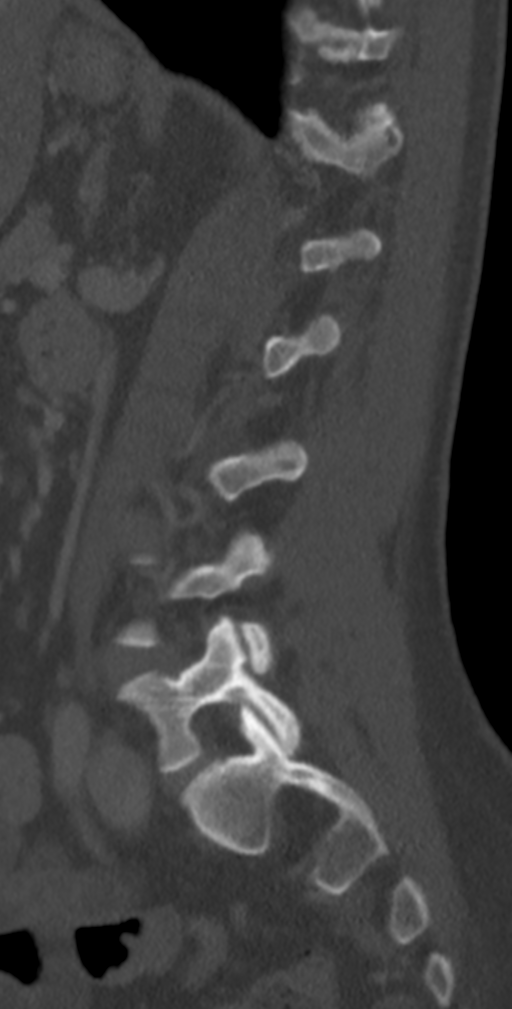

[9 of 33 positions shown; findings below may reference images not displayed]

FINDINGS: Segmentation: 5 non-rib-bearing lumbar vertebrae.

Alignment: Normal.

Vertebrae: No fractures or pars defects.

Paraspinal and other soft tissues: Unremarkable.

Disc levels: Mild anterior spur formation at the L3-4 level. No disc
protrusions, canal stenosis or foraminal stenosis.
IMPRESSION: Mild anterior spondylosis at the L3-4 level. Otherwise, normal
examination.

## 2018-01-14 IMAGING — CT CT ABD-PELV W/ CM
2 of 4 series · 16 of 46 positions shown, 18 images · IV contrast (APPLIED)
Comparison: 01/27/2017.

CLINICAL DATA: Increased lower abdominal pain and vomiting for the
past week.

EXAM:
CT ABDOMEN AND PELVIS WITH CONTRAST
TECHNIQUE: Multidetector CT imaging of the abdomen and pelvis was performed
using the standard protocol following bolus administration of
intravenous contrast.
CONTRAST:  100mL SBT6FR-JAA IOPAMIDOL (SBT6FR-JAA) INJECTION 61%

[Series 2: axial st · axial · 0.63mm/px · z∈[-728,-334]mm · 13 of 87 slices shown, 15 images]
[im 4/87  soft-tissue]
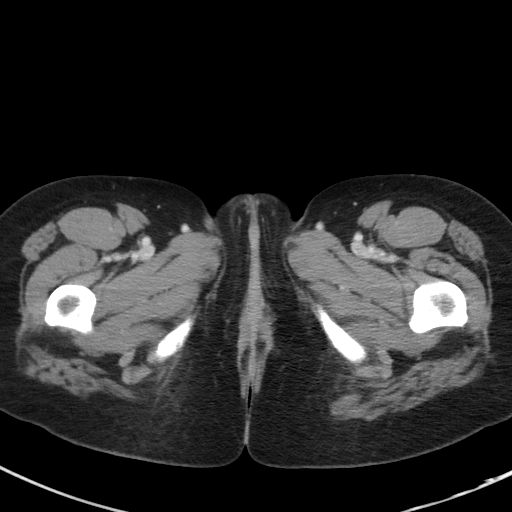
[im 4/87  bone]
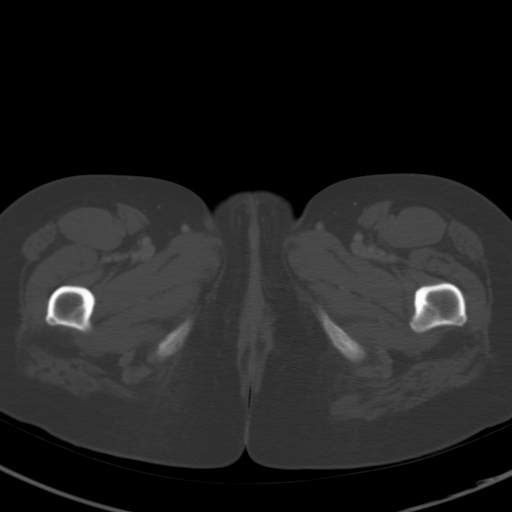
[im 11/87  soft-tissue]
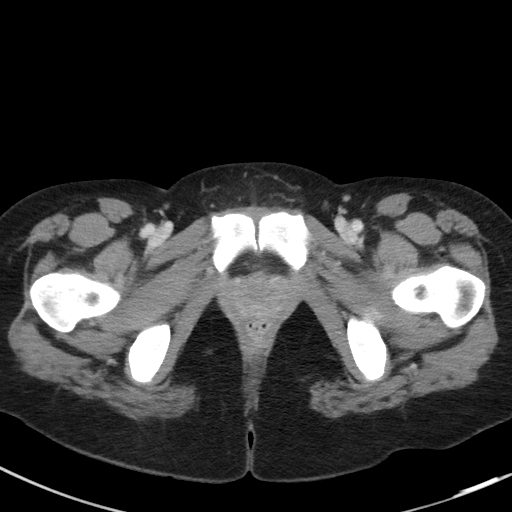
[im 18/87  soft-tissue]
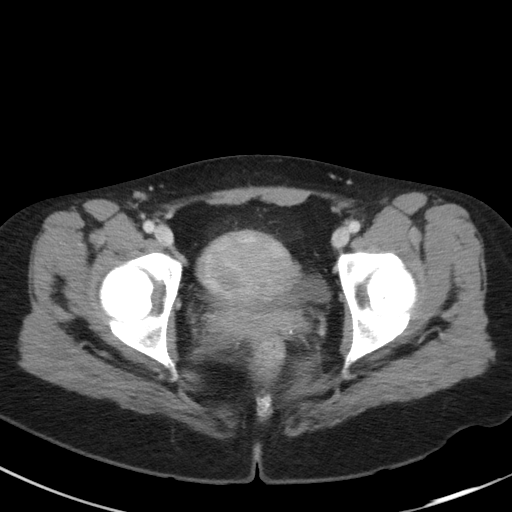
[im 26/87  soft-tissue]
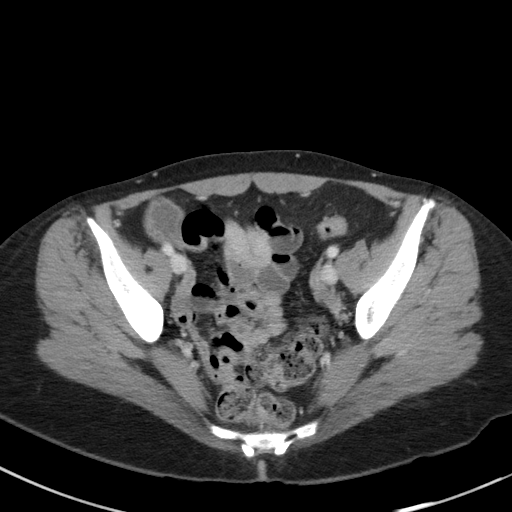
[im 29/87  soft-tissue]
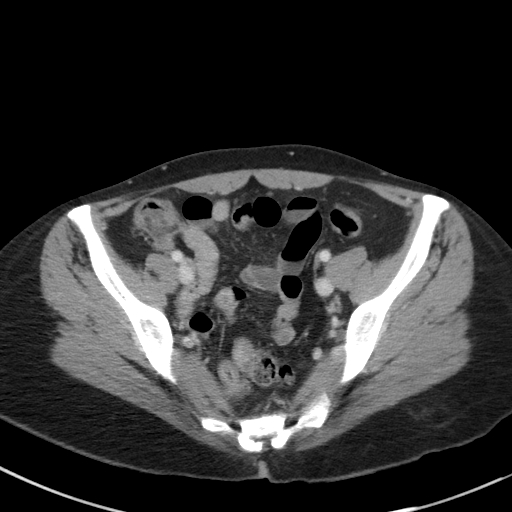
[im 36/87  soft-tissue]
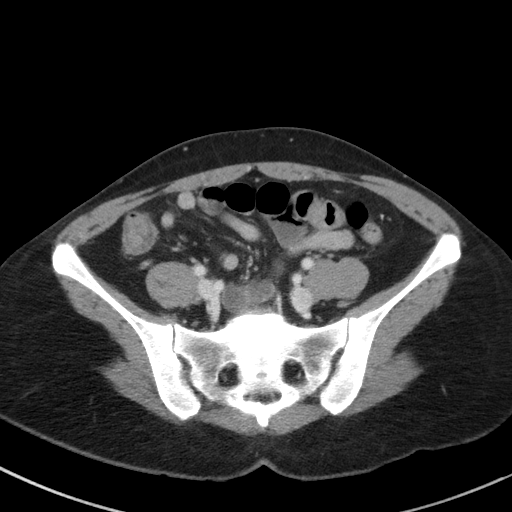
[im 44/87  soft-tissue]
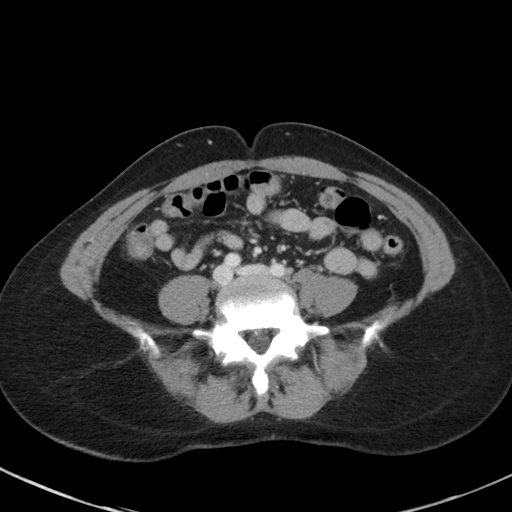
[im 51/87  soft-tissue]
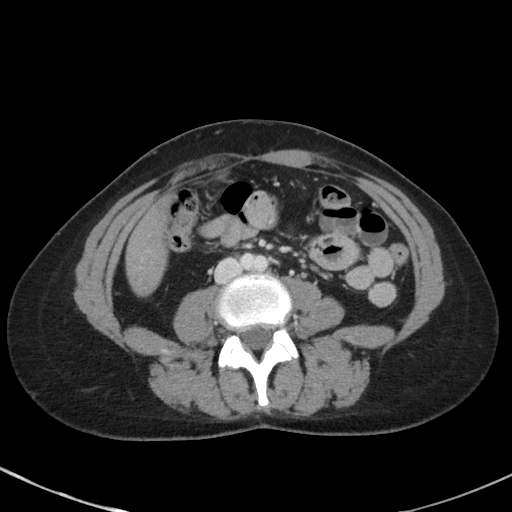
[im 58/87  soft-tissue]
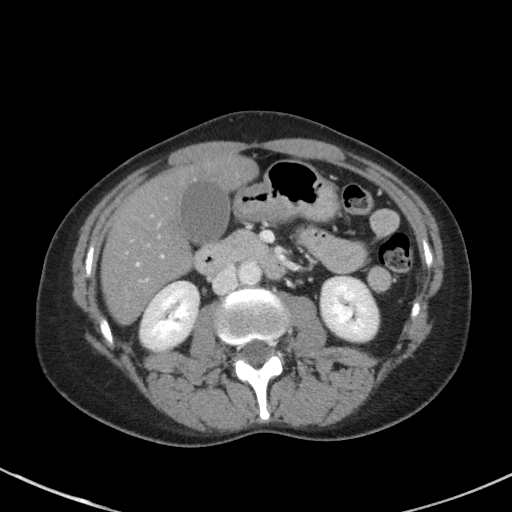
[im 58/87  bone]
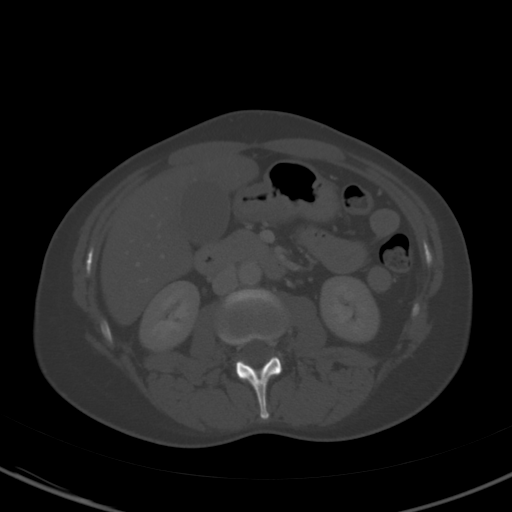
[im 61/87  soft-tissue]
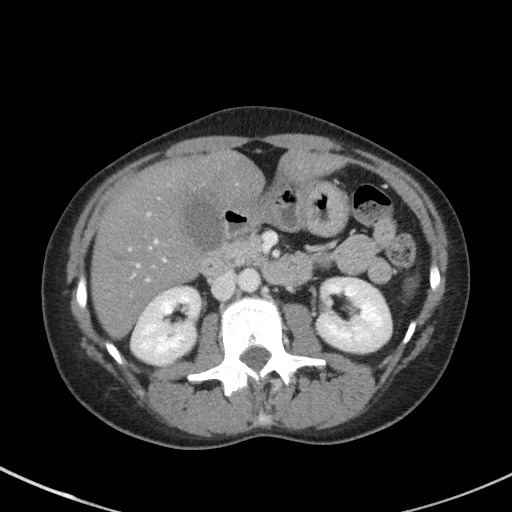
[im 69/87  soft-tissue]
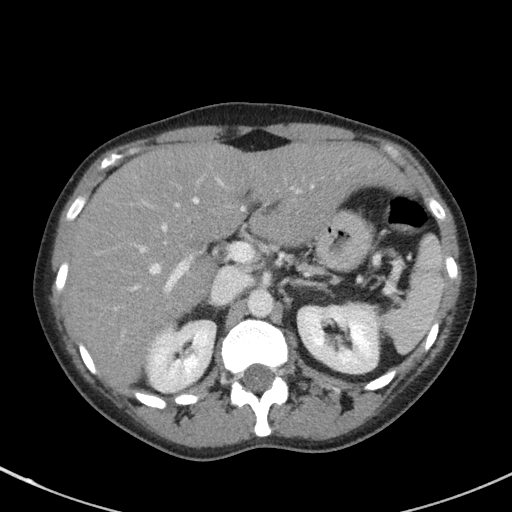
[im 76/87  soft-tissue]
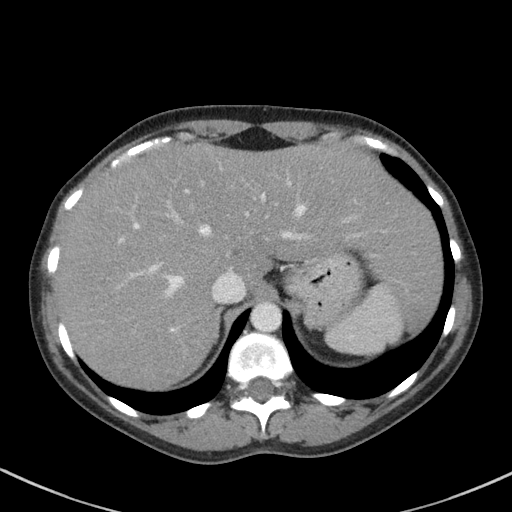
[im 83/87  soft-tissue]
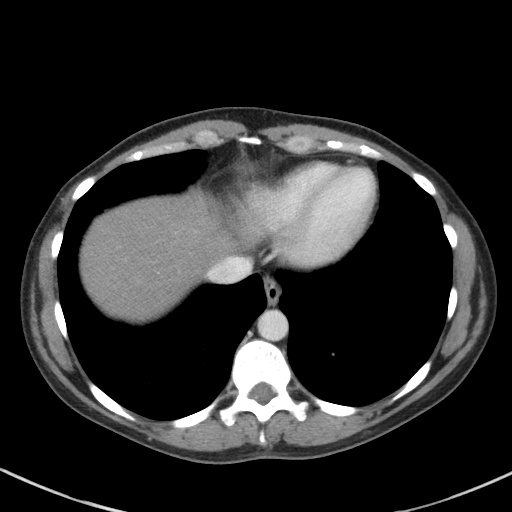

[Series 5: coronal st · coronal · 0.63mm/px · 3 of 79 slices shown]
[im 27/79  soft-tissue]
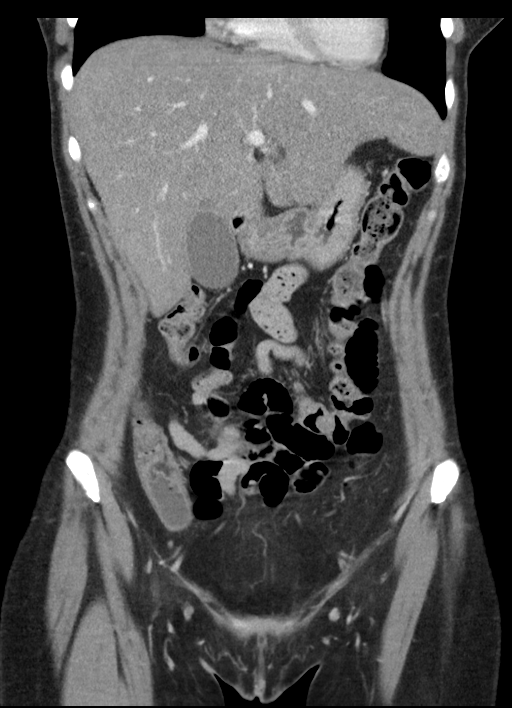
[im 35/79  soft-tissue]
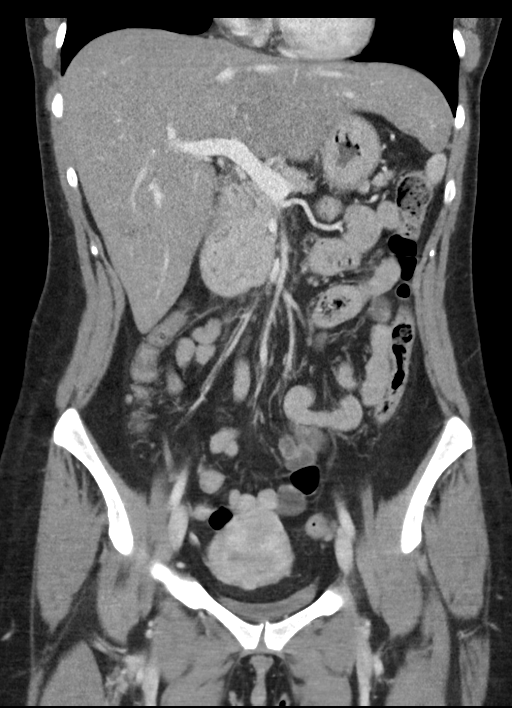
[im 44/79  soft-tissue]
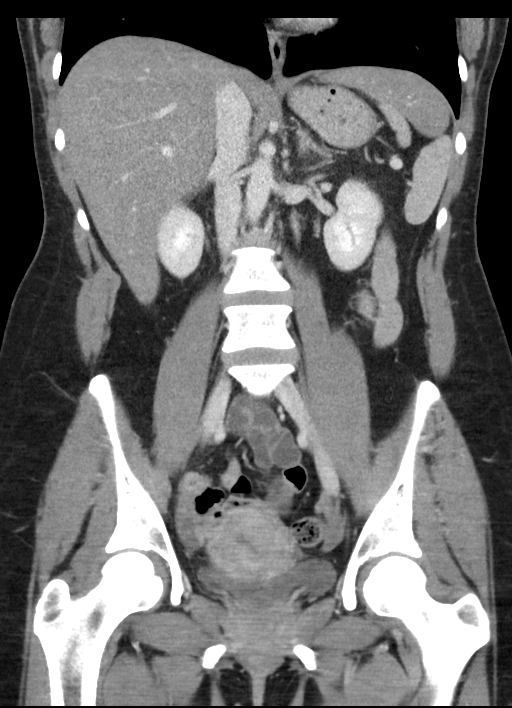

[16 of 46 positions shown; findings below may reference images not displayed]

FINDINGS: Lower chest: Clear lung bases.

Hepatobiliary: Diffuse low density of the liver relative to the
spleen. Focal fat deposition adjacent to the falciform ligament.
Additional several small focal areas of decreased density throughout
the liver. Unremarkable gallbladder.

Pancreas: Unremarkable. No pancreatic ductal dilatation or
surrounding inflammatory changes.

Spleen: Normal in size without focal abnormality.

Adrenals/Urinary Tract: Adrenal glands are unremarkable. Kidneys are
normal, without renal calculi, focal lesion, or hydronephrosis.
Bladder is unremarkable.

Stomach/Bowel: Stomach is within normal limits. Appendix appears
normal. No evidence of bowel wall thickening, distention, or
inflammatory changes.

Vascular/Lymphatic: No significant vascular findings are present. No
enlarged abdominal or pelvic lymph nodes.

Reproductive: Previously demonstrated multiple uterine masses.
Unremarkable ovaries.

Other: No abdominal wall hernia or abnormality. No abdominopelvic
ascites.

Musculoskeletal: Mild lumbar lower thoracic spine degenerative
changes.
IMPRESSION: 1. Multiple small, somewhat linear areas of decreased density
throughout the liver. This appearance can be seen with sclerosing
cholangitis.
2. Diffuse hepatic steatosis.
3. Multiple uterine fibroids.

## 2018-03-03 DIAGNOSIS — F329 Major depressive disorder, single episode, unspecified: Secondary | ICD-10-CM | POA: Diagnosis present

## 2018-03-03 DIAGNOSIS — K2970 Gastritis, unspecified, without bleeding: Secondary | ICD-10-CM | POA: Diagnosis present

## 2018-03-03 DIAGNOSIS — F32A Depression, unspecified: Secondary | ICD-10-CM | POA: Diagnosis present

## 2018-03-03 DIAGNOSIS — D509 Iron deficiency anemia, unspecified: Secondary | ICD-10-CM | POA: Diagnosis present

## 2018-03-22 ENCOUNTER — Inpatient Hospital Stay (HOSPITAL_COMMUNITY): Payer: Self-pay

## 2018-03-22 ENCOUNTER — Inpatient Hospital Stay (HOSPITAL_COMMUNITY)
Admission: EM | Admit: 2018-03-22 | Discharge: 2018-03-25 | DRG: 439 | Disposition: A | Discharge status: Home / Self Care | Payer: Self-pay | Attending: Internal Medicine | Admitting: Internal Medicine

## 2018-03-22 ENCOUNTER — Other Ambulatory Visit: Payer: Self-pay

## 2018-03-22 ENCOUNTER — Encounter (HOSPITAL_COMMUNITY): Payer: Self-pay | Admitting: Radiology

## 2018-03-22 DIAGNOSIS — E875 Hyperkalemia: Secondary | ICD-10-CM | POA: Diagnosis present

## 2018-03-22 DIAGNOSIS — Z91040 Latex allergy status: Secondary | ICD-10-CM

## 2018-03-22 DIAGNOSIS — E872 Acidosis, unspecified: Secondary | ICD-10-CM

## 2018-03-22 DIAGNOSIS — T189XXA Foreign body of alimentary tract, part unspecified, initial encounter: Secondary | ICD-10-CM

## 2018-03-22 DIAGNOSIS — Z79899 Other long term (current) drug therapy: Secondary | ICD-10-CM

## 2018-03-22 DIAGNOSIS — N179 Acute kidney failure, unspecified: Secondary | ICD-10-CM | POA: Diagnosis present

## 2018-03-22 DIAGNOSIS — K760 Fatty (change of) liver, not elsewhere classified: Secondary | ICD-10-CM | POA: Diagnosis present

## 2018-03-22 DIAGNOSIS — K852 Alcohol induced acute pancreatitis without necrosis or infection: Principal | ICD-10-CM | POA: Diagnosis present

## 2018-03-22 DIAGNOSIS — K7010 Alcoholic hepatitis without ascites: Secondary | ICD-10-CM | POA: Diagnosis present

## 2018-03-22 DIAGNOSIS — K859 Acute pancreatitis without necrosis or infection, unspecified: Secondary | ICD-10-CM | POA: Diagnosis present

## 2018-03-22 DIAGNOSIS — Z87891 Personal history of nicotine dependence: Secondary | ICD-10-CM

## 2018-03-22 DIAGNOSIS — F329 Major depressive disorder, single episode, unspecified: Secondary | ICD-10-CM | POA: Diagnosis present

## 2018-03-22 DIAGNOSIS — R7989 Other specified abnormal findings of blood chemistry: Secondary | ICD-10-CM | POA: Diagnosis present

## 2018-03-22 DIAGNOSIS — F1010 Alcohol abuse, uncomplicated: Secondary | ICD-10-CM | POA: Diagnosis present

## 2018-03-22 DIAGNOSIS — E860 Dehydration: Secondary | ICD-10-CM | POA: Diagnosis present

## 2018-03-22 DIAGNOSIS — T189XXD Foreign body of alimentary tract, part unspecified, subsequent encounter: Secondary | ICD-10-CM

## 2018-03-22 DIAGNOSIS — K5100 Ulcerative (chronic) pancolitis without complications: Secondary | ICD-10-CM | POA: Diagnosis present

## 2018-03-22 DIAGNOSIS — D509 Iron deficiency anemia, unspecified: Secondary | ICD-10-CM | POA: Diagnosis present

## 2018-03-22 DIAGNOSIS — R945 Abnormal results of liver function studies: Secondary | ICD-10-CM | POA: Diagnosis present

## 2018-03-22 DIAGNOSIS — E876 Hypokalemia: Secondary | ICD-10-CM | POA: Diagnosis present

## 2018-03-22 DIAGNOSIS — R9431 Abnormal electrocardiogram [ECG] [EKG]: Secondary | ICD-10-CM | POA: Diagnosis present

## 2018-03-22 DIAGNOSIS — R109 Unspecified abdominal pain: Secondary | ICD-10-CM

## 2018-03-22 LAB — URINALYSIS, ROUTINE W REFLEX MICROSCOPIC
BACTERIA UA: NONE SEEN
Bilirubin Urine: NEGATIVE
GLUCOSE, UA: NEGATIVE mg/dL
Ketones, ur: 80 mg/dL — AB
Leukocytes, UA: NEGATIVE
NITRITE: NEGATIVE
PROTEIN: 100 mg/dL — AB
Specific Gravity, Urine: 1.018 (ref 1.005–1.030)
pH: 5 (ref 5.0–8.0)

## 2018-03-22 LAB — BLOOD GAS, VENOUS
ACID-BASE DEFICIT: 15.7 mmol/L — AB (ref 0.0–2.0)
Acid-Base Excess: 11.9 mmol/L — ABNORMAL HIGH (ref 0.0–2.0)
Acid-base deficit: 19.1 mmol/L — ABNORMAL HIGH (ref 0.0–2.0)
BICARBONATE: 10.7 mmol/L — AB (ref 20.0–28.0)
Bicarbonate: 8.3 mmol/L — ABNORMAL LOW (ref 20.0–28.0)
Bicarbonate: 37.4 mmol/L — ABNORMAL HIGH (ref 20.0–28.0)
Drawn by: 33147
O2 SAT: 55 %
O2 Saturation: 47.5 %
O2 Saturation: 45.1 %
PCO2 VEN: 24 mmHg — AB (ref 44.0–60.0)
PCO2 VEN: 27.9 mmHg — AB (ref 44.0–60.0)
PH VEN: 7.163 — AB (ref 7.250–7.430)
PO2 VEN: 40.8 mmHg (ref 32.0–45.0)
Patient temperature: 98.2
Patient temperature: 98.6
Patient temperature: 98.6
pCO2, Ven: 56.6 mmHg (ref 44.0–60.0)
pH, Ven: 7.208 — ABNORMAL LOW (ref 7.250–7.430)
pH, Ven: 7.436 — ABNORMAL HIGH (ref 7.250–7.430)
pO2, Ven: 35.4 mmHg (ref 32.0–45.0)

## 2018-03-22 LAB — COMPREHENSIVE METABOLIC PANEL
ALT: 151 U/L — AB (ref 0–44)
AST: 314 U/L — AB (ref 15–41)
Albumin: 4.7 g/dL (ref 3.5–5.0)
Alkaline Phosphatase: 77 U/L (ref 38–126)
Anion gap: 33 — ABNORMAL HIGH (ref 5–15)
BUN: 12 mg/dL (ref 6–20)
CHLORIDE: 98 mmol/L (ref 98–111)
CO2: 8 mmol/L — AB (ref 22–32)
CREATININE: 1.55 mg/dL — AB (ref 0.44–1.00)
Calcium: 9.3 mg/dL (ref 8.9–10.3)
GFR calc Af Amer: 49 mL/min — ABNORMAL LOW (ref >60)
GFR calc non Af Amer: 42 mL/min — ABNORMAL LOW (ref >60)
GLUCOSE: 126 mg/dL — AB (ref 70–99)
Potassium: 5.2 mmol/L — ABNORMAL HIGH (ref 3.5–5.1)
SODIUM: 139 mmol/L (ref 135–145)
Total Bilirubin: 3.4 mg/dL — ABNORMAL HIGH (ref 0.3–1.2)
Total Protein: 8.5 g/dL — ABNORMAL HIGH (ref 6.5–8.1)

## 2018-03-22 LAB — CBC WITH DIFFERENTIAL/PLATELET
Abs Immature Granulocytes: 0.11 10*3/uL — ABNORMAL HIGH (ref 0.00–0.07)
BASOS ABS: 0.1 10*3/uL (ref 0.0–0.1)
Basophils Relative: 1 %
EOS PCT: 0 %
Eosinophils Absolute: 0.1 10*3/uL (ref 0.0–0.5)
HCT: 41.9 % (ref 36.0–46.0)
Hemoglobin: 11.9 g/dL — ABNORMAL LOW (ref 12.0–15.0)
IMMATURE GRANULOCYTES: 1 %
LYMPHS ABS: 0.6 10*3/uL — AB (ref 0.7–4.0)
LYMPHS PCT: 4 %
MCH: 26 pg (ref 26.0–34.0)
MCHC: 28.4 g/dL — ABNORMAL LOW (ref 30.0–36.0)
MCV: 91.7 fL (ref 80.0–100.0)
Monocytes Absolute: 1.9 10*3/uL — ABNORMAL HIGH (ref 0.1–1.0)
Monocytes Relative: 13 %
NRBC: 0 % (ref 0.0–0.2)
Neutro Abs: 11.3 10*3/uL — ABNORMAL HIGH (ref 1.7–7.7)
Neutrophils Relative %: 81 %
PLATELETS: 390 10*3/uL (ref 150–400)
RBC: 4.57 MIL/uL (ref 3.87–5.11)
RDW: 23.3 % — ABNORMAL HIGH (ref 11.5–15.5)
WBC: 14 10*3/uL — ABNORMAL HIGH (ref 4.0–10.5)

## 2018-03-22 LAB — I-STAT CG4 LACTIC ACID, ED
LACTIC ACID, VENOUS: 6.23 mmol/L — AB (ref 0.5–1.9)
LACTIC ACID, VENOUS: 3.42 mmol/L — AB (ref 0.5–1.9)
Lactic Acid, Venous: 1.84 mmol/L (ref 0.5–1.9)
Lactic Acid, Venous: 1.2 mmol/L (ref 0.5–1.9)

## 2018-03-22 LAB — SALICYLATE LEVEL

## 2018-03-22 LAB — ETHANOL

## 2018-03-22 LAB — I-STAT TROPONIN, ED: Troponin i, poc: 0 ng/mL (ref 0.00–0.08)

## 2018-03-22 LAB — RAPID URINE DRUG SCREEN, HOSP PERFORMED
AMPHETAMINES: NOT DETECTED
Barbiturates: NOT DETECTED
Benzodiazepines: NOT DETECTED
Cocaine: NOT DETECTED
Opiates: POSITIVE — AB
Tetrahydrocannabinol: NOT DETECTED

## 2018-03-22 LAB — TRIGLYCERIDES: Triglycerides: 183 mg/dL — ABNORMAL HIGH (ref <150)

## 2018-03-22 LAB — ACETAMINOPHEN LEVEL

## 2018-03-22 LAB — POTASSIUM: Potassium: 4.4 mmol/L (ref 3.5–5.1)

## 2018-03-22 LAB — PREGNANCY, URINE: Preg Test, Ur: NEGATIVE

## 2018-03-22 LAB — MRSA PCR SCREENING: MRSA by PCR: NEGATIVE

## 2018-03-22 LAB — LIPASE, BLOOD: Lipase: 814 U/L — ABNORMAL HIGH (ref 11–51)

## 2018-03-22 MED ORDER — HYDROMORPHONE HCL 1 MG/ML IJ SOLN
1.0000 mg | INTRAMUSCULAR | Status: DC | PRN
  Administered 2018-03-22 – 2018-03-23 (×6): 1 mg via INTRAVENOUS
  Filled 2018-03-22 (×6): qty 1

## 2018-03-22 MED ORDER — LORAZEPAM 2 MG/ML IJ SOLN: 0.0000 mg | Freq: Two times a day (BID) | INTRAMUSCULAR | Status: DC

## 2018-03-22 MED ORDER — THIAMINE HCL 100 MG/ML IJ SOLN: 100.0000 mg | Freq: Every day | INTRAMUSCULAR | Status: DC

## 2018-03-22 MED ORDER — MORPHINE SULFATE (PF) 4 MG/ML IV SOLN
4.0000 mg | Freq: Once | INTRAVENOUS | Status: AC
  Administered 2018-03-22: 4 mg via INTRAVENOUS
  Filled 2018-03-22: qty 1

## 2018-03-22 MED ORDER — STERILE WATER FOR INJECTION IV SOLN
INTRAVENOUS | Status: AC
  Administered 2018-03-22: 10:00:00 via INTRAVENOUS
  Filled 2018-03-22: qty 850

## 2018-03-22 MED ORDER — MAGNESIUM SULFATE 2 GM/50ML IV SOLN: 2.0000 g | INTRAVENOUS | Status: DC

## 2018-03-22 MED ORDER — LORAZEPAM 1 MG PO TABS: 0.0000 mg | ORAL_TABLET | Freq: Two times a day (BID) | ORAL | Status: DC

## 2018-03-22 MED ORDER — VITAMIN B-1 100 MG PO TABS: 100.0000 mg | ORAL_TABLET | Freq: Every day | ORAL | Status: DC

## 2018-03-22 MED ORDER — THIAMINE HCL 100 MG/ML IJ SOLN
100.0000 mg | Freq: Every day | INTRAMUSCULAR | Status: DC
  Administered 2018-03-22 – 2018-03-25 (×4): 100 mg via INTRAVENOUS
  Filled 2018-03-22 (×4): qty 2

## 2018-03-22 MED ORDER — LACTATED RINGERS IV SOLN
INTRAVENOUS | Status: AC
  Administered 2018-03-22 – 2018-03-23 (×3): via INTRAVENOUS

## 2018-03-22 MED ORDER — LORAZEPAM 2 MG/ML IJ SOLN: 0.0000 mg | Freq: Four times a day (QID) | INTRAMUSCULAR | Status: DC

## 2018-03-22 MED ORDER — HYDROMORPHONE HCL 1 MG/ML IJ SOLN
1.0000 mg | Freq: Once | INTRAMUSCULAR | Status: AC
  Administered 2018-03-22: 1 mg via INTRAVENOUS
  Filled 2018-03-22: qty 1

## 2018-03-22 MED ORDER — ONDANSETRON HCL 4 MG/2ML IJ SOLN
4.0000 mg | Freq: Once | INTRAMUSCULAR | Status: AC
  Administered 2018-03-22: 4 mg via INTRAVENOUS
  Filled 2018-03-22: qty 2

## 2018-03-22 MED ORDER — THIAMINE HCL 100 MG/ML IJ SOLN
Freq: Once | INTRAVENOUS | Status: AC
  Administered 2018-03-22: 07:00:00 via INTRAVENOUS
  Filled 2018-03-22: qty 1000

## 2018-03-22 MED ORDER — SODIUM CHLORIDE 0.9 % IV BOLUS
1000.0000 mL | Freq: Once | INTRAVENOUS | Status: AC
  Administered 2018-03-22: 1000 mL via INTRAVENOUS

## 2018-03-22 MED ORDER — ONDANSETRON HCL 4 MG/2ML IJ SOLN
4.0000 mg | Freq: Four times a day (QID) | INTRAMUSCULAR | Status: DC | PRN
  Administered 2018-03-22 – 2018-03-23 (×4): 4 mg via INTRAVENOUS
  Filled 2018-03-22 (×4): qty 2

## 2018-03-22 MED ORDER — ORAL CARE MOUTH RINSE
15.0000 mL | Freq: Two times a day (BID) | OROMUCOSAL | Status: DC
  Administered 2018-03-22 – 2018-03-23 (×2): 15 mL via OROMUCOSAL

## 2018-03-22 MED ORDER — LORAZEPAM 1 MG PO TABS: 0.0000 mg | ORAL_TABLET | Freq: Four times a day (QID) | ORAL | Status: DC

## 2018-03-22 MED ORDER — LIP MEDEX EX OINT
TOPICAL_OINTMENT | CUTANEOUS | Status: AC
  Administered 2018-03-22: 21:00:00
  Filled 2018-03-22: qty 7

## 2018-03-22 NOTE — ED Notes (Signed)
ED TO INPATIENT HANDOFF REPORT  Name/Age/Gender Susan [name] 37 y.o. female  Code Status    Code Status Orders  (From admission, onward)         Start     Ordered   03/22/18 0514  Full code  Continuous     03/22/18 0514        Code Status History    Date Active Date Inactive Code Status Order ID Comments User Context   03/31/2017 0102 04/02/2017 1819 Full Code [id number]  Ivor Costa, MD ED   03/30/2017 2127 03/31/2017 0102 Full Code [id number]  Lorin Glass, PA-C ED   09/05/2016 0305 09/09/2016 1644 Full Code [id number]  Norval Morton, MD ED   09/04/2016 2330 09/05/2016 0305 Full Code [id number]  Margarita Mail, PA-C ED   06/04/2016 1718 06/05/2016 1804 Full Code [id number]  Kerney Elbe, DO Inpatient      Home/SNF/Other Home  Chief Complaint N/V  Level of Care/Admitting Diagnosis ED Disposition    ED Disposition Condition [facility] Area: [place] [100102]  Level of Care: Stepdown [14]  Admit to SDU based on following criteria: Hemodynamic compromise or significant risk of instability:  Patient requiring short term acute titration and management of vasoactive drips, and invasive monitoring (i.e., CVP and Arterial line).  Diagnosis: Acute pancreatitis [577.0.ICD-9-CM]  Admitting Physician: Elodia Florence [[id number]]  Attending Physician: Cephus Slater, A CALDWELL [phone]  Estimated length of stay: past midnight tomorrow  Certification:: I certify this patient will need inpatient services for at least 2 midnights  PT Class (Do Not Modify): Inpatient [101]  PT Acc Code (Do Not Modify): Private [1]       Medical History Past Medical History:  Diagnosis Date  . Alcohol abuse   . Depression   . Gastritis   . GIB (gastrointestinal bleeding)   . Iron deficiency anemia   . Pancreatitis     Allergies Allergies  Allergen Reactions  . Latex Rash    Tape irritates the skin    IV  Location/Drains/Wounds Patient Lines/Drains/Airways Status   Active Line/Drains/Airways    Name:   Placement date:   Placement time:   Site:   Days:   Peripheral IV 03/22/18 Right Antecubital   03/22/18    0354    Antecubital   less than 1          Labs/Imaging Results for orders placed or performed during the hospital encounter of 03/22/18 (from the past 48 hour(s))  CBC with Differential/Platelet     Status: Abnormal   Collection Time: 03/22/18  3:31 AM  Result Value Ref Range   WBC 14.0 (H) 4.0 - 10.5 K/uL   RBC 4.57 3.87 - 5.11 MIL/uL   Hemoglobin 11.9 (L) 12.0 - 15.0 g/dL   HCT 41.9 36.0 - 46.0 %   MCV 91.7 80.0 - 100.0 fL   MCH 26.0 26.0 - 34.0 pg   MCHC 28.4 (L) 30.0 - 36.0 g/dL   RDW 23.3 (H) 11.5 - 15.5 %   Platelets 390 150 - 400 K/uL   nRBC 0.0 0.0 - 0.2 %   Neutrophils Relative % 81 %   Neutro Abs 11.3 (H) 1.7 - 7.7 K/uL   Lymphocytes Relative 4 %   Lymphs Abs 0.6 (L) 0.7 - 4.0 K/uL   Monocytes Relative 13 %   Monocytes Absolute 1.9 (H) 0.1 - 1.0 K/uL   Eosinophils Relative 0 %   Eosinophils Absolute 0.1  0.0 - 0.5 K/uL   Basophils Relative 1 %   Basophils Absolute 0.1 0.0 - 0.1 K/uL   Immature Granulocytes 1 %   Abs Immature Granulocytes 0.11 (H) 0.00 - 0.07 K/uL   Tear Drop Cells PRESENT    Burr Cells PRESENT     Comment: Performed at [facility], [place] [address], [name], [place] [address]  Comprehensive metabolic panel     Status: Abnormal   Collection Time: 03/22/18  3:31 AM  Result Value Ref Range   Sodium 139 135 - 145 mmol/L   Potassium 5.2 (H) 3.5 - 5.1 mmol/L    Comment: SLIGHT HEMOLYSIS   Chloride 98 98 - 111 mmol/L   CO2 8 (L) 22 - 32 mmol/L   Glucose, Bld 126 (H) 70 - 99 mg/dL   BUN 12 6 - 20 mg/dL   Creatinine, Ser 1.55 (H) 0.44 - 1.00 mg/dL   Calcium 9.3 8.9 - 10.3 mg/dL   Total Protein 8.5 (H) 6.5 - 8.1 g/dL   Albumin 4.7 3.5 - 5.0 g/dL   AST 314 (H) 15 - 41 U/L    Comment: RESULTS CONFIRMED BY MANUAL DILUTION   ALT  151 (H) 0 - 44 U/L   Alkaline Phosphatase 77 38 - 126 U/L   Total Bilirubin 3.4 (H) 0.3 - 1.2 mg/dL   GFR calc non Af Amer 42 (L) >60 mL/min   GFR calc Af Amer 49 (L) >60 mL/min    Comment: (NOTE) The eGFR has been calculated using the CKD EPI equation. This calculation has not been validated in all clinical situations. eGFR's persistently <60 mL/min signify possible Chronic Kidney Disease.    Anion gap 33 (H) 5 - 15    Comment: REPEATED TO VERIFY Performed at [facility], [place] [address] [place], [place] [address]   Lipase, blood     Status: Abnormal   Collection Time: 03/22/18  3:31 AM  Result Value Ref Range   Lipase 814 (H) 11 - 51 U/L    Comment: RESULTS CONFIRMED BY MANUAL DILUTION Performed at [facility], [place] [address] [place], [place] [address]   Ethanol     Status: None   Collection Time: 03/22/18  3:31 AM  Result Value Ref Range   Alcohol, Ethyl (B) <10 <10 mg/dL    Comment: (NOTE) Lowest detectable limit for serum alcohol is 10 mg/dL. For medical purposes only. Performed at [facility], [place] [address] [place],  [address]   I-Stat Troponin, ED (not at [facility])     Status: None   Collection Time: 03/22/18  3:53 AM  Result Value Ref Range   Troponin i, poc 0.00 0.00 - 0.08 ng/mL   Comment 3            Comment: Due to the release kinetics of cTnI, a negative result within the first hours of the onset of symptoms does not rule out myocardial infarction with certainty. If myocardial infarction is still suspected, repeat the test at appropriate intervals.   I-Stat CG4 Lactic Acid, ED     Status: Abnormal   Collection Time: 03/22/18  3:54 AM  Result Value Ref Range   Lactic Acid, Venous 6.23 (HH) 0.5 - 1.9 mmol/L   Comment NOTIFIED PHYSICIAN   Blood gas, venous     Status: Abnormal   Collection Time: 03/22/18  4:40 AM  Result Value Ref Range   pH, Ven 7.163 (LL) 7.250 - 7.430  Comment:  CRITICAL RESULT CALLED TO, READ BACK BY AND VERIFIED WITH:  R. BROWNING, PA BY KELLY JARRELL, RRT, RCP AT 0450 ON 03/22/2018    pCO2, Ven 24.0 (L) 44.0 - 60.0 mmHg   pO2, Ven 40.8 32.0 - 45.0 mmHg   Bicarbonate 8.3 (L) 20.0 - 28.0 mmol/L   Acid-base deficit 19.1 (H) 0.0 - 2.0 mmol/L   O2 Saturation 55.0 %   Patient temperature 98.2    Collection site VEIN    Drawn by DRAWN BY RN    Sample type VEIN     Comment: Performed at [facility], [place] [address] [place], [place] [address]  Rapid urine drug screen (hospital performed)     Status: Abnormal   Collection Time: 03/22/18  5:30 AM  Result Value Ref Range   Opiates POSITIVE (A) NONE DETECTED   Cocaine NONE DETECTED NONE DETECTED   Benzodiazepines NONE DETECTED NONE DETECTED   Amphetamines NONE DETECTED NONE DETECTED   Tetrahydrocannabinol NONE DETECTED NONE DETECTED   Barbiturates NONE DETECTED NONE DETECTED    Comment: (NOTE) DRUG SCREEN FOR MEDICAL PURPOSES ONLY.  IF CONFIRMATION IS NEEDED FOR ANY PURPOSE, NOTIFY LAB WITHIN 5 DAYS. LOWEST DETECTABLE LIMITS FOR URINE DRUG SCREEN Drug Class                     Cutoff (ng/mL) Amphetamine and metabolites    1000 Barbiturate and metabolites    200 Benzodiazepine                 938 Tricyclics and metabolites     300 Opiates and metabolites        300 Cocaine and metabolites        300 THC                            50 Performed at [facility], [place] [address], [place], [place] [address]   Urinalysis, Routine w reflex microscopic     Status: Abnormal   Collection Time: 03/22/18  5:30 AM  Result Value Ref Range   Color, Urine AMBER (A) YELLOW    Comment: BIOCHEMICALS MAY BE AFFECTED BY COLOR   APPearance CLOUDY (A) CLEAR   Specific Gravity, Urine 1.018 1.005 - 1.030   pH 5.0 5.0 - 8.0   Glucose, UA NEGATIVE NEGATIVE mg/dL   Hgb urine dipstick SMALL (A) NEGATIVE   Bilirubin Urine NEGATIVE NEGATIVE   Ketones, ur 80 (A) NEGATIVE mg/dL    Protein, ur 100 (A) NEGATIVE mg/dL   Nitrite NEGATIVE NEGATIVE   Leukocytes, UA NEGATIVE NEGATIVE   RBC / HPF 6-10 0 - 5 RBC/hpf   WBC, UA 6-10 0 - 5 WBC/hpf   Bacteria, UA NONE SEEN NONE SEEN   Squamous Epithelial / LPF 21-50 0 - 5   Mucus PRESENT    Hyaline Casts, UA PRESENT     Comment: Performed at [facility], [place] [address], [place], [place] [address]  Salicylate level     Status: None   Collection Time: 03/22/18  5:30 AM  Result Value Ref Range   Salicylate Lvl <6.7 2.8 - 30.0 mg/dL    Comment: Performed at [facility], [place] [address] [place], [place] [address]  Pregnancy, urine     Status: None   Collection Time: 03/22/18  5:30 AM  Result Value Ref Range   Preg Test, Ur NEGATIVE NEGATIVE  Comment:        THE SENSITIVITY OF THIS METHODOLOGY IS >20 mIU/mL. Performed at [facility], [place] [address] [place], [place] [address]   Acetaminophen level     Status: Abnormal   Collection Time: 03/22/18  5:30 AM  Result Value Ref Range   Acetaminophen (Tylenol), Serum <10 (L) 10 - 30 ug/mL    Comment: (NOTE) Therapeutic concentrations vary significantly. A range of 10-30 ug/mL  may be an effective concentration for many patients. However, some  are best treated at concentrations outside of this range. Acetaminophen concentrations >150 ug/mL at 4 hours after ingestion  and >50 ug/mL at 12 hours after ingestion are often associated with  toxic reactions. Performed at [facility], [place] [address] [place], [place] [address]   Potassium     Status: None   Collection Time: 03/22/18  5:51 AM  Result Value Ref Range   Potassium 4.4 3.5 - 5.1 mmol/L    Comment: Performed at [facility], [place] [address] [place], [place] [address]  Triglycerides     Status: Abnormal   Collection Time: 03/22/18  5:51 AM  Result Value Ref Range   Triglycerides 183 (H) <150 mg/dL    Comment:  Performed at [facility], [place] [address] [place], [place] [address]  I-Stat CG4 Lactic Acid, ED     Status: Abnormal   Collection Time: 03/22/18  6:03 AM  Result Value Ref Range   Lactic Acid, Venous 3.42 (HH) 0.5 - 1.9 mmol/L   Comment NOTIFIED PHYSICIAN   I-Stat CG4 Lactic Acid, ED     Status: None   Collection Time: 03/22/18  8:01 AM  Result Value Ref Range   Lactic Acid, Venous 1.84 0.5 - 1.9 mmol/L  Blood gas, venous     Status: Abnormal   Collection Time: 03/22/18  8:08 AM  Result Value Ref Range   pH, Ven 7.208 (L) 7.250 - 7.430   pCO2, Ven 27.9 (L) 44.0 - 60.0 mmHg   pO2, Ven 35.4 32.0 - 45.0 mmHg   Bicarbonate 10.7 (L) 20.0 - 28.0 mmol/L   Acid-base deficit 15.7 (H) 0.0 - 2.0 mmol/L   O2 Saturation 47.5 %   Patient temperature 98.6    Collection site VEIN    Drawn by DRAWN BY RN    Sample type VEIN     Comment: Performed at [facility], [place] [address], [place], [place] [address]  I-Stat CG4 Lactic Acid, ED     Status: None   Collection Time: 03/22/18 10:32 AM  Result Value Ref Range   Lactic Acid, Venous 1.20 0.5 - 1.9 mmol/L   Ct Abdomen Wo Contrast  Result Date: 03/22/2018 CLINICAL DATA:  Nausea and vomiting and abdominal pain for several days EXAM: CT ABDOMEN WITHOUT CONTRAST TECHNIQUE: Multidetector CT imaging of the abdomen was performed following the standard protocol without IV contrast. COMPARISON:  Ultrasound from earlier in the same day FINDINGS: Lower chest: No acute abnormality. Bilateral breast implants are noted. Hepatobiliary: Diffuse fatty infiltration of the liver is noted. The gallbladder is within normal limits. Pancreas: Pancreas is well visualized although demonstrates diffuse indistinct borders with some peripancreatic inflammatory change. These changes are consistent with acute pancreatitis. Spleen: Normal in size without focal abnormality. Adrenals/Urinary Tract: Adrenal glands are unremarkable. Kidneys are  normal, without renal calculi, focal lesion, or hydronephrosis. Stomach/Bowel: Some mild inflammatory changes are noted along the pericolic gutters bilaterally consistent with the known pancreatitis.  The pelvis is not evaluated on this image. There is a metallic appearing foreign body identified within a loop of small bowel best seen on image number 58 of series 2. Vascular/Lymphatic: No significant vascular findings are present. No enlarged abdominal or pelvic lymph nodes. Other: No abdominal wall hernia or abnormality. Musculoskeletal: No acute or significant osseous findings. IMPRESSION: Changes consistent with acute pancreatitis with inflammatory changes identified. No focal fluid collection is seen. Metallic foreign body within the loop of small bowel likely related to ingested content. Fatty infiltration of the liver. Electronically Signed   By: Inez Catalina M.D.   On: 03/22/2018 09:37   US Abdomen Limited  Result Date: 03/22/2018 CLINICAL DATA:  Elevated bilirubin EXAM: ULTRASOUND ABDOMEN LIMITED RIGHT UPPER QUADRANT COMPARISON:  CT abdomen 02/28/2017 FINDINGS: Gallbladder: No gallstones or wall thickening visualized. No sonographic Murphy sign noted by sonographer. Common bile duct: Diameter: 2.4 mm Liver: Diffusely echogenic liver parenchyma without focal lesion. Portal vein is patent on color Doppler imaging with normal direction of blood flow towards the liver. Negative for ascites right upper quadrant IMPRESSION: No biliary dilatation Hepatic steatosis Electronically Signed   By: Franchot Gallo M.D.   On: 03/22/2018 07:27   Dg Chest Port 1 View  Result Date: 03/22/2018 CLINICAL DATA:  Central chest pain for 2 days EXAM: PORTABLE CHEST 1 VIEW COMPARISON:  03/30/2017 FINDINGS: The heart size and mediastinal contours are within normal limits. Both lungs are clear. The visualized skeletal structures are unremarkable. Bilateral breast implants are noted. IMPRESSION: No active disease. Electronically  Signed   By: Inez Catalina M.D.   On: 03/22/2018 11:13   EKG Interpretation  Date/Time:  _0 /13/19 0718   Signed and Held  CBC  (heparin)  Once,   R    Comments:  Baseline for heparin therapy IF NOT ALREADY DRAWN.  Notify MD if PLT < 100 K.    Signed and Held   Signed and Held  Creatinine, serum  (heparin)  Once,   R    Comments:  Baseline for heparin therapy IF NOT ALREADY DRAWN.    Signed and Held   Signed and Held  Comprehensive metabolic panel  Tomorrow morning,   R     Signed and Held   Signed and Held  CBC  Tomorrow morning,   R     Signed and Held          Vitals/Pain Today's Vitals   03/22/18 1000  03/22/18 1024 03/22/18 1030 03/22/18 1255  BP: 119/83  125/85 124/84  Pulse: (!) 102  (!) 109 90  Resp: _0 Temp:      TempSrc:      SpO2: 100%  100% 100%  Weight:      Height:      PainSc:  2       Isolation Precautions No active isolations  Medications Medications  LORazepam (ATIVAN) injection 0-4 mg (0 mg Intravenous Not Given 03/22/18 0539)    Or  LORazepam (ATIVAN) tablet 0-4 mg (  Oral See Alternative 03/22/18 0539)  LORazepam (ATIVAN) injection 0-4 mg (has no administration in time range)    Or  LORazepam (ATIVAN) tablet 0-4 mg (has no administration in time range)  HYDROmorphone (DILAUDID) injection 1 mg (1 mg Intravenous Given 03/22/18 0945)  ondansetron (ZOFRAN) injection 4 mg (4 mg Intravenous Given 03/22/18 0945)  sodium bicarbonate 150 mEq in sterile water 1,000 mL infusion ( Intravenous New Bag/Given 03/22/18 0942)  lactated ringers infusion (has no administration in time range)  thiamine (B-1) injection 100 mg (100 mg Intravenous Given 03/22/18 0945)  sodium chloride 0.9 % bolus 1,000 mL (0 mLs Intravenous Stopped 03/22/18 0540)  morphine 4 MG/ML injection 4 mg (4 mg Intravenous Given 03/22/18 0348)  ondansetron (ZOFRAN) injection 4 mg (4 mg Intravenous Given 03/22/18 0348)  sodium chloride 0.9 % bolus 1,000 mL (0 mLs Intravenous Stopped 03/22/18 0648)  HYDROmorphone (DILAUDID) injection 1 mg (1 mg Intravenous Given 03/22/18 0515)  ondansetron (ZOFRAN) injection 4 mg (4 mg Intravenous Given 03/22/18 0515)  sodium chloride 0.9 % bolus 1,000 mL (0 mLs Intravenous Stopped 03/22/18 0708)  dextrose 5 % and 0.9% NaCl with thiamine 263 mg, folic acid 1 mg, multivitamins adult 10 mL, magnesium sulfate 2 g infusion ( Intravenous Stopped 03/22/18 0940)    Mobility walks

## 2018-03-22 NOTE — H&P (Addendum)
History and Physical    Susan [name] YZJ:[id number] DOB: 1980/09/06 DOA: 03/22/2018  PCP: Marlinda Mike, CNM  Patient coming from: home  I have personally briefly reviewed patient's old medical records in [facility]  Chief Complaint: abdominal pain  HPI: Susan [name] is Susan [name] 37 y.o. female with medical history significant of alcohol abuse, depression, GI bleed, pancreatitis presenting with abdominal pain, nausea and vomiting over the past week and half.   Patient notes that she had been drinking on and off for the past 6 months, but had increased stress over the past few weeks and began drinking more.  She was drinking about 5 drinks Susan [name] day.  Since this time, she developed extreme nausea and vomiting multiple times Susan [name] day.  She denies any diarrhea.  She notes pain in her belly, local localized to the epigastric region.  She describes this as sharp and constant.  The sharp pain waxes and wanes.  She notes that this pain has been present for about 2 weeks, but has gotten worse over the past 2 days.  In addition she notes Susan [name] sharp chest discomfort as well over the past week and Susan [name] half.  She describes this is pleuritic.  Her symptoms worsen over the past 2 days.  She denies any fevers cough cold dysuria.  She notes associated back pain.  She denies recent smoking, but does smoke occasionally.  She denies any recent drug use.  ED Course: Labs, IVF, antiemetics, analgesia.  Admit for pancreatitis.  Review of Systems: As per HPI otherwise 10 point review of systems negative.   Past Medical History:  Diagnosis Date  . Alcohol abuse   . Depression   . Gastritis   . GIB (gastrointestinal bleeding)   . Iron deficiency anemia   . Pancreatitis     Past Surgical History:  Procedure Laterality Date  . ESOPHAGOGASTRODUODENOSCOPY (EGD) WITH PROPOFOL N/[name] 06/05/2016   Procedure: ESOPHAGOGASTRODUODENOSCOPY (EGD) WITH PROPOFOL;  Surgeon: Kathi Der, MD;  Location: [facility];  Service:  Gastroenterology;  Laterality: N/Latavion Halls;     reports that she has quit smoking. She has never used smokeless tobacco. She reports that she drinks alcohol. She reports that she does not use drugs.  Allergies  Allergen Reactions  . Latex Rash    Tape irritates the skin    Family History  Problem Relation Age of Onset  . Stroke Mother    Prior to Admission medications   Medication Sig Start Date End Date Taking? Authorizing Provider  Cyanocobalamin (B-12) 1000 MCG TABS Take 1,000 mg by mouth daily.   Yes [provider]  famotidine (PEPCID) 10 MG tablet Take 10 mg by mouth 2 (two) times daily.   Yes [provider]  ferrous sulfate 325 (65 FE) MG tablet Take 325 mg by mouth daily with breakfast.   Yes [provider]  Multiple Vitamins-Minerals (MULTIVITAMIN ADULT) CHEW Chew 1 tablet by mouth daily.   Yes [provider]  venlafaxine XR (EFFEXOR-XR) 37.5 MG 24 hr capsule Take 37.5-75 mg by mouth at bedtime. 03/16/18  Yes [provider]    Physical Exam: Vitals:   03/22/18 0712 03/22/18 0730 03/22/18 0800 03/22/18 0830  BP: (!) 133/91 131/88 (!) 128/97 (!) 126/92  Pulse: 98 (!) 102 (!) 109 (!) 109  Resp: (!) 24 19 17 20   Temp:      TempSrc:      SpO2: 100% 100% 100% 100%  Weight:      Height:  Constitutional: NAD, calm, uncomfortable Vitals:   03/22/18 0712 03/22/18 0730 03/22/18 0800 03/22/18 0830  BP: (!) 133/91 131/88 (!) 128/97 (!) 126/92  Pulse: 98 (!) 102 (!) 109 (!) 109  Resp: (!) 24 19 17 20   Temp:      TempSrc:      SpO2: 100% 100% 100% 100%  Weight:      Height:       Eyes: PERRL, lids and conjunctivae normal ENMT: Mucous membranes are moist. Posterior pharynx clear of any exudate or lesions.Normal dentition.  Neck: normal, supple, no masses, no thyromegaly Respiratory: clear to auscultation bilaterally, no wheezing, no crackles. Cardiovascular: tachycardic rate and rhythm, no murmurs / rubs / gallops. No  extremity edema. 2+ pedal pulses. No carotid bruits.  Abdomen: diffuse TTP, most in epigastric and RUQ (pressing over epigastric region near sternum approximates her CP), no masses palpated. No hepatosplenomegaly. Bowel sounds positive.  Musculoskeletal: no clubbing / cyanosis. No joint deformity upper and lower extremities. Good ROM, no contractures. Normal muscle tone.  Skin: no rashes, lesions, ulcers. No induration Neurologic: CN 2-12 grossly intact. Sensation intact. Strength 5/5 in all 4.  Psychiatric: Normal judgment and insight. Alert and oriented x 3. Normal mood.   Labs on Admission: I have personally reviewed following labs and imaging studies  CBC: Recent Labs  Lab 03/22/18 0331  WBC 14.0*  NEUTROABS 11.3*  HGB 11.9*  HCT 41.9  MCV 91.7  PLT 390   Basic Metabolic Panel: Recent Labs  Lab 03/22/18 0331 03/22/18 0551  NA 139  --   K 5.2* 4.4  CL 98  --   CO2 8*  --   GLUCOSE 126*  --   BUN 12  --   CREATININE 1.55*  --   CALCIUM 9.3  --    GFR: Estimated Creatinine Clearance: 44.1 mL/min (Susan [name]) (by C-G formula based on SCr of 1.55 mg/dL (H)). Liver Function Tests: Recent Labs  Lab 03/22/18 0331  AST 314*  ALT 151*  ALKPHOS 77  BILITOT 3.4*  PROT 8.5*  ALBUMIN 4.7   Recent Labs  Lab 03/22/18 0331  LIPASE 814*   No results for input(s): AMMONIA in the last 168 hours. Coagulation Profile: No results for input(s): INR, PROTIME in the last 168 hours. Cardiac Enzymes: No results for input(s): CKTOTAL, CKMB, CKMBINDEX, TROPONINI in the last 168 hours. BNP (last 3 results) No results for input(s): PROBNP in the last 8760 hours. HbA1C: No results for input(s): HGBA1C in the last 72 hours. CBG: No results for input(s): GLUCAP in the last 168 hours. Lipid Profile: No results for input(s): CHOL, HDL, LDLCALC, TRIG, CHOLHDL, LDLDIRECT in the last 72 hours. Thyroid Function Tests: No results for input(s): TSH, T4TOTAL, FREET4, T3FREE, THYROIDAB in the last 72  hours. Anemia Panel: No results for input(s): VITAMINB12, FOLATE, FERRITIN, TIBC, IRON, RETICCTPCT in the last 72 hours. Urine analysis:    Component Value Date/Time   COLORURINE AMBER (Launi Asencio) 03/22/2018 0530   APPEARANCEUR CLOUDY (Cecilee Rosner) 03/22/2018 0530   LABSPEC 1.018 03/22/2018 0530   PHURINE 5.0 03/22/2018 0530   GLUCOSEU NEGATIVE 03/22/2018 0530   HGBUR SMALL (Saquoia Sianez) 03/22/2018 0530   BILIRUBINUR NEGATIVE 03/22/2018 0530   KETONESUR 80 (Robbi Scurlock) 03/22/2018 0530   PROTEINUR 100 (Keasia Dubose) 03/22/2018 0530   NITRITE NEGATIVE 03/22/2018 0530   LEUKOCYTESUR NEGATIVE 03/22/2018 0530    Radiological Exams on Admission: Ct Abdomen Wo Contrast  Result Date: 03/22/2018 CLINICAL DATA:  Nausea and vomiting and abdominal pain for several days EXAM:  CT ABDOMEN WITHOUT CONTRAST TECHNIQUE: Multidetector CT imaging of the abdomen was performed following the standard protocol without IV contrast. COMPARISON:  Ultrasound from earlier in the same day FINDINGS: Lower chest: No acute abnormality. Bilateral breast implants are noted. Hepatobiliary: Diffuse fatty infiltration of the liver is noted. The gallbladder is within normal limits. Pancreas: Pancreas is well visualized although demonstrates diffuse indistinct borders with some peripancreatic inflammatory change. These changes are consistent with acute pancreatitis. Spleen: Normal in size without focal abnormality. Adrenals/Urinary Tract: Adrenal glands are unremarkable. Kidneys are normal, without renal calculi, focal lesion, or hydronephrosis. Stomach/Bowel: Some mild inflammatory changes are noted along the pericolic gutters bilaterally consistent with the known pancreatitis. The pelvis is not evaluated on this image. There is Lola Lofaro metallic appearing foreign body identified within Jamisen Duerson loop of small bowel best seen on image number 58 of series 2. Vascular/Lymphatic: No significant vascular findings are present. No enlarged abdominal or pelvic lymph nodes. Other: No abdominal wall  hernia or abnormality. Musculoskeletal: No acute or significant osseous findings. IMPRESSION: Changes consistent with acute pancreatitis with inflammatory changes identified. No focal fluid collection is seen. Metallic foreign body within the loop of small bowel likely related to ingested content. Fatty infiltration of the liver. Electronically Signed   By: Alcide Clever M.D.   On: 03/22/2018 09:37   US Abdomen Limited  Result Date: 03/22/2018 CLINICAL DATA:  Elevated bilirubin EXAM: ULTRASOUND ABDOMEN LIMITED RIGHT UPPER QUADRANT COMPARISON:  CT abdomen 02/28/2017 FINDINGS: Gallbladder: No gallstones or wall thickening visualized. No sonographic Murphy sign noted by sonographer. Common bile duct: Diameter: 2.4 mm Liver: Diffusely echogenic liver parenchyma without focal lesion. Portal vein is patent on color Doppler imaging with normal direction of blood flow towards the liver. Negative for ascites right upper quadrant IMPRESSION: No biliary dilatation Hepatic steatosis Electronically Signed   By: Marlan Palau M.D.   On: 03/22/2018 07:27    EKG: Independently reviewed. Sinus tachycardia. RAD.  T wave inversions in inferior leads.  V1-V4 T wave inversions (old, but new in V4).  Assessment/Plan Active Problems:   Acute pancreatitis  Alcoholic Pancreatitis:   Lipase elevated to ~800's.  Also elevated AST>ALT and T bili of 3.4.  RUQ [place] without gallstones or biliary dilatation.  Occurred in setting of drinking 5 drinks daily over the last 1-2 weeks.  She's had similar episode about 1 year ago. S/p 3 L NS in ED Given metabolic acidosis, will use bicarb for resuscitation initially then transition to LR Follow triglycerides Follow I/O, daily weights NPO Pain control, antiemetics Follow CT abdomen/pelvis  Anion Gap Metabolic Acidosis  Acidemia  Lactic Acidosis:  Pt with AGMA in setting of lactic acidosis.  Initial bicarb 8.  Initial VBG with pH of 7.163, improved now to 7.208.  Likely 2/2 lactic  acidosis as well as ketosis.  Negative etoh.  Will check acetone/ethanol/isoprop/methanol.   Start with bicarb, then transition to LR Follow repeat VBG this afternoon  Acute Kidney Injury: 2/2 dehydration, follow with IVF  Elevated Liver Enzymes: Elevated AST, ALT, and bili.  No obstruction seen on RUQ [place].  2/2 recent etoh use Follow acute hepatitis panel, APAP level, salicylates Follow  Alcohol Abuse: CIWA with scheduled and prn ativan.  Thiamine IV daily.  Add PO MVI/folic acid once able to take PO. Encourage cessation  Leukocytosis: likely reactive in the setting of above  Chest Pain: likely 2/2 pancreatitis above, present over the past week and reproducible on exam.  Negative troponin on presentation (will not trend  at this point given pain over past week unless new symptoms).  Shortness of breath with exertion:  She notes deconditioning that has been present since hospitalization last year, but became worse over past week.  She's currently satting well on RA and her tachycardia has improved after IVF.  In setting of acidemia and pancreatitis above, suspect this maybe due to her significant acidemia and metabolic demands as well as possible splinting from pain from pancreatitis.  Continue to monitor with treatment.  Follow and consider additional work up as needed.  Abnormal EKG:  Initial EKG with sinus tachycardia with RAD as well as T wave inversions in inferior leads, that appears different when compared to prior EKGs (has T wave inversion in V4 which appears new and V1-V3 which are old).  Will follow up repeat in AM.    Hyperkalemia: mild, improved on repeat  Hepatic Steatosis: likely 2/2 etoh, follow outpatient  Metallic Foreign Body: Located within loop of small bowel.  Pt does not know of any ingestion.  Surgery c/s.  Abnormal UA: with protein and RBC's, follow up repeat outpatient  Depression  Anxiety: holding antidepressant, no SI (though she did have this about 6 months  ago).  Follow.  Lots of stress with divorce recently.   DVT prophylaxis: heparin Code Status: full  Family Communication: none at bedside Disposition Plan: pending improvement  Consults called: none  Admission status: stepdown.  Pt requires inpatient treatment given her acute pancreatitis with severe metabolic acidosis which will require greater than 2 midnights for treatment due to risk for decompensation and need for additional evaluation and treatment.    Lacretia Nicks MD [facility] Pager [phone]  If 7PM-7AM, please contact night-coverage [email or web address] Password [email or web address]  03/22/2018, 9:46 AM

## 2018-03-22 NOTE — Progress Notes (Signed)
RN aware of VBG pending.

## 2018-03-22 NOTE — ED Provider Notes (Signed)
[facility]-EMERGENCY DEPT Provider Note   CSN: [id number] Arrival date & time: 03/22/18  2395     History   Chief Complaint No chief complaint on file.   HPI Susan [name] is a 37 y.o. female.  Patient presents to the emergency department with a chief complaint of nausea and vomiting and abdominal pain.  She also complains of chest pain shortness of breath.  She states that her symptoms started 4 days ago and have progressively worsened.  She states that she has had severe bouts of pancreatitis and has had a severe metabolic acidosis requiring multiple admissions in the past.  She denies any fever, but states that she does have severe epigastric abdominal pain.  She denies any productive cough.  States that the pain does radiate to her back.  She endorses a drinking problem, and has been drinking heavily due to life stressors.  The history is provided by the patient. No language interpreter was used.    Past Medical History:  Diagnosis Date  . Alcohol abuse   . Depression   . Gastritis   . GIB (gastrointestinal bleeding)   . Iron deficiency anemia   . Pancreatitis     Patient Active Problem List   Diagnosis Date Noted  . Alcohol withdrawal (HCC) 03/31/2017  . Hypomagnesemia 03/31/2017  . Abnormal LFTs 03/31/2017  . Metabolic acidosis 03/31/2017  . Abdominal pain 03/31/2017  . Gastritis   . Depression   . Iron deficiency anemia   . GI bleed 09/05/2016  . Nausea & vomiting 06/04/2016  . Sepsis (HCC) 06/04/2016  . Hyperglycemia 06/04/2016  . DTs (delirium tremens) (HCC) 06/04/2016    Past Surgical History:  Procedure Laterality Date  . ESOPHAGOGASTRODUODENOSCOPY (EGD) WITH PROPOFOL N/A 06/05/2016   Procedure: ESOPHAGOGASTRODUODENOSCOPY (EGD) WITH PROPOFOL;  Surgeon: Kathi Der, MD;  Location: [facility];  Service: Gastroenterology;  Laterality: N/A;     OB History   None      Home Medications    Prior to Admission  medications   Medication Sig Start Date End Date Taking? Authorizing Provider  acetaminophen (TYLENOL) 325 MG tablet Take 1 tablet (325 mg total) by mouth every 6 (six) hours as needed for mild pain, fever or headache. Patient taking differently: Take 650 mg by mouth every 6 (six) hours as needed for mild pain, fever or headache.  09/09/16   Lonia Blood, MD  cephALEXin (KEFLEX) 500 MG capsule Take 1 capsule (500 mg total) by mouth every 12 (twelve) hours. 04/02/17   Dorothea Ogle, MD  Cyanocobalamin (B-12 PO) Take 1 tablet by mouth daily.    [provider]  dicyclomine (BENTYL) 20 MG tablet Take 1 tablet (20 mg total) by mouth 2 (two) times daily. Patient not taking: Reported on 03/31/2017 01/27/17   Alvira Monday, MD  ferrous sulfate 325 (65 FE) MG tablet Take 325 mg by mouth daily with breakfast.    [provider]  FLUoxetine (PROZAC) 10 MG capsule Take 1 capsule (10 mg total) by mouth at bedtime. 04/02/17   Dorothea Ogle, MD  hydrOXYzine (ATARAX/VISTARIL) 25 MG tablet Take 1 tablet (25 mg total) by mouth every 6 (six) hours. Patient taking differently: Take 25 mg by mouth every 6 (six) hours as needed for anxiety.  02/28/17   Rolland Porter, MD  magnesium oxide (MAG-OX) 400 MG tablet TAKE 1 DAILY TABLET WITH IRON 01/24/17   [provider]  metoCLOPramide (REGLAN) 10 MG tablet Take 1 tablet (10 mg  total) by mouth every 6 (six) hours. Patient taking differently: Take 10 mg by mouth every 6 (six) hours as needed for nausea.  02/28/17   Rolland Porter, MD  Multiple Vitamin (MULTIVITAMIN WITH MINERALS) TABS tablet Take 2 tablets by mouth daily.    [provider]  pantoprazole (PROTONIX) 40 MG tablet Take 1 tablet (40 mg total) by mouth 2 (two) times daily. 04/02/17   Dorothea Ogle, MD  potassium chloride SA (K-DUR,KLOR-CON) 20 MEQ tablet Take 2 tablets (40 mEq total) by mouth daily. Patient not taking: Reported on 11/04/2016 09/09/16   Lonia Blood, MD    SPRINTEC 28 0.25-35 MG-MCG tablet Take 1 tablet by mouth daily. 01/24/17   [provider]    Family History Family History  Problem Relation Age of Onset  . Stroke Mother     Social History Social History   Tobacco Use  . Smoking status: Former Games developer  . Smokeless tobacco: Never Used  Substance Use Topics  . Alcohol use: Yes    Comment: occasional  . Drug use: No     Allergies   Latex   Review of Systems Review of Systems  All other systems reviewed and are negative.    Physical Exam Updated Vital Signs BP (!) 142/108 (BP Location: Right Arm)   Pulse (!) 118   Temp 98.2 F (36.8 C) (Oral)   Resp 20   Ht 5\' 2"  (1.575 m)   Wt 64 kg   SpO2 100%   BMI 25.79 kg/m   Physical Exam  Constitutional: She is oriented to person, place, and time. She appears well-developed and well-nourished.  HENT:  Head: Normocephalic and atraumatic.  Eyes: Pupils are equal, round, and reactive to light. Conjunctivae and EOM are normal.  Neck: Normal range of motion. Neck supple.  Cardiovascular: Normal rate and regular rhythm. Exam reveals no gallop and no friction rub.  No murmur heard. Pulmonary/Chest: Effort normal and breath sounds normal. No respiratory distress. She has no wheezes. She has no rales. She exhibits no tenderness.  Abdominal: Soft. Bowel sounds are normal. She exhibits no distension and no mass. There is tenderness. There is no rebound and no guarding.  Epigastric abdominal tenderness  Musculoskeletal: Normal range of motion. She exhibits no edema or tenderness.  Neurological: She is alert and oriented to person, place, and time.  Skin: Skin is warm and dry.  Psychiatric: She has a normal mood and affect. Her behavior is normal. Judgment and thought content normal.  Nursing note and vitals reviewed.    ED Treatments / Results  Labs (all labs ordered are listed, but only abnormal results are displayed) Labs Reviewed  CBC WITH DIFFERENTIAL/PLATELET  - Abnormal; Notable for the following components:      Result Value   WBC 14.0 (*)    Hemoglobin 11.9 (*)    MCHC 28.4 (*)    RDW 23.3 (*)    Neutro Abs 11.3 (*)    Lymphs Abs 0.6 (*)    Monocytes Absolute 1.9 (*)    Abs Immature Granulocytes 0.11 (*)    All other components within normal limits  COMPREHENSIVE METABOLIC PANEL - Abnormal; Notable for the following components:   Potassium 5.2 (*)    CO2 8 (*)    Glucose, Bld 126 (*)    Creatinine, Ser 1.55 (*)    Total Protein 8.5 (*)    AST 314 (*)    ALT 151 (*)    Total Bilirubin 3.4 (*)  GFR calc non Af Amer 42 (*)    GFR calc Af Amer 49 (*)    Anion gap 33 (*)    All other components within normal limits  LIPASE, BLOOD - Abnormal; Notable for the following components:   Lipase 814 (*)    All other components within normal limits  URINALYSIS, ROUTINE W REFLEX MICROSCOPIC - Abnormal; Notable for the following components:   Color, Urine AMBER (*)    APPearance CLOUDY (*)    Hgb urine dipstick SMALL (*)    Ketones, ur 80 (*)    Protein, ur 100 (*)    All other components within normal limits  BLOOD GAS, VENOUS - Abnormal; Notable for the following components:   pH, Ven 7.163 (*)    pCO2, Ven 24.0 (*)    Bicarbonate 8.3 (*)    Acid-base deficit 19.1 (*)    All other components within normal limits  I-STAT CG4 LACTIC ACID, ED - Abnormal; Notable for the following components:   Lactic Acid, Venous 6.23 (*)    All other components within normal limits  ETHANOL  PREGNANCY, URINE  RAPID URINE DRUG SCREEN, HOSP PERFORMED  SALICYLATE LEVEL  POTASSIUM  I-STAT TROPONIN, ED  I-STAT CG4 LACTIC ACID, ED    EKG EKG Interpretation  Date/Time:  Sunday March 22 2018 03:53:34 EDT Ventricular Rate:  120 PR Interval:    QRS Duration: 92 QT Interval:  336 QTC Calculation: 475 R Axis:   114 Text Interpretation:  Sinus tachycardia Right axis deviation RSR' in V1 or V2, probably normal variant Abnormal T, consider ischemia,  diffuse leads Baseline wander in lead(s) I II aVR Confirmed by Nicanor Alcon, April ([id number]) on 03/22/2018 5:13:31 AM   Radiology No results found.  Procedures Procedures (including critical care time) CRITICAL CARE Performed by: Roxy Horseman   Total critical care time: 55 minutes  Critical care time was exclusive of separately billable procedures and treating other patients.  Critical care was necessary to treat or prevent imminent or life-threatening deterioration.  Critical care was time spent personally by me on the following activities: development of treatment plan with patient and/or surrogate as well as nursing, discussions with consultants, evaluation of patient's response to treatment, examination of patient, obtaining history from patient or surrogate, ordering and performing treatments and interventions, ordering and review of laboratory studies, ordering and review of radiographic studies, pulse oximetry and re-evaluation of patient's condition.  Medications Ordered in ED Medications  sodium chloride 0.9 % bolus 1,000 mL (has no administration in time range)  morphine 4 MG/ML injection 4 mg (4 mg Intravenous Given 03/22/18 0348)  ondansetron (ZOFRAN) injection 4 mg (4 mg Intravenous Given 03/22/18 0348)     Initial Impression / Assessment and Plan / ED Course  I have reviewed the triage vital signs and the nursing notes.  Pertinent labs & imaging results that were available during my care of the patient were reviewed by me and considered in my medical decision making (see chart for details).     Patient with nausea and vomiting x4 days.  Has history of pancreatitis.  She reports severe epigastric abdominal pain.  States that this feels similar to her prior pancreatitis.  She has been vomiting in the emergency department.  She is noted to be tachycardic in the ED.  Is afebrile.  I doubt sepsis.  I believe this to be more likely related to her pancreas.  Could be alcoholic  ketoacidosis.  We will continue giving fluids.  Patient has  received 2 L thus far, will give an additional liter.  Dr. Nicanor Alcon recommends giving banana bag with D5 normal saline.    Lipase is 814.  LFTs are also slightly elevated.  She does have a significant lactic acidosis of 6.23, believe this to be acute onset of vomiting and dehydration.  She does have 80 ketones in her urine.  We will recheck this at 3 hours after fluids.  Anticipate this to decreased significantly.  She has been very high in the past.    Repeat lactate at 3 hours is 3.4, trending down.  Will continue fluids.  Discussed with Dr. Antionette Char, who recommends adding RUQ [place] due to elevated bili today.  Will have morning team admit patient.   MDM Reviewed: previous chart, nursing note and vitals Reviewed previous: labs and CT scan Interpretation: labs and ECG (lactate 6.2, lipase 814, 80 ketones in urine) Total time providing critical care: 30-74 minutes. This excludes time spent performing separately reportable procedures and services. Consults: admitting MD     Final Clinical Impressions(s) / ED Diagnoses   Final diagnoses:  Acute pancreatitis, unspecified complication status, unspecified pancreatitis type  Metabolic acidosis    ED Discharge Orders    None       Roxy Horseman, PA-C 03/22/18 1610    Palumbo, April, MD 03/22/18 [phone]

## 2018-03-22 NOTE — ED Notes (Signed)
Bed: WA09 Expected date:  Expected time:  Means of arrival:  Comments: 37 yo F/NV Dizziness

## 2018-03-22 NOTE — ED Triage Notes (Signed)
Patient arrived with EMS from home c/o N/V and abdominal pain started 4 days ago. Per pt she woke up this morning with severe abdominal pain . Pt had zofran 8mg  IV and fentanyl . Pt verbalize has hx of pancreatitis. Denies fever.

## 2018-03-22 NOTE — H&P (Addendum)
CC/Reason for consult: Admitted for severe alcoholic pancreatitis - metallic foreign body incidentally found on CT Abdomen  HPI: Susan [name] is an 37 y.o. female with hx of alcohol abuse, gastritis, pancreatitis, depression, gastritis, whom presented to the emergency department 03/22/18 with abdominal discomfort, nausea/vomiting that has been present for approximately 10 days.  Currently states she drinks probably 5 drinks per day.  She describes her abdominal pain is been localized to the epigastric region.  She described as being sharp and constant.  The epigastric discomfort has been present for approximately 2 weeks.  She denies f/c/diarrhea. She denies any known foreign body ingestion.  Past Medical History:  Diagnosis Date  . Alcohol abuse   . Depression   . Gastritis   . GIB (gastrointestinal bleeding)   . Iron deficiency anemia   . Pancreatitis     Past Surgical History:  Procedure Laterality Date  . ESOPHAGOGASTRODUODENOSCOPY (EGD) WITH PROPOFOL N/A 06/05/2016   Procedure: ESOPHAGOGASTRODUODENOSCOPY (EGD) WITH PROPOFOL;  Surgeon: Otis Brace, MD;  Location: [facility];  Service: Gastroenterology;  Laterality: N/A;    Family History  Problem Relation Age of Onset  . Stroke Mother     Social:  reports that she has quit smoking. She has never used smokeless tobacco. She reports that she drinks alcohol. She reports that she does not use drugs.  Allergies:  Allergies  Allergen Reactions  . Latex Rash    Tape irritates the skin    Medications: I have reviewed the patient's current medications.  Results for orders placed or performed during the hospital encounter of 03/22/18 (from the past 48 hour(s))  CBC with Differential/Platelet     Status: Abnormal   Collection Time: 03/22/18  3:31 AM  Result Value Ref Range   WBC 14.0 (H) 4.0 - 10.5 K/uL   RBC 4.57 3.87 - 5.11 MIL/uL   Hemoglobin 11.9 (L) 12.0 - 15.0 g/dL   HCT 41.9 36.0 - 46.0 %   MCV 91.7 80.0 -  100.0 fL   MCH 26.0 26.0 - 34.0 pg   MCHC 28.4 (L) 30.0 - 36.0 g/dL   RDW 23.3 (H) 11.5 - 15.5 %   Platelets 390 150 - 400 K/uL   nRBC 0.0 0.0 - 0.2 %   Neutrophils Relative % 81 %   Neutro Abs 11.3 (H) 1.7 - 7.7 K/uL   Lymphocytes Relative 4 %   Lymphs Abs 0.6 (L) 0.7 - 4.0 K/uL   Monocytes Relative 13 %   Monocytes Absolute 1.9 (H) 0.1 - 1.0 K/uL   Eosinophils Relative 0 %   Eosinophils Absolute 0.1 0.0 - 0.5 K/uL   Basophils Relative 1 %   Basophils Absolute 0.1 0.0 - 0.1 K/uL   Immature Granulocytes 1 %   Abs Immature Granulocytes 0.11 (H) 0.00 - 0.07 K/uL   Tear Drop Cells PRESENT    Burr Cells PRESENT     Comment: Performed at [facility], [place] [name] [place], [place] [address]  Comprehensive metabolic panel     Status: Abnormal   Collection Time: 03/22/18  3:31 AM  Result Value Ref Range   Sodium 139 135 - 145 mmol/L   Potassium 5.2 (H) 3.5 - 5.1 mmol/L    Comment: SLIGHT HEMOLYSIS   Chloride 98 98 - 111 mmol/L   CO2 8 (L) 22 - 32 mmol/L   Glucose, Bld 126 (H) 70 - 99 mg/dL   BUN 12 6 - 20 mg/dL   Creatinine, Ser 1.55 (H) 0.44 -  1.00 mg/dL   Calcium 9.3 8.9 - 10.3 mg/dL   Total Protein 8.5 (H) 6.5 - 8.1 g/dL   Albumin 4.7 3.5 - 5.0 g/dL   AST 314 (H) 15 - 41 U/L    Comment: RESULTS CONFIRMED BY MANUAL DILUTION   ALT 151 (H) 0 - 44 U/L   Alkaline Phosphatase 77 38 - 126 U/L   Total Bilirubin 3.4 (H) 0.3 - 1.2 mg/dL   GFR calc non Af Amer 42 (L) >60 mL/min   GFR calc Af Amer 49 (L) >60 mL/min    Comment: (NOTE) The eGFR has been calculated using the CKD EPI equation. This calculation has not been validated in all clinical situations. eGFR's persistently <60 mL/min signify possible Chronic Kidney Disease.    Anion gap 33 (H) 5 - 15    Comment: REPEATED TO VERIFY Performed at [facility], [place] [address], [place], [place] [address]   Lipase, blood     Status: Abnormal   Collection Time: 03/22/18  3:31 AM  Result Value  Ref Range   Lipase 814 (H) 11 - 51 U/L    Comment: RESULTS CONFIRMED BY MANUAL DILUTION Performed at [facility],  [place] [address], [place], [place] [address]   Ethanol     Status: None   Collection Time: 03/22/18  3:31 AM  Result Value Ref Range   Alcohol, Ethyl (B) <10 <10 mg/dL    Comment: (NOTE) Lowest detectable limit for serum alcohol is 10 mg/dL. For medical purposes only. Performed at [facility], [place] [address], [place], [place] [address]   I-Stat Troponin, ED (not at [facility])     Status: None   Collection Time: 03/22/18  3:53 AM  Result Value Ref Range   Troponin i, poc 0.00 0.00 - 0.08 ng/mL   Comment 3            Comment: Due to the release kinetics of cTnI, a negative result within the first hours of the onset of symptoms does not rule out myocardial infarction with certainty. If myocardial infarction is still suspected, repeat the test at appropriate intervals.   I-Stat CG4 Lactic Acid, ED     Status: Abnormal   Collection Time: 03/22/18  3:54 AM  Result Value Ref Range   Lactic Acid, Venous 6.23 (HH) 0.5 - 1.9 mmol/L   Comment NOTIFIED PHYSICIAN   Blood gas, venous     Status: Abnormal   Collection Time: 03/22/18  4:40 AM  Result Value Ref Range   pH, Ven 7.163 (LL) 7.250 - 7.430    Comment: CRITICAL RESULT CALLED TO, READ BACK BY AND VERIFIED WITH:  R. BROWNING, PA BY KELLY JARRELL, RRT, RCP AT 0450 ON 03/22/2018    pCO2, Ven 24.0 (L) 44.0 - 60.0 mmHg   pO2, Ven 40.8 32.0 - 45.0 mmHg   Bicarbonate 8.3 (L) 20.0 - 28.0 mmol/L   Acid-base deficit 19.1 (H) 0.0 - 2.0 mmol/L   O2 Saturation 55.0 %   Patient temperature 98.2    Collection site VEIN    Drawn by DRAWN BY RN    Sample type VEIN     Comment: Performed at [facility], [place] [address], [place], [place] [address]  Rapid urine drug screen (hospital performed)     Status: Abnormal   Collection Time: 03/22/18  5:30 AM  Result Value Ref Range    Opiates POSITIVE (A) NONE DETECTED   Cocaine NONE DETECTED NONE DETECTED  Benzodiazepines NONE DETECTED NONE DETECTED   Amphetamines NONE DETECTED NONE DETECTED   Tetrahydrocannabinol NONE DETECTED NONE DETECTED   Barbiturates NONE DETECTED NONE DETECTED    Comment: (NOTE) DRUG SCREEN FOR MEDICAL PURPOSES ONLY.  IF CONFIRMATION IS NEEDED FOR ANY PURPOSE, NOTIFY LAB WITHIN 5 DAYS. LOWEST DETECTABLE LIMITS FOR URINE DRUG SCREEN Drug Class                     Cutoff (ng/mL) Amphetamine and metabolites    1000 Barbiturate and metabolites    200 Benzodiazepine                 505 Tricyclics and metabolites     300 Opiates and metabolites        300 Cocaine and metabolites        300 THC                            50 Performed at [facility], [place] [address] [place], [place] [address]   Urinalysis, Routine w reflex microscopic     Status: Abnormal   Collection Time: 03/22/18  5:30 AM  Result Value Ref Range   Color, Urine AMBER (A) YELLOW    Comment: BIOCHEMICALS MAY BE AFFECTED BY COLOR   APPearance CLOUDY (A) CLEAR   Specific Gravity, Urine 1.018 1.005 - 1.030   pH 5.0 5.0 - 8.0   Glucose, UA NEGATIVE NEGATIVE mg/dL   Hgb urine dipstick SMALL (A) NEGATIVE   Bilirubin Urine NEGATIVE NEGATIVE   Ketones, ur 80 (A) NEGATIVE mg/dL   Protein, ur 100 (A) NEGATIVE mg/dL   Nitrite NEGATIVE NEGATIVE   Leukocytes, UA NEGATIVE NEGATIVE   RBC / HPF 6-10 0 - 5 RBC/hpf   WBC, UA 6-10 0 - 5 WBC/hpf   Bacteria, UA NONE SEEN NONE SEEN   Squamous Epithelial / LPF 21-50 0 - 5   Mucus PRESENT    Hyaline Casts, UA PRESENT     Comment: Performed at [facility], [place] [address] [place], [place] [address]  Salicylate level     Status: None   Collection Time: 03/22/18  5:30 AM  Result Value Ref Range   Salicylate Lvl <7.9 2.8 - 30.0 mg/dL    Comment: Performed at [facility], [place] [address] [place], [place] [address]  Pregnancy, urine      Status: None   Collection Time: 03/22/18  5:30 AM  Result Value Ref Range   Preg Test, Ur NEGATIVE NEGATIVE    Comment:        THE SENSITIVITY OF THIS METHODOLOGY IS >20 mIU/mL. Performed at [facility], [place] [name] [place], [place] [address]   Potassium     Status: None   Collection Time: 03/22/18  5:51 AM  Result Value Ref Range   Potassium 4.4 3.5 - 5.1 mmol/L    Comment: Performed at [facility], [place] [address], [place], [place] [address]  I-Stat CG4 Lactic Acid, ED     Status: Abnormal   Collection Time: 03/22/18  6:03 AM  Result Value Ref Range   Lactic Acid, Venous 3.42 (HH) 0.5 - 1.9 mmol/L   Comment NOTIFIED PHYSICIAN   I-Stat CG4 Lactic Acid, ED     Status: None   Collection Time: 03/22/18  8:01 AM  Result Value Ref Range   Lactic Acid, Venous 1.84 0.5 - 1.9 mmol/L  Blood  gas, venous     Status: Abnormal   Collection Time: 03/22/18  8:08 AM  Result Value Ref Range   pH, Ven 7.208 (L) 7.250 - 7.430   pCO2, Ven 27.9 (L) 44.0 - 60.0 mmHg   pO2, Ven 35.4 32.0 - 45.0 mmHg   Bicarbonate 10.7 (L) 20.0 - 28.0 mmol/L   Acid-base deficit 15.7 (H) 0.0 - 2.0 mmol/L   O2 Saturation 47.5 %   Patient temperature 98.6    Collection site VEIN    Drawn by DRAWN BY RN    Sample type VEIN     Comment: Performed at [place] [address], [place], [place] [address]  I-Stat CG4 Lactic Acid, ED     Status: None   Collection Time: 03/22/18 10:32 AM  Result Value Ref Range   Lactic Acid, Venous 1.20 0.5 - 1.9 mmol/L    Ct Abdomen Wo Contrast  Result Date: 03/22/2018 CLINICAL DATA:  Nausea and vomiting and abdominal pain for several days EXAM: CT ABDOMEN WITHOUT CONTRAST TECHNIQUE: Multidetector CT imaging of the abdomen was performed following the standard protocol without IV contrast. COMPARISON:  Ultrasound from earlier in the same day FINDINGS: Lower chest: No acute abnormality. Bilateral breast implants are  noted. Hepatobiliary: Diffuse fatty infiltration of the liver is noted. The gallbladder is within normal limits. Pancreas: Pancreas is well visualized although demonstrates diffuse indistinct borders with some peripancreatic inflammatory change. These changes are consistent with acute pancreatitis. Spleen: Normal in size without focal abnormality. Adrenals/Urinary Tract: Adrenal glands are unremarkable. Kidneys are normal, without renal calculi, focal lesion, or hydronephrosis. Stomach/Bowel: Some mild inflammatory changes are noted along the pericolic gutters bilaterally consistent with the known pancreatitis. The pelvis is not evaluated on this image. There is a metallic appearing foreign body identified within a loop of small bowel best seen on image number 58 of series 2. Vascular/Lymphatic: No significant vascular findings are present. No enlarged abdominal or pelvic lymph nodes. Other: No abdominal wall hernia or abnormality. Musculoskeletal: No acute or significant osseous findings. IMPRESSION: Changes consistent with acute pancreatitis with inflammatory changes identified. No focal fluid collection is seen. Metallic foreign body within the loop of small bowel likely related to ingested content. Fatty infiltration of the liver. Electronically Signed   By: Inez Catalina M.D.   On: 03/22/2018 09:37   US Abdomen Limited  Result Date: 03/22/2018 CLINICAL DATA:  Elevated bilirubin EXAM: ULTRASOUND ABDOMEN LIMITED RIGHT UPPER QUADRANT COMPARISON:  CT abdomen 02/28/2017 FINDINGS: Gallbladder: No gallstones or wall thickening visualized. No sonographic Murphy sign noted by sonographer. Common bile duct: Diameter: 2.4 mm Liver: Diffusely echogenic liver parenchyma without focal lesion. Portal vein is patent on color Doppler imaging with normal direction of blood flow towards the liver. Negative for ascites right upper quadrant IMPRESSION: No biliary dilatation Hepatic steatosis Electronically Signed   By: Franchot Gallo M.D.   On: 03/22/2018 07:27    ROS - all of the below systems have been reviewed with the patient and positives are indicated with bold text General: chills, fever or night sweats Eyes: blurry vision or double vision ENT: epistaxis or sore throat Allergy/Immunology: itchy/watery eyes or nasal congestion Hematologic/Lymphatic: bleeding problems, blood clots or swollen lymph nodes Endocrine: temperature intolerance or unexpected weight changes Breast: new or changing breast lumps or nipple discharge Resp: cough, shortness of breath, or wheezing CV: chest pain or dyspnea on exertion GI: as per HPI GU: dysuria, trouble voiding, or hematuria MSK: joint pain or joint  stiffness Neuro: TIA or stroke symptoms Derm: pruritus and skin lesion changes Psych: anxiety and depression  PE Blood pressure 125/85, pulse (!) 109, temperature 98.2 F (36.8 C), temperature source Oral, resp. rate 16, height 5' 2"  (1.575 m), weight 64 kg, last menstrual period 02/20/2018, SpO2 100 %. Constitutional: NAD; conversant; no deformities Eyes: Moist conjunctiva; no lid lag; anicteric; PERRL Neck: Trachea midline; no thyromegaly Lungs: Normal respiratory effort; no tactile fremitus CV: RRR; no palpable thrills; no pitting edema GI: Abd soft, mildly ttp in MEG; no tenderness elsewhere; no rebound, no guarding; no palpable hepatosplenomegaly MSK: Normal gait; no clubbing/cyanosis Psychiatric: Appropriate affect; alert and oriented x3 Lymphatic: No palpable cervical or axillary lymphadenopathy  Results for orders placed or performed during the hospital encounter of 03/22/18 (from the past 48 hour(s))  CBC with Differential/Platelet     Status: Abnormal   Collection Time: 03/22/18  3:31 AM  Result Value Ref Range   WBC 14.0 (H) 4.0 - 10.5 K/uL   RBC 4.57 3.87 - 5.11 MIL/uL   Hemoglobin 11.9 (L) 12.0 - 15.0 g/dL   HCT 41.9 36.0 - 46.0 %   MCV 91.7 80.0 - 100.0 fL   MCH 26.0 26.0 - 34.0 pg   MCHC 28.4 (L)  30.0 - 36.0 g/dL   RDW 23.3 (H) 11.5 - 15.5 %   Platelets 390 150 - 400 K/uL   nRBC 0.0 0.0 - 0.2 %   Neutrophils Relative % 81 %   Neutro Abs 11.3 (H) 1.7 - 7.7 K/uL   Lymphocytes Relative 4 %   Lymphs Abs 0.6 (L) 0.7 - 4.0 K/uL   Monocytes Relative 13 %   Monocytes Absolute 1.9 (H) 0.1 - 1.0 K/uL   Eosinophils Relative 0 %   Eosinophils Absolute 0.1 0.0 - 0.5 K/uL   Basophils Relative 1 %   Basophils Absolute 0.1 0.0 - 0.1 K/uL   Immature Granulocytes 1 %   Abs Immature Granulocytes 0.11 (H) 0.00 - 0.07 K/uL   Tear Drop Cells PRESENT    Burr Cells PRESENT     Comment: Performed at [facility], [place] [address], [place], [place] [address]  Comprehensive metabolic panel     Status: Abnormal   Collection Time: 03/22/18  3:31 AM  Result Value Ref Range   Sodium 139 135 - 145 mmol/L   Potassium 5.2 (H) 3.5 - 5.1 mmol/L    Comment: SLIGHT HEMOLYSIS   Chloride 98 98 - 111 mmol/L   CO2 8 (L) 22 - 32 mmol/L   Glucose, Bld 126 (H) 70 - 99 mg/dL   BUN 12 6 - 20 mg/dL   Creatinine, Ser 1.55 (H) 0.44 - 1.00 mg/dL   Calcium 9.3 8.9 - 10.3 mg/dL   Total Protein 8.5 (H) 6.5 - 8.1 g/dL   Albumin 4.7 3.5 - 5.0 g/dL   AST 314 (H) 15 - 41 U/L    Comment: RESULTS CONFIRMED BY MANUAL DILUTION   ALT 151 (H) 0 - 44 U/L   Alkaline Phosphatase 77 38 - 126 U/L   Total Bilirubin 3.4 (H) 0.3 - 1.2 mg/dL   GFR calc non Af Amer 42 (L) >60 mL/min   GFR calc Af Amer 49 (L) >60 mL/min    Comment: (NOTE) The eGFR has been calculated using the CKD EPI equation. This calculation has not been validated in all clinical situations. eGFR's persistently <60 mL/min signify possible Chronic Kidney Disease.    Anion gap 33 (H) 5 - 15  Comment: REPEATED TO VERIFY Performed at  [facility], [place] [address], [place], [place] [address]   Lipase, blood     Status: Abnormal   Collection Time: 03/22/18  3:31 AM  Result Value Ref Range   Lipase 814 (H) 11 - 51 U/L    Comment:  RESULTS CONFIRMED BY MANUAL DILUTION Performed at [facility], [place] [address], [place], [place] [address]   Ethanol     Status: None   Collection Time: 03/22/18  3:31 AM  Result Value Ref Range   Alcohol, Ethyl (B) <10 <10 mg/dL    Comment: (NOTE) Lowest detectable limit for serum alcohol is 10 mg/dL. For medical purposes only. Performed at [facility], [place] [address] [place], [place] [address]   I-Stat Troponin, ED (not at [facility])     Status: None   Collection Time: 03/22/18  3:53 AM  Result Value Ref Range   Troponin i, poc 0.00 0.00 - 0.08 ng/mL   Comment 3            Comment: Due to the release kinetics of cTnI, a negative result within the first hours of the onset of symptoms does not rule out myocardial infarction with certainty. If myocardial infarction is still suspected, repeat the test at appropriate intervals.   I-Stat CG4 Lactic Acid, ED     Status: Abnormal   Collection Time: 03/22/18  3:54 AM  Result Value Ref Range   Lactic Acid, Venous 6.23 (HH) 0.5 - 1.9 mmol/L   Comment NOTIFIED PHYSICIAN   Blood gas, venous     Status: Abnormal   Collection Time: 03/22/18  4:40 AM  Result Value Ref Range   pH, Ven 7.163 (LL) 7.250 - 7.430    Comment: CRITICAL RESULT CALLED TO, READ BACK BY AND VERIFIED WITH:  R. BROWNING, PA BY KELLY JARRELL, RRT, RCP AT 0450 ON 03/22/2018    pCO2, Ven 24.0 (L) 44.0 - 60.0 mmHg   pO2, Ven 40.8 32.0 - 45.0 mmHg   Bicarbonate 8.3 (L) 20.0 - 28.0 mmol/L   Acid-base deficit 19.1 (H) 0.0 - 2.0 mmol/L   O2 Saturation 55.0 %   Patient temperature 98.2    Collection site VEIN    Drawn by DRAWN BY RN    Sample type VEIN     Comment: Performed at [facility] [place] [address] [place], [place] [address]  Rapid urine drug screen (hospital performed)     Status: Abnormal   Collection Time: 03/22/18  5:30 AM  Result Value Ref Range   Opiates POSITIVE (A) NONE DETECTED   Cocaine NONE  DETECTED NONE DETECTED   Benzodiazepines NONE DETECTED NONE DETECTED   Amphetamines NONE DETECTED NONE DETECTED   Tetrahydrocannabinol NONE DETECTED NONE DETECTED   Barbiturates NONE DETECTED NONE DETECTED    Comment: (NOTE) DRUG SCREEN FOR MEDICAL PURPOSES ONLY.  IF CONFIRMATION IS NEEDED FOR ANY PURPOSE, NOTIFY LAB WITHIN 5 DAYS. LOWEST DETECTABLE LIMITS FOR URINE DRUG SCREEN Drug Class                     Cutoff (ng/mL) Amphetamine and metabolites    1000 Barbiturate and metabolites    200 Benzodiazepine                 248 Tricyclics and metabolites     300 Opiates and metabolites        300 Cocaine and metabolites  300 THC                            50 Performed at [facility], [place] [name] [place], [place] [address]   Urinalysis, Routine w reflex microscopic     Status: Abnormal   Collection Time: 03/22/18  5:30 AM  Result Value Ref Range   Color, Urine AMBER (A) YELLOW    Comment: BIOCHEMICALS MAY BE AFFECTED BY COLOR   APPearance CLOUDY (A) CLEAR   Specific Gravity, Urine 1.018 1.005 - 1.030   pH 5.0 5.0 - 8.0   Glucose, UA NEGATIVE NEGATIVE mg/dL   Hgb urine dipstick SMALL (A) NEGATIVE   Bilirubin Urine NEGATIVE NEGATIVE   Ketones, ur 80 (A) NEGATIVE mg/dL   Protein, ur 100 (A) NEGATIVE mg/dL   Nitrite NEGATIVE NEGATIVE   Leukocytes, UA NEGATIVE NEGATIVE   RBC / HPF 6-10 0 - 5 RBC/hpf   WBC, UA 6-10 0 - 5 WBC/hpf   Bacteria, UA NONE SEEN NONE SEEN   Squamous Epithelial / LPF 21-50 0 - 5   Mucus PRESENT    Hyaline Casts, UA PRESENT     Comment: Performed at [facility], [place] [address], [place], [place] [address]  Salicylate level     Status: None   Collection Time: 03/22/18  5:30 AM  Result Value Ref Range   Salicylate Lvl <3.5 2.8 - 30.0 mg/dL    Comment: Performed at [facility], [place] [address] [place], [place] [address]  Pregnancy, urine     Status: None   Collection Time: 03/22/18   5:30 AM  Result Value Ref Range   Preg Test, Ur NEGATIVE NEGATIVE    Comment:        THE SENSITIVITY OF THIS METHODOLOGY IS >20 mIU/mL. Performed at [facility], [place] [address], [place], [place] [address]   Potassium     Status: None   Collection Time: 03/22/18  5:51 AM  Result Value Ref Range   Potassium 4.4 3.5 - 5.1 mmol/L    Comment: Performed at [facility], [place] [address] [place], [place] [address]  I-Stat CG4 Lactic Acid, ED     Status: Abnormal   Collection Time: 03/22/18  6:03 AM  Result Value Ref Range   Lactic Acid, Venous 3.42 (HH) 0.5 - 1.9 mmol/L   Comment NOTIFIED PHYSICIAN   I-Stat CG4 Lactic Acid, ED     Status: None   Collection Time: 03/22/18  8:01 AM  Result Value Ref Range   Lactic Acid, Venous 1.84 0.5 - 1.9 mmol/L  Blood gas, venous     Status: Abnormal   Collection Time: 03/22/18  8:08 AM  Result Value Ref Range   pH, Ven 7.208 (L) 7.250 - 7.430   pCO2, Ven 27.9 (L) 44.0 - 60.0 mmHg   pO2, Ven 35.4 32.0 - 45.0 mmHg   Bicarbonate 10.7 (L) 20.0 - 28.0 mmol/L   Acid-base deficit 15.7 (H) 0.0 - 2.0 mmol/L   O2 Saturation 47.5 %   Patient temperature 98.6    Collection site VEIN    Drawn by DRAWN BY RN    Sample type VEIN     Comment: Performed at [facility], [place] [name] [place], [place] [address]  I-Stat CG4 Lactic Acid, ED     Status: None   Collection Time: 03/22/18 10:32 AM  Result Value Ref Range  Lactic Acid, Venous 1.20 0.5 - 1.9 mmol/L    Ct Abdomen Wo Contrast  Result Date: 03/22/2018 CLINICAL DATA:  Nausea and vomiting and abdominal pain for several days EXAM: CT ABDOMEN WITHOUT CONTRAST TECHNIQUE: Multidetector CT imaging of the abdomen was performed following the standard protocol without IV contrast. COMPARISON:  Ultrasound from earlier in the same day FINDINGS: Lower chest: No acute abnormality. Bilateral breast implants are noted. Hepatobiliary: Diffuse fatty infiltration  of the liver is noted. The gallbladder is within normal limits. Pancreas: Pancreas is well visualized although demonstrates diffuse indistinct borders with some peripancreatic inflammatory change. These changes are consistent with acute pancreatitis. Spleen: Normal in size without focal abnormality. Adrenals/Urinary Tract: Adrenal glands are unremarkable. Kidneys are normal, without renal calculi, focal lesion, or hydronephrosis. Stomach/Bowel: Some mild inflammatory changes are noted along the pericolic gutters bilaterally consistent with the known pancreatitis. The pelvis is not evaluated on this image. There is a metallic appearing foreign body identified within a loop of small bowel best seen on image number 58 of series 2. Vascular/Lymphatic: No significant vascular findings are present. No enlarged abdominal or pelvic lymph nodes. Other: No abdominal wall hernia or abnormality. Musculoskeletal: No acute or significant osseous findings. IMPRESSION: Changes consistent with acute pancreatitis with inflammatory changes identified. No focal fluid collection is seen. Metallic foreign body within the loop of small bowel likely related to ingested content. Fatty infiltration of the liver. Electronically Signed   By: Inez Catalina M.D.   On: 03/22/2018 09:37   US Abdomen Limited  Result Date: 03/22/2018 CLINICAL DATA:  Elevated bilirubin EXAM: ULTRASOUND ABDOMEN LIMITED RIGHT UPPER QUADRANT COMPARISON:  CT abdomen 02/28/2017 FINDINGS: Gallbladder: No gallstones or wall thickening visualized. No sonographic Murphy sign noted by sonographer. Common bile duct: Diameter: 2.4 mm Liver: Diffusely echogenic liver parenchyma without focal lesion. Portal vein is patent on color Doppler imaging with normal direction of blood flow towards the liver. Negative for ascites right upper quadrant IMPRESSION: No biliary dilatation Hepatic steatosis Electronically Signed   By: Franchot Gallo M.D.   On: 03/22/2018 07:27     A/P: Susan [name] is an 37 y.o. female with severe alcoholic pancreatitis found to have incidental metallic appearing foreign body within the lumen of the small bowel - unclear what this is, she states she hasn't had solid food in a couple weeks - when she had some steak  -We discussed the management of these moving forward - currently no signs of perforation; the vast majority of these will pass without any interventions per se or surgery -Can obtain abdominal XR tomorrow to monitor progress -Would NOT obtain any MRIs until it passes -Will follow with you -Remainder of care per medicine team  Sharon Mt. Dema Severin, M.D. [facility]

## 2018-03-23 LAB — COMPREHENSIVE METABOLIC PANEL
ALT: 79 U/L — ABNORMAL HIGH (ref 0–44)
AST: 133 U/L — AB (ref 15–41)
Albumin: 3 g/dL — ABNORMAL LOW (ref 3.5–5.0)
Alkaline Phosphatase: 45 U/L (ref 38–126)
Anion gap: 11 (ref 5–15)
BUN: 11 mg/dL (ref 6–20)
CHLORIDE: 99 mmol/L (ref 98–111)
CO2: 26 mmol/L (ref 22–32)
Calcium: 8.2 mg/dL — ABNORMAL LOW (ref 8.9–10.3)
Creatinine, Ser: 0.8 mg/dL (ref 0.44–1.00)
GFR calc Af Amer: >60 mL/min (ref >60)
Glucose, Bld: 110 mg/dL — ABNORMAL HIGH (ref 70–99)
POTASSIUM: 3.6 mmol/L (ref 3.5–5.1)
SODIUM: 136 mmol/L (ref 135–145)
Total Bilirubin: 2.7 mg/dL — ABNORMAL HIGH (ref 0.3–1.2)
Total Protein: 5.4 g/dL — ABNORMAL LOW (ref 6.5–8.1)

## 2018-03-23 LAB — CBC
HEMATOCRIT: 30.2 % — AB (ref 36.0–46.0)
HEMOGLOBIN: 9 g/dL — AB (ref 12.0–15.0)
MCH: 26.2 pg (ref 26.0–34.0)
MCHC: 29.8 g/dL — AB (ref 30.0–36.0)
MCV: 88 fL (ref 80.0–100.0)
NRBC: 0 % (ref 0.0–0.2)
Platelets: 215 10*3/uL (ref 150–400)
RBC: 3.43 MIL/uL — ABNORMAL LOW (ref 3.87–5.11)
RDW: 23.1 % — ABNORMAL HIGH (ref 11.5–15.5)
WBC: 7.9 10*3/uL (ref 4.0–10.5)

## 2018-03-23 LAB — VOLATILES,BLD-ACETONE,ETHANOL,ISOPROP,METHANOL
ACETONE, BLOOD: 0.023 % (ref 0.000–0.010)
Ethanol, blood: NEGATIVE % (ref 0.000–0.010)
Isopropanol, blood: NEGATIVE % (ref 0.000–0.010)
METHANOL, BLOOD: NEGATIVE % (ref 0.000–0.010)

## 2018-03-23 MED ORDER — SORBITOL 70 % SOLN
20.0000 mL | Freq: Two times a day (BID) | Status: DC
  Administered 2018-03-23 (×2): 20 mL via ORAL
  Filled 2018-03-23 (×2): qty 30

## 2018-03-23 MED ORDER — DOCUSATE SODIUM 100 MG PO CAPS
100.0000 mg | ORAL_CAPSULE | Freq: Three times a day (TID) | ORAL | Status: DC
  Administered 2018-03-23 – 2018-03-25 (×6): 100 mg via ORAL
  Filled 2018-03-23 (×6): qty 1

## 2018-03-23 MED ORDER — HYDROCORTISONE 1 % EX CREA
1.0000 "application " | TOPICAL_CREAM | Freq: Three times a day (TID) | CUTANEOUS | Status: DC | PRN
  Filled 2018-03-23: qty 28

## 2018-03-23 MED ORDER — ADULT MULTIVITAMIN W/MINERALS CH
1.0000 | ORAL_TABLET | Freq: Every day | ORAL | Status: DC
  Administered 2018-03-23 – 2018-03-25 (×3): 1 via ORAL
  Filled 2018-03-23 (×3): qty 1

## 2018-03-23 MED ORDER — ALUM & MAG HYDROXIDE-SIMETH 200-200-20 MG/5ML PO SUSP
15.0000 mL | Freq: Once | ORAL | Status: AC
  Administered 2018-03-23: 15 mL via ORAL
  Filled 2018-03-23: qty 30

## 2018-03-23 MED ORDER — FAMOTIDINE IN NACL 20-0.9 MG/50ML-% IV SOLN
20.0000 mg | Freq: Two times a day (BID) | INTRAVENOUS | Status: DC
  Administered 2018-03-23 – 2018-03-25 (×5): 20 mg via INTRAVENOUS
  Filled 2018-03-23 (×5): qty 50

## 2018-03-23 MED ORDER — KETOROLAC TROMETHAMINE 30 MG/ML IJ SOLN
30.0000 mg | Freq: Four times a day (QID) | INTRAMUSCULAR | Status: DC
  Administered 2018-03-23 – 2018-03-25 (×10): 30 mg via INTRAVENOUS
  Filled 2018-03-23 (×10): qty 1

## 2018-03-23 MED ORDER — SUCRALFATE 1 GM/10ML PO SUSP
1.0000 g | Freq: Three times a day (TID) | ORAL | Status: DC
  Administered 2018-03-23 – 2018-03-25 (×9): 1 g via ORAL
  Filled 2018-03-23 (×9): qty 10

## 2018-03-23 MED ORDER — ALUM & MAG HYDROXIDE-SIMETH 200-200-20 MG/5ML PO SUSP
30.0000 mL | ORAL | Status: DC | PRN
  Administered 2018-03-23: 30 mL via ORAL
  Filled 2018-03-23: qty 30

## 2018-03-23 MED ORDER — BOOST / RESOURCE BREEZE PO LIQD CUSTOM
1.0000 | Freq: Two times a day (BID) | ORAL | Status: DC
  Administered 2018-03-23: 1 via ORAL

## 2018-03-23 NOTE — Progress Notes (Signed)
Patient ID: Susan [name], female   DOB: 03-14-1981, 37 y.o.   MRN: [id number]       Subjective: Patient having some upper abdominal pain.  Some nausea.  No BM yet since admission.  Objective: Vital signs in last 24 hours: Temp:  [98.6 F (37 C)-98.8 F (37.1 C)] 98.8 F (37.1 C) (10/14 0419) Pulse Rate:  [79-109] 84 (10/14 0600) Resp:  [12-25] 14 (10/14 0600) BP: (108-133)/(78-105) 119/81 (10/14 0600) SpO2:  [98 %-100 %] 98 % (10/14 0600)    Intake/Output from previous day: 10/13 0701 - 10/14 0700 In: 4578.6 [I.V.:3578.6; IV Piggyback:1000] Out: -  Intake/Output this shift: No intake/output data recorded.  PE: Abd: soft, appropriately tender in upper abdomen, no peritonitis, +BS, ND  Lab Results:  Recent Labs    03/22/18 0331 03/23/18 0405  WBC 14.0* 7.9  HGB 11.9* 9.0*  HCT 41.9 30.2*  PLT 390 215   BMET Recent Labs    03/22/18 0331 03/22/18 0551 03/23/18 0405  NA 139  --  136  K 5.2* 4.4 3.6  CL 98  --  99  CO2 8*  --  26  GLUCOSE 126*  --  110*  BUN 12  --  11  CREATININE 1.55*  --  0.80  CALCIUM 9.3  --  8.2*   PT/INR No results for input(s): LABPROT, INR in the last 72 hours. CMP     Component Value Date/Time   NA 136 03/23/2018 0405   K 3.6 03/23/2018 0405   CL 99 03/23/2018 0405   CO2 26 03/23/2018 0405   GLUCOSE 110 (H) 03/23/2018 0405   BUN 11 03/23/2018 0405   CREATININE 0.80 03/23/2018 0405   CALCIUM 8.2 (L) 03/23/2018 0405   PROT 5.4 (L) 03/23/2018 0405   ALBUMIN 3.0 (L) 03/23/2018 0405   AST 133 (H) 03/23/2018 0405   ALT 79 (H) 03/23/2018 0405   ALKPHOS 45 03/23/2018 0405   BILITOT 2.7 (H) 03/23/2018 0405   GFRNONAA >60 03/23/2018 0405   GFRAA >60 03/23/2018 0405   Lipase     Component Value Date/Time   LIPASE 814 (H) 03/22/2018 0331       Studies/Results: Ct Abdomen Wo Contrast  Result Date: 03/22/2018 CLINICAL DATA:  Nausea and vomiting and abdominal pain for several days EXAM: CT ABDOMEN WITHOUT CONTRAST  TECHNIQUE: Multidetector CT imaging of the abdomen was performed following the standard protocol without IV contrast. COMPARISON:  Ultrasound from earlier in the same day FINDINGS: Lower chest: No acute abnormality. Bilateral breast implants are noted. Hepatobiliary: Diffuse fatty infiltration of the liver is noted. The gallbladder is within normal limits. Pancreas: Pancreas is well visualized although demonstrates diffuse indistinct borders with some peripancreatic inflammatory change. These changes are consistent with acute pancreatitis. Spleen: Normal in size without focal abnormality. Adrenals/Urinary Tract: Adrenal glands are unremarkable. Kidneys are normal, without renal calculi, focal lesion, or hydronephrosis. Stomach/Bowel: Some mild inflammatory changes are noted along the pericolic gutters bilaterally consistent with the known pancreatitis. The pelvis is not evaluated on this image. There is a metallic appearing foreign body identified within a loop of small bowel best seen on image number 58 of series 2. Vascular/Lymphatic: No significant vascular findings are present. No enlarged abdominal or pelvic lymph nodes. Other: No abdominal wall hernia or abnormality. Musculoskeletal: No acute or significant osseous findings. IMPRESSION: Changes consistent with acute pancreatitis with inflammatory changes identified. No focal fluid collection is seen. Metallic foreign body within the loop of small bowel likely related to  ingested content. Fatty infiltration of the liver. Electronically Signed   By: Alcide Clever M.D.   On: 03/22/2018 09:37   US Abdomen Limited  Result Date: 03/22/2018 CLINICAL DATA:  Elevated bilirubin EXAM: ULTRASOUND ABDOMEN LIMITED RIGHT UPPER QUADRANT COMPARISON:  CT abdomen 02/28/2017 FINDINGS: Gallbladder: No gallstones or wall thickening visualized. No sonographic Murphy sign noted by sonographer. Common bile duct: Diameter: 2.4 mm Liver: Diffusely echogenic liver parenchyma without  focal lesion. Portal vein is patent on color Doppler imaging with normal direction of blood flow towards the liver. Negative for ascites right upper quadrant IMPRESSION: No biliary dilatation Hepatic steatosis Electronically Signed   By: Marlan Palau M.D.   On: 03/22/2018 07:27   Dg Chest Port 1 View  Result Date: 03/22/2018 CLINICAL DATA:  Central chest pain for 2 days EXAM: PORTABLE CHEST 1 VIEW COMPARISON:  03/30/2017 FINDINGS: The heart size and mediastinal contours are within normal limits. Both lungs are clear. The visualized skeletal structures are unremarkable. Bilateral breast implants are noted. IMPRESSION: No active disease. Electronically Signed   By: Alcide Clever M.D.   On: 03/22/2018 11:13    Anti-infectives: Anti-infectives (From admission, onward)   None       Assessment/Plan  Incidental finding of FB in small intestines  -no evidence of peritonitis.   -this is a problem that generally resolves on its own without any intervention such as surgery or endoscopic procedure. -colace to soften stool and help induce BM.  Would give more like miralax, etc, but given her pancreatitis and her still being NPO, don't want to give her too much oral intake.  Once she begins taking in liquids, miralax can be added -strain stool to find object once she has a BM. -no surgical intervention warranted.   -repeat abdominal films as needed -please call back if patient develops peritonitis or you have any other concerns/questions.  ETOH pancreatitis Per primary team  FEN - NPO, colace VTE - SCDs ID - none   LOS: 1 day    Letha Cape , [facility] 03/23/2018, 7:59 AM Pager: [phone]

## 2018-03-23 NOTE — Progress Notes (Signed)
[facility] PROGRESS NOTE  Susan [name] WFU:[id number] DOB: Feb 09, 1981 DOA: 03/22/2018 PCP: Artelia Laroche, CNM   Narrative: 37 year old with prior history of pancreatitis 1 year ago Recent emotional turmoil with increase in alcohol use about 40 ounces a day of hard liquor as going through divorce Readmitted 10/14 with pancreatitis-ultrasound negative for gb stone--no biliary duct dilatation however CT scan showed foreign body abdomen?  Earring Admitted to stepdown   A & Plan  Pancreatitis-no acute metabolic or biochemical anomalies-lipase not repeated-LFTs have trended down nicely however--expectant management-she is still nauseous but this is likely secondary to IV Dilaudid which have switched out for Toradol  Recent acute alcoholism secondary to psychosocial stressors-has not been drinking per her report since last year when she had pancreatitis-monitor on protocol as this is only 0 we can decide on MedSurg management if she stabilizes over the next 12 to 24 hours  Retained foreign body-start sorbitol if she is able to take p.o. and strain stool as per general surgery colleagues-appreciate their input  Lactic acidosis secondary to sirs and aki--now resolved--labs am  Hyperkalemia on admission-secondary to AKI and resolved  Steatosis secondary to alcohol--resolved  Bipolar-currently not taking her Effexor X are secondary to n.p.o. Status  Tattoos-consider screening hep C etc. etc.  Reflux had some vomiting this morning so resume Pepcid IV start Carafate if able to take p.o.    Full code probable transfer to [place] later today if all stable and eating   Verlon Au, MD  [facility] Direct contact: [phone] --[place] [place]  --[email or web address]; password [email or web address]  7PM-7AM contact night coverage as above 03/23/2018, 8:28 AM  LOS: 1 day   Consultants:  General surgery  Procedures:  CT  Antimicrobials:  None  Interval history/Subjective: Vomiting in  the room but just received Dilaudid Pain is minimal Has not been able to take p.o. No chest pain No fever No chills  Objective:  Vitals:  Vitals:   03/23/18 0600 03/23/18 0825  BP: 119/81   Pulse: 84   Resp: 14   Temp:  97.8 F (36.6 C)  SpO2: 98%     Exam:  . Awake alert pleasant no distress no icterus no pallor slightly plethoric face . Chest clear no added sound . S1-S2 no murmur . Abdomen soft nontender no hepatosplenomegaly . No lower extremity edema . No thyromegaly no LAN--does not appear to be tremulous   I have personally reviewed the following:   Labs:  LFTs now down from the 800 range to 133/79  BUN/creatinine 11/0.8 down from prior  Potassium 3.6 currently   Imaging studies:  None  Medical tests:  No  Test discussed with performing physician:  Yes discussed with PA for surgery  Decision to obtain old records:  No  Review and summation of old records:  Yes  Scheduled Meds: . docusate sodium  100 mg Oral TID  . ketorolac  30 mg Intravenous Q6H  . LORazepam  0-4 mg Intravenous Q6H   Or  . LORazepam  0-4 mg Oral Q6H  . [START ON 03/24/2018] LORazepam  0-4 mg Intravenous Q12H   Or  . [START ON 03/24/2018] LORazepam  0-4 mg Oral Q12H  . mouth rinse  15 mL Mouth Rinse BID  . sorbitol  20 mL Oral BID  . sucralfate  1 g Oral TID WC & HS  . thiamine injection  100 mg Intravenous Daily   Continuous Infusions: . famotidine (PEPCID) IV    . lactated ringers 75 mL/hr at  03/23/18 0825    Active Problems:   Acute pancreatitis   LOS: 1 day

## 2018-03-23 NOTE — Care Management Note (Signed)
Case Management Note  Patient Details  Name: Susan [name] MRN: [id number] Date of Birth: [birth year]  Subjective/Objective:                  Nausea and vomiting  Action/Plan: Will follow for progression of care and clinical status. Will follow for case management needs none present at this time.  Expected Discharge Date:                  Expected Discharge Plan:  Home/Self Care  In-House Referral:     Discharge planning Services  CM Consult  Post Acute Care Choice:    Choice offered to:     DME Arranged:    DME Agency:     HH Arranged:    HH Agency:     Status of Service:  In process, will continue to follow  If discussed at Long Length of Stay Meetings, dates discussed:    Additional Comments:  Golda Acre, RN 03/23/2018, 10:07 AM

## 2018-03-23 NOTE — Progress Notes (Signed)
Initial Nutrition Assessment  DOCUMENTATION CODES:   Not applicable  INTERVENTION:  - Will order Boost Breeze BID, each supplement provides 250 kcal and 9 grams of protein. - Will order daily multivitamin with minerals. - Continue to encourage PO intakes.  - Continue diet advancement as medically feasible. - Will monitor for need for diet education PTA.    NUTRITION DIAGNOSIS:   Increased nutrient needs related to acute illness as evidenced by estimated needs.  GOAL:   Patient will meet greater than or equal to 90% of their needs  MONITOR:   PO intake, Supplement acceptance, Weight trends, Labs, I & O's  REASON FOR ASSESSMENT:   Malnutrition Screening Tool  ASSESSMENT:   37 year old with prior history of pancreatitis 1 year ago. She has had several stressors recently and began drinking ~40 ounces of hard liquor/day. She was admitted with hx of pancreatitis; ultrasound negative for gallbladder stone, no biliary duct dilation or obstruction. CT abdomen showed foreign body in that region.    BMI indicates overweight. No intakes documented since admission. Patient reports that she was able to keep stool softener and laxatives, which she took with water, down this AM. She did not attempt to eat breakfast as she is still feeling very unwell and feeling nauseated.   Patient reports that for the past 1 week she has only been consuming liquids, unable to keep down any solid foods. For "awhile" prior to the past 1 week, she had very poor appetite and was many eating small portions of items like applesauce into which she mixed baby food, carrots, and drinking [employer] shakes. She feels that shakes were actually making her feel worse.   Patient is unsure of any recent weight changes. Per chart review, weight has been stable for the past 1 year. Will continue to monitor closely.   Medications reviewed; 100 mg Colace TID, 20 mg IV Pepcid BID, 1 g Carafate TID, 100 mg IV thiamine TID.   Labs reviewed; Ca: 8.2 mg/dL, LFTs elevated.  IVF; LR @ 75 mL/hr.      NUTRITION - FOCUSED PHYSICAL EXAM:  Completed to upper body/not to lower body; no muscle and no fat wasting.   Diet Order:   Diet Order            Diet full liquid Room service appropriate? Yes; Fluid consistency: Thin  Diet effective now              EDUCATION NEEDS:   Not appropriate for education at this time  Skin:  Skin Assessment: Reviewed RN Assessment  Last BM:  10/14  Height:   Ht Readings from Last 1 Encounters:  03/22/18 5\' 2"  (1.575 m)    Weight:   Wt Readings from Last 1 Encounters:  03/22/18 64 kg    Ideal Body Weight:  50 kg  BMI:  Body mass index is 25.79 kg/m.  Estimated Nutritional Needs:   Kcal:  1920-2115 (30-33 kcal/kg)  Protein:  96-109 grams (1.5-1.7 grams/kg)  Fluid:  >/= 2 L/day     Trenton Gammon, MS, RD, LDN, [facility] Inpatient Clinical Dietitian Pager # [phone] After hours/weekend pager # [phone]

## 2018-03-24 LAB — CBC
HEMATOCRIT: 30.9 % — AB (ref 36.0–46.0)
HEMOGLOBIN: 9.2 g/dL — AB (ref 12.0–15.0)
MCH: 26.6 pg (ref 26.0–34.0)
MCHC: 29.8 g/dL — ABNORMAL LOW (ref 30.0–36.0)
MCV: 89.3 fL (ref 80.0–100.0)
Platelets: 228 10*3/uL (ref 150–400)
RBC: 3.46 MIL/uL — AB (ref 3.87–5.11)
RDW: 23.5 % — ABNORMAL HIGH (ref 11.5–15.5)
WBC: 5.7 10*3/uL (ref 4.0–10.5)
nRBC: 0 % (ref 0.0–0.2)

## 2018-03-24 LAB — CREATININE, SERUM
CREATININE: 0.8 mg/dL (ref 0.44–1.00)
GFR calc Af Amer: >60 mL/min (ref >60)
GFR calc non Af Amer: >60 mL/min (ref >60)

## 2018-03-24 LAB — COMPREHENSIVE METABOLIC PANEL
ALT: 84 U/L — AB (ref 0–44)
AST: 183 U/L — AB (ref 15–41)
Albumin: 3.2 g/dL — ABNORMAL LOW (ref 3.5–5.0)
Alkaline Phosphatase: 68 U/L (ref 38–126)
Anion gap: 9 (ref 5–15)
BUN: 9 mg/dL (ref 6–20)
CHLORIDE: 104 mmol/L (ref 98–111)
CO2: 27 mmol/L (ref 22–32)
CREATININE: 0.71 mg/dL (ref 0.44–1.00)
Calcium: 8.7 mg/dL — ABNORMAL LOW (ref 8.9–10.3)
GFR calc Af Amer: >60 mL/min (ref >60)
GFR calc non Af Amer: >60 mL/min (ref >60)
GLUCOSE: 106 mg/dL — AB (ref 70–99)
POTASSIUM: 3.2 mmol/L — AB (ref 3.5–5.1)
Sodium: 140 mmol/L (ref 135–145)
Total Bilirubin: 2.2 mg/dL — ABNORMAL HIGH (ref 0.3–1.2)
Total Protein: 5.6 g/dL — ABNORMAL LOW (ref 6.5–8.1)

## 2018-03-24 LAB — LIPASE, BLOOD: LIPASE: 115 U/L — AB (ref 11–51)

## 2018-03-24 LAB — HEPATITIS PANEL, ACUTE
HCV Ab: <0.1 s/co ratio (ref 0.0–0.9)
HEP A IGM: NEGATIVE
HEP B C IGM: NEGATIVE
Hepatitis B Surface Ag: NEGATIVE

## 2018-03-24 MED ORDER — POTASSIUM CHLORIDE CRYS ER 20 MEQ PO TBCR
40.0000 meq | EXTENDED_RELEASE_TABLET | Freq: Every day | ORAL | Status: DC
  Administered 2018-03-24 – 2018-03-25 (×2): 40 meq via ORAL
  Filled 2018-03-24 (×2): qty 2

## 2018-03-24 MED ORDER — HEPARIN SODIUM (PORCINE) 5000 UNIT/ML IJ SOLN
5000.0000 [IU] | Freq: Three times a day (TID) | INTRAMUSCULAR | Status: DC
  Filled 2018-03-24: qty 1

## 2018-03-24 MED ORDER — POLYETHYLENE GLYCOL 3350 17 G PO PACK
17.0000 g | PACK | Freq: Every day | ORAL | Status: DC | PRN
  Filled 2018-03-24: qty 1

## 2018-03-24 MED ORDER — SORBITOL 70 % SOLN
20.0000 mL | Freq: Every day | Status: DC
  Administered 2018-03-24 – 2018-03-25 (×2): 20 mL via ORAL
  Filled 2018-03-24 (×2): qty 30

## 2018-03-24 NOTE — Progress Notes (Signed)
[facility] PROGRESS NOTE  Susan [name] IBB:[id number] DOB: [birth year] DOA: 03/22/2018 PCP: Susan [name], CNM   Narrative:  38 year old with prior history of pancreatitis 1 year ago Recent emotional turmoil with increase in alcohol use about 40 ounces a day of hard liquor as going through divorce Readmitted 10/14 with pancreatitis-ultrasound negative for gb stone--no biliary duct dilatation however CT scan showed foreign body abdomen?  Earring Admitted to stepdown   A & Plan  Pancreatitis-no acute metabolic or biochemical anomalies-lipase down, LFT down-can start diet stressors-CIWA neg--can monitor on floor--d/c protocol  Retained foreign body-cutr back sorbitol to od. Monitor.  Given small size on scout film [reviewed] and no pain wouldn't image with XR at this time but expectantly manage and strain stool as per gen surg  Lactic acidosis secondary to sirs and aki--now resolved  Hyperkalemia on admission-secondary to AKI and resolved  acutehepatitis secondary to alcohol binge--resolving--interval LFT in am  Bipolar-resume effexor  Tattoos-consider screening hep C etc. Etc. As OP  Reflux had some vomiting this morning so resume Pepcid IV start Carafate if able to take p.o.    Full code probable transfer to [place] later today if all stable and eating   Verlon Au, MD  [facility] Direct contact: [phone] --[address] [place]  --[email or web address]; password [email or web address]  7PM-7AM contact night coverage as above 03/24/2018, 7:33 AM  LOS: 2 days   Consultants:  General surgery  Procedures:  CT  Antimicrobials:  None  Interval history/Subjective:  No n/v  tol liquids without  Objective:  Vitals:  Vitals:   03/24/18 0338 03/24/18 0400  BP:  122/87  Pulse:  69  Resp:  15  Temp: 98.2 F (36.8 C)   SpO2:  99%    Exam:  . Awake alert no distress, less distress no vomit . Chest clear no added sound . S1-S2 no murmur . Abdomen soft multiple  tattoos on abd, former belly ring . No LE edema . No thyromegaly no LAN--does not appear to be tremulous   I have personally reviewed the following:   Labs:  LFTs now down from the 800 range to 133/79--->AST/ ALT 183/84  BIli 2.7-->2.2  BUN/creatinine 11/0.8 down from prior  Potassium 3.2 currently   Imaging studies:  None  Medical tests:  No  Test discussed with performing physician:  n  Decision to obtain old records:  No  Review and summation of old records:  Yes  Scheduled Meds: . docusate sodium  100 mg Oral TID  . feeding supplement  1 Container Oral BID BM  . ketorolac  30 mg Intravenous Q6H  . LORazepam  0-4 mg Intravenous Q12H   Or  . LORazepam  0-4 mg Oral Q12H  . multivitamin with minerals  1 tablet Oral Daily  . potassium chloride  40 mEq Oral Daily  . sorbitol  20 mL Oral BID  . sucralfate  1 g Oral TID WC & HS  . thiamine injection  100 mg Intravenous Daily   Continuous Infusions: . famotidine (PEPCID) IV Stopped (03/23/18 2137)    Active Problems:   Acute pancreatitis   LOS: 2 days

## 2018-03-24 NOTE — Consult Note (Signed)
**H&P note type was entered in error, original note should have been a consult note  CC/Reason for consult: Admitted for severe alcoholic pancreatitis - metallic foreign body incidentally found on CT Abdomen - by Dr. Florene Glen  HPI: Susan [name] is an 37 y.o. female with hx of alcohol abuse, gastritis, pancreatitis, depression, gastritis, whom presented to the emergency department 03/22/18 with abdominal discomfort, nausea/vomiting that has been present for approximately 10 days.  Currently states she drinks probably 5 drinks per day.  She describes her abdominal pain is been localized to the epigastric region.  She described as being sharp and constant.  The epigastric discomfort has been present for approximately 2 weeks.  She denies f/c/diarrhea. She denies any known foreign body ingestion.  Past Medical History:  Diagnosis Date  . Alcohol abuse   . Depression   . Gastritis   . GIB (gastrointestinal bleeding)   . Iron deficiency anemia   . Pancreatitis     Past Surgical History:  Procedure Laterality Date  . ESOPHAGOGASTRODUODENOSCOPY (EGD) WITH PROPOFOL N/A 06/05/2016   Procedure: ESOPHAGOGASTRODUODENOSCOPY (EGD) WITH PROPOFOL;  Surgeon: Otis Brace, MD;  Location: [facility];  Service: Gastroenterology;  Laterality: N/A;    Family History  Problem Relation Age of Onset  . Stroke Mother     Social:  reports that she has quit smoking. She has never used smokeless tobacco. She reports that she drinks alcohol. She reports that she does not use drugs.  Allergies:  Allergies  Allergen Reactions  . Latex Rash    Tape irritates the skin    Medications: I have reviewed the patient's current medications.  Results for orders placed or performed during the hospital encounter of 03/22/18 (from the past 48 hour(s))  CBC with Differential/Platelet     Status: Abnormal   Collection Time: 03/22/18  3:31 AM  Result Value Ref Range   WBC 14.0 (H) 4.0 - 10.5 K/uL   RBC 4.57  3.87 - 5.11 MIL/uL   Hemoglobin 11.9 (L) 12.0 - 15.0 g/dL   HCT 41.9 36.0 - 46.0 %   MCV 91.7 80.0 - 100.0 fL   MCH 26.0 26.0 - 34.0 pg   MCHC 28.4 (L) 30.0 - 36.0 g/dL   RDW 23.3 (H) 11.5 - 15.5 %   Platelets 390 150 - 400 K/uL   nRBC 0.0 0.0 - 0.2 %   Neutrophils Relative % 81 %   Neutro Abs 11.3 (H) 1.7 - 7.7 K/uL   Lymphocytes Relative 4 %   Lymphs Abs 0.6 (L) 0.7 - 4.0 K/uL   Monocytes Relative 13 %   Monocytes Absolute 1.9 (H) 0.1 - 1.0 K/uL   Eosinophils Relative 0 %   Eosinophils Absolute 0.1 0.0 - 0.5 K/uL   Basophils Relative 1 %   Basophils Absolute 0.1 0.0 - 0.1 K/uL   Immature Granulocytes 1 %   Abs Immature Granulocytes 0.11 (H) 0.00 - 0.07 K/uL   Tear Drop Cells PRESENT    Burr Cells PRESENT     Comment: Performed at [facility], [place] [address], [place], [place] [address]  Comprehensive metabolic panel     Status: Abnormal   Collection Time: 03/22/18  3:31 AM  Result Value Ref Range   Sodium 139 135 - 145 mmol/L   Potassium 5.2 (H) 3.5 - 5.1 mmol/L    Comment: SLIGHT HEMOLYSIS   Chloride 98 98 - 111 mmol/L   CO2 8 (L) 22 - 32 mmol/L   Glucose, Bld 126 (  H) 70 - 99 mg/dL   BUN 12 6 - 20 mg/dL   Creatinine, Ser 1.55 (H) 0.44 - 1.00 mg/dL   Calcium 9.3 8.9 - 10.3 mg/dL   Total Protein 8.5 (H) 6.5 - 8.1 g/dL   Albumin 4.7 3.5 - 5.0 g/dL   AST 314 (H) 15 - 41 U/L    Comment: RESULTS CONFIRMED BY MANUAL DILUTION   ALT 151 (H) 0 - 44 U/L   Alkaline Phosphatase 77 38 - 126 U/L   Total Bilirubin 3.4 (H) 0.3 - 1.2 mg/dL   GFR calc non Af Amer 42 (L) >60 mL/min   GFR calc Af Amer 49 (L) >60 mL/min    Comment: (NOTE) The eGFR has been calculated using the CKD EPI equation. This calculation has not been validated in all clinical situations. eGFR's persistently <60 mL/min signify possible Chronic Kidney Disease.    Anion gap 33 (H) 5 - 15    Comment: REPEATED TO VERIFY Performed at [facility], [place] [address]  [place], [place] [address]   Lipase, blood     Status: Abnormal   Collection Time: 03/22/18  3:31 AM  Result Value Ref Range   Lipase 814 (H) 11 - 51 U/L    Comment: RESULTS CONFIRMED BY MANUAL DILUTION Performed at [facility], [place] [address], [place], [place] [address]   Ethanol     Status: None   Collection Time: 03/22/18  3:31 AM  Result Value Ref Range   Alcohol, Ethyl (B) <10 <10 mg/dL    Comment: (NOTE) Lowest detectable limit for serum alcohol is 10 mg/dL. For medical purposes only. Performed at [facility], [place] [address] [place], [place] [address]   I-Stat Troponin, ED (not at [facility])     Status: None   Collection Time: 03/22/18  3:53 AM  Result Value Ref Range   Troponin i, poc 0.00 0.00 - 0.08 ng/mL   Comment 3            Comment: Due to the release kinetics of cTnI, a negative result within the first hours of the onset of symptoms does not rule out myocardial infarction with certainty. If myocardial infarction is still suspected, repeat the test at appropriate intervals.   I-Stat CG4 Lactic Acid, ED     Status: Abnormal   Collection Time: 03/22/18  3:54 AM  Result Value Ref Range   Lactic Acid, Venous 6.23 (HH) 0.5 - 1.9 mmol/L   Comment NOTIFIED PHYSICIAN   Blood gas, venous     Status: Abnormal   Collection Time: 03/22/18  4:40 AM  Result Value Ref Range   pH, Ven 7.163 (LL) 7.250 - 7.430    Comment: CRITICAL RESULT CALLED TO, READ BACK BY AND VERIFIED WITH:  R. BROWNING, PA BY KELLY JARRELL, RRT, RCP AT 0450 ON 03/22/2018    pCO2, Ven 24.0 (L) 44.0 - 60.0 mmHg   pO2, Ven 40.8 32.0 - 45.0 mmHg   Bicarbonate 8.3 (L) 20.0 - 28.0 mmol/L   Acid-base deficit 19.1 (H) 0.0 - 2.0 mmol/L   O2 Saturation 55.0 %   Patient temperature 98.2    Collection site VEIN    Drawn by DRAWN BY RN    Sample type VEIN     Comment: Performed at [facility], [place] [address], [place],  [address]  Rapid urine drug  screen (hospital performed)     Status: Abnormal   Collection Time: 03/22/18  5:30 AM  Result Value Ref Range   Opiates POSITIVE (A) NONE DETECTED   Cocaine NONE DETECTED NONE DETECTED   Benzodiazepines NONE DETECTED NONE DETECTED   Amphetamines NONE DETECTED NONE DETECTED   Tetrahydrocannabinol NONE DETECTED NONE DETECTED   Barbiturates NONE DETECTED NONE DETECTED    Comment: (NOTE) DRUG SCREEN FOR MEDICAL PURPOSES ONLY.  IF CONFIRMATION IS NEEDED FOR ANY PURPOSE, NOTIFY LAB WITHIN 5 DAYS. LOWEST DETECTABLE LIMITS FOR URINE DRUG SCREEN Drug Class                     Cutoff (ng/mL) Amphetamine and metabolites    1000 Barbiturate and metabolites    200 Benzodiazepine                 935 Tricyclics and metabolites     300 Opiates and metabolites        300 Cocaine and metabolites        300 THC                            50 Performed at [facility], [place] [address] [place], [place] [address]   Urinalysis, Routine w reflex microscopic     Status: Abnormal   Collection Time: 03/22/18  5:30 AM  Result Value Ref Range   Color, Urine AMBER (A) YELLOW    Comment: BIOCHEMICALS MAY BE AFFECTED BY COLOR   APPearance CLOUDY (A) CLEAR   Specific Gravity, Urine 1.018 1.005 - 1.030   pH 5.0 5.0 - 8.0   Glucose, UA NEGATIVE NEGATIVE mg/dL   Hgb urine dipstick SMALL (A) NEGATIVE   Bilirubin Urine NEGATIVE NEGATIVE   Ketones, ur 80 (A) NEGATIVE mg/dL   Protein, ur 100 (A) NEGATIVE mg/dL   Nitrite NEGATIVE NEGATIVE   Leukocytes, UA NEGATIVE NEGATIVE   RBC / HPF 6-10 0 - 5 RBC/hpf   WBC, UA 6-10 0 - 5 WBC/hpf   Bacteria, UA NONE SEEN NONE SEEN   Squamous Epithelial / LPF 21-50 0 - 5   Mucus PRESENT    Hyaline Casts, UA PRESENT     Comment: Performed at [facility], [place] [address] [place], [place] [address]  Salicylate level     Status: None   Collection Time: 03/22/18  5:30 AM  Result Value Ref Range   Salicylate Lvl <0.0 2.8 - 30.0 mg/dL     Comment: Performed at [facility], [place] [address], [place], [place] [address]  Pregnancy, urine     Status: None   Collection Time: 03/22/18  5:30 AM  Result Value Ref Range   Preg Test, Ur NEGATIVE NEGATIVE    Comment:        THE SENSITIVITY OF THIS METHODOLOGY IS >20 mIU/mL. Performed at [facility], [place] [address] [place], [place] [address]   Potassium     Status: None   Collection Time: 03/22/18  5:51 AM  Result Value Ref Range   Potassium 4.4 3.5 - 5.1 mmol/L    Comment: Performed at [facility], [place] [address], [place], [place] [address]  I-Stat CG4 Lactic Acid, ED     Status: Abnormal   Collection Time: 03/22/18  6:03 AM  Result Value Ref Range   Lactic Acid, Venous 3.42 (HH) 0.5 - 1.9 mmol/L   Comment NOTIFIED PHYSICIAN   I-Stat CG4 Lactic Acid, ED     Status: None   Collection Time: 03/22/18  8:01 AM  Result Value Ref Range   Lactic Acid, Venous 1.84 0.5 - 1.9 mmol/L  Blood gas, venous     Status: Abnormal   Collection Time: 03/22/18  8:08 AM  Result Value Ref Range   pH, Ven 7.208 (L) 7.250 - 7.430   pCO2, Ven 27.9 (L) 44.0 - 60.0 mmHg   pO2, Ven 35.4 32.0 - 45.0 mmHg   Bicarbonate 10.7 (L) 20.0 - 28.0 mmol/L   Acid-base deficit 15.7 (H) 0.0 - 2.0 mmol/L   O2 Saturation 47.5 %   Patient temperature 98.6    Collection site VEIN    Drawn by DRAWN BY RN    Sample type VEIN     Comment: Performed at [place] [address] [place], [place] [address]  I-Stat CG4 Lactic Acid, ED     Status: None   Collection Time: 03/22/18 10:32 AM  Result Value Ref Range   Lactic Acid, Venous 1.20 0.5 - 1.9 mmol/L    Ct Abdomen Wo Contrast  Result Date: 03/22/2018 CLINICAL DATA:  Nausea and vomiting and abdominal pain for several days EXAM: CT ABDOMEN WITHOUT CONTRAST TECHNIQUE: Multidetector CT imaging of the abdomen was performed following the standard protocol without IV contrast. COMPARISON:   Ultrasound from earlier in the same day FINDINGS: Lower chest: No acute abnormality. Bilateral breast implants are noted. Hepatobiliary: Diffuse fatty infiltration of the liver is noted. The gallbladder is within normal limits. Pancreas: Pancreas is well visualized although demonstrates diffuse indistinct borders with some peripancreatic inflammatory change. These changes are consistent with acute pancreatitis. Spleen: Normal in size without focal abnormality. Adrenals/Urinary Tract: Adrenal glands are unremarkable. Kidneys are normal, without renal calculi, focal lesion, or hydronephrosis. Stomach/Bowel: Some mild inflammatory changes are noted along the pericolic gutters bilaterally consistent with the known pancreatitis. The pelvis is not evaluated on this image. There is a metallic appearing foreign body identified within a loop of small bowel best seen on image number 58 of series 2. Vascular/Lymphatic: No significant vascular findings are present. No enlarged abdominal or pelvic lymph nodes. Other: No abdominal wall hernia or abnormality. Musculoskeletal: No acute or significant osseous findings. IMPRESSION: Changes consistent with acute pancreatitis with inflammatory changes identified. No focal fluid collection is seen. Metallic foreign body within the loop of small bowel likely related to ingested content. Fatty infiltration of the liver. Electronically Signed   By: Inez Catalina M.D.   On: 03/22/2018 09:37   US Abdomen Limited  Result Date: 03/22/2018 CLINICAL DATA:  Elevated bilirubin EXAM: ULTRASOUND ABDOMEN LIMITED RIGHT UPPER QUADRANT COMPARISON:  CT abdomen 02/28/2017 FINDINGS: Gallbladder: No gallstones or wall thickening visualized. No sonographic Murphy sign noted by sonographer. Common bile duct: Diameter: 2.4 mm Liver: Diffusely echogenic liver parenchyma without focal lesion. Portal vein is patent on color Doppler imaging with normal direction of blood flow towards the liver. Negative for  ascites right upper quadrant IMPRESSION: No biliary dilatation Hepatic steatosis Electronically Signed   By: Franchot Gallo M.D.   On: 03/22/2018 07:27    ROS - all of the below systems have been reviewed with the patient and positives are indicated with bold text General: chills, fever or night sweats Eyes: blurry vision or double vision ENT: epistaxis or sore throat Allergy/Immunology: itchy/watery eyes or nasal congestion Hematologic/Lymphatic: bleeding problems, blood clots or swollen lymph nodes Endocrine: temperature intolerance or unexpected weight changes Breast: new or changing breast lumps or nipple discharge Resp: cough, shortness of breath, or wheezing CV: chest pain  or dyspnea on exertion GI: as per HPI GU: dysuria, trouble voiding, or hematuria MSK: joint pain or joint stiffness Neuro: TIA or stroke symptoms Derm: pruritus and skin lesion changes Psych: anxiety and depression  PE Blood pressure 125/85, pulse (!) 109, temperature 98.2 F (36.8 C), temperature source Oral, resp. rate 16, height _0  (1.575 m), weight 64 kg, last menstrual period 02/20/2018, SpO2 100 %. Constitutional: NAD; conversant; no deformities Eyes: Moist conjunctiva; no lid lag; anicteric; PERRL Neck: Trachea midline; no thyromegaly Lungs: Normal respiratory effort; no tactile fremitus CV: RRR; no palpable thrills; no pitting edema GI: Abd soft, mildly ttp in MEG; no tenderness elsewhere; no rebound, no guarding; no palpable hepatosplenomegaly MSK: Normal gait; no clubbing/cyanosis Psychiatric: Appropriate affect; alert and oriented x3 Lymphatic: No palpable cervical or axillary lymphadenopathy  Results for orders placed or performed during the hospital encounter of 03/22/18 (from the past 48 hour(s))  CBC with Differential/Platelet     Status: Abnormal   Collection Time: 03/22/18  3:31 AM  Result Value Ref Range   WBC 14.0 (H) 4.0 - 10.5 K/uL   RBC 4.57 3.87 - 5.11 MIL/uL   Hemoglobin 11.9  (L) 12.0 - 15.0 g/dL   HCT 41.9 36.0 - 46.0 %   MCV 91.7 80.0 - 100.0 fL   MCH 26.0 26.0 - 34.0 pg   MCHC 28.4 (L) 30.0 - 36.0 g/dL   RDW 23.3 (H) 11.5 - 15.5 %   Platelets 390 150 - 400 K/uL   nRBC 0.0 0.0 - 0.2 %   Neutrophils Relative % 81 %   Neutro Abs 11.3 (H) 1.7 - 7.7 K/uL   Lymphocytes Relative 4 %   Lymphs Abs 0.6 (L) 0.7 - 4.0 K/uL   Monocytes Relative 13 %   Monocytes Absolute 1.9 (H) 0.1 - 1.0 K/uL   Eosinophils Relative 0 %   Eosinophils Absolute 0.1 0.0 - 0.5 K/uL   Basophils Relative 1 %   Basophils Absolute 0.1 0.0 - 0.1 K/uL   Immature Granulocytes 1 %   Abs Immature Granulocytes 0.11 (H) 0.00 - 0.07 K/uL   Tear Drop Cells PRESENT    Burr Cells PRESENT     Comment: Performed at [facility], [place] [address], [place], [place] [address]  Comprehensive metabolic panel     Status: Abnormal   Collection Time: 03/22/18  3:31 AM  Result Value Ref Range   Sodium 139 135 - 145 mmol/L   Potassium 5.2 (H) 3.5 - 5.1 mmol/L    Comment: SLIGHT HEMOLYSIS   Chloride 98 98 - 111 mmol/L   CO2 8 (L) 22 - 32 mmol/L   Glucose, Bld 126 (H) 70 - 99 mg/dL   BUN 12 6 - 20 mg/dL   Creatinine, Ser 1.55 (H) 0.44 - 1.00 mg/dL   Calcium 9.3 8.9 - 10.3 mg/dL   Total Protein 8.5 (H) 6.5 - 8.1 g/dL   Albumin 4.7 3.5 - 5.0 g/dL   AST 314 (H) 15 - 41 U/L    Comment: RESULTS CONFIRMED BY MANUAL DILUTION   ALT 151 (H) 0 - 44 U/L   Alkaline Phosphatase 77 38 - 126 U/L   Total Bilirubin 3.4 (H) 0.3 - 1.2 mg/dL   GFR calc non Af Amer 42 (L) >60 mL/min   GFR calc Af Amer 49 (L) >60 mL/min    Comment: (NOTE) The eGFR has been calculated using the CKD EPI equation. This calculation has not been validated in all clinical situations. eGFR's  persistently <60 mL/min signify possible Chronic Kidney Disease.    Anion gap 33 (H) 5 - 15    Comment: REPEATED TO VERIFY Performed at [facility], [place] [address] [place], [place] [address]   Lipase, blood      Status: Abnormal   Collection Time: 03/22/18  3:31 AM  Result Value Ref Range   Lipase 814 (H) 11 - 51 U/L    Comment: RESULTS CONFIRMED BY MANUAL DILUTION Performed at [facility], [place] [address], [place], [place] [address]   Ethanol     Status: None   Collection Time: 03/22/18  3:31 AM  Result Value Ref Range   Alcohol, Ethyl (B) <10 <10 mg/dL    Comment: (NOTE) Lowest detectable limit for serum alcohol is 10 mg/dL. For medical purposes only. Performed at [facility], [place] [address], [place], [place] [address]   I-Stat Troponin, ED (not at [facility])     Status: None   Collection Time: 03/22/18  3:53 AM  Result Value Ref Range   Troponin i, poc 0.00 0.00 - 0.08 ng/mL   Comment 3            Comment: Due to the release kinetics of cTnI, a negative result within the first hours of the onset of symptoms does not rule out myocardial infarction with certainty. If myocardial infarction is still suspected, repeat the test at appropriate intervals.   I-Stat CG4 Lactic Acid, ED     Status: Abnormal   Collection Time: 03/22/18  3:54 AM  Result Value Ref Range   Lactic Acid, Venous 6.23 (HH) 0.5 - 1.9 mmol/L   Comment NOTIFIED PHYSICIAN   Blood gas, venous     Status: Abnormal   Collection Time: 03/22/18  4:40 AM  Result Value Ref Range   pH, Ven 7.163 (LL) 7.250 - 7.430    Comment: CRITICAL RESULT CALLED TO, READ BACK BY AND VERIFIED WITH:  R. BROWNING, PA BY KELLY JARRELL, RRT, RCP AT 0450 ON 03/22/2018    pCO2, Ven 24.0 (L) 44.0 - 60.0 mmHg   pO2, Ven 40.8 32.0 - 45.0 mmHg   Bicarbonate 8.3 (L) 20.0 - 28.0 mmol/L   Acid-base deficit 19.1 (H) 0.0 - 2.0 mmol/L   O2 Saturation 55.0 %   Patient temperature 98.2    Collection site VEIN    Drawn by DRAWN BY RN    Sample type VEIN     Comment: Performed at [facility], [place] [address], [place], [place] [address]  Rapid urine drug screen (hospital performed)     Status: Abnormal    Collection Time: 03/22/18  5:30 AM  Result Value Ref Range   Opiates POSITIVE (A) NONE DETECTED   Cocaine NONE DETECTED NONE DETECTED   Benzodiazepines NONE DETECTED NONE DETECTED   Amphetamines NONE DETECTED NONE DETECTED   Tetrahydrocannabinol NONE DETECTED NONE DETECTED   Barbiturates NONE DETECTED NONE DETECTED    Comment: (NOTE) DRUG SCREEN FOR MEDICAL PURPOSES ONLY.  IF CONFIRMATION IS NEEDED FOR ANY PURPOSE, NOTIFY LAB WITHIN 5 DAYS. LOWEST DETECTABLE LIMITS FOR URINE DRUG SCREEN Drug Class                     Cutoff (ng/mL) Amphetamine and metabolites    1000 Barbiturate and metabolites    200 Benzodiazepine                 517 Tricyclics and metabolites     300  Opiates and metabolites        300 Cocaine and metabolites        300 THC                            50 Performed at [facility] [address], [place], [place] [address]   Urinalysis, Routine w reflex microscopic     Status: Abnormal   Collection Time: 03/22/18  5:30 AM  Result Value Ref Range   Color, Urine AMBER (A) YELLOW    Comment: BIOCHEMICALS MAY BE AFFECTED BY COLOR   APPearance CLOUDY (A) CLEAR   Specific Gravity, Urine 1.018 1.005 - 1.030   pH 5.0 5.0 - 8.0   Glucose, UA NEGATIVE NEGATIVE mg/dL   Hgb urine dipstick SMALL (A) NEGATIVE   Bilirubin Urine NEGATIVE NEGATIVE   Ketones, ur 80 (A) NEGATIVE mg/dL   Protein, ur 100 (A) NEGATIVE mg/dL   Nitrite NEGATIVE NEGATIVE   Leukocytes, UA NEGATIVE NEGATIVE   RBC / HPF 6-10 0 - 5 RBC/hpf   WBC, UA 6-10 0 - 5 WBC/hpf   Bacteria, UA NONE SEEN NONE SEEN   Squamous Epithelial / LPF 21-50 0 - 5   Mucus PRESENT    Hyaline Casts, UA PRESENT     Comment: Performed at [facility], [place] [address] [place], [place] [address]  Salicylate level     Status: None   Collection Time: 03/22/18  5:30 AM  Result Value Ref Range   Salicylate Lvl <8.2 2.8 - 30.0 mg/dL    Comment: Performed at [facility], [place] [address], [place], [place] [address]  Pregnancy, urine     Status: None   Collection Time: 03/22/18  5:30 AM  Result Value Ref Range   Preg Test, Ur NEGATIVE NEGATIVE    Comment:        THE SENSITIVITY OF THIS METHODOLOGY IS >20 mIU/mL. Performed at [facility], [place] [address], [place], [place] [address]   Potassium     Status: None   Collection Time: 03/22/18  5:51 AM  Result Value Ref Range   Potassium 4.4 3.5 - 5.1 mmol/L    Comment: Performed at [facility], [place] [address] [place], [place] [address]  I-Stat CG4 Lactic Acid, ED     Status: Abnormal   Collection Time: 03/22/18  6:03 AM  Result Value Ref Range   Lactic Acid, Venous 3.42 (HH) 0.5 - 1.9 mmol/L   Comment NOTIFIED PHYSICIAN   I-Stat CG4 Lactic Acid, ED     Status: None   Collection Time: 03/22/18  8:01 AM  Result Value Ref Range   Lactic Acid, Venous 1.84 0.5 - 1.9 mmol/L  Blood gas, venous     Status: Abnormal   Collection Time: 03/22/18  8:08 AM  Result Value Ref Range   pH, Ven 7.208 (L) 7.250 - 7.430   pCO2, Ven 27.9 (L) 44.0 - 60.0 mmHg   pO2, Ven 35.4 32.0 - 45.0 mmHg   Bicarbonate 10.7 (L) 20.0 - 28.0 mmol/L   Acid-base deficit 15.7 (H) 0.0 - 2.0 mmol/L   O2 Saturation 47.5 %   Patient temperature 98.6    Collection site VEIN    Drawn by DRAWN BY RN    Sample type VEIN     Comment: Performed at [facility], [place] [address] [place], [place] [address]  I-Stat CG4 Lactic  Acid, ED     Status: None   Collection Time: 03/22/18 10:32 AM  Result Value Ref Range   Lactic Acid, Venous 1.20 0.5 - 1.9 mmol/L    Ct Abdomen Wo Contrast  Result Date: 03/22/2018 CLINICAL DATA:  Nausea and vomiting and abdominal pain for several days EXAM: CT ABDOMEN WITHOUT CONTRAST TECHNIQUE: Multidetector CT imaging of the abdomen was performed following the standard protocol without IV contrast. COMPARISON:  Ultrasound from earlier in the same day  FINDINGS: Lower chest: No acute abnormality. Bilateral breast implants are noted. Hepatobiliary: Diffuse fatty infiltration of the liver is noted. The gallbladder is within normal limits. Pancreas: Pancreas is well visualized although demonstrates diffuse indistinct borders with some peripancreatic inflammatory change. These changes are consistent with acute pancreatitis. Spleen: Normal in size without focal abnormality. Adrenals/Urinary Tract: Adrenal glands are unremarkable. Kidneys are normal, without renal calculi, focal lesion, or hydronephrosis. Stomach/Bowel: Some mild inflammatory changes are noted along the pericolic gutters bilaterally consistent with the known pancreatitis. The pelvis is not evaluated on this image. There is a metallic appearing foreign body identified within a loop of small bowel best seen on image number 58 of series 2. Vascular/Lymphatic: No significant vascular findings are present. No enlarged abdominal or pelvic lymph nodes. Other: No abdominal wall hernia or abnormality. Musculoskeletal: No acute or significant osseous findings. IMPRESSION: Changes consistent with acute pancreatitis with inflammatory changes identified. No focal fluid collection is seen. Metallic foreign body within the loop of small bowel likely related to ingested content. Fatty infiltration of the liver. Electronically Signed   By: Inez Catalina M.D.   On: 03/22/2018 09:37   US Abdomen Limited  Result Date: 03/22/2018 CLINICAL DATA:  Elevated bilirubin EXAM: ULTRASOUND ABDOMEN LIMITED RIGHT UPPER QUADRANT COMPARISON:  CT abdomen 02/28/2017 FINDINGS: Gallbladder: No gallstones or wall thickening visualized. No sonographic Murphy sign noted by sonographer. Common bile duct: Diameter: 2.4 mm Liver: Diffusely echogenic liver parenchyma without focal lesion. Portal vein is patent on color Doppler imaging with normal direction of blood flow towards the liver. Negative for ascites right upper quadrant IMPRESSION:  No biliary dilatation Hepatic steatosis Electronically Signed   By: Franchot Gallo M.D.   On: 03/22/2018 07:27    A/P: Susan [name] is an 37 y.o. female with severe alcoholic pancreatitis found to have incidental metallic appearing foreign body within the lumen of the small bowel - unclear what this is, she states she hasn't had solid food in a couple weeks - when she had some steak  -We discussed the management of these moving forward - currently no signs of perforation; the vast majority of these will pass without any interventions per se or surgery -Can obtain abdominal XR tomorrow to monitor progress -Would NOT obtain any MRIs until it passes -Will follow with you -Remainder of care per medicine team  Sharon Mt. Dema [place], M.D. [facility]

## 2018-03-24 NOTE — Plan of Care (Signed)
  Problem: Education: Goal: Knowledge of General Education information will improve Description Including pain rating scale, medication(s)/side effects and non-pharmacologic comfort measures Outcome: Progressing   Problem: Health Behavior/Discharge Planning: Goal: Ability to manage health-related needs will improve Outcome: Progressing   Problem: Clinical Measurements: Goal: Ability to maintain clinical measurements within normal limits will improve Outcome: Progressing Goal: Will remain free from infection Outcome: Progressing Goal: Diagnostic test results will improve Outcome: Progressing Goal: Respiratory complications will improve Outcome: Progressing Goal: Cardiovascular complication will be avoided Outcome: Progressing   Problem: Activity: Goal: Risk for activity intolerance will decrease Outcome: Progressing   Problem: Nutrition: Goal: Adequate nutrition will be maintained Outcome: Progressing   Problem: Coping: Goal: Level of anxiety will decrease Outcome: Progressing   Problem: Elimination: Goal: Will not experience complications related to bowel motility Outcome: Progressing Goal: Will not experience complications related to urinary retention Outcome: Progressing   Problem: Pain Managment: Goal: General experience of comfort will improve Outcome: Progressing   Problem: Safety: Goal: Ability to remain free from injury will improve Outcome: Progressing   Problem: Skin Integrity: Goal: Risk for impaired skin integrity will decrease Outcome: Progressing   Problem: Clinical Measurements: Goal: Complications related to the disease process, condition or treatment will be avoided or minimized Outcome: Progressing

## 2018-03-25 ENCOUNTER — Inpatient Hospital Stay (HOSPITAL_COMMUNITY): Payer: Self-pay

## 2018-03-25 DIAGNOSIS — R945 Abnormal results of liver function studies: Secondary | ICD-10-CM

## 2018-03-25 DIAGNOSIS — K852 Alcohol induced acute pancreatitis without necrosis or infection: Secondary | ICD-10-CM | POA: Diagnosis present

## 2018-03-25 DIAGNOSIS — K7010 Alcoholic hepatitis without ascites: Secondary | ICD-10-CM | POA: Diagnosis present

## 2018-03-25 DIAGNOSIS — N179 Acute kidney failure, unspecified: Secondary | ICD-10-CM | POA: Diagnosis present

## 2018-03-25 DIAGNOSIS — D508 Other iron deficiency anemias: Secondary | ICD-10-CM

## 2018-03-25 DIAGNOSIS — T189XXD Foreign body of alimentary tract, part unspecified, subsequent encounter: Secondary | ICD-10-CM

## 2018-03-25 DIAGNOSIS — Z8719 Personal history of other diseases of the digestive system: Secondary | ICD-10-CM | POA: Insufficient documentation

## 2018-03-25 DIAGNOSIS — E872 Acidosis: Secondary | ICD-10-CM

## 2018-03-25 DIAGNOSIS — Z87448 Personal history of other diseases of urinary system: Secondary | ICD-10-CM | POA: Insufficient documentation

## 2018-03-25 MED ORDER — POLYETHYLENE GLYCOL 3350 17 G PO PACK: 17.0000 g | PACK | Freq: Every day | ORAL | 0 refills | Status: DC

## 2018-03-25 MED ORDER — BOOST HIGH PROTEIN PO LIQD: 1.0000 | Freq: Three times a day (TID) | ORAL | 0 refills | Status: DC

## 2018-03-25 NOTE — Discharge Summary (Addendum)
Physician Discharge Summary  Susan [name] MBW:[id number] DOB: [birth year] DOA: 03/22/2018  PCP: Artelia Laroche, CNM  Admit date: 03/22/2018 Discharge date: 03/25/2018  Admitted From: Home Disposition: Home  Recommendations for Outpatient Follow-up:  1. Follow up with PCP in 2 weeks (has appointment) 2. Monitor LFTs as outpatient.  Home Health: None Equipment/Devices: None  Discharge Condition: Fair CODE STATUS: Full code Diet recommendation: Soft diet, advance to regular in 1-2 days.   Discharge Diagnoses:  Principal Problem:   Alcohol-induced pancreatitis  Active Problems: Acute transaminitis Acute alcoholic hepatitis   Iron deficiency anemia   Foreign body alimentary tract, subsequent encounter Anion gap metabolic acidosis Hyperkalemia  Brief narrative/HPI 37 year old female with history of alcohol induced pancreatitis 1 year back who quit drinking for almost 6 months but due to ongoing stress started drinking heavily in the past 2 weeks.  She presented to the ED with abdominal pain, nausea and vomiting for almost 10 days.  Patient admitted with acute pancreatitis. She had markedly elevated lactic acid of 6.23.  Alcohol level was less than 10, lipase level of 814, potassium of 5.2, metabolic acidosis with anion gap of 33 and transaminitis with AST of 300 and ALT of 151, total bili of 3.4. No signs of biliary tract obstruction on imaging but showed acute pancolitis with inflammatory changes and incidental finding of metallic foreign body in the loop of small bowel.  Patient admitted for further management.  Hospital course Acute alcohol induced pancreatitis Lipase of 814 on presentation with inflammatory changes on abdominal CT. Placed on aggressive IV hydration, made n.p.o. and diet advance slowly.  Has minimal abdominal pain symptoms and has not required any pain medication.  Patient counseled strongly on quitting alcohol and wants to quit on her own.  Refused to get  help with outpatient alcohol therapy program (reported she has been to some of them and would like to seek out help on her own). Tolerating soft diet and advance to regular in 1-2 days.  Metabolic acidosis Secondary to dehydration.  Presented with anion gap of 33 which has resolved with IV fluids.  Acute transaminitis/alcoholic hepatitis No biliary obstruction and imaging shows fatty changes.  Transaminases improving.  Hepatitis panel was negative.  Follow-up as outpatient.  Hyperkalemia Received IV IV fluids.  Now hypokalemic and replenished.  Alcohol abuse Monitored on CIWA.  No signs of withdrawal counseled strongly on cessation.  Acute kidney injury ([place]) Prerenal secondary to dehydration.  Resolved with IV fluids.  Metallic foreign body in the small bowel Incidental finding on abdominal CT.  Seen by surgery and recommends that it should pass without intervention.  Seen by surgery and no acute intervention needed and recommend he should resolve on its own as patient be able to pass it in her stool.  Added MiraLAX to have regular bowel movement.. X ray of the abdomen 2 view done showed non visible foreign body. Possible patient could have passed it out already.  Iron deficiency anemia Continue supplement  Procedure: CT abdomen, ultrasound abdomen  Family communication: none at bedside 37  Disposition: home  Discharge Instructions   Allergies as of 03/25/2018      Reactions   Latex Rash   Tape irritates the skin      Medication List    TAKE these medications   B-12 1000 MCG Tabs Take 1,000 mg by mouth daily.   famotidine 10 MG tablet Commonly known as:  PEPCID Take 10 mg by mouth 2 (two) times daily.   feeding supplement Liqd  Take 237 mLs by mouth 3 (three) times daily between meals.   ferrous sulfate 325 (65 FE) MG tablet Take 325 mg by mouth daily with breakfast.   MULTIVITAMIN ADULT Chew Chew 1 tablet by mouth daily.   venlafaxine XR 37.5 MG 24 hr  capsule Commonly known as:  EFFEXOR-XR Take 37.5-75 mg by mouth at bedtime.   MiraLAX 17 g packet daily.   Follow-up Information    has appt on 10/28 with PCP ( PT DOESNOT REMEMBER THE NAME) Follow up.          Allergies  Allergen Reactions  . Latex Rash    Tape irritates the skin        Procedures/Studies: Ct Abdomen Wo Contrast  Result Date: 03/22/2018 CLINICAL DATA:  Nausea and vomiting and abdominal pain for several days EXAM: CT ABDOMEN WITHOUT CONTRAST TECHNIQUE: Multidetector CT imaging of the abdomen was performed following the standard protocol without IV contrast. COMPARISON:  Ultrasound from earlier in the same day FINDINGS: Lower chest: No acute abnormality. Bilateral breast implants are noted. Hepatobiliary: Diffuse fatty infiltration of the liver is noted. The gallbladder is within normal limits. Pancreas: Pancreas is well visualized although demonstrates diffuse indistinct borders with some peripancreatic inflammatory change. These changes are consistent with acute pancreatitis. Spleen: Normal in size without focal abnormality. Adrenals/Urinary Tract: Adrenal glands are unremarkable. Kidneys are normal, without renal calculi, focal lesion, or hydronephrosis. Stomach/Bowel: Some mild inflammatory changes are noted along the pericolic gutters bilaterally consistent with the known pancreatitis. The pelvis is not evaluated on this image. There is a metallic appearing foreign body identified within a loop of small bowel best seen on image number 58 of series 2. Vascular/Lymphatic: No significant vascular findings are present. No enlarged abdominal or pelvic lymph nodes. Other: No abdominal wall hernia or abnormality. Musculoskeletal: No acute or significant osseous findings. IMPRESSION: Changes consistent with acute pancreatitis with inflammatory changes identified. No focal fluid collection is seen. Metallic foreign body within the loop of small bowel likely related to ingested  content. Fatty infiltration of the liver. Electronically Signed   By: Inez Catalina M.D.   On: 03/22/2018 09:37   US Abdomen Limited  Result Date: 03/22/2018 CLINICAL DATA:  Elevated bilirubin EXAM: ULTRASOUND ABDOMEN LIMITED RIGHT UPPER QUADRANT COMPARISON:  CT abdomen 02/28/2017 FINDINGS: Gallbladder: No gallstones or wall thickening visualized. No sonographic Murphy sign noted by sonographer. Common bile duct: Diameter: 2.4 mm Liver: Diffusely echogenic liver parenchyma without focal lesion. Portal vein is patent on color Doppler imaging with normal direction of blood flow towards the liver. Negative for ascites right upper quadrant IMPRESSION: No biliary dilatation Hepatic steatosis Electronically Signed   By: Franchot Gallo M.D.   On: 03/22/2018 07:27   Dg Chest Port 1 View  Result Date: 03/22/2018 CLINICAL DATA:  Central chest pain for 2 days EXAM: PORTABLE CHEST 1 VIEW COMPARISON:  03/30/2017 FINDINGS: The heart size and mediastinal contours are within normal limits. Both lungs are clear. The visualized skeletal structures are unremarkable. Bilateral breast implants are noted. IMPRESSION: No active disease. Electronically Signed   By: Inez Catalina M.D.   On: 03/22/2018 11:13       Subjective: Reports mild lower abdominal discomfort.  Tolerating soft diet.  No nausea or vomiting.  Discharge Exam: Vitals:   03/24/18 2142 03/25/18 0418  BP: (!) 123/94 (!) 133/100  Pulse: 84 83  Resp: 16 16  Temp: 98.7 F (37.1 C) 98.4 F (36.9 C)  SpO2: 100% 98%   Vitals:  03/24/18 1300 03/24/18 2142 03/25/18 0418 03/25/18 0501  BP:  (!) 123/94 (!) 133/100   Pulse:  84 83   Resp:  16 16   Temp: 98.3 F (36.8 C) 98.7 F (37.1 C) 98.4 F (36.9 C)   TempSrc: Oral Oral Oral   SpO2:  100% 98%   Weight:    57 kg  Height:        General: Not in distress HEENT: Moist mucosa, supple neck Chest: Clear bilaterally CVs: Normal S1-S2, no murmurs GI: Soft, nondistended, mild lower abdominal  tenderness, bowel sounds present Musculoskeletal: Warm, no edema CNs: Alert and oriented, no tremors     The results of significant diagnostics from this hospitalization (including imaging, microbiology, ancillary and laboratory) are listed below for reference.     Microbiology: Recent Results (from the past 240 hour(s))  MRSA PCR Screening     Status: None   Collection Time: 03/22/18  5:24 PM  Result Value Ref Range Status   MRSA by PCR NEGATIVE NEGATIVE Final    Comment:        The GeneXpert MRSA Assay (FDA approved for NASAL specimens only), is one component of a comprehensive MRSA colonization surveillance program. It is not intended to diagnose MRSA infection nor to guide or monitor treatment for MRSA infections. Performed at [facility], [place] [address] [place], [place] [address]      Labs: BNP (last 3 results) No results for input(s): BNP in the last 8760 hours. Basic Metabolic Panel: Recent Labs  Lab 03/22/18 0331 03/22/18 0551 03/23/18 0405 03/24/18 0326 03/24/18 1359  NA 139  --  136 140  --   K 5.2* 4.4 3.6 3.2*  --   CL 98  --  99 104  --   CO2 8*  --  26 27  --   GLUCOSE 126*  --  110* 106*  --   BUN 12  --  11 9  --   CREATININE 1.55*  --  0.80 0.71 0.80  CALCIUM 9.3  --  8.2* 8.7*  --    Liver Function Tests: Recent Labs  Lab 03/22/18 0331 03/23/18 0405 03/24/18 0326  AST 314* 133* 183*  ALT 151* 79* 84*  ALKPHOS 77 45 68  BILITOT 3.4* 2.7* 2.2*  PROT 8.5* 5.4* 5.6*  ALBUMIN 4.7 3.0* 3.2*   Recent Labs  Lab 03/22/18 0331 03/24/18 0326  LIPASE 814* 115*   No results for input(s): AMMONIA in the last 168 hours. CBC: Recent Labs  Lab 03/22/18 0331 03/23/18 0405 03/24/18 1359  WBC 14.0* 7.9 5.7  NEUTROABS 11.3*  --   --   HGB 11.9* 9.0* 9.2*  HCT 41.9 30.2* 30.9*  MCV 91.7 88.0 89.3  PLT 390 215 228   Cardiac Enzymes: No results for input(s): CKTOTAL, CKMB, CKMBINDEX, TROPONINI in the last 168  hours. BNP: Invalid input(s): POCBNP CBG: No results for input(s): GLUCAP in the last 168 hours. D-Dimer No results for input(s): DDIMER in the last 72 hours. Hgb A1c No results for input(s): HGBA1C in the last 72 hours. Lipid Profile No results for input(s): CHOL, HDL, LDLCALC, TRIG, CHOLHDL, LDLDIRECT in the last 72 hours. Thyroid function studies No results for input(s): TSH, T4TOTAL, T3FREE, THYROIDAB in the last 72 hours.  Invalid input(s): FREET3 Anemia work up No results for input(s): VITAMINB12, FOLATE, FERRITIN, TIBC, IRON, RETICCTPCT in the last 72 hours. Urinalysis    Component Value Date/Time   COLORURINE AMBER (A) 03/22/2018 0530  APPEARANCEUR CLOUDY (A) 03/22/2018 0530   LABSPEC 1.018 03/22/2018 0530   PHURINE 5.0 03/22/2018 0530   GLUCOSEU NEGATIVE 03/22/2018 0530   HGBUR SMALL (A) 03/22/2018 0530   BILIRUBINUR NEGATIVE 03/22/2018 0530   KETONESUR 80 (A) 03/22/2018 0530   PROTEINUR 100 (A) 03/22/2018 0530   NITRITE NEGATIVE 03/22/2018 0530   LEUKOCYTESUR NEGATIVE 03/22/2018 0530   Sepsis Labs Invalid input(s): PROCALCITONIN,  WBC,  LACTICIDVEN Microbiology Recent Results (from the past 240 hour(s))  MRSA PCR Screening     Status: None   Collection Time: 03/22/18  5:24 PM  Result Value Ref Range Status   MRSA by PCR NEGATIVE NEGATIVE Final    Comment:        The GeneXpert MRSA Assay (FDA approved for NASAL specimens only), is one component of a comprehensive MRSA colonization surveillance program. It is not intended to diagnose MRSA infection nor to guide or monitor treatment for MRSA infections. Performed at [facility], [place] [address] [place], [place] [address]      Time coordinating discharge: 35 minutes  SIGNED:   Louellen Molder, MD  [facility] 03/25/2018, 12:23 PM Pager   If 7PM-7AM, please contact night-coverage [email or web address] Password [email or web address]

## 2018-03-25 NOTE — Discharge Instructions (Signed)
Acute Pancreatitis Acute pancreatitis is a condition in which the pancreas suddenly gets irritated and swollen (has inflammation). The pancreas is a large gland behind the stomach. It makes enzymes that help to digest food. The pancreas also makes hormones that help to control your blood sugar. Acute pancreatitis happens when the enzymes attack the pancreas and damage it. Most attacks last a couple of days and can cause serious problems. Follow these instructions at home: Eating and drinking  Follow instructions from your doctor about diet. You may need to: ? Avoid alcohol. ? Limit how much fat is in your diet.  Eat small meals often. Avoid eating big meals.  Drink enough fluid to keep your pee (urine) clear or pale yellow.  Do not drink alcohol if it caused your condition. General instructions  Take over-the-counter and prescription medicines only as told by your doctor.  Do not use any tobacco products. These include cigarettes, chewing tobacco, and e-cigarettes. If you need help quitting, ask your doctor.  Get plenty of rest.  If directed, check your blood sugar at home as told by your doctor.  Keep all follow-up visits as told by your doctor. This is important. Contact a doctor if:  You do not get better as quickly as expected.  You have new symptoms.  Your symptoms get worse.  You have lasting pain or weakness.  You continue to feel sick to your stomach (nauseous).  You get better and then you have another pain attack.  You have a fever. Get help right away if:  You cannot eat or keep fluids down.  Your pain becomes very bad.  Your skin or the white part of your eyes turns yellow (jaundice).  You throw up (vomit).  You feel dizzy or you pass out (faint).  Your blood sugar is high (over 300 mg/dL). This information is not intended to replace advice given to you by your health care provider. Make sure you discuss any questions you have with your health care  provider. Document Released: 11/13/2007 Document Revised: 11/02/2015 Document Reviewed: 02/28/2015 Elsevier Interactive Patient Education  2018 [employer].    Alcohol Use Disorder Alcohol use disorder is when your drinking disrupts your daily life. When you have this condition, you drink too much alcohol and you cannot control your drinking. Alcohol use disorder can cause serious problems with your physical health. It can affect your brain, heart, liver, pancreas, immune system, stomach, and intestines. Alcohol use disorder can increase your risk for certain cancers and cause problems with your mental health, such as depression, anxiety, psychosis, delirium, and dementia. People with this disorder risk hurting themselves and others. What are the causes? This condition is caused by drinking too much alcohol over time. It is not caused by drinking too much alcohol only one or two times. Some people with this condition drink alcohol to cope with or escape from negative life events. Others drink to relieve pain or symptoms of mental illness. What increases the risk? You are more likely to develop this condition if:  You have a family history of alcohol use disorder.  Your culture encourages drinking to the point of intoxication, or makes alcohol easy to get.  You had a mood or conduct disorder in childhood.  You have been a victim of abuse.  You are an adolescent and: ? You have poor grades or difficulties in school. ? Your caregivers do not talk to you about saying no to alcohol, or supervise your activities. ? You are  impulsive or you have trouble with self-control.  What are the signs or symptoms? Symptoms of this condition include:  Drinkingmore than you want to.  Drinking for longer than you want to.  Trying several times to drink less or to control your drinking.  Spending a lot of time getting alcohol, drinking, or recovering from drinking.  Craving alcohol.  Having  problems at work, at school, or at home due to drinking.  Having problems in relationships due to drinking.  Drinking when it is dangerous to drink, such as before driving a car.  Continuing to drink even though you know you might have a physical or mental problem related to drinking.  Needing more and more alcohol to get the same effect you want from the alcohol (building up tolerance).  Having symptoms of withdrawal when you stop drinking. Symptoms of withdrawal include: ? Fatigue. ? Nightmares. ? Trouble sleeping. ? Depression. ? Anxiety. ? Fever. ? Seizures. ? Severe confusion. ? Feeling or seeing things that are not there (hallucinations). ? Tremors. ? Rapid heart rate. ? Rapid breathing. ? High blood pressure.  Drinking to avoid symptoms of withdrawal.  How is this diagnosed? This condition is diagnosed with an assessment. Your health care provider may start the assessment by asking three or four questions about your drinking. Your health care provider may perform a physical exam or do lab tests to see if you have physical problems resulting from alcohol use. She or he may refer you to a mental health professional for evaluation. How is this treated? Some people with alcohol use disorder are able to reduce their alcohol use to low-risk levels. Others need to completely quit drinking alcohol. When necessary, mental health professionals with specialized training in substance use treatment can help. Your health care provider can help you decide how severe your alcohol use disorder is and what type of treatment you need. The following forms of treatment are available:  Detoxification. Detoxification involves quitting drinking and using prescription medicines within the first week to help lessen withdrawal symptoms. This treatment is important for people who have had withdrawal symptoms before and for heavy drinkers who are likely to have withdrawal symptoms. Alcohol withdrawal can  be dangerous, and in severe cases, it can cause death. Detoxification may be provided in a home, community, or primary care setting, or in a hospital or substance use treatment facility.  Counseling. This treatment is also called talk therapy. It is provided by substance use treatment counselors. A counselor can address the reasons you use alcohol and suggest ways to keep you from drinking again or to prevent problem drinking. The goals of talk therapy are to: ? Find healthy activities and ways for you to cope with stress. ? Identify and avoid the things that trigger your alcohol use. ? Help you learn how to handle cravings.  Medicines.Medicines can help treat alcohol use disorder by: ? Decreasing alcohol cravings. ? Decreasing the positive feeling you have when you drink alcohol. ? Causing an uncomfortable physical reaction when you drink alcohol (aversion therapy).  Support groups. Support groups are led by people who have quit drinking. They provide emotional support, advice, and guidance.  These forms of treatment are often combined. Some people with this condition benefit from a combination of treatments provided by specialized substance use treatment centers. Follow these instructions at home:  Take over-the-counter and prescription medicines only as told by your health care provider.  Check with your health care provider before starting any new medicines.  Ask friends and family members not to offer you alcohol.  Avoid situations where alcohol is served, including gatherings where others are drinking alcohol.  Create a plan for what to do when you are tempted to use alcohol.  Find hobbies or activities that you enjoy that do not include alcohol.  Keep all follow-up visits as told by your health care provider. This is important. How is this prevented?  If you drink, limit alcohol intake to no more than 1 drink a day for nonpregnant women and 2 drinks a day for men. One drink  equals 12 oz of beer, 5 oz of wine, or 1 oz of hard liquor.  If you have a mental health condition, get treatment and support.  Do not give alcohol to adolescents.  If you are an adolescent: ? Do not drink alcohol. ? Do not be afraid to say no if someone offers you alcohol. Speak up about why you do not want to drink. You can be a positive role model for your friends and set a good example for those around you by not drinking alcohol. ? If your friends drink, spend time with others who do not drink alcohol. Make new friends who do not use alcohol. ? Find healthy ways to manage stress and emotions, such as meditation or deep breathing, exercise, spending time in nature, listening to music, or talking with a trusted friend or family member. Contact a health care provider if:  You are not able to take your medicines as told.  Your symptoms get worse.  You return to drinking alcohol (relapse) and your symptoms get worse. Get help right away if:  You have thoughts about hurting yourself or others. If you ever feel like you may hurt yourself or others, or have thoughts about taking your own life, get help right away. You can go to your nearest emergency department or call:  Your local emergency services (911 in the [place]  A suicide crisis helpline, such as the [place] at 501 404 0061. This is open 24 hours a day.  Summary  Alcohol use disorder is when your drinking disrupts your daily life. When you have this condition, you drink too much alcohol and you cannot control your drinking.  Treatment may include detoxification, counseling, medicine, and support groups.  Ask friends and family members not to offer you alcohol. Avoid situations where alcohol is served.  Get help right away if you have thoughts about hurting yourself or others. This information is not intended to replace advice given to you by your health care provider. Make sure you discuss any  questions you have with your health care provider. Document Released: 07/04/2004 Document Revised: 02/22/2016 Document Reviewed: 02/22/2016 Elsevier Interactive Patient Education  [employer].

## 2018-05-02 ENCOUNTER — Other Ambulatory Visit: Payer: Self-pay

## 2018-05-02 ENCOUNTER — Emergency Department (HOSPITAL_COMMUNITY): Payer: Self-pay

## 2018-05-02 ENCOUNTER — Emergency Department (HOSPITAL_COMMUNITY)
Admission: EM | Admit: 2018-05-02 | Discharge: 2018-05-02 | Disposition: A | Discharge status: Home / Self Care | Payer: Self-pay | Attending: Emergency Medicine | Admitting: Emergency Medicine

## 2018-05-02 DIAGNOSIS — E876 Hypokalemia: Secondary | ICD-10-CM | POA: Insufficient documentation

## 2018-05-02 DIAGNOSIS — G510 Bell's palsy: Secondary | ICD-10-CM

## 2018-05-02 DIAGNOSIS — Z87891 Personal history of nicotine dependence: Secondary | ICD-10-CM | POA: Insufficient documentation

## 2018-05-02 DIAGNOSIS — R945 Abnormal results of liver function studies: Secondary | ICD-10-CM | POA: Insufficient documentation

## 2018-05-02 DIAGNOSIS — R29810 Facial weakness: Secondary | ICD-10-CM | POA: Insufficient documentation

## 2018-05-02 DIAGNOSIS — Z79899 Other long term (current) drug therapy: Secondary | ICD-10-CM | POA: Insufficient documentation

## 2018-05-02 DIAGNOSIS — N390 Urinary tract infection, site not specified: Secondary | ICD-10-CM | POA: Insufficient documentation

## 2018-05-02 LAB — APTT: APTT: 31 s (ref 24–36)

## 2018-05-02 LAB — COMPREHENSIVE METABOLIC PANEL
ALK PHOS: 101 U/L (ref 38–126)
ALT: 100 U/L — AB (ref 0–44)
ANION GAP: 12 (ref 5–15)
AST: 379 U/L — ABNORMAL HIGH (ref 15–41)
Albumin: 3.5 g/dL (ref 3.5–5.0)
BUN: <5 mg/dL — ABNORMAL LOW (ref 6–20)
CALCIUM: 8.5 mg/dL — AB (ref 8.9–10.3)
CHLORIDE: 106 mmol/L (ref 98–111)
CO2: 25 mmol/L (ref 22–32)
CREATININE: 0.68 mg/dL (ref 0.44–1.00)
GFR calc non Af Amer: >60 mL/min (ref >60)
Glucose, Bld: 115 mg/dL — ABNORMAL HIGH (ref 70–99)
Potassium: 3 mmol/L — ABNORMAL LOW (ref 3.5–5.1)
Sodium: 143 mmol/L (ref 135–145)
Total Bilirubin: 1 mg/dL (ref 0.3–1.2)
Total Protein: 7 g/dL (ref 6.5–8.1)

## 2018-05-02 LAB — CBC
HCT: 37.5 % (ref 36.0–46.0)
HEMOGLOBIN: 11.6 g/dL — AB (ref 12.0–15.0)
MCH: 27.6 pg (ref 26.0–34.0)
MCHC: 30.9 g/dL (ref 30.0–36.0)
MCV: 89.3 fL (ref 80.0–100.0)
PLATELETS: 265 10*3/uL (ref 150–400)
RBC: 4.2 MIL/uL (ref 3.87–5.11)
RDW: 19 % — AB (ref 11.5–15.5)
WBC: 5 10*3/uL (ref 4.0–10.5)
nRBC: 0 % (ref 0.0–0.2)

## 2018-05-02 LAB — DIFFERENTIAL
Abs Immature Granulocytes: 0.01 10*3/uL (ref 0.00–0.07)
BASOS ABS: 0.1 10*3/uL (ref 0.0–0.1)
BASOS PCT: 3 %
EOS PCT: 1 %
Eosinophils Absolute: 0 10*3/uL (ref 0.0–0.5)
Immature Granulocytes: 0 %
Lymphocytes Relative: 55 %
Lymphs Abs: 2.8 10*3/uL (ref 0.7–4.0)
MONO ABS: 0.6 10*3/uL (ref 0.1–1.0)
MONOS PCT: 12 %
NEUTROS ABS: 1.5 10*3/uL — AB (ref 1.7–7.7)
NEUTROS PCT: 29 %

## 2018-05-02 LAB — URINALYSIS, ROUTINE W REFLEX MICROSCOPIC
Bilirubin Urine: NEGATIVE
Glucose, UA: NEGATIVE mg/dL
Hgb urine dipstick: NEGATIVE
KETONES UR: NEGATIVE mg/dL
Nitrite: POSITIVE — AB
PH: 7 (ref 5.0–8.0)
Protein, ur: NEGATIVE mg/dL
SPECIFIC GRAVITY, URINE: 1.003 — AB (ref 1.005–1.030)

## 2018-05-02 LAB — I-STAT BETA HCG BLOOD, ED (MC, WL, AP ONLY): I-stat hCG, quantitative: <5 m[IU]/mL (ref <5)

## 2018-05-02 LAB — PROTIME-INR
INR: 1.18
PROTHROMBIN TIME: 14.8 s (ref 11.4–15.2)

## 2018-05-02 LAB — RAPID URINE DRUG SCREEN, HOSP PERFORMED
Amphetamines: NOT DETECTED
BARBITURATES: NOT DETECTED
Benzodiazepines: NOT DETECTED
COCAINE: NOT DETECTED
OPIATES: NOT DETECTED
Tetrahydrocannabinol: NOT DETECTED

## 2018-05-02 LAB — I-STAT TROPONIN, ED: TROPONIN I, POC: 0 ng/mL (ref 0.00–0.08)

## 2018-05-02 MED ORDER — PREDNISONE 20 MG PO TABS
40.0000 mg | ORAL_TABLET | ORAL | Status: AC
  Administered 2018-05-02: 40 mg via ORAL
  Filled 2018-05-02: qty 2

## 2018-05-02 MED ORDER — LORAZEPAM 2 MG/ML IJ SOLN
0.5000 mg | Freq: Once | INTRAMUSCULAR | Status: AC
  Administered 2018-05-02: 0.5 mg via INTRAVENOUS
  Filled 2018-05-02: qty 1

## 2018-05-02 MED ORDER — PREDNISONE 20 MG PO TABS: 40.0000 mg | ORAL_TABLET | Freq: Every day | ORAL | 0 refills | Status: DC

## 2018-05-02 MED ORDER — ACYCLOVIR 200 MG PO CAPS
400.0000 mg | ORAL_CAPSULE | Freq: Once | ORAL | Status: AC
  Administered 2018-05-02: 400 mg via ORAL
  Filled 2018-05-02: qty 2

## 2018-05-02 MED ORDER — ACYCLOVIR 200 MG PO CAPS: 800.0000 mg | ORAL_CAPSULE | Freq: Once | ORAL | Status: DC

## 2018-05-02 MED ORDER — SODIUM CHLORIDE 0.9 % IV SOLN
INTRAVENOUS | Status: DC
  Administered 2018-05-02: 18:00:00 via INTRAVENOUS

## 2018-05-02 MED ORDER — POTASSIUM CHLORIDE CRYS ER 20 MEQ PO TBCR
40.0000 meq | EXTENDED_RELEASE_TABLET | Freq: Once | ORAL | Status: AC
  Administered 2018-05-02: 40 meq via ORAL
  Filled 2018-05-02: qty 2

## 2018-05-02 MED ORDER — FOSFOMYCIN TROMETHAMINE 3 G PO PACK
3.0000 g | PACK | Freq: Once | ORAL | Status: AC
  Administered 2018-05-02: 3 g via ORAL
  Filled 2018-05-02: qty 3

## 2018-05-02 MED ORDER — ACYCLOVIR 400 MG PO TABS: 400.0000 mg | ORAL_TABLET | Freq: Every day | ORAL | 0 refills | Status: DC

## 2018-05-02 NOTE — ED Triage Notes (Signed)
Pt to ED for evaluation of sensation change on face. Pt A/O x4, slight facial assemmetry when smiling to L. Denies headache. Pt hypertensive 142/100

## 2018-05-02 NOTE — ED Notes (Signed)
Patient transported to CT 

## 2018-05-02 NOTE — ED Provider Notes (Signed)
[facility] EMERGENCY DEPARTMENT Provider Note   CSN: [id number] Arrival date & time: 05/02/18  1647     History   Chief Complaint Chief Complaint  Patient presents with  . facial change    HPI Susan [name] is a 37 y.o. female.  HPI  Patient presents with a female companion who assists with the HPI. Patient presents about 1 hour after onset of left facial numbness. Speech is slow as well, feels abnormal to the patient, and though she describes some change in vision, she states that she sees fine.  She denies extremity weakness, denies complaints in the rest of her body, including pain, discomfort, weakness. She was well prior to the onset and since onset symptoms have been persistent, with persistent numbness, tingling of the left face, hesitancy to speak secondary to feeling as though her speech is poor. Patient acknowledges multiple medical issues, but no recent issues. She drinks socially, no drug use.  Past Medical History:  Diagnosis Date  . Alcohol abuse   . Depression   . Gastritis   . GIB (gastrointestinal bleeding)   . Iron deficiency anemia   . Pancreatitis     Patient Active Problem List   Diagnosis Date Noted  . Alcohol-induced pancreatitis 03/25/2018  . Foreign body alimentary tract, subsequent encounter 03/25/2018  . AKI (acute kidney injury) ([place]) 03/25/2018  . Alcoholic hepatitis without ascites 03/25/2018  . Acute pancreatitis 03/22/2018  . Alcohol withdrawal ([place]) 03/31/2017  . Hypomagnesemia 03/31/2017  . Abnormal LFTs 03/31/2017  . Metabolic acidosis 26/71/2458  . Abdominal pain 03/31/2017  . Gastritis   . Depression   . Iron deficiency anemia   . GI bleed 09/05/2016  . Nausea & vomiting 06/04/2016  . Sepsis ([place]) 06/04/2016  . Hyperglycemia 06/04/2016  . DTs (delirium tremens) ([place]) 06/04/2016    Past Surgical History:  Procedure Laterality Date  . ESOPHAGOGASTRODUODENOSCOPY (EGD) WITH PROPOFOL N/A 06/05/2016   Procedure: ESOPHAGOGASTRODUODENOSCOPY (EGD) WITH PROPOFOL;  Surgeon: Otis Brace, MD;  Location: [facility];  Service: Gastroenterology;  Laterality: N/A;     OB History   None      Home Medications    Prior to Admission medications   Medication Sig Start Date End Date Taking? Authorizing Provider  Cyanocobalamin (B-12) 1000 MCG TABS Take 1,000 mg by mouth daily.    [provider]  famotidine (PEPCID) 10 MG tablet Take 10 mg by mouth 2 (two) times daily.    [provider]  feeding supplement (BOOST HIGH PROTEIN) LIQD Take 237 mLs by mouth 3 (three) times daily between meals. 03/25/18   Dhungel, Nishant, MD  ferrous sulfate 325 (65 FE) MG tablet Take 325 mg by mouth daily with breakfast.    [provider]  Multiple Vitamins-Minerals (MULTIVITAMIN ADULT) CHEW Chew 1 tablet by mouth daily.    [provider]  polyethylene glycol (MIRALAX / GLYCOLAX) packet Take 17 g by mouth daily. 03/25/18   Dhungel, Flonnie Overman, MD  venlafaxine XR (EFFEXOR-XR) 37.5 MG 24 hr capsule Take 37.5-75 mg by mouth at bedtime. 03/16/18   [provider]    Family History Family History  Problem Relation Age of Onset  . Stroke Mother     Social History Social History   Tobacco Use  . Smoking status: Former Research scientist (life sciences)  . Smokeless tobacco: Never Used  Substance Use Topics  . Alcohol use: Yes    Comment: occasional  . Drug use: No     Allergies   Latex  Review of Systems Review of Systems  Constitutional:       Per HPI, otherwise negative  HENT:       Per HPI, otherwise negative  Respiratory:       Per HPI, otherwise negative  Cardiovascular:       Per HPI, otherwise negative  Gastrointestinal: Negative for vomiting.  Endocrine:       Negative aside from HPI  Genitourinary:       Neg aside from HPI   Musculoskeletal:       Per HPI, otherwise negative  Skin: Negative.   Neurological: Positive for speech difficulty and numbness. Negative  for syncope.  Psychiatric/Behavioral: The patient is nervous/anxious.      Physical Exam Updated Vital Signs BP 118/86   Pulse 80   Resp 17   Ht 5' 2"  (1.575 m)   Wt 54.4 kg   LMP 04/12/2017 (Exact Date)   SpO2 100%   BMI 21.95 kg/m   Physical Exam  Constitutional: She is oriented to person, place, and time. She appears well-developed and well-nourished. No distress.  HENT:  Head: Normocephalic and atraumatic.  Eyes: Conjunctivae and EOM are normal.  Cardiovascular: Normal rate and regular rhythm.  Pulmonary/Chest: Effort normal and breath sounds normal. No stridor. No respiratory distress.  Abdominal: She exhibits no distension.  Musculoskeletal: She exhibits no edema.  Neurological: She is alert and oriented to person, place, and time. No cranial nerve deficit.  Patient has preserved sensation in all branches of the facial nerve, has no appreciable facial asymmetry until she smiles, when she has mild left lip deviation. Speech is brief, halting, appropriate, not slurred. Extraocular motion intact, pupils are equal round and reactive.   Skin: Skin is warm and dry.  Psychiatric: Her mood appears anxious. She is slowed and withdrawn.  Nursing note and vitals reviewed.    ED Treatments / Results  Labs (all labs ordered are listed, but only abnormal results are displayed) Labs Reviewed  CBC - Abnormal; Notable for the following components:      Result Value   Hemoglobin 11.6 (*)    RDW 19.0 (*)    All other components within normal limits  DIFFERENTIAL - Abnormal; Notable for the following components:   Neutro Abs 1.5 (*)    All other components within normal limits  COMPREHENSIVE METABOLIC PANEL - Abnormal; Notable for the following components:   Potassium 3.0 (*)    Glucose, Bld 115 (*)    BUN <5 (*)    Calcium 8.5 (*)    AST 379 (*)    ALT 100 (*)    All other components within normal limits  URINALYSIS, ROUTINE W REFLEX MICROSCOPIC - Abnormal; Notable for the  following components:   Specific Gravity, Urine 1.003 (*)    Nitrite POSITIVE (*)    Leukocytes, UA TRACE (*)    Bacteria, UA FEW (*)    All other components within normal limits  PROTIME-INR  APTT  RAPID URINE DRUG SCREEN, HOSP PERFORMED  I-STAT TROPONIN, ED  I-STAT BETA HCG BLOOD, ED ([facility], [facility], [facility] ONLY)    EKG EKG Interpretation  Date/Time:  Saturday May 02 2018 17:05:24 EST Ventricular Rate:  78 PR Interval:    QRS Duration: 108 QT Interval:  444 QTC Calculation: 506 R Axis:   84 Text Interpretation:  Sinus rhythm Nonspecific T abnormalities, anterior leads Borderline prolonged QT interval Baseline wander in lead(s) II III aVF V3 Abnormal ekg Confirmed by Carmin Muskrat [id number]) on 05/02/2018 5:09:03  PM   Radiology Ct Head Wo Contrast  Result Date: 05/02/2018 CLINICAL DATA:  37 year old female with history of numbness in the right-side of the face and arm for the past 24 hours ago. EXAM: CT HEAD WITHOUT CONTRAST TECHNIQUE: Contiguous axial images were obtained from the base of the skull through the vertex without intravenous contrast. COMPARISON:  None. FINDINGS: Brain: No evidence of acute infarction, hemorrhage, hydrocephalus, extra-axial collection or mass lesion/mass effect. Vascular: No hyperdense vessel or unexpected calcification. Skull: Normal. Negative for fracture or focal lesion. Sinuses/Orbits: No acute finding. Other: None. IMPRESSION: 1. No acute intracranial abnormalities. The appearance of the brain is normal. Electronically Signed   By: Vinnie Langton M.D.   On: 05/02/2018 18:31    Procedures Procedures (including critical care time)  Medications Ordered in ED Medications  0.9 %  sodium chloride infusion ( Intravenous New Bag/Given 05/02/18 1752)  LORazepam (ATIVAN) injection 0.5 mg (0.5 mg Intravenous Given 05/02/18 1753)     Initial Impression / Assessment and Plan / ED Course  I have reviewed the triage vital signs and the nursing  notes.  Pertinent labs & imaging results that were available during my care of the patient were reviewed by me and considered in my medical decision making (see chart for details).     7:45 PM Vision in no distress, awake, alert, states that she feels better. She states that she has minimal dysesthesia on her left face, no appreciable facial droop. We discussed all findings, including evidence for UTI, though she is asymptomatic, evidence for hypokalemia. With concern for Bell's palsy, patient will start appropriate therapy, will follow up with primary care. No evidence for stroke.  We also discussed the patient's increasing liver function panel, notable after recent admission for pancreatitis, with decrease in values during her hospitalization. She acknowledges returning to alcohol consumption, states that she will minimize consumption, follow-up with primary care in 1 week for repeat labs, repeat evaluation.   Final Clinical Impressions(s) / ED Diagnoses  Facial droop UTI Hypokalemia Elevated liver function panel   Carmin Muskrat, MD 05/02/18 1948

## 2018-05-02 NOTE — Discharge Instructions (Signed)
As discussed, your evaluation today has been largely reassuring.  But, it is important that you monitor your condition carefully, and do not hesitate to return to the ED if you develop new, or concerning changes in your condition.  Otherwise, please follow-up with your physician for appropriate ongoing care.  Be sure to discuss today's evaluation, and of the concern for an increase in your liver function enzymes, as well as the facial changes, concerning for Bell's palsy.

## 2018-05-04 ENCOUNTER — Inpatient Hospital Stay (HOSPITAL_COMMUNITY): Payer: Self-pay

## 2018-05-04 ENCOUNTER — Other Ambulatory Visit: Payer: Self-pay

## 2018-05-04 ENCOUNTER — Inpatient Hospital Stay (HOSPITAL_COMMUNITY)
Admission: EM | Admit: 2018-05-04 | Discharge: 2018-05-07 | DRG: 433 | Disposition: A | Discharge status: Home / Self Care | Payer: Self-pay | Attending: Family Medicine | Admitting: Family Medicine

## 2018-05-04 DIAGNOSIS — Z91040 Latex allergy status: Secondary | ICD-10-CM

## 2018-05-04 DIAGNOSIS — R7989 Other specified abnormal findings of blood chemistry: Secondary | ICD-10-CM | POA: Diagnosis present

## 2018-05-04 DIAGNOSIS — R402143 Coma scale, eyes open, spontaneous, at hospital admission: Secondary | ICD-10-CM | POA: Diagnosis present

## 2018-05-04 DIAGNOSIS — K7010 Alcoholic hepatitis without ascites: Principal | ICD-10-CM | POA: Diagnosis present

## 2018-05-04 DIAGNOSIS — Z8669 Personal history of other diseases of the nervous system and sense organs: Secondary | ICD-10-CM

## 2018-05-04 DIAGNOSIS — E876 Hypokalemia: Secondary | ICD-10-CM | POA: Diagnosis present

## 2018-05-04 DIAGNOSIS — K852 Alcohol induced acute pancreatitis without necrosis or infection: Secondary | ICD-10-CM | POA: Diagnosis present

## 2018-05-04 DIAGNOSIS — F10231 Alcohol dependence with withdrawal delirium: Secondary | ICD-10-CM | POA: Diagnosis present

## 2018-05-04 DIAGNOSIS — R945 Abnormal results of liver function studies: Secondary | ICD-10-CM | POA: Diagnosis present

## 2018-05-04 DIAGNOSIS — Z79899 Other long term (current) drug therapy: Secondary | ICD-10-CM

## 2018-05-04 DIAGNOSIS — R402363 Coma scale, best motor response, obeys commands, at hospital admission: Secondary | ICD-10-CM | POA: Diagnosis present

## 2018-05-04 DIAGNOSIS — K860 Alcohol-induced chronic pancreatitis: Secondary | ICD-10-CM | POA: Diagnosis present

## 2018-05-04 DIAGNOSIS — R8281 Pyuria: Secondary | ICD-10-CM | POA: Diagnosis present

## 2018-05-04 DIAGNOSIS — Y900 Blood alcohol level of less than 20 mg/100 ml: Secondary | ICD-10-CM | POA: Diagnosis present

## 2018-05-04 DIAGNOSIS — K2970 Gastritis, unspecified, without bleeding: Secondary | ICD-10-CM | POA: Diagnosis present

## 2018-05-04 DIAGNOSIS — F10931 Alcohol use, unspecified with withdrawal delirium: Secondary | ICD-10-CM | POA: Diagnosis present

## 2018-05-04 DIAGNOSIS — D509 Iron deficiency anemia, unspecified: Secondary | ICD-10-CM | POA: Diagnosis present

## 2018-05-04 DIAGNOSIS — K8681 Exocrine pancreatic insufficiency: Secondary | ICD-10-CM | POA: Diagnosis present

## 2018-05-04 DIAGNOSIS — G510 Bell's palsy: Secondary | ICD-10-CM | POA: Diagnosis present

## 2018-05-04 DIAGNOSIS — K859 Acute pancreatitis without necrosis or infection, unspecified: Secondary | ICD-10-CM | POA: Diagnosis present

## 2018-05-04 DIAGNOSIS — Z888 Allergy status to other drugs, medicaments and biological substances status: Secondary | ICD-10-CM

## 2018-05-04 DIAGNOSIS — Z8719 Personal history of other diseases of the digestive system: Secondary | ICD-10-CM

## 2018-05-04 DIAGNOSIS — R402253 Coma scale, best verbal response, oriented, at hospital admission: Secondary | ICD-10-CM | POA: Diagnosis present

## 2018-05-04 DIAGNOSIS — R112 Nausea with vomiting, unspecified: Secondary | ICD-10-CM | POA: Diagnosis present

## 2018-05-04 DIAGNOSIS — F329 Major depressive disorder, single episode, unspecified: Secondary | ICD-10-CM | POA: Diagnosis present

## 2018-05-04 DIAGNOSIS — Z91048 Other nonmedicinal substance allergy status: Secondary | ICD-10-CM

## 2018-05-04 DIAGNOSIS — Z87891 Personal history of nicotine dependence: Secondary | ICD-10-CM

## 2018-05-04 HISTORY — DX: Family history of other specified conditions: Z84.89

## 2018-05-04 LAB — CBC WITH DIFFERENTIAL/PLATELET
ABS IMMATURE GRANULOCYTES: 0.03 10*3/uL (ref 0.00–0.07)
BASOS ABS: 0.1 10*3/uL (ref 0.0–0.1)
Basophils Relative: 1 %
Eosinophils Absolute: 0.1 10*3/uL (ref 0.0–0.5)
Eosinophils Relative: 1 %
HEMATOCRIT: 34.7 % — AB (ref 36.0–46.0)
Hemoglobin: 10.4 g/dL — ABNORMAL LOW (ref 12.0–15.0)
Immature Granulocytes: 1 %
LYMPHS ABS: 0.9 10*3/uL (ref 0.7–4.0)
LYMPHS PCT: 15 %
MCH: 27.2 pg (ref 26.0–34.0)
MCHC: 30 g/dL (ref 30.0–36.0)
MCV: 90.6 fL (ref 80.0–100.0)
Monocytes Absolute: 0.4 10*3/uL (ref 0.1–1.0)
Monocytes Relative: 6 %
NEUTROS ABS: 4.5 10*3/uL (ref 1.7–7.7)
Neutrophils Relative %: 76 %
Platelets: 277 10*3/uL (ref 150–400)
RBC: 3.83 MIL/uL — ABNORMAL LOW (ref 3.87–5.11)
RDW: 18.7 % — ABNORMAL HIGH (ref 11.5–15.5)
WBC: 5.9 10*3/uL (ref 4.0–10.5)
nRBC: 0 % (ref 0.0–0.2)

## 2018-05-04 LAB — ETHANOL: Alcohol, Ethyl (B): <10 mg/dL (ref <10)

## 2018-05-04 LAB — URINALYSIS, ROUTINE W REFLEX MICROSCOPIC
BILIRUBIN URINE: NEGATIVE
GLUCOSE, UA: 50 mg/dL — AB
HGB URINE DIPSTICK: NEGATIVE
Ketones, ur: 80 mg/dL — AB
LEUKOCYTES UA: NEGATIVE
NITRITE: NEGATIVE
PH: 6 (ref 5.0–8.0)
Protein, ur: 30 mg/dL — AB
Specific Gravity, Urine: 1.025 (ref 1.005–1.030)

## 2018-05-04 LAB — COMPREHENSIVE METABOLIC PANEL
ALBUMIN: 3.6 g/dL (ref 3.5–5.0)
ALK PHOS: 101 U/L (ref 38–126)
ALT: 126 U/L — AB (ref 0–44)
ANION GAP: 17 — AB (ref 5–15)
AST: 485 U/L — AB (ref 15–41)
BILIRUBIN TOTAL: 1.6 mg/dL — AB (ref 0.3–1.2)
BUN: 7 mg/dL (ref 6–20)
CHLORIDE: 107 mmol/L (ref 98–111)
CO2: 19 mmol/L — AB (ref 22–32)
CREATININE: 0.84 mg/dL (ref 0.44–1.00)
Calcium: 8.7 mg/dL — ABNORMAL LOW (ref 8.9–10.3)
GFR calc non Af Amer: >60 mL/min (ref >60)
GLUCOSE: 184 mg/dL — AB (ref 70–99)
Potassium: 4.3 mmol/L (ref 3.5–5.1)
SODIUM: 143 mmol/L (ref 135–145)
Total Protein: 7 g/dL (ref 6.5–8.1)

## 2018-05-04 LAB — PROTIME-INR
INR: 1.35
PROTHROMBIN TIME: 16.5 s — AB (ref 11.4–15.2)

## 2018-05-04 LAB — HCG, QUANTITATIVE, PREGNANCY

## 2018-05-04 LAB — I-STAT BETA HCG BLOOD, ED (MC, WL, AP ONLY): I-stat hCG, quantitative: <5 m[IU]/mL (ref <5)

## 2018-05-04 LAB — LIPASE, BLOOD: Lipase: 43 U/L (ref 11–51)

## 2018-05-04 MED ORDER — PROMETHAZINE HCL 25 MG/ML IJ SOLN
25.0000 mg | Freq: Four times a day (QID) | INTRAMUSCULAR | Status: DC | PRN
  Administered 2018-05-05: 25 mg via INTRAVENOUS
  Filled 2018-05-04: qty 1

## 2018-05-04 MED ORDER — LORAZEPAM 2 MG/ML IJ SOLN: 0.0000 mg | Freq: Two times a day (BID) | INTRAMUSCULAR | Status: DC

## 2018-05-04 MED ORDER — SODIUM CHLORIDE 0.9% FLUSH
3.0000 mL | Freq: Two times a day (BID) | INTRAVENOUS | Status: DC
  Administered 2018-05-05 – 2018-05-06 (×4): 3 mL via INTRAVENOUS

## 2018-05-04 MED ORDER — PREDNISOLONE 5 MG PO TABS
40.0000 mg | ORAL_TABLET | Freq: Every day | ORAL | Status: DC
  Administered 2018-05-04 – 2018-05-05 (×2): 40 mg via ORAL
  Filled 2018-05-04 (×2): qty 8

## 2018-05-04 MED ORDER — LORAZEPAM 2 MG/ML IJ SOLN
0.0000 mg | Freq: Four times a day (QID) | INTRAMUSCULAR | Status: AC
  Administered 2018-05-05: 1 mg via INTRAVENOUS
  Administered 2018-05-05: 2 mg via INTRAVENOUS
  Administered 2018-05-05: 1 mg via INTRAVENOUS
  Administered 2018-05-06: 2 mg via INTRAVENOUS
  Filled 2018-05-04 (×4): qty 1

## 2018-05-04 MED ORDER — BOOST HIGH PROTEIN PO LIQD
1.0000 | Freq: Three times a day (TID) | ORAL | Status: DC
  Filled 2018-05-04: qty 237

## 2018-05-04 MED ORDER — METOCLOPRAMIDE HCL 5 MG/ML IJ SOLN
10.0000 mg | Freq: Once | INTRAMUSCULAR | Status: AC
  Administered 2018-05-04: 10 mg via INTRAVENOUS
  Filled 2018-05-04: qty 2

## 2018-05-04 MED ORDER — SODIUM CHLORIDE 0.9 % IV SOLN
1.0000 g | INTRAVENOUS | Status: AC
  Administered 2018-05-04 – 2018-05-06 (×3): 1 g via INTRAVENOUS
  Filled 2018-05-04 (×3): qty 10

## 2018-05-04 MED ORDER — THIAMINE HCL 100 MG/ML IJ SOLN
100.0000 mg | Freq: Every day | INTRAMUSCULAR | Status: DC
  Administered 2018-05-04: 100 mg via INTRAVENOUS
  Filled 2018-05-04: qty 2

## 2018-05-04 MED ORDER — FERROUS SULFATE 325 (65 FE) MG PO TABS
325.0000 mg | ORAL_TABLET | Freq: Every day | ORAL | Status: DC
  Administered 2018-05-05 – 2018-05-07 (×3): 325 mg via ORAL
  Filled 2018-05-04 (×4): qty 1

## 2018-05-04 MED ORDER — CHLORDIAZEPOXIDE HCL 5 MG PO CAPS
15.0000 mg | ORAL_CAPSULE | Freq: Three times a day (TID) | ORAL | Status: DC
  Administered 2018-05-04 – 2018-05-06 (×5): 15 mg via ORAL
  Filled 2018-05-04 (×5): qty 3

## 2018-05-04 MED ORDER — ADULT MULTIVITAMIN W/MINERALS CH
1.0000 | ORAL_TABLET | Freq: Every day | ORAL | Status: DC
  Administered 2018-05-04 – 2018-05-07 (×4): 1 via ORAL
  Filled 2018-05-04 (×4): qty 1

## 2018-05-04 MED ORDER — ONDANSETRON HCL 4 MG/2ML IJ SOLN
4.0000 mg | Freq: Four times a day (QID) | INTRAMUSCULAR | Status: DC | PRN
  Administered 2018-05-04 – 2018-05-06 (×2): 4 mg via INTRAVENOUS
  Filled 2018-05-04 (×2): qty 2

## 2018-05-04 MED ORDER — LORAZEPAM 2 MG/ML IJ SOLN
1.0000 mg | Freq: Once | INTRAMUSCULAR | Status: AC
  Administered 2018-05-04: 1 mg via INTRAVENOUS
  Filled 2018-05-04: qty 1

## 2018-05-04 MED ORDER — VITAMIN B-1 100 MG PO TABS
100.0000 mg | ORAL_TABLET | Freq: Every day | ORAL | Status: DC
  Administered 2018-05-05 – 2018-05-07 (×3): 100 mg via ORAL
  Filled 2018-05-04 (×3): qty 1

## 2018-05-04 MED ORDER — SODIUM CHLORIDE 0.9 % IV BOLUS
2000.0000 mL | Freq: Once | INTRAVENOUS | Status: AC
  Administered 2018-05-04: 2000 mL via INTRAVENOUS

## 2018-05-04 MED ORDER — VENLAFAXINE HCL ER 37.5 MG PO CP24: 37.5000 mg | ORAL_CAPSULE | Freq: Every day | ORAL | Status: DC

## 2018-05-04 MED ORDER — ACYCLOVIR 400 MG PO TABS
400.0000 mg | ORAL_TABLET | Freq: Every day | ORAL | Status: DC
  Administered 2018-05-04 – 2018-05-06 (×9): 400 mg via ORAL
  Filled 2018-05-04 (×11): qty 1

## 2018-05-04 MED ORDER — ACETAMINOPHEN 650 MG RE SUPP: 650.0000 mg | Freq: Four times a day (QID) | RECTAL | Status: DC | PRN

## 2018-05-04 MED ORDER — FOLIC ACID 1 MG PO TABS
1.0000 mg | ORAL_TABLET | Freq: Every day | ORAL | Status: DC
  Administered 2018-05-04 – 2018-05-07 (×4): 1 mg via ORAL
  Filled 2018-05-04 (×4): qty 1

## 2018-05-04 MED ORDER — LORAZEPAM 2 MG/ML IJ SOLN: 2.0000 mg | INTRAMUSCULAR | Status: DC | PRN

## 2018-05-04 MED ORDER — ACETAMINOPHEN 325 MG PO TABS
650.0000 mg | ORAL_TABLET | Freq: Four times a day (QID) | ORAL | Status: DC | PRN
  Administered 2018-05-05 (×2): 650 mg via ORAL
  Filled 2018-05-04 (×2): qty 2

## 2018-05-04 MED ORDER — LACTATED RINGERS IV SOLN
INTRAVENOUS | Status: AC
  Administered 2018-05-04 – 2018-05-05 (×2): via INTRAVENOUS

## 2018-05-04 MED ORDER — POLYETHYLENE GLYCOL 3350 17 G PO PACK
17.0000 g | PACK | Freq: Every day | ORAL | Status: DC
  Filled 2018-05-04 (×3): qty 1

## 2018-05-04 MED ORDER — LORAZEPAM 1 MG PO TABS
2.0000 mg | ORAL_TABLET | ORAL | Status: DC | PRN
  Administered 2018-05-05: 2 mg via ORAL
  Filled 2018-05-04: qty 2

## 2018-05-04 MED ORDER — BISACODYL 10 MG RE SUPP: 10.0000 mg | Freq: Every day | RECTAL | Status: DC | PRN

## 2018-05-04 MED ORDER — PANTOPRAZOLE SODIUM 40 MG IV SOLR
40.0000 mg | Freq: Two times a day (BID) | INTRAVENOUS | Status: DC
  Administered 2018-05-04 – 2018-05-06 (×5): 40 mg via INTRAVENOUS
  Filled 2018-05-04 (×6): qty 40

## 2018-05-04 MED ORDER — VITAMIN B-12 1000 MCG PO TABS
1000.0000 ug | ORAL_TABLET | Freq: Every day | ORAL | Status: DC
  Administered 2018-05-05 – 2018-05-07 (×3): 1000 ug via ORAL
  Filled 2018-05-04 (×4): qty 1

## 2018-05-04 NOTE — ED Notes (Signed)
ice chips given

## 2018-05-04 NOTE — H&P (Signed)
[facility] H&P   Patient Demographics:    Susan [name], is a 37 y.o. female  MRN: [id number]   DOB - Jun 28, 1980  Admit Date - 05/04/2018  Outpatient Primary MD for the patient is Artelia Laroche, CNM      Patient coming from: Home  Chief Complaint  Patient presents with  . Nausea  . Emesis      HPI:    Susan [name]  is a 37 y.o. female, with history of alcohol abuse, recent history of left-sided Bell's palsy, alcoholic pancreatitis chronic, iron deficiency anemia, gastritis who has been in and out of the hospital recently due to alcohol-related problems and left-sided Bell's palsy comes today with 2-day history of nausea vomiting after an alcoholic binge few days ago, she has some epigastric pain which is constant nonradiating with no aggravating or relieving factors but associated with nausea and vomiting.  He came to the ER as she is not able to keep any food or medications down, the ER she had continued nausea vomiting, lab work indicated alcoholic hepatitis, UTI and I was called to admit the patient.    Review of systems:     A full 10 point Review of Systems was done, except as stated above, all other Review of Systems were negative.   With Past History of the following :    Past Medical History:  Diagnosis Date  . Alcohol abuse   . Depression   . Gastritis   . GIB (gastrointestinal bleeding)   . Iron deficiency anemia   . Pancreatitis       Past Surgical History:  Procedure Laterality Date  . ESOPHAGOGASTRODUODENOSCOPY (EGD) WITH PROPOFOL N/A 06/05/2016   Procedure: ESOPHAGOGASTRODUODENOSCOPY (EGD) WITH PROPOFOL;  Surgeon: Otis Brace, MD;  Location: [facility];  Service:  Gastroenterology;  Laterality: N/A;      Social History:     Social History   Tobacco Use  . Smoking status: Former Research scientist (life sciences)  . Smokeless tobacco: Never Used  Substance Use Topics  . Alcohol use: Yes    Comment: occasional         Family History :     Family History  Problem Relation Age of Onset  . Stroke Mother        Home Medications:   Prior to Admission medications   Medication Sig Start Date  End Date Taking? Authorizing Provider  acyclovir (ZOVIRAX) 400 MG tablet Take 1 tablet (400 mg total) by mouth 5 (five) times daily for 7 days. 05/02/18 05/09/18  Carmin Muskrat, MD  Cyanocobalamin (B-12) 1000 MCG TABS Take 1,000 mg by mouth daily.    [provider]  famotidine (PEPCID) 10 MG tablet Take 10 mg by mouth 2 (two) times daily.    [provider]  feeding supplement (BOOST HIGH PROTEIN) LIQD Take 237 mLs by mouth 3 (three) times daily between meals. 03/25/18   Dhungel, Nishant, MD  ferrous sulfate 325 (65 FE) MG tablet Take 325 mg by mouth daily with breakfast.    [provider]  Multiple Vitamins-Minerals (MULTIVITAMIN ADULT) CHEW Chew 1 tablet by mouth daily.    [provider]  polyethylene glycol (MIRALAX / GLYCOLAX) packet Take 17 g by mouth daily. 03/25/18   Dhungel, Flonnie Overman, MD  predniSONE (DELTASONE) 20 MG tablet Take 2 tablets (40 mg total) by mouth daily with breakfast. For the next four days 05/02/18   Carmin Muskrat, MD  venlafaxine XR (EFFEXOR-XR) 37.5 MG 24 hr capsule Take 37.5-75 mg by mouth at bedtime. 03/16/18   [provider]     Allergies:     Allergies  Allergen Reactions  . Latex Rash    Tape irritates the skin     Physical Exam:   Vitals  Blood pressure (!) 143/95, pulse (!) 114, temperature 99.4 F (37.4 C), temperature source Oral, resp. rate 20, SpO2 99 %.   1. General Caucasian female sitting in hospital bed in no apparent distress but slightly jittery with mild tremors,  2.  Normal affect and insight, Not Suicidal or Homicidal, Awake Alert, Oriented X 3.  3. No F.N deficits, ALL C.Nerves Intact except mild left-sided facial droop, Strength 5/5 all 4 extremities, Sensation intact all 4 extremities, Plantars down going.  4. Ears and Eyes appear Normal, Conjunctivae clear, PERRLA. Moist Oral Mucosa.  5. Supple Neck, No JVD, No cervical lymphadenopathy appriciated, No Carotid Bruits.  6. Symmetrical Chest wall movement, Good air movement bilaterally, CTAB.  7. RRR, No Gallops, Rubs or Murmurs, No Parasternal Heave.  8. Positive Bowel Sounds, Abdomen Soft, No tenderness, No organomegaly appriciated,No rebound -guarding or rigidity.  9.  No Cyanosis, Normal Skin Turgor, No Skin Rash or Bruise.  10. Good muscle tone,  joints appear normal , no effusions, Normal ROM.  11. No Palpable Lymph Nodes in Neck or Axillae      Data Review:    CBC Recent Labs  Lab 05/02/18 1718 05/04/18 1608  WBC 5.0 5.9  HGB 11.6* 10.4*  HCT 37.5 34.7*  PLT 265 277  MCV 89.3 90.6  MCH 27.6 27.2  MCHC 30.9 30.0  RDW 19.0* 18.7*  LYMPHSABS 2.8 0.9  MONOABS 0.6 0.4  EOSABS 0.0 0.1  BASOSABS 0.1 0.1   ------------------------------------------------------------------------------------------------------------------  Chemistries  Recent Labs  Lab 05/02/18 1718 05/04/18 1608  NA 143 143  K 3.0* 4.3  CL 106 107  CO2 25 19*  GLUCOSE 115* 184*  BUN <5* 7  CREATININE 0.68 0.84  CALCIUM 8.5* 8.7*  AST 379* 485*  ALT 100* 126*  ALKPHOS 101 101  BILITOT 1.0 1.6*   ------------------------------------------------------------------------------------------------------------------ estimated creatinine clearance is 72.5 mL/min (by C-G formula based on SCr of 0.84 mg/dL). ------------------------------------------------------------------------------------------------------------------ No results for input(s): TSH, T4TOTAL, T3FREE, THYROIDAB in the last 72 hours.  Invalid  input(s): FREET3  Coagulation profile Recent Labs  Lab 05/02/18 1718  INR 1.18   -------------------------------------------------------------------------------------------------------------------  No results for input(s): DDIMER in the last 72 hours. -------------------------------------------------------------------------------------------------------------------  Cardiac Enzymes No results for input(s): CKMB, TROPONINI, MYOGLOBIN in the last 168 hours.  Invalid input(s): CK ------------------------------------------------------------------------------------------------------------------ No results found for: BNP   ---------------------------------------------------------------------------------------------------------------  Urinalysis    Component Value Date/Time   COLORURINE AMBER (A) 05/04/2018 1608   APPEARANCEUR CLOUDY (A) 05/04/2018 1608   LABSPEC 1.025 05/04/2018 1608   PHURINE 6.0 05/04/2018 1608   GLUCOSEU 50 (A) 05/04/2018 1608   HGBUR NEGATIVE 05/04/2018 1608   BILIRUBINUR NEGATIVE 05/04/2018 1608   KETONESUR 80 (A) 05/04/2018 1608   PROTEINUR 30 (A) 05/04/2018 1608   NITRITE NEGATIVE 05/04/2018 1608   LEUKOCYTESUR NEGATIVE 05/04/2018 1608    ----------------------------------------------------------------------------------------------------------------   Imaging Results:    Ct Head Wo Contrast  Result Date: 05/02/2018 CLINICAL DATA:  37 year old female with history of numbness in the right-side of the face and arm for the past 24 hours ago. EXAM: CT HEAD WITHOUT CONTRAST TECHNIQUE: Contiguous axial images were obtained from the base of the skull through the vertex without intravenous contrast. COMPARISON:  None. FINDINGS: Brain: No evidence of acute infarction, hemorrhage, hydrocephalus, extra-axial collection or mass lesion/mass effect. Vascular: No hyperdense vessel or unexpected calcification. Skull: Normal. Negative for fracture or focal lesion.  Sinuses/Orbits: No acute finding. Other: None. IMPRESSION: 1. No acute intracranial abnormalities. The appearance of the brain is normal. Electronically Signed   By: Vinnie Langton M.D.   On: 05/02/2018 18:31        Assessment & Plan:     1.  Nausea vomiting due to alcoholic hepatitis and aggravation of her gastritis.  Will be admitted to telemetry, clear liquid diet, IV fluids, IV PPI, check plain abdominal x-ray, counseled to abstain from alcohol and monitor.  2.  Alcohol abuse with early DTs caused by nausea vomiting and inability to keep alcohol down.  Counseled to quit, CIWA protocol, prednisolone, scheduled Librium.  3.  UTI.  Check urine culture placed on Rocephin.  4.  Chronic alcoholic pancreatitis.  Currently stable.  5.  History of iron deficiency anemia.  Stable resume home iron supplementation once nausea vomiting better.  6.  Recent left-sided facial droop due to Bell's palsy.  Already much improved.  Home prednisone held as she will be on prednisolone.  7.  Check  beta-hCG.   DVT Prophylaxis  SCDs   AM Labs Ordered, also please review Full Orders  Family Communication: Admission, patients condition and plan of care including tests being ordered have been discussed with the patient and friend who indicate understanding and agree with the plan and Code Status.  Code Status Full  Likely DC to  Home 2-3 days  Condition GUARDED    Consults called: None    Admission status: Inpt    Time spent in minutes : 35   Lala Lund M.D on 05/04/2018 at 5:59 PM  To page go to [email or web address] - password [email or web address]

## 2018-05-04 NOTE — ED Provider Notes (Signed)
[facility] EMERGENCY DEPARTMENT Provider Note   CSN: [id number] Arrival date & time: 05/04/18  1505     History   Chief Complaint Chief Complaint  Patient presents with  . Nausea  . Emesis    HPI Susan [name] is a 37 y.o. female.  HPI patient has vomited multiple times onset 2 days ago after discharge from here for visit for facial droop.  Diagnosed with Bell's palsy.  She denies abdominal pain denies fever.  No other associated symptoms.  She was discharged from here for inpatient stay 03/25/2018 for alcohol induced pancreatitis, metabolic acidosis and acute transaminitis/alcoholic hepatitis.  She last drank alcohol 2 days ago.  Reports "2 drinks."  Nothing makes symptoms better or worse.  No other associated symptoms  Past Medical History:  Diagnosis Date  . Alcohol abuse   . Depression   . Gastritis   . GIB (gastrointestinal bleeding)   . Iron deficiency anemia   . Pancreatitis     Patient Active Problem List   Diagnosis Date Noted  . Alcohol-induced pancreatitis 03/25/2018  . Foreign body alimentary tract, subsequent encounter 03/25/2018  . AKI (acute kidney injury) (HCC) 03/25/2018  . Alcoholic hepatitis without ascites 03/25/2018  . Acute pancreatitis 03/22/2018  . Alcohol withdrawal (HCC) 03/31/2017  . Hypomagnesemia 03/31/2017  . Abnormal LFTs 03/31/2017  . Metabolic acidosis 03/31/2017  . Abdominal pain 03/31/2017  . Gastritis   . Depression   . Iron deficiency anemia   . GI bleed 09/05/2016  . Nausea & vomiting 06/04/2016  . Sepsis (HCC) 06/04/2016  . Hyperglycemia 06/04/2016  . DTs (delirium tremens) (HCC) 06/04/2016    Past Surgical History:  Procedure Laterality Date  . ESOPHAGOGASTRODUODENOSCOPY (EGD) WITH PROPOFOL N/A 06/05/2016   Procedure: ESOPHAGOGASTRODUODENOSCOPY (EGD) WITH PROPOFOL;  Surgeon: Kathi Der, MD;  Location: [facility];  Service: Gastroenterology;  Laterality: N/A;     OB History   None       Home Medications    Prior to Admission medications   Medication Sig Start Date End Date Taking? Authorizing Provider  acyclovir (ZOVIRAX) 400 MG tablet Take 1 tablet (400 mg total) by mouth 5 (five) times daily for 7 days. 05/02/18 05/09/18  Gerhard Munch, MD  Cyanocobalamin (B-12) 1000 MCG TABS Take 1,000 mg by mouth daily.    [provider]  famotidine (PEPCID) 10 MG tablet Take 10 mg by mouth 2 (two) times daily.    [provider]  feeding supplement (BOOST HIGH PROTEIN) LIQD Take 237 mLs by mouth 3 (three) times daily between meals. 03/25/18   Dhungel, Nishant, MD  ferrous sulfate 325 (65 FE) MG tablet Take 325 mg by mouth daily with breakfast.    [provider]  Multiple Vitamins-Minerals (MULTIVITAMIN ADULT) CHEW Chew 1 tablet by mouth daily.    [provider]  polyethylene glycol (MIRALAX / GLYCOLAX) packet Take 17 g by mouth daily. 03/25/18   Dhungel, Theda Belfast, MD  predniSONE (DELTASONE) 20 MG tablet Take 2 tablets (40 mg total) by mouth daily with breakfast. For the next four days 05/02/18   Gerhard Munch, MD  venlafaxine XR (EFFEXOR-XR) 37.5 MG 24 hr capsule Take 37.5-75 mg by mouth at bedtime. 03/16/18   [provider]    Family History Family History  Problem Relation Age of Onset  . Stroke Mother     Social History Social History   Tobacco Use  . Smoking status: Former Games developer  . Smokeless tobacco: Never Used  Substance Use Topics  .  Alcohol use: Yes    Comment: occasional  . Drug use: No     Allergies   Latex   Review of Systems Review of Systems  Constitutional: Negative.   HENT: Negative.   Respiratory: Negative.   Cardiovascular: Negative.   Gastrointestinal: Positive for vomiting.  Musculoskeletal: Negative.   Skin: Negative.   Neurological: Negative.   Psychiatric/Behavioral: Negative.   All other systems reviewed and are negative.    Physical Exam Updated Vital Signs BP (!) 143/95  (BP Location: Right Arm)   Pulse (!) 114   Temp 99.4 F (37.4 C) (Oral)   Resp 20   SpO2 99%   Physical Exam  Constitutional: She is oriented to person, place, and time.  Moderately ill-appearing, actively vomiting  HENT:  Head: Normocephalic and atraumatic.  Eyes: Pupils are equal, round, and reactive to light. Conjunctivae are normal.  Neck: Neck supple. No tracheal deviation present. No thyromegaly present.  Cardiovascular: Regular rhythm.  No murmur heard. Mildly tachycardic  Pulmonary/Chest: Effort normal and breath sounds normal.  Abdominal: Soft. Bowel sounds are normal. She exhibits no distension. There is no tenderness.  Musculoskeletal: Normal range of motion. She exhibits no edema or tenderness.  Neurological: She is alert and oriented to person, place, and time. No cranial nerve deficit. She exhibits normal muscle tone. Coordination normal.  Tremulous  Skin: Skin is warm and dry. No rash noted.  Psychiatric: She has a normal mood and affect.  Nursing note and vitals reviewed.    ED Treatments / Results  Labs (all labs ordered are listed, but only abnormal results are displayed) Labs Reviewed  CBC WITH DIFFERENTIAL/PLATELET - Abnormal; Notable for the following components:      Result Value   RBC 3.83 (*)    Hemoglobin 10.4 (*)    HCT 34.7 (*)    RDW 18.7 (*)    All other components within normal limits  COMPREHENSIVE METABOLIC PANEL  LIPASE, BLOOD  URINALYSIS, ROUTINE W REFLEX MICROSCOPIC  ETHANOL  I-STAT BETA HCG BLOOD, ED ([facility], [facility], [facility] ONLY)    EKG None  Radiology Ct Head Wo Contrast  Result Date: 05/02/2018 CLINICAL DATA:  37 year old female with history of numbness in the right-side of the face and arm for the past 24 hours ago. EXAM: CT HEAD WITHOUT CONTRAST TECHNIQUE: Contiguous axial images were obtained from the base of the skull through the vertex without intravenous contrast. COMPARISON:  None. FINDINGS: Brain: No evidence of acute infarction,  hemorrhage, hydrocephalus, extra-axial collection or mass lesion/mass effect. Vascular: No hyperdense vessel or unexpected calcification. Skull: Normal. Negative for fracture or focal lesion. Sinuses/Orbits: No acute finding. Other: None. IMPRESSION: 1. No acute intracranial abnormalities. The appearance of the brain is normal. Electronically Signed   By: Trudie Reed M.D.   On: 05/02/2018 18:31    Procedures Procedures (including critical care time)  Medications Ordered in ED Medications  sodium chloride 0.9 % bolus 2,000 mL (2,000 mLs Intravenous New Bag/Given 05/04/18 1627)  metoCLOPramide (REGLAN) injection 10 mg (10 mg Intravenous Given 05/04/18 1626)  LORazepam (ATIVAN) injection 1 mg (1 mg Intravenous Given 05/04/18 1626)   5 PM nausea improved after treatment with intravenous Ativan, intravenous Reglan and intravenous normal saline bolus.  Patient appears more comfortable.,  Less tremulous. Results for orders placed or performed during the hospital encounter of 05/04/18  Comprehensive metabolic panel  Result Value Ref Range   Sodium 143 135 - 145 mmol/L   Potassium 4.3 3.5 - 5.1 mmol/L  Chloride 107 98 - 111 mmol/L   CO2 19 (L) 22 - 32 mmol/L   Glucose, Bld 184 (H) 70 - 99 mg/dL   BUN 7 6 - 20 mg/dL   Creatinine, Ser 1.61 0.44 - 1.00 mg/dL   Calcium 8.7 (L) 8.9 - 10.3 mg/dL   Total Protein 7.0 6.5 - 8.1 g/dL   Albumin 3.6 3.5 - 5.0 g/dL   AST 096 (H) 15 - 41 U/L   ALT 126 (H) 0 - 44 U/L   Alkaline Phosphatase 101 38 - 126 U/L   Total Bilirubin 1.6 (H) 0.3 - 1.2 mg/dL   GFR calc non Af Amer >60 >60 mL/min   GFR calc Af Amer >60 >60 mL/min   Anion gap 17 (H) 5 - 15  Lipase, blood  Result Value Ref Range   Lipase 43 11 - 51 U/L  CBC with Differential/Platelet  Result Value Ref Range   WBC 5.9 4.0 - 10.5 K/uL   RBC 3.83 (L) 3.87 - 5.11 MIL/uL   Hemoglobin 10.4 (L) 12.0 - 15.0 g/dL   HCT 04.5 (L) 40.9 - 81.1 %   MCV 90.6 80.0 - 100.0 fL   MCH 27.2 26.0 - 34.0 pg    MCHC 30.0 30.0 - 36.0 g/dL   RDW 91.4 (H) 78.2 - 95.6 %   Platelets 277 150 - 400 K/uL   nRBC 0.0 0.0 - 0.2 %   Neutrophils Relative % 76 %   Neutro Abs 4.5 1.7 - 7.7 K/uL   Lymphocytes Relative 15 %   Lymphs Abs 0.9 0.7 - 4.0 K/uL   Monocytes Relative 6 %   Monocytes Absolute 0.4 0.1 - 1.0 K/uL   Eosinophils Relative 1 %   Eosinophils Absolute 0.1 0.0 - 0.5 K/uL   Basophils Relative 1 %   Basophils Absolute 0.1 0.0 - 0.1 K/uL   Immature Granulocytes 1 %   Abs Immature Granulocytes 0.03 0.00 - 0.07 K/uL  Urinalysis, Routine w reflex microscopic  Result Value Ref Range   Color, Urine AMBER (A) YELLOW   APPearance CLOUDY (A) CLEAR   Specific Gravity, Urine 1.025 1.005 - 1.030   pH 6.0 5.0 - 8.0   Glucose, UA 50 (A) NEGATIVE mg/dL   Hgb urine dipstick NEGATIVE NEGATIVE   Bilirubin Urine NEGATIVE NEGATIVE   Ketones, ur 80 (A) NEGATIVE mg/dL   Protein, ur 30 (A) NEGATIVE mg/dL   Nitrite NEGATIVE NEGATIVE   Leukocytes, UA NEGATIVE NEGATIVE   RBC / HPF 0-5 0 - 5 RBC/hpf   WBC, UA 11-20 0 - 5 WBC/hpf   Bacteria, UA FEW (A) NONE SEEN   Squamous Epithelial / LPF 21-50 0 - 5   Mucus PRESENT    Hyaline Casts, UA PRESENT   Ethanol  Result Value Ref Range   Alcohol, Ethyl (B) <10 <10 mg/dL  I-Stat Beta hCG blood, ED ([facility], [facility], [facility] only)  Result Value Ref Range   I-stat hCG, quantitative <5.0 <5 mIU/mL   Comment 3           Ct Head Wo Contrast  Result Date: 05/02/2018 CLINICAL DATA:  37 year old female with history of numbness in the right-side of the face and arm for the past 24 hours ago. EXAM: CT HEAD WITHOUT CONTRAST TECHNIQUE: Contiguous axial images were obtained from the base of the skull through the vertex without intravenous contrast. COMPARISON:  None. FINDINGS: Brain: No evidence of acute infarction, hemorrhage, hydrocephalus, extra-axial collection or mass lesion/mass effect. Vascular: No hyperdense vessel  or unexpected calcification. Skull: Normal. Negative for fracture or  focal lesion. Sinuses/Orbits: No acute finding. Other: None. IMPRESSION: 1. No acute intracranial abnormalities. The appearance of the brain is normal. Electronically Signed   By: Trudie Reed M.D.   On: 05/02/2018 18:31   Initial Impression / Assessment and Plan / ED Course  I have reviewed the triage vital signs and the nursing notes.  Pertinent labs & imaging results that were available during my care of the patient were reviewed by me and considered in my medical decision making (see chart for details).     Lab work consistent with elevated transaminases, worsening over 2 days ago, hyperbilirubinemia,, consistent with alcoholic hepatitis elevated anion gap,, anemia worsening over 2 days ago. Suspect the patient may be going through alcohol withdrawal given her history of alcohol abuse.  I have consulted Dr.Singh arrange for overnight stay Final Clinical Impressions(s) / ED Diagnoses  Diagnoses #1 vomiting #2 alcoholic hepatitis #3 tremors #4 anemia Final diagnoses:  None    ED Discharge Orders    None       Doug Sou, MD 05/04/18 1826

## 2018-05-04 NOTE — ED Triage Notes (Addendum)
Pt to ED for evaluation of N/V since discharge on Friday. Not able to keep meds down. Denies abdominal pain. Hx anxiety. 8 mg zofran and 500 ml NS given PTA with no relief.

## 2018-05-05 ENCOUNTER — Encounter (HOSPITAL_COMMUNITY): Payer: Self-pay | Admitting: General Practice

## 2018-05-05 ENCOUNTER — Inpatient Hospital Stay (HOSPITAL_COMMUNITY): Payer: Self-pay

## 2018-05-05 DIAGNOSIS — F1010 Alcohol abuse, uncomplicated: Secondary | ICD-10-CM

## 2018-05-05 DIAGNOSIS — R945 Abnormal results of liver function studies: Secondary | ICD-10-CM

## 2018-05-05 LAB — COMPREHENSIVE METABOLIC PANEL
ALT: 115 U/L — ABNORMAL HIGH (ref 0–44)
AST: 480 U/L — ABNORMAL HIGH (ref 15–41)
Albumin: 3 g/dL — ABNORMAL LOW (ref 3.5–5.0)
Alkaline Phosphatase: 91 U/L (ref 38–126)
Anion gap: 8 (ref 5–15)
BILIRUBIN TOTAL: 1.9 mg/dL — AB (ref 0.3–1.2)
BUN: 5 mg/dL — ABNORMAL LOW (ref 6–20)
CO2: 25 mmol/L (ref 22–32)
Calcium: 8.1 mg/dL — ABNORMAL LOW (ref 8.9–10.3)
Chloride: 104 mmol/L (ref 98–111)
Creatinine, Ser: 0.67 mg/dL (ref 0.44–1.00)
Glucose, Bld: 134 mg/dL — ABNORMAL HIGH (ref 70–99)
Potassium: 3.6 mmol/L (ref 3.5–5.1)
Sodium: 137 mmol/L (ref 135–145)
TOTAL PROTEIN: 6 g/dL — AB (ref 6.5–8.1)

## 2018-05-05 LAB — PROTIME-INR
INR: 1.32
Prothrombin Time: 16.2 seconds — ABNORMAL HIGH (ref 11.4–15.2)

## 2018-05-05 LAB — URINE CULTURE: CULTURE: NO GROWTH

## 2018-05-05 LAB — CBC
HEMATOCRIT: 29.6 % — AB (ref 36.0–46.0)
Hemoglobin: 8.9 g/dL — ABNORMAL LOW (ref 12.0–15.0)
MCH: 27.6 pg (ref 26.0–34.0)
MCHC: 30.1 g/dL (ref 30.0–36.0)
MCV: 91.6 fL (ref 80.0–100.0)
PLATELETS: 177 10*3/uL (ref 150–400)
RBC: 3.23 MIL/uL — ABNORMAL LOW (ref 3.87–5.11)
RDW: 18.6 % — AB (ref 11.5–15.5)
WBC: 4.6 10*3/uL (ref 4.0–10.5)
nRBC: 0 % (ref 0.0–0.2)

## 2018-05-05 LAB — HIV ANTIBODY (ROUTINE TESTING W REFLEX): HIV SCREEN 4TH GENERATION: NONREACTIVE

## 2018-05-05 MED ORDER — TRAMADOL HCL 50 MG PO TABS
50.0000 mg | ORAL_TABLET | Freq: Four times a day (QID) | ORAL | Status: DC | PRN
  Administered 2018-05-05 – 2018-05-06 (×2): 50 mg via ORAL
  Filled 2018-05-05 (×2): qty 1

## 2018-05-05 MED ORDER — BOOST / RESOURCE BREEZE PO LIQD CUSTOM
1.0000 | Freq: Three times a day (TID) | ORAL | Status: DC
  Administered 2018-05-05: 1 via ORAL

## 2018-05-05 MED ORDER — ENSURE ENLIVE PO LIQD
237.0000 mL | Freq: Three times a day (TID) | ORAL | Status: DC
  Administered 2018-05-05 (×2): 237 mL via ORAL

## 2018-05-05 MED ORDER — INFLUENZA VAC SPLIT QUAD 0.5 ML IM SUSY: 0.5000 mL | PREFILLED_SYRINGE | INTRAMUSCULAR | Status: DC

## 2018-05-05 MED ORDER — PREDNISONE 20 MG PO TABS
40.0000 mg | ORAL_TABLET | Freq: Every day | ORAL | Status: DC
  Administered 2018-05-05 – 2018-05-06 (×2): 40 mg via ORAL
  Filled 2018-05-05 (×2): qty 2

## 2018-05-05 NOTE — Plan of Care (Signed)
  Problem: Activity: Goal: Risk for activity intolerance will decrease Outcome: Progressing   Problem: Nutrition: Goal: Adequate nutrition will be maintained Outcome: Progressing   Problem: Coping: Goal: Level of anxiety will decrease Outcome: Progressing   Problem: Pain Managment: Goal: General experience of comfort will improve Outcome: Progressing   Problem: Safety: Goal: Ability to remain free from injury will improve Outcome: Progressing   

## 2018-05-05 NOTE — Progress Notes (Signed)
Pt OOB and ambulated with RN on unit hallway; diet upgraded to soft diet and pt tolerated meals well with no complaints. Pt continue to have green loose stools. Pt resting comfortably in bed with call light within reach. Will report off to oncoming RN. Dionne Bucy RN

## 2018-05-05 NOTE — Progress Notes (Signed)
Patient ID: Susan [name], female   DOB: Jan 30, 1981, 37 y.o.   MRN: [id number]  PROGRESS NOTE    Susan [name]  GBE:[id number] DOB: August 28, 1980 DOA: 05/04/2018 PCP: Artelia Laroche, CNM   Brief Narrative:  37 year old female with history of alcohol abuse, recent diagnosis of left-sided Bell's palsy for which she was started on acyclovir and prednisone, alcohol induced pancreatitis with recent admission and discharge in October 2019 for the same, iron deficiency anemia presented with abdominal pain with nausea and vomiting.  She was found to have alcoholic hepatitis and UTI and started on Rocephin, prednisolone and IV fluids.  Assessment & Plan:   Principal Problem:   Nausea & vomiting Active Problems:   DTs (delirium tremens) (HCC)   Abnormal LFTs   Acute pancreatitis   Alcohol-induced pancreatitis   Alcoholic hepatitis  Nausea, vomiting, abdominal pain due to alcoholic hepatitis and gastritis -Patient has been tolerating clear liquid diet.  Will advance to full liquid diet.  Continue IV PPI along with IV fluids. -Continue to advance diet as tolerated.  UTI -Follow urine cultures.  Continue Rocephin  Alcohol abuse with mild alcohol withdrawal on admission -Currently stable.  No tremors.  Hemodynamically stable.  Continue CIWA protocol with Librium along with thiamine, folic acid and multivitamins. -Counseled about abstinence -Social worker consult  Recent left-sided facial droop due to Bell's palsy -Much improvement.  Complete course of acyclovir and prednisone as recently prescribed as outpatient.  Switch prednisone to prednisone  History of alcohol induced pancreatitis -Lipase was 43 on admission  DVT prophylaxis: SCDs Code Status: Full Family Communication: Spoke to patient and friend at bedside Disposition Plan: Home in 24 to 48 hours if clinically improves Consultants: None  Procedures: None  Antimicrobials: Rocephin from 05/04/2018  onwards Acyclovir   Subjective: Patient seen and examined at bedside.  She feels better.  Currently not nauseous and tolerating diet.  No hallucinations or tremors.  Wants to advance diet  Objective: Vitals:   05/05/18 0500 05/05/18 0507 05/05/18 0558 05/05/18 1153  BP: 124/87 124/87 (!) 128/97 109/86  Pulse: 73 80 73 91  Resp: 18  18 18   Temp:   98.5 F (36.9 C) 98.3 F (36.8 C)  TempSrc:   Oral Oral  SpO2: 98%  99% 100%    Intake/Output Summary (Last 24 hours) at 05/05/2018 1159 Last data filed at 05/05/2018 1000 Gross per 24 hour  Intake 3848.37 ml  Output 1000 ml  Net 2848.37 ml   There were no vitals filed for this visit.  Examination:  General exam: Appears calm and comfortable.  Awake and alert Respiratory system: Bilateral decreased breath sounds at bases Cardiovascular system: S1 & S2 heard, Rate controlled Gastrointestinal system: Abdomen is nondistended, soft and mildly tender in the epigastric region. Normal bowel sounds heard. Extremities: No cyanosis, clubbing, edema    Data Reviewed: I have personally reviewed following labs and imaging studies  CBC: Recent Labs  Lab 05/02/18 1718 05/04/18 1608 05/05/18 0248  WBC 5.0 5.9 4.6  NEUTROABS 1.5* 4.5  --   HGB 11.6* 10.4* 8.9*  HCT 37.5 34.7* 29.6*  MCV 89.3 90.6 91.6  PLT 265 277 758   Basic Metabolic Panel: Recent Labs  Lab 05/02/18 1718 05/04/18 1608 05/05/18 0248  NA 143 143 137  K 3.0* 4.3 3.6  CL 106 107 104  CO2 25 19* 25  GLUCOSE 115* 184* 134*  BUN <5* 7 5*  CREATININE 0.68 0.84 0.67  CALCIUM 8.5* 8.7* 8.1*   GFR:  Estimated Creatinine Clearance: 76.2 mL/min (by C-G formula based on SCr of 0.67 mg/dL). Liver Function Tests: Recent Labs  Lab 05/02/18 1718 05/04/18 1608 05/05/18 0248  AST 379* 485* 480*  ALT 100* 126* 115*  ALKPHOS 101 101 91  BILITOT 1.0 1.6* 1.9*  PROT 7.0 7.0 6.0*  ALBUMIN 3.5 3.6 3.0*   Recent Labs  Lab 05/04/18 1608  LIPASE 43   No results for  input(s): AMMONIA in the last 168 hours. Coagulation Profile: Recent Labs  Lab 05/02/18 1718 05/04/18 1842 05/05/18 0248  INR 1.18 1.35 1.32   Cardiac Enzymes: No results for input(s): CKTOTAL, CKMB, CKMBINDEX, TROPONINI in the last 168 hours. BNP (last 3 results) No results for input(s): PROBNP in the last 8760 hours. HbA1C: No results for input(s): HGBA1C in the last 72 hours. CBG: No results for input(s): GLUCAP in the last 168 hours. Lipid Profile: No results for input(s): CHOL, HDL, LDLCALC, TRIG, CHOLHDL, LDLDIRECT in the last 72 hours. Thyroid Function Tests: No results for input(s): TSH, T4TOTAL, FREET4, T3FREE, THYROIDAB in the last 72 hours. Anemia Panel: No results for input(s): VITAMINB12, FOLATE, FERRITIN, TIBC, IRON, RETICCTPCT in the last 72 hours. Sepsis Labs: No results for input(s): PROCALCITON, LATICACIDVEN in the last 168 hours.  No results found for this or any previous visit (from the past 240 hour(s)).       Radiology Studies: Dg Abd Portable 1v  Result Date: 05/05/2018 CLINICAL DATA:  Nausea and vomiting EXAM: PORTABLE ABDOMEN - 1 VIEW COMPARISON:  05/04/2018 abdominal radiograph FINDINGS: Resolved gaseous distention of the stomach. No dilated small bowel loops. Mild gas and stool in the large bowel. No evidence of pneumatosis or pneumoperitoneum. No radiopaque nephrolithiasis. IMPRESSION: Nonobstructive bowel gas pattern. Electronically Signed   By: Ilona Sorrel M.D.   On: 05/05/2018 08:24   Dg Abd Portable 1v  Result Date: 05/04/2018 CLINICAL DATA:  Nausea and vomiting. EXAM: PORTABLE ABDOMEN - 1 VIEW COMPARISON:  Two-view abdomen in 03/25/2018. FINDINGS: There is mild gaseous distention of the stomach. Bowel gas pattern is otherwise normal. Axial skeleton is within normal limits. IMPRESSION: 1. Mild gaseous distention of the stomach is nonspecific. This may reflect some element of gastric outlet obstruction. A normal distal bowel gas pattern is  present. Electronically Signed   By: San Morelle M.D.   On: 05/04/2018 18:36        Scheduled Meds: . acyclovir  400 mg Oral 5 X Daily  . chlordiazePOXIDE  15 mg Oral TID  . feeding supplement  1 Container Oral TID BM  . feeding supplement (ENSURE ENLIVE)  237 mL Oral TID BM  . ferrous sulfate  325 mg Oral Q breakfast  . folic acid  1 mg Oral Daily  . [START ON 05/06/2018] Influenza vac split quadrivalent PF  0.5 mL Intramuscular Tomorrow-1000  . LORazepam  0-4 mg Intravenous Q6H   Followed by  . [START ON 05/06/2018] LORazepam  0-4 mg Intravenous Q12H  . multivitamin with minerals  1 tablet Oral Daily  . pantoprazole (PROTONIX) IV  40 mg Intravenous Q12H  . polyethylene glycol  17 g Oral Daily  . prednisoLONE  40 mg Oral Daily  . sodium chloride flush  3 mL Intravenous Q12H  . thiamine  100 mg Oral Daily   Or  . thiamine  100 mg Intravenous Daily  . vitamin B-12  1,000 mcg Oral Daily   Continuous Infusions: . cefTRIAXone (ROCEPHIN)  IV Stopped (05/04/18 1932)  . lactated ringers 75 mL/hr  at 05/05/18 1000     LOS: 1 day        Aline August, MD [facility] Pager [phone]  If 7PM-7AM, please contact night-coverage [email or web address] Password [email or web address] 05/05/2018, 11:59 AM

## 2018-05-05 NOTE — Progress Notes (Signed)
Nutrition Brief Note  Patient identified on the Malnutrition Screening Tool (MST) Report  Wt Readings from Last 15 Encounters:  05/02/18 54.4 kg  03/25/18 57 kg  03/31/17 64.3 kg  02/28/17 60.3 kg  01/27/17 60.8 kg  11/04/16 54.4 kg  06/05/16 52.92 kg   37 year old female with history of alcohol abuse, recent diagnosis of left-sided Bell's palsy for which she was started on acyclovir and prednisone, alcohol induced pancreatitis with recent admission and discharge in October 2019 for the same, iron deficiency anemia presented with abdominal pain with nausea and vomiting.  She was found to have alcoholic hepatitis and UTI and started on Rocephin, prednisolone and IV fluids.  Pt admitted with nausea, vomiting, abdominal pain due to alcoholic hepatitis and gastritis.   Pt sleeping soundly at time of visit. She has been tolerating clear and full liquids well. Noted meal completion 100%.   Reviewed wt hx; pt appears to have a history of weight fluctuations. Noted wt ranges between 120-130#.   Labs reviewed.   Estimated body mass index is 21.95 kg/m as calculated from the following:   Height as of 05/02/18: 5\' 2"  (1.575 m).   Weight as of 05/02/18: 54.4 kg. Patient meets criteria for normal weight range based on current BMI.   Current diet order is Full liquid, patient is consuming approximately 100% of meals at this time. Labs and medications reviewed.   No nutrition interventions warranted at this time. If nutrition issues arise, please consult RD.   Debbi Strandberg A. Mayford Knife, RD, LDN, CDE Pager: [phone] After hours Pager: [phone]

## 2018-05-06 LAB — CBC
HCT: 29.5 % — ABNORMAL LOW (ref 36.0–46.0)
Hemoglobin: 9.1 g/dL — ABNORMAL LOW (ref 12.0–15.0)
MCH: 28.3 pg (ref 26.0–34.0)
MCHC: 30.8 g/dL (ref 30.0–36.0)
MCV: 91.9 fL (ref 80.0–100.0)
Platelets: 154 10*3/uL (ref 150–400)
RBC: 3.21 MIL/uL — ABNORMAL LOW (ref 3.87–5.11)
RDW: 17.8 % — AB (ref 11.5–15.5)
WBC: 7 10*3/uL (ref 4.0–10.5)
nRBC: 0 % (ref 0.0–0.2)

## 2018-05-06 LAB — COMPREHENSIVE METABOLIC PANEL
ALBUMIN: 3.1 g/dL — AB (ref 3.5–5.0)
ALT: 108 U/L — ABNORMAL HIGH (ref 0–44)
AST: 304 U/L — ABNORMAL HIGH (ref 15–41)
Alkaline Phosphatase: 83 U/L (ref 38–126)
Anion gap: 9 (ref 5–15)
BILIRUBIN TOTAL: 1 mg/dL (ref 0.3–1.2)
CALCIUM: 8.8 mg/dL — AB (ref 8.9–10.3)
CO2: 26 mmol/L (ref 22–32)
CREATININE: 0.69 mg/dL (ref 0.44–1.00)
Chloride: 102 mmol/L (ref 98–111)
GFR calc Af Amer: >60 mL/min (ref >60)
GLUCOSE: 177 mg/dL — AB (ref 70–99)
Potassium: 3.6 mmol/L (ref 3.5–5.1)
Sodium: 137 mmol/L (ref 135–145)
TOTAL PROTEIN: 6.1 g/dL — AB (ref 6.5–8.1)

## 2018-05-06 LAB — MAGNESIUM: MAGNESIUM: 1.5 mg/dL — AB (ref 1.7–2.4)

## 2018-05-06 LAB — PROTIME-INR
INR: 1.29
Prothrombin Time: 15.9 seconds — ABNORMAL HIGH (ref 11.4–15.2)

## 2018-05-06 MED ORDER — CHLORDIAZEPOXIDE HCL 5 MG PO CAPS
10.0000 mg | ORAL_CAPSULE | Freq: Three times a day (TID) | ORAL | Status: DC
  Administered 2018-05-06: 10 mg via ORAL
  Filled 2018-05-06: qty 2

## 2018-05-06 MED ORDER — CHLORDIAZEPOXIDE HCL 5 MG PO CAPS
5.0000 mg | ORAL_CAPSULE | Freq: Three times a day (TID) | ORAL | Status: DC
  Administered 2018-05-06 – 2018-05-07 (×2): 5 mg via ORAL
  Filled 2018-05-06 (×2): qty 1

## 2018-05-06 MED ORDER — POTASSIUM CHLORIDE 2 MEQ/ML IV SOLN
INTRAVENOUS | Status: DC
  Administered 2018-05-06 – 2018-05-07 (×3): via INTRAVENOUS
  Filled 2018-05-06 (×5): qty 1000

## 2018-05-06 MED ORDER — METHYLPREDNISOLONE SODIUM SUCC 40 MG IJ SOLR
40.0000 mg | Freq: Every day | INTRAMUSCULAR | Status: DC
  Filled 2018-05-06: qty 1

## 2018-05-06 MED ORDER — MAGNESIUM SULFATE 2 GM/50ML IV SOLN
2.0000 g | Freq: Once | INTRAVENOUS | Status: AC
  Administered 2018-05-06: 2 g via INTRAVENOUS
  Filled 2018-05-06: qty 50

## 2018-05-06 MED ORDER — INFLUENZA VAC SPLIT QUAD 0.5 ML IM SUSY: 0.5000 mL | PREFILLED_SYRINGE | INTRAMUSCULAR | Status: DC

## 2018-05-06 NOTE — Progress Notes (Signed)
Patient ID: Susan [name], female   DOB: [birth year], 37 y.o.   MRN: [id number]  PROGRESS NOTE    Susan [name]  NWG:[id number] DOB: Nov 03, 1980 DOA: 05/04/2018 PCP: Marlinda Mike, CNM   Brief Narrative:  37 year old female with history of alcohol abuse, recent diagnosis of left-sided Bell's palsy for which she was started on acyclovir and prednisone, alcohol induced pancreatitis with recent admission and discharge in October 2019 for the same, iron deficiency anemia presented with abdominal pain with nausea and vomiting.  She was found to have alcoholic hepatitis and UTI and started on Rocephin, prednisolone and IV fluids.  Assessment & Plan:   Principal Problem:   Nausea & vomiting Active Problems:   DTs (delirium tremens) (HCC)   Abnormal LFTs   Acute pancreatitis   Alcohol-induced pancreatitis   Alcoholic hepatitis  Nausea, vomiting, abdominal pain due to alcoholic hepatitis and gastritis:  - Revert diet to fluids, failed advancement 11/26. Restart IV fluids, transition steroids to IV. Plan to taper steroids more quickly than typical 1 month due to discriminant function of < 32.  - Continue PPI - Continue IV antiemetics. - LFTs improving modestly today, abd XR benign. Trend.  Pyuria: On non-clean catch specimen with negative urine culture - Completed 3 days CTX.   Alcohol abuse and withdrawal:  - Taper librium today. Suspect withdrawal has peaked.  - Continue CIWA - Counseling for cessation provided.  Recent left-sided facial droop due to Bell's palsy: Significantly improved with treatment of hypokalemia.  - DC acyclovir after 5 days since not severe at presentation and resolved, continue steroids directed for hepatitis (would usually only continue 1 week for this).   History of alcohol induced pancreatitis: Lipase was 43 on admission  Hypokalemia: Replace prn and recheck.   Hypomagnesemia:  - Replace and recheck  History of thyroid disorder, NOS: Unclear  history. No palpable goiter.  - Check TSH.   DVT prophylaxis: SCDs Code Status: Full Family Communication: Friend/neighbor at bedside per pt request. Disposition Plan: Home once tolerating po reliably. Significant emesis limiting ability to discharge. Consultants: None  Procedures: None  Antimicrobials:  Rocephin 11/25 - 11/27 Acyclovir  Subjective: Reports emesis, sometimes after eating and sometimes dry heaving. Associated nausea is severe. Vomited twice this morning and once last night.   Objective: Vitals:   05/05/18 1932 05/06/18 0030 05/06/18 0442 05/06/18 1227  BP: (!) 126/93 (!) 137/96 (!) 132/99 (!) 129/95  Pulse: (!) 117 87 83 84  Resp: 18 18 18 18   Temp: 98.6 F (37 C) 98.2 F (36.8 C) 98.3 F (36.8 C) 98.6 F (37 C)  TempSrc: Oral Oral Oral Oral  SpO2: 99% 99% 98% 99%  Weight:   55.2 kg   Height:        Intake/Output Summary (Last 24 hours) at 05/06/2018 1553 Last data filed at 05/06/2018 0102 Gross per 24 hour  Intake 1308.75 ml  Output -  Net 1308.75 ml   Filed Weights   05/05/18 1738 05/06/18 0442  Weight: 54.4 kg 55.2 kg    Examination: Gen: 37 y.o. female in no distress Pulm: Nonlabored breathing room air. Clear. CV: Regular rate and rhythm. No murmur, rub, or gallop. No JVD, no dependent edema. GI: Abdomen soft, tender in epigastrium and RUQ without rebound or guarding, non-distended, with normoactive bowel sounds.  Ext: Warm, no deformities Skin: No jaundice, rashes, lesions or ulcers on visualized skin.  Neuro: Alert and oriented. No asterixis or focal neurological deficits. Psych: Judgement and insight appear fair.  Mood euthymic & affect congruent. Behavior is appropriate.    Data Reviewed: I have personally reviewed following labs and imaging studies  CBC: Recent Labs  Lab 05/02/18 1718 05/04/18 1608 05/05/18 0248 05/06/18 0551  WBC 5.0 5.9 4.6 7.0  NEUTROABS 1.5* 4.5  --   --   HGB 11.6* 10.4* 8.9* 9.1*  HCT 37.5 34.7* 29.6*  29.5*  MCV 89.3 90.6 91.6 91.9  PLT 265 277 177 154   Basic Metabolic Panel: Recent Labs  Lab 05/02/18 1718 05/04/18 1608 05/05/18 0248 05/06/18 0551  NA 143 143 137 137  K 3.0* 4.3 3.6 3.6  CL 106 107 104 102  CO2 25 19* 25 26  GLUCOSE 115* 184* 134* 177*  BUN <5* 7 5* <5*  CREATININE 0.68 0.84 0.67 0.69  CALCIUM 8.5* 8.7* 8.1* 8.8*  MG  --   --   --  1.5*   GFR: Estimated Creatinine Clearance: 76.2 mL/min (by C-G formula based on SCr of 0.69 mg/dL). Liver Function Tests: Recent Labs  Lab 05/02/18 1718 05/04/18 1608 05/05/18 0248 05/06/18 0551  AST 379* 485* 480* 304*  ALT 100* 126* 115* 108*  ALKPHOS 101 101 91 83  BILITOT 1.0 1.6* 1.9* 1.0  PROT 7.0 7.0 6.0* 6.1*  ALBUMIN 3.5 3.6 3.0* 3.1*   Recent Labs  Lab 05/04/18 1608  LIPASE 43   No results for input(s): AMMONIA in the last 168 hours. Coagulation Profile: Recent Labs  Lab 05/02/18 1718 05/04/18 1842 05/05/18 0248 05/06/18 0551  INR 1.18 1.35 1.32 1.29   Recent Results (from the past 240 hour(s))  Culture, Urine     Status: None   Collection Time: 05/04/18  4:11 PM  Result Value Ref Range Status   Specimen Description URINE, CLEAN CATCH  Final   Special Requests NONE  Final   Culture   Final    NO GROWTH Performed at [facility], [address], [place], [place] [address]    Report Status 05/05/2018 FINAL  Final    Radiology Studies: Dg Abd Portable 1v  Result Date: 05/05/2018 CLINICAL DATA:  Nausea and vomiting EXAM: PORTABLE ABDOMEN - 1 VIEW COMPARISON:  05/04/2018 abdominal radiograph FINDINGS: Resolved gaseous distention of the stomach. No dilated small bowel loops. Mild gas and stool in the large bowel. No evidence of pneumatosis or pneumoperitoneum. No radiopaque nephrolithiasis. IMPRESSION: Nonobstructive bowel gas pattern. Electronically Signed   By: Delbert Phenix M.D.   On: 05/05/2018 08:24   Dg Abd Portable 1v  Result Date: 05/04/2018 CLINICAL DATA:  Nausea and vomiting.  EXAM: PORTABLE ABDOMEN - 1 VIEW COMPARISON:  Two-view abdomen in 03/25/2018. FINDINGS: There is mild gaseous distention of the stomach. Bowel gas pattern is otherwise normal. Axial skeleton is within normal limits. IMPRESSION: 1. Mild gaseous distention of the stomach is nonspecific. This may reflect some element of gastric outlet obstruction. A normal distal bowel gas pattern is present. Electronically Signed   By: Marin Roberts M.D.   On: 05/04/2018 18:36   Scheduled Meds: . acyclovir  400 mg Oral 5 X Daily  . chlordiazePOXIDE  10 mg Oral TID  . ferrous sulfate  325 mg Oral Q breakfast  . folic acid  1 mg Oral Daily  . [START ON 05/07/2018] Influenza vac split quadrivalent PF  0.5 mL Intramuscular Tomorrow-1000  . LORazepam  0-4 mg Intravenous Q6H   Followed by  . LORazepam  0-4 mg Intravenous Q12H  . [START ON 05/07/2018] methylPREDNISolone (SOLU-MEDROL) injection  40  mg Intravenous Daily  . multivitamin with minerals  1 tablet Oral Daily  . pantoprazole (PROTONIX) IV  40 mg Intravenous Q12H  . polyethylene glycol  17 g Oral Daily  . sodium chloride flush  3 mL Intravenous Q12H  . thiamine  100 mg Oral Daily   Or  . thiamine  100 mg Intravenous Daily  . vitamin B-12  1,000 mcg Oral Daily   Continuous Infusions: . cefTRIAXone (ROCEPHIN)  IV 1 g (05/05/18 1922)  . lactated ringers with kcl 90 mL/hr at 05/06/18 1202     LOS: 2 days   Tyrone Nine, MD [facility] [email or web address] Password [facility] 05/06/2018, 3:53 PM

## 2018-05-06 NOTE — Care Management Note (Signed)
Case Management Note  Patient Details  Name: SHAKEISHA SHADDOCK MRN: [id number] Date of Birth: 12-18-1980  Subjective/Objective:   DT, alcohol induced pancreatitis                 Action/Plan: NCM spoke to pt and states she recently followed up with new PCP, Herma Carson MD. Provided pt with [facility] letter with one time use per year and $3 copay.   Expected Discharge Date:                 Expected Discharge Plan:  Home/Self Care  In-House Referral:  NA  Discharge planning Services  CM Consult, Medication Assistance, MATCH Program  Post Acute Care Choice:  NA Choice offered to:  NA  DME Arranged:  N/A DME Agency:  NA  HH Arranged:  NA HH Agency:  NA  Status of Service:  Completed, signed off  If discussed at Long Length of Stay Meetings, dates discussed:    Additional Comments:  Elliot Cousin, RN 05/06/2018, 4:45 PM

## 2018-05-06 NOTE — Progress Notes (Signed)
Patient stated she is doing well, no n/v asked for her diet to be advanced since she is hungry. Advanced full diet to soft diet per prior order.  Mak Bonny, RN

## 2018-05-07 LAB — COMPREHENSIVE METABOLIC PANEL
ALK PHOS: 80 U/L (ref 38–126)
ALT: 101 U/L — AB (ref 0–44)
AST: 216 U/L — ABNORMAL HIGH (ref 15–41)
Albumin: 3.1 g/dL — ABNORMAL LOW (ref 3.5–5.0)
Anion gap: 7 (ref 5–15)
CHLORIDE: 102 mmol/L (ref 98–111)
CO2: 28 mmol/L (ref 22–32)
CREATININE: 0.74 mg/dL (ref 0.44–1.00)
Calcium: 8.6 mg/dL — ABNORMAL LOW (ref 8.9–10.3)
GFR calc Af Amer: >60 mL/min (ref >60)
Glucose, Bld: 124 mg/dL — ABNORMAL HIGH (ref 70–99)
Potassium: 3.8 mmol/L (ref 3.5–5.1)
Sodium: 137 mmol/L (ref 135–145)
Total Bilirubin: 0.7 mg/dL (ref 0.3–1.2)
Total Protein: 6.2 g/dL — ABNORMAL LOW (ref 6.5–8.1)

## 2018-05-07 LAB — CBC
HCT: 30 % — ABNORMAL LOW (ref 36.0–46.0)
HEMOGLOBIN: 9.2 g/dL — AB (ref 12.0–15.0)
MCH: 28 pg (ref 26.0–34.0)
MCHC: 30.7 g/dL (ref 30.0–36.0)
MCV: 91.5 fL (ref 80.0–100.0)
NRBC: 0 % (ref 0.0–0.2)
PLATELETS: 155 10*3/uL (ref 150–400)
RBC: 3.28 MIL/uL — AB (ref 3.87–5.11)
RDW: 17.8 % — ABNORMAL HIGH (ref 11.5–15.5)
WBC: 6.9 10*3/uL (ref 4.0–10.5)

## 2018-05-07 LAB — MAGNESIUM: Magnesium: 1.7 mg/dL (ref 1.7–2.4)

## 2018-05-07 LAB — PROTIME-INR
INR: 1.12
PROTHROMBIN TIME: 14.3 s (ref 11.4–15.2)

## 2018-05-07 LAB — TSH: TSH: 2.434 u[IU]/mL (ref 0.350–4.500)

## 2018-05-07 MED ORDER — PREDNISONE 10 MG PO TABS: ORAL_TABLET | ORAL | 0 refills | Status: AC

## 2018-05-07 NOTE — Progress Notes (Signed)
Patient escorted off 3east by NT via wheelchair, d/c reviewed with no questions/complications.

## 2018-05-07 NOTE — Progress Notes (Signed)
Patient resting comfortably during shift report. Denies complaints.  

## 2018-05-07 NOTE — Discharge Summary (Signed)
Physician Discharge Summary  Susan [name] XYI:[id number] DOB: [birth year] DOA: 05/04/2018  PCP: Cathlean Sauer, MD  Admit date: 05/04/2018 Discharge date: 05/07/2018  Admitted From: Home Disposition: Home   Recommendations for Outpatient Follow-up:  1. Follow up with PCP in 1-2 weeks. 2. Continue alcohol cessation counseling and resource provision. 3. Please obtain CMP at follow up.  Home Health: None Equipment/Devices: None Discharge Condition: Stable CODE STATUS: Full Diet recommendation: As tolerated  Brief/Interim Summary: 37 year old female with history of alcohol abuse, recent diagnosis of left-sided Bell's palsy for which she was started on acyclovir and prednisone, alcohol induced pancreatitis with recent admission and discharge in October 2019 for the same, iron deficiency anemia presented with abdominal pain with nausea and vomiting.  She was found to have alcoholic hepatitis and UTI and started on Rocephin, prednisolone and IV fluids. Lipase was normal. Librium was also provided along with CIWA protocol for alcohol withdrawal. Diet was slowly advanced as symptoms improved, LFTs decreasing slowly, and on 11/28 she was tolerating a soft diet and oral medications. Librium has been tapered and there are no signs of acute alcohol withdrawal at discharge.  Discharge Diagnoses:  Principal Problem:   Nausea & vomiting Active Problems:   DTs (delirium tremens) (HCC)   Abnormal LFTs   Acute pancreatitis   Alcohol-induced pancreatitis   Alcoholic hepatitis  Nausea, vomiting, abdominal pain due to alcoholic hepatitis and gastritis: Symptoms improved. - Plan to taper steroids more quickly than typical 1 month due to discriminant function of < 32.  - Continue PPI - LFTs improving, abd XR and exam benign. Recheck at follow up recommended.  Pyuria: On non-clean catch specimen with negative urine culture - Completed 3 days CTX.   Alcohol abuse and withdrawal:  - Tapered  librium to off with no evidence of acute withdrawal on day of discharge.  - Counseling for cessation provided.  Recent left-sided facial droop due to Bell's palsy: Significantly improved with treatment of hypokalemia.  - DC acyclovir after 5 days since not severe at presentation and resolved, continue steroids directed for hepatitis (would usually only continue 1 week for this).   History of alcohol induced pancreatitis: Lipase was 43 on admission  Hypokalemia: Replace prn and recheck.   Hypomagnesemia:  - Replaced, can continue po replacement and consider recheck at follow up.   History of thyroid disorder, NOS: Unclear history. No palpable goiter. TSH wnl. - No further evaluation planned.  Discharge Instructions Discharge Instructions    Discharge instructions   Complete by:  As directed    Continue tking prednisone taper as prescribed to help with inflammation of the liver. You must stop drinking alcohol and follow up with your doctor in the next week for recheck of labs. Seek medical attention sooner if your symptoms return/worsen.   Increase activity slowly   Complete by:  As directed      Allergies as of 05/07/2018      Reactions   Effexor [venlafaxine] Palpitations, Other (See Comments)   Panic attacks   Latex Rash   Tape irritates the skin/ please use paper tape   Tape Rash   pls use paper tape      Medication List    STOP taking these medications   acetaminophen 500 MG tablet Commonly known as:  TYLENOL   acyclovir 400 MG tablet Commonly known as:  ZOVIRAX   famotidine 40 MG/5ML suspension Commonly known as:  PEPCID   feeding supplement Liqd   polyethylene glycol packet Commonly known as:  MIRALAX / GLYCOLAX     TAKE these medications   FERROUS GLUCONATE PO Take 1 tablet by mouth daily.   MAGNESIUM PO Take 1 tablet by mouth daily.   POTASSIUM PO Take 1 tablet by mouth daily.   predniSONE 10 MG tablet Commonly known as:  DELTASONE Take 4  tablets (40 mg total) by mouth daily for 3 days, THEN 2 tablets (20 mg total) daily for 3 days, THEN 1 tablet (10 mg total) daily for 3 days, THEN 0.5 tablets (5 mg total) daily for 4 days. Start taking on:  05/07/2018 What changed:    medication strength  See the new instructions.  Another medication with the same name was removed. Continue taking this medication, and follow the directions you see here.      Follow-up Information    Cathlean Sauer, MD. Schedule an appointment as soon as possible for a visit in 1 week.   Specialty:  Family Medicine Contact information: [address] [place] [address] [phone]          Allergies  Allergen Reactions  . Effexor [Venlafaxine] Palpitations and Other (See Comments)    Panic attacks  . Latex Rash    Tape irritates the skin/ please use paper tape  . Tape Rash    pls use paper tape    Consultations:  None  Procedures/Studies: Ct Head Wo Contrast  Result Date: 05/02/2018 CLINICAL DATA:  37 year old female with history of numbness in the right-side of the face and arm for the past 24 hours ago. EXAM: CT HEAD WITHOUT CONTRAST TECHNIQUE: Contiguous axial images were obtained from the base of the skull through the vertex without intravenous contrast. COMPARISON:  None. FINDINGS: Brain: No evidence of acute infarction, hemorrhage, hydrocephalus, extra-axial collection or mass lesion/mass effect. Vascular: No hyperdense vessel or unexpected calcification. Skull: Normal. Negative for fracture or focal lesion. Sinuses/Orbits: No acute finding. Other: None. IMPRESSION: 1. No acute intracranial abnormalities. The appearance of the brain is normal. Electronically Signed   By: Vinnie Langton M.D.   On: 05/02/2018 18:31   Dg Abd Portable 1v  Result Date: 05/05/2018 CLINICAL DATA:  Nausea and vomiting EXAM: PORTABLE ABDOMEN - 1 VIEW COMPARISON:  05/04/2018 abdominal radiograph FINDINGS: Resolved gaseous distention of  the stomach. No dilated small bowel loops. Mild gas and stool in the large bowel. No evidence of pneumatosis or pneumoperitoneum. No radiopaque nephrolithiasis. IMPRESSION: Nonobstructive bowel gas pattern. Electronically Signed   By: Ilona Sorrel M.D.   On: 05/05/2018 08:24   Dg Abd Portable 1v  Result Date: 05/04/2018 CLINICAL DATA:  Nausea and vomiting. EXAM: PORTABLE ABDOMEN - 1 VIEW COMPARISON:  Two-view abdomen in 03/25/2018. FINDINGS: There is mild gaseous distention of the stomach. Bowel gas pattern is otherwise normal. Axial skeleton is within normal limits. IMPRESSION: 1. Mild gaseous distention of the stomach is nonspecific. This may reflect some element of gastric outlet obstruction. A normal distal bowel gas pattern is present. Electronically Signed   By: San Morelle M.D.   On: 05/04/2018 18:36     Subjective: Tolerated a significant amount of breakfast, has not had vomiting in past 24 hours. No abdominal pain, minimal nausea.   Discharge Exam: Vitals:   05/06/18 2002 05/07/18 0458  BP: (!) 126/94 (!) 128/99  Pulse: 92 80  Resp: 18 18  Temp: 98.2 F (36.8 C) 98.6 F (37 C)  SpO2: 100% 100%   General: Pt is alert, awake, not in acute distress Cardiovascular: RRR, S1/S2 +,  no rubs, no gallops Respiratory: CTA bilaterally, no wheezing, no rhonchi Abdominal: Soft, NT, ND, bowel sounds + Extremities: No edema, no cyanosis  Labs: BNP (last 3 results) No results for input(s): BNP in the last 8760 hours. Basic Metabolic Panel: Recent Labs  Lab 05/02/18 1718 05/04/18 1608 05/05/18 0248 05/06/18 0551 05/07/18 0432  NA 143 143 137 137 137  K 3.0* 4.3 3.6 3.6 3.8  CL 106 107 104 102 102  CO2 25 19* 25 26 28   GLUCOSE 115* 184* 134* 177* 124*  BUN <5* 7 5* <5* <5*  CREATININE 0.68 0.84 0.67 0.69 0.74  CALCIUM 8.5* 8.7* 8.1* 8.8* 8.6*  MG  --   --   --  1.5* 1.7   Liver Function Tests: Recent Labs  Lab 05/02/18 1718 05/04/18 1608 05/05/18 0248  05/06/18 0551 05/07/18 0432  AST 379* 485* 480* 304* 216*  ALT 100* 126* 115* 108* 101*  ALKPHOS 101 101 91 83 80  BILITOT 1.0 1.6* 1.9* 1.0 0.7  PROT 7.0 7.0 6.0* 6.1* 6.2*  ALBUMIN 3.5 3.6 3.0* 3.1* 3.1*   Recent Labs  Lab 05/04/18 1608  LIPASE 43   No results for input(s): AMMONIA in the last 168 hours. CBC: Recent Labs  Lab 05/02/18 1718 05/04/18 1608 05/05/18 0248 05/06/18 0551 05/07/18 0432  WBC 5.0 5.9 4.6 7.0 6.9  NEUTROABS 1.5* 4.5  --   --   --   HGB 11.6* 10.4* 8.9* 9.1* 9.2*  HCT 37.5 34.7* 29.6* 29.5* 30.0*  MCV 89.3 90.6 91.6 91.9 91.5  PLT 265 277 177 154 155   Cardiac Enzymes: No results for input(s): CKTOTAL, CKMB, CKMBINDEX, TROPONINI in the last 168 hours. BNP: Invalid input(s): POCBNP CBG: No results for input(s): GLUCAP in the last 168 hours. D-Dimer No results for input(s): DDIMER in the last 72 hours. Hgb A1c No results for input(s): HGBA1C in the last 72 hours. Lipid Profile No results for input(s): CHOL, HDL, LDLCALC, TRIG, CHOLHDL, LDLDIRECT in the last 72 hours. Thyroid function studies Recent Labs    05/07/18 0432  TSH 2.434   Anemia work up No results for input(s): VITAMINB12, FOLATE, FERRITIN, TIBC, IRON, RETICCTPCT in the last 72 hours. Urinalysis    Component Value Date/Time   COLORURINE AMBER (A) 05/04/2018 1608   APPEARANCEUR CLOUDY (A) 05/04/2018 1608   LABSPEC 1.025 05/04/2018 1608   PHURINE 6.0 05/04/2018 1608   GLUCOSEU 50 (A) 05/04/2018 1608   HGBUR NEGATIVE 05/04/2018 1608   BILIRUBINUR NEGATIVE 05/04/2018 1608   KETONESUR 80 (A) 05/04/2018 1608   PROTEINUR 30 (A) 05/04/2018 1608   NITRITE NEGATIVE 05/04/2018 1608   LEUKOCYTESUR NEGATIVE 05/04/2018 1608    Microbiology Recent Results (from the past 240 hour(s))  Culture, Urine     Status: None   Collection Time: 05/04/18  4:11 PM  Result Value Ref Range Status   Specimen Description URINE, CLEAN CATCH  Final   Special Requests NONE  Final   Culture    Final    NO GROWTH Performed at [facility], [place] [address] [place], [place] [address]    Report Status 05/05/2018 FINAL  Final    Time coordinating discharge: Approximately 40 minutes  Patrecia Pour, MD  [facility] 05/07/2018, 4:24 PM Pager [phone]

## 2018-05-20 DIAGNOSIS — I808 Phlebitis and thrombophlebitis of other sites: Secondary | ICD-10-CM | POA: Insufficient documentation

## 2018-05-20 DIAGNOSIS — D649 Anemia, unspecified: Secondary | ICD-10-CM | POA: Insufficient documentation

## 2018-05-20 DIAGNOSIS — G510 Bell's palsy: Secondary | ICD-10-CM | POA: Insufficient documentation

## 2018-06-10 NOTE — L&D Delivery Note (Signed)
Delivery Note Labor onset: 04/12/19 Labor Onset Time: 0553 Complete dilation at 1:38 AM  Onset of pushing at 0139 FHR second stage: Cat 2 Analgesia/Anesthesia intrapartum: Epidural  Delivery of a viable female at [facility] by Clois Dupes, CNM and attended by Dr. Alwyn Pea. Fetal head delivered in direct OP position and restituted to ROT. Nuchal cord N/A. Infant dried, bulb suction, and tactile stim. Spont cry noted, NICU team in room. Cord double clamped and cut by Marylyn Ishihara, FOB. Infant to radiant warmer for immediate assessment.     Arterial and venous gases collected.  Cord blood sample collected.  Placenta delivered [facility] side, intact, with 3 VC.  Placenta to pathology. Uterine tone firm w/ massage and small bleeding.   No lacerations identified.  Anesthesia: Epidural Repair N/A QBL/EBL (mL): 641 mL Complications: None  APGAR: APGAR (1 MIN):   APGAR (5 MINS):   APGAR (10 MINS):   Weight 1500g (3# 4.9oz)  Mom to Couplet care room in NICU.  Baby to NICU.  Arrie Eastern MSN, CNM 04/13/2019, 2:14 AM

## 2018-11-05 ENCOUNTER — Other Ambulatory Visit: Payer: Self-pay

## 2018-11-05 ENCOUNTER — Other Ambulatory Visit (HOSPITAL_COMMUNITY): Payer: Self-pay

## 2018-11-05 DIAGNOSIS — O09529 Supervision of elderly multigravida, unspecified trimester: Secondary | ICD-10-CM

## 2018-11-05 DIAGNOSIS — Z3400 Encounter for supervision of normal first pregnancy, unspecified trimester: Secondary | ICD-10-CM

## 2018-11-05 DIAGNOSIS — N979 Female infertility, unspecified: Secondary | ICD-10-CM

## 2018-11-05 DIAGNOSIS — Z3481 Encounter for supervision of other normal pregnancy, first trimester: Secondary | ICD-10-CM

## 2018-11-17 ENCOUNTER — Ambulatory Visit (HOSPITAL_COMMUNITY): Payer: Self-pay

## 2018-11-17 ENCOUNTER — Encounter (HOSPITAL_COMMUNITY): Payer: Self-pay

## 2019-01-08 ENCOUNTER — Other Ambulatory Visit: Payer: Self-pay

## 2019-01-08 DIAGNOSIS — Z20822 Contact with and (suspected) exposure to covid-19: Secondary | ICD-10-CM

## 2019-01-10 LAB — NOVEL CORONAVIRUS, NAA: SARS-CoV-2, NAA: NOT DETECTED

## 2019-01-16 ENCOUNTER — Other Ambulatory Visit: Payer: Self-pay

## 2019-01-16 ENCOUNTER — Inpatient Hospital Stay (HOSPITAL_COMMUNITY): Payer: Medicaid Other

## 2019-01-16 ENCOUNTER — Inpatient Hospital Stay (HOSPITAL_COMMUNITY)
Admission: AD | Admit: 2019-01-16 | Discharge: 2019-01-18 | DRG: 818 | Disposition: A | Discharge status: Home / Self Care | Payer: Medicaid Other | Attending: Obstetrics and Gynecology | Admitting: Obstetrics and Gynecology

## 2019-01-16 ENCOUNTER — Encounter (HOSPITAL_COMMUNITY): Payer: Self-pay

## 2019-01-16 DIAGNOSIS — Z20828 Contact with and (suspected) exposure to other viral communicable diseases: Secondary | ICD-10-CM | POA: Diagnosis present

## 2019-01-16 DIAGNOSIS — O99312 Alcohol use complicating pregnancy, second trimester: Secondary | ICD-10-CM | POA: Diagnosis present

## 2019-01-16 DIAGNOSIS — B373 Candidiasis of vulva and vagina: Secondary | ICD-10-CM | POA: Diagnosis present

## 2019-01-16 DIAGNOSIS — O2622 Pregnancy care for patient with recurrent pregnancy loss, second trimester: Secondary | ICD-10-CM

## 2019-01-16 DIAGNOSIS — Z3A18 18 weeks gestation of pregnancy: Secondary | ICD-10-CM

## 2019-01-16 DIAGNOSIS — R030 Elevated blood-pressure reading, without diagnosis of hypertension: Secondary | ICD-10-CM | POA: Diagnosis present

## 2019-01-16 DIAGNOSIS — O99012 Anemia complicating pregnancy, second trimester: Secondary | ICD-10-CM | POA: Diagnosis not present

## 2019-01-16 DIAGNOSIS — Z7289 Other problems related to lifestyle: Secondary | ICD-10-CM

## 2019-01-16 DIAGNOSIS — O26892 Other specified pregnancy related conditions, second trimester: Secondary | ICD-10-CM | POA: Diagnosis present

## 2019-01-16 DIAGNOSIS — Z789 Other specified health status: Secondary | ICD-10-CM

## 2019-01-16 DIAGNOSIS — O9989 Other specified diseases and conditions complicating pregnancy, childbirth and the puerperium: Secondary | ICD-10-CM

## 2019-01-16 DIAGNOSIS — Z87891 Personal history of nicotine dependence: Secondary | ICD-10-CM | POA: Diagnosis not present

## 2019-01-16 DIAGNOSIS — O4692 Antepartum hemorrhage, unspecified, second trimester: Secondary | ICD-10-CM | POA: Diagnosis not present

## 2019-01-16 DIAGNOSIS — O98812 Other maternal infectious and parasitic diseases complicating pregnancy, second trimester: Secondary | ICD-10-CM | POA: Diagnosis present

## 2019-01-16 DIAGNOSIS — O24312 Unspecified pre-existing diabetes mellitus in pregnancy, second trimester: Secondary | ICD-10-CM | POA: Diagnosis not present

## 2019-01-16 DIAGNOSIS — O3432 Maternal care for cervical incompetence, second trimester: Secondary | ICD-10-CM | POA: Diagnosis present

## 2019-01-16 DIAGNOSIS — O209 Hemorrhage in early pregnancy, unspecified: Secondary | ICD-10-CM | POA: Diagnosis present

## 2019-01-16 LAB — URINALYSIS, ROUTINE W REFLEX MICROSCOPIC
Bilirubin Urine: NEGATIVE
Glucose, UA: NEGATIVE mg/dL
Ketones, ur: NEGATIVE mg/dL
Leukocytes,Ua: NEGATIVE
Nitrite: NEGATIVE
Protein, ur: NEGATIVE mg/dL
Specific Gravity, Urine: 1.003 — ABNORMAL LOW (ref 1.005–1.030)
pH: 7 (ref 5.0–8.0)

## 2019-01-16 LAB — CBC WITH DIFFERENTIAL/PLATELET
Abs Immature Granulocytes: 0.02 10*3/uL (ref 0.00–0.07)
Basophils Absolute: 0.1 10*3/uL (ref 0.0–0.1)
Basophils Relative: 1 %
Eosinophils Absolute: 0 10*3/uL (ref 0.0–0.5)
Eosinophils Relative: 0 %
HCT: 34.2 % — ABNORMAL LOW (ref 36.0–46.0)
Hemoglobin: 11.2 g/dL — ABNORMAL LOW (ref 12.0–15.0)
Immature Granulocytes: 0 %
Lymphocytes Relative: 23 %
Lymphs Abs: 1.5 10*3/uL (ref 0.7–4.0)
MCH: 30.4 pg (ref 26.0–34.0)
MCHC: 32.7 g/dL (ref 30.0–36.0)
MCV: 92.7 fL (ref 80.0–100.0)
Monocytes Absolute: 0.5 10*3/uL (ref 0.1–1.0)
Monocytes Relative: 7 %
Neutro Abs: 4.6 10*3/uL (ref 1.7–7.7)
Neutrophils Relative %: 69 %
Platelets: 273 10*3/uL (ref 150–400)
RBC: 3.69 MIL/uL — ABNORMAL LOW (ref 3.87–5.11)
RDW: 17.1 % — ABNORMAL HIGH (ref 11.5–15.5)
WBC: 6.8 10*3/uL (ref 4.0–10.5)
nRBC: 0 % (ref 0.0–0.2)

## 2019-01-16 LAB — TYPE AND SCREEN
ABO/RH(D): A POS
Antibody Screen: NEGATIVE

## 2019-01-16 LAB — WET PREP, GENITAL
Clue Cells Wet Prep HPF POC: NONE SEEN
Sperm: NONE SEEN
Trich, Wet Prep: NONE SEEN

## 2019-01-16 LAB — SARS CORONAVIRUS 2 BY RT PCR (HOSPITAL ORDER, PERFORMED IN ~~LOC~~ HOSPITAL LAB): SARS Coronavirus 2: NEGATIVE

## 2019-01-16 MED ORDER — DOCUSATE SODIUM 100 MG PO CAPS
100.0000 mg | ORAL_CAPSULE | Freq: Every day | ORAL | Status: DC
  Administered 2019-01-16 – 2019-01-17 (×2): 100 mg via ORAL
  Filled 2019-01-16 (×3): qty 1

## 2019-01-16 MED ORDER — MICONAZOLE NITRATE 200 MG VA SUPP
200.0000 mg | Freq: Every day | VAGINAL | Status: DC
  Administered 2019-01-16 – 2019-01-17 (×2): 200 mg via VAGINAL
  Filled 2019-01-16 (×3): qty 3

## 2019-01-16 MED ORDER — ACETAMINOPHEN 325 MG PO TABS: 650.0000 mg | ORAL_TABLET | ORAL | Status: DC | PRN

## 2019-01-16 MED ORDER — DOXYLAMINE SUCCINATE (SLEEP) 25 MG PO TABS
25.0000 mg | ORAL_TABLET | Freq: Two times a day (BID) | ORAL | Status: DC | PRN
  Administered 2019-01-16 – 2019-01-17 (×2): 25 mg via ORAL
  Filled 2019-01-16 (×2): qty 1

## 2019-01-16 MED ORDER — SODIUM CHLORIDE 0.9 % IV SOLN
3.0000 g | Freq: Four times a day (QID) | INTRAVENOUS | Status: DC
  Administered 2019-01-16 – 2019-01-18 (×8): 3 g via INTRAVENOUS
  Filled 2019-01-16 (×8): qty 8

## 2019-01-16 MED ORDER — CALCIUM CARBONATE ANTACID 500 MG PO CHEW: 2.0000 | CHEWABLE_TABLET | ORAL | Status: DC | PRN

## 2019-01-16 MED ORDER — SODIUM CHLORIDE 0.9 % IV SOLN
INTRAVENOUS | Status: DC
  Administered 2019-01-16 – 2019-01-18 (×4): via INTRAVENOUS

## 2019-01-16 MED ORDER — INDOMETHACIN 25 MG PO CAPS
50.0000 mg | ORAL_CAPSULE | Freq: Once | ORAL | Status: AC
  Administered 2019-01-16: 50 mg via ORAL
  Filled 2019-01-16: qty 2

## 2019-01-16 MED ORDER — PRENATAL MULTIVITAMIN CH: 1.0000 | ORAL_TABLET | Freq: Every day | ORAL | Status: DC

## 2019-01-16 MED ORDER — ZOLPIDEM TARTRATE 5 MG PO TABS: 5.0000 mg | ORAL_TABLET | Freq: Every evening | ORAL | Status: DC | PRN

## 2019-01-16 MED ORDER — VITAMIN B-6 25 MG PO TABS
25.0000 mg | ORAL_TABLET | Freq: Two times a day (BID) | ORAL | Status: DC | PRN
  Administered 2019-01-16 – 2019-01-17 (×2): 25 mg via ORAL
  Filled 2019-01-16 (×2): qty 1

## 2019-01-16 MED ORDER — INDOMETHACIN 25 MG PO CAPS
25.0000 mg | ORAL_CAPSULE | Freq: Four times a day (QID) | ORAL | Status: DC
  Administered 2019-01-17 – 2019-01-18 (×7): 25 mg via ORAL
  Filled 2019-01-16 (×7): qty 1

## 2019-01-16 NOTE — H&P (Signed)
History     CSN: [id number]  Arrival date and time: 01/16/19 1225   First Provider Initiated Contact with Patient 01/16/19 1312         Chief Complaint  Patient presents with  . Abdominal Pain  . Vaginal Bleeding   HPI  Ms. Susan [name] is a 38 y.o. G4P0030 at 33w6dwho presents to MAU today with complaint of vaginal bleeding and abdominal cramping. She states that she [place] in early pregnancy that was normal. She had one previous episode of bleeding after intercourse, but none since then. Last sex was Thursday. She states cramping started yesterday and bleeding started early this morning. She rates her pain at 5/10 and has not taken anything for pain. She has used 2 pads today but they have not been filled. She had a fall last weekend through a step and did not have any evaluation after that. She denies UTI symptoms. She continues to have N/V and is taking Diclegis with some relief. The patient states that she has had 3 SABs in the past. Two were prior to 38years old and required D&C. The last was a few years ago and was first trimester due to blighted ovum.            OB History    Gravida  4   Para      Term      Preterm      AB  3   Living        SAB  3   TAB      Ectopic      Multiple      Live Births                  Past Medical History:  Diagnosis Date  . Alcohol abuse   . Depression   . Family history of adverse reaction to anesthesia    MOTHER HAD PANIC ATTACKS & NAUSEA  AFTER  . Gastritis   . GIB (gastrointestinal bleeding)   . Iron deficiency anemia   . Pancreatitis          Past Surgical History:  Procedure Laterality Date  . ESOPHAGOGASTRODUODENOSCOPY (EGD) WITH PROPOFOL N/A 06/05/2016   Procedure: ESOPHAGOGASTRODUODENOSCOPY (EGD) WITH PROPOFOL;  Surgeon: POtis Brace MD;  Location: [facility];  Service: Gastroenterology;  Laterality: N/A;  . PLACEMENT OF BREAST IMPLANTS            Family History  Problem Relation Age of Onset  . Stroke Mother     Social History        Tobacco Use  . Smoking status: Former SResearch scientist (life sciences) . Smokeless tobacco: Never Used  Substance Use Topics  . Alcohol use: Not Currently    Comment: occasional  . Drug use: No    Allergies:       Allergies  Allergen Reactions  . Effexor [Venlafaxine] Palpitations and Other (See Comments)    Panic attacks  . Latex Rash    Tape irritates the skin/ please use paper tape  . Tape Rash    pls use paper tape           Medications Prior to Admission  Medication Sig Dispense Refill Last Dose  . FERROUS GLUCONATE PO Take 1 tablet by mouth daily.     .Marland KitchenMAGNESIUM PO Take 1 tablet by mouth daily.     .Marland KitchenPOTASSIUM PO Take 1 tablet by mouth daily.  Review of Systems  Constitutional: Negative for fever.  Gastrointestinal: Positive for abdominal pain, nausea and vomiting. Negative for constipation and diarrhea.  Genitourinary: Positive for vaginal bleeding and vaginal discharge. Negative for dysuria, frequency and urgency.   Physical Exam   Blood pressure 128/85, pulse 85, temperature 98.7 F (37.1 C), temperature source Oral, resp. rate 16, height 5' 2.5" (1.588 m), weight 64.5 kg, last menstrual period 04/14/2018, SpO2 100 %.  Physical Exam  Nursing note and vitals reviewed. Constitutional: She is oriented to person, place, and time. She appears well-developed and well-nourished. No distress.  HENT:  Head: Normocephalic and atraumatic.  Cardiovascular: Normal rate.  Respiratory: Effort normal.  GI: Soft. She exhibits no distension and no mass. There is abdominal tenderness (mild tenderness to palpation of the RLQ). There is no rebound and no guarding.  Genitourinary: Uterus is enlarged. Cervix exhibits no motion tenderness and no friability.    Vaginal discharge and bleeding (scant, pink) present.  There is bleeding (scant, pink) in the vagina.  Neurological: She  is alert and oriented to person, place, and time.  Skin: Skin is warm and dry. No erythema.  Psychiatric: She has a normal mood and affect.  Dilation: Fingertip Cervical Position: Middle Exam by:: Susan Hough, PA-C   Lab Results Last 24 Hours       Results for orders placed or performed during the hospital encounter of 01/16/19 (from the past 24 hour(s))  CBC with Differential/Platelet     Status: Abnormal   Collection Time: 01/16/19  2:33 PM  Result Value Ref Range   WBC 6.8 4.0 - 10.5 K/uL   RBC 3.69 (L) 3.87 - 5.11 MIL/uL   Hemoglobin 11.2 (L) 12.0 - 15.0 g/dL   HCT 34.2 (L) 36.0 - 46.0 %   MCV 92.7 80.0 - 100.0 fL   MCH 30.4 26.0 - 34.0 pg   MCHC 32.7 30.0 - 36.0 g/dL   RDW 17.1 (H) 11.5 - 15.5 %   Platelets 273 150 - 400 K/uL   nRBC 0.0 0.0 - 0.2 %   Neutrophils Relative % 69 %   Neutro Abs 4.6 1.7 - 7.7 K/uL   Lymphocytes Relative 23 %   Lymphs Abs 1.5 0.7 - 4.0 K/uL   Monocytes Relative 7 %   Monocytes Absolute 0.5 0.1 - 1.0 K/uL   Eosinophils Relative 0 %   Eosinophils Absolute 0.0 0.0 - 0.5 K/uL   Basophils Relative 1 %   Basophils Absolute 0.1 0.0 - 0.1 K/uL   Immature Granulocytes 0 %   Abs Immature Granulocytes 0.02 0.00 - 0.07 K/uL  Urinalysis, Routine w reflex microscopic     Status: Abnormal   Collection Time: 01/16/19  2:50 PM  Result Value Ref Range   Color, Urine YELLOW YELLOW   APPearance HAZY (A) CLEAR   Specific Gravity, Urine 1.003 (L) 1.005 - 1.030   pH 7.0 5.0 - 8.0   Glucose, UA NEGATIVE NEGATIVE mg/dL   Hgb urine dipstick MODERATE (A) NEGATIVE   Bilirubin Urine NEGATIVE NEGATIVE   Ketones, ur NEGATIVE NEGATIVE mg/dL   Protein, ur NEGATIVE NEGATIVE mg/dL   Nitrite NEGATIVE NEGATIVE   Leukocytes,Ua NEGATIVE NEGATIVE   RBC / HPF 0-5 0 - 5 RBC/hpf   WBC, UA 0-5 0 - 5 WBC/hpf   Bacteria, UA RARE (A) NONE SEEN   Squamous Epithelial / LPF 0-5 0 - 5      MAU Course  Procedures None  MDM FHR -  150 bpm with doppler  UA, CBC and Limited US today  Dr. Donalee Citrin called with [place] report. Recommends observation x 24-48 hours and then possible rescue cerclage if patient is no longer having bleeding or pain. See report for full recommendations.  Called Dr. Mancel Bale and informed her of patient presentation and [place] results. Admit to [facility] to monitor pain and bleeding. Start Indomethacin. Requests wet prep and GC/Chlamydia and urine culture. Will consider antibiotics based on those results.  Wet prep, GC/Chlamydia collected.   Assessment and Plan  A: SIUP at 43w6dPremature cervical dilation   P: Admit to [facility]. Plan as noted above.   JKerry Hough PA-C 01/16/2019, 3:14 PM   1850 lengthy discussion with patient and FOB.  Informed them of u/s findings and discussed POC.  Will treat yeast infection and start antibiotics as per Dr. SJaquita Rectornote.  Cultures pending.  As long as pt is stable without bleeding and cramping, will plan to place cerclage on Monday.  R/B/A of cerclage discussed as well as 17P even though no h/o PTL and delivery.  Questions answered and they seem to understand the plan well.  Pt reports h/o 2 early SABs, 1 early 2nd trimester SAB/blighted ovum (per pt) and no h/o surgery on her cervix.  Cont indomethacin.  Pt reports only brown discharge now with some pinkish discharge earlier after admission and still with some cramping that may be mildly improved since indomethacin.

## 2019-01-16 NOTE — MAU Provider Note (Signed)
History     CSN: [id number]  Arrival date and time: 01/16/19 1225   First Provider Initiated Contact with Patient 01/16/19 1312      Chief Complaint  Patient presents with  . Abdominal Pain  . Vaginal Bleeding   HPI  Ms. Susan [name] is a 38 y.o. G4P0030 at [redacted]w[redacted]d who presents to MAU today with complaint of vaginal bleeding and abdominal cramping. She states that she had an [place] in the office in early pregnancy that was normal. She had one previous episode of bleeding after intercourse, but none since then. Last sex was Thursday. She states cramping started yesterday and bleeding started early this morning. She rates her pain at 5/10 and has not taken anything for pain. She has used 2 pads today but they have not been filled. She had a fall last weekend through a step and did not have any evaluation after that. She denies UTI symptoms. She continues to have N/V and is taking Diclegis with some relief. The patient states that she has had 3 SABs in the past. Two were prior to 38 years old and required D&C. The last was a few years ago and was first trimester due to blighted ovum.    OB History    Gravida  4   Para      Term      Preterm      AB  3   Living        SAB  3   TAB      Ectopic      Multiple      Live Births              Past Medical History:  Diagnosis Date  . Alcohol abuse   . Depression   . Family history of adverse reaction to anesthesia    MOTHER HAD PANIC ATTACKS & NAUSEA  AFTER  . Gastritis   . GIB (gastrointestinal bleeding)   . Iron deficiency anemia   . Pancreatitis     Past Surgical History:  Procedure Laterality Date  . ESOPHAGOGASTRODUODENOSCOPY (EGD) WITH PROPOFOL N/A 06/05/2016   Procedure: ESOPHAGOGASTRODUODENOSCOPY (EGD) WITH PROPOFOL;  Surgeon: Otis Brace, MD;  Location: [facility];  Service: Gastroenterology;  Laterality: N/A;  . PLACEMENT OF BREAST IMPLANTS      Family History  Problem Relation Age of Onset  .  Stroke Mother     Social History   Tobacco Use  . Smoking status: Former Research scientist (life sciences)  . Smokeless tobacco: Never Used  Substance Use Topics  . Alcohol use: Not Currently    Comment: occasional  . Drug use: No    Allergies:  Allergies  Allergen Reactions  . Effexor [Venlafaxine] Palpitations and Other (See Comments)    Panic attacks  . Latex Rash    Tape irritates the skin/ please use paper tape  . Tape Rash    pls use paper tape    Medications Prior to Admission  Medication Sig Dispense Refill Last Dose  . FERROUS GLUCONATE PO Take 1 tablet by mouth daily.     Marland Kitchen MAGNESIUM PO Take 1 tablet by mouth daily.     Marland Kitchen POTASSIUM PO Take 1 tablet by mouth daily.       Review of Systems  Constitutional: Negative for fever.  Gastrointestinal: Positive for abdominal pain, nausea and vomiting. Negative for constipation and diarrhea.  Genitourinary: Positive for vaginal bleeding and vaginal discharge. Negative for dysuria, frequency and urgency.   Physical Exam  Blood pressure 128/85, pulse 85, temperature 98.7 F (37.1 C), temperature source Oral, resp. rate 16, height 5' 2.5" (1.588 m), weight 64.5 kg, last menstrual period 04/14/2018, SpO2 100 %.  Physical Exam  Nursing note and vitals reviewed. Constitutional: She is oriented to person, place, and time. She appears well-developed and well-nourished. No distress.  HENT:  Head: Normocephalic and atraumatic.  Cardiovascular: Normal rate.  Respiratory: Effort normal.  GI: Soft. She exhibits no distension and no mass. There is abdominal tenderness (mild tenderness to palpation of the RLQ). There is no rebound and no guarding.  Genitourinary: Uterus is enlarged. Cervix exhibits no motion tenderness and no friability.    Vaginal discharge and bleeding (scant, pink) present.  There is bleeding (scant, pink) in the vagina.  Neurological: She is alert and oriented to person, place, and time.  Skin: Skin is warm and dry. No erythema.   Psychiatric: She has a normal mood and affect.  Dilation: Fingertip Cervical Position: Middle Exam by:: Vonzella NippleJulie [name] Peretz, PA-C   Results for orders placed or performed during the hospital encounter of 01/16/19 (from the past 24 hour(s))  CBC with Differential/Platelet     Status: Abnormal   Collection Time: 01/16/19  2:33 PM  Result Value Ref Range   WBC 6.8 4.0 - 10.5 K/uL   RBC 3.69 (L) 3.87 - 5.11 MIL/uL   Hemoglobin 11.2 (L) 12.0 - 15.0 g/dL   HCT 16.134.2 (L) 09.636.0 - 04.546.0 %   MCV 92.7 80.0 - 100.0 fL   MCH 30.4 26.0 - 34.0 pg   MCHC 32.7 30.0 - 36.0 g/dL   RDW 40.917.1 (H) 81.111.5 - 91.415.5 %   Platelets 273 150 - 400 K/uL   nRBC 0.0 0.0 - 0.2 %   Neutrophils Relative % 69 %   Neutro Abs 4.6 1.7 - 7.7 K/uL   Lymphocytes Relative 23 %   Lymphs Abs 1.5 0.7 - 4.0 K/uL   Monocytes Relative 7 %   Monocytes Absolute 0.5 0.1 - 1.0 K/uL   Eosinophils Relative 0 %   Eosinophils Absolute 0.0 0.0 - 0.5 K/uL   Basophils Relative 1 %   Basophils Absolute 0.1 0.0 - 0.1 K/uL   Immature Granulocytes 0 %   Abs Immature Granulocytes 0.02 0.00 - 0.07 K/uL  Urinalysis, Routine w reflex microscopic     Status: Abnormal   Collection Time: 01/16/19  2:50 PM  Result Value Ref Range   Color, Urine YELLOW YELLOW   APPearance HAZY (A) CLEAR   Specific Gravity, Urine 1.003 (L) 1.005 - 1.030   pH 7.0 5.0 - 8.0   Glucose, UA NEGATIVE NEGATIVE mg/dL   Hgb urine dipstick MODERATE (A) NEGATIVE   Bilirubin Urine NEGATIVE NEGATIVE   Ketones, ur NEGATIVE NEGATIVE mg/dL   Protein, ur NEGATIVE NEGATIVE mg/dL   Nitrite NEGATIVE NEGATIVE   Leukocytes,Ua NEGATIVE NEGATIVE   RBC / HPF 0-5 0 - 5 RBC/hpf   WBC, UA 0-5 0 - 5 WBC/hpf   Bacteria, UA RARE (A) NONE SEEN   Squamous Epithelial / LPF 0-5 0 - 5    MAU Course  Procedures None  MDM FHR - 150 bpm with doppler  UA, CBC and Limited US today  Dr. Judeth CornfieldShankar called with US report. Recommends observation x 24-48 hours and then possible rescue cerclage if patient is  no longer having bleeding or pain. See report for full recommendations.  Called Dr. Su Hiltoberts and informed her of patient presentation and US results. [facility]. Start Indomethacin. Requests wet prep and GC/Chlamydia and urine culture. Will consider antibiotics based on those results.  Wet prep, GC/Chlamydia collected.   Assessment and Plan  A: SIUP at [redacted]w[redacted]d Premature cervical dilation   P: Admit to [facility]. Plan as noted above.   Vonzella Nipple, PA-C 01/16/2019, 3:14 PM

## 2019-01-16 NOTE — MAU Note (Signed)
Susan [name] is a 38 y.o. at [redacted]w[redacted]d here in MAU reporting: had some cramping yesterday into today and started bleeding this morning. Has changed a pad 2 times today, states they were not completely saturated. Now the bleeding looks more brown. Had sex on Thursday. Was a [facility] patient but switched to [facility], thinks due date is 06/13/19 but not 100% sure.  Onset of complaint: yesterday  Pain score: 5/10  Vitals:   01/16/19 1248  BP: (!) 145/102  Pulse: (!) 104  Resp: 16  Temp: 98.7 F (37.1 C)  SpO2: 100%      Lab orders placed from triage: UA, pt used bathroom while in lobby unable to give sample now

## 2019-01-17 DIAGNOSIS — R030 Elevated blood-pressure reading, without diagnosis of hypertension: Secondary | ICD-10-CM | POA: Diagnosis not present

## 2019-01-17 DIAGNOSIS — F1090 Alcohol use, unspecified, uncomplicated: Secondary | ICD-10-CM

## 2019-01-17 DIAGNOSIS — Z789 Other specified health status: Secondary | ICD-10-CM

## 2019-01-17 DIAGNOSIS — Z7289 Other problems related to lifestyle: Secondary | ICD-10-CM

## 2019-01-17 LAB — COMPREHENSIVE METABOLIC PANEL
ALT: 27 U/L (ref 0–44)
AST: 65 U/L — ABNORMAL HIGH (ref 15–41)
Albumin: 2.9 g/dL — ABNORMAL LOW (ref 3.5–5.0)
Alkaline Phosphatase: 43 U/L (ref 38–126)
Anion gap: 12 (ref 5–15)
BUN: <5 mg/dL — ABNORMAL LOW (ref 6–20)
CO2: 22 mmol/L (ref 22–32)
Calcium: 8.6 mg/dL — ABNORMAL LOW (ref 8.9–10.3)
Chloride: 101 mmol/L (ref 98–111)
Creatinine, Ser: 0.6 mg/dL (ref 0.44–1.00)
GFR calc Af Amer: >60 mL/min (ref >60)
GFR calc non Af Amer: >60 mL/min (ref >60)
Glucose, Bld: 120 mg/dL — ABNORMAL HIGH (ref 70–99)
Potassium: 3.5 mmol/L (ref 3.5–5.1)
Sodium: 135 mmol/L (ref 135–145)
Total Bilirubin: 1 mg/dL (ref 0.3–1.2)
Total Protein: 6.9 g/dL (ref 6.5–8.1)

## 2019-01-17 LAB — LACTATE DEHYDROGENASE: LDH: 157 U/L (ref 98–192)

## 2019-01-17 LAB — URIC ACID: Uric Acid, Serum: 3.8 mg/dL (ref 2.5–7.1)

## 2019-01-17 NOTE — Progress Notes (Signed)
IUP @ 19 0/7 week Short cervix w/ funneling HD#2  Pt reports that the cramping has decreased significantly and that bleeding is minimal.  Temp:  [98.1 F (36.7 C)-98.7 F (37.1 C)] 98.2 F (36.8 C) (08/09 0829) Pulse Rate:  [69-104] 69 (08/09 0829) Resp:  [16-18] 18 (08/09 0829) BP: (128-145)/(85-102) 138/94 (08/09 0829) SpO2:  [100 %] 100 % (08/09 0829) Weight:  [64.5 kg] 64.5 kg (08/08 1241)   Gen:  NAD, comfortable. Abd:  Gravid, Nontender Ext:  No edema, no calf tenderness  A/P IUP @ 19 0/7 weeks Short cervix with funneling-Vaginal bleeding resolving and cramping decreased.  Continue Indomethacin.  If continues to improve, cerclage tomorrow.  Saline lock for now.  Start IVF when NPO after midnight. Elevated BP-Not severe.  No history of hypertension.  Continue to observe.  D/w CNM.

## 2019-01-17 NOTE — Progress Notes (Signed)
Hospital day # 1 pregnancy at [redacted]w[redacted]d--with IUP and short cervix.  S:  Pt in bed and stable with BF @ bedside. Talked with pt about history of pancreatitis and alcohol abuse in past, pt admits to drinking 2-3 glasses's of wine with dinners in one setting one-two times weekly. BF goes to virtual AA meetings with has been in [place] and in [place] for three years. Pt endorses that she doesn't have a problem and is able to keep it under control now. When asked if anyone had talked with her about FAS she declined. Education and gentle encouragement were given along with coping mechanism and ideas of curving alcohol, pt now aware that no amount of alcohol is considered safe in pregnancy. Pt currently denies stress, mood stable, denies HA, RUQ pain or vision changes.      Perception of contractions: none, irregular, every 1-10 minutes, feels like menstrual cramps      Vaginal bleeding: none now and brown when wiping over night       Vaginal discharge:  no significant change  O: BP (!) 138/94 (BP Location: Right Arm)   Pulse 69   Temp 98.2 F (36.8 C) (Oral)   Resp 18   Ht 5' 2.5" (1.588 m)   Wt 64.5 kg   LMP 04/14/2018   SpO2 100%   BMI 25.61 kg/m       Fetal Hear Tones: 152      Contractions: None palpable.         Uterus gravid and non-tender      Extremities: extremities normal, atraumatic, no cyanosis or edema and no significant edema and no signs of DVT          Labs:  SARS neg, hgb 11.2, hct 34.2, plat 273 upon admission.         Meds: unasyn, indocin 25mg  po Q6H for cramps, miconazole vaginal suppositories. .   A/P: IUP @ 19 0/7 weeks, stable now, with bleeding upon admission and no active bleeding now.   Short cervix with funneling-Vaginal bleeding: resolving and cramping decreased.             Continue Indomethacin, and unasyn in case of infection while pending UC.              If continues to improve, cerclage tomorrow, NPO after midnight.             Saline lock for now. Start IVF when  NPO after midnight. Pending G/C/UC.  Elevated BP-Not severe: BP ranges from 140s/100s-130s/90s. Pt denies h/o HTN, has FH of H/O. Labs  placed to rule out preeclampsia and have baseline labs. No meds indicated at this time and  asymptomatic.  Continue to observe.  H/O alcohol abuse and current alcohol use during pregnancy: Pt drinks 2-3 glassless of wine during  entire's pregnancy weekly. Pt denies having alcohol problem and endorses doing much better.  Education given. Monitor. Pt presents of DT.   Yeast: +budding yeast on wet prep, vaginal miconazole given with vaginal inserts, pt denclined cream or  pills, denies itching.         MDs will follow  [employer], MSN 01/17/2019 10:21 AM

## 2019-01-18 ENCOUNTER — Encounter (HOSPITAL_COMMUNITY)
Admission: AD | Disposition: A | Discharge status: Home / Self Care | Payer: Self-pay | Source: Home / Self Care | Attending: Obstetrics and Gynecology

## 2019-01-18 ENCOUNTER — Inpatient Hospital Stay (HOSPITAL_COMMUNITY): Payer: Medicaid Other | Admitting: Anesthesiology

## 2019-01-18 ENCOUNTER — Encounter (HOSPITAL_COMMUNITY): Payer: Self-pay | Admitting: Anesthesiology

## 2019-01-18 HISTORY — PX: CERVICAL CERCLAGE: SHX1329

## 2019-01-18 LAB — PROTEIN / CREATININE RATIO, URINE
Creatinine, Urine: 122.96 mg/dL
Protein Creatinine Ratio: 0.14 mg/mg{Cre} (ref 0.00–0.15)
Total Protein, Urine: 17 mg/dL

## 2019-01-18 SURGERY — CERCLAGE, CERVIX, VAGINAL APPROACH
Anesthesia: Spinal | Laterality: Bilateral

## 2019-01-18 MED ORDER — SODIUM CHLORIDE (PF) 0.9 % IJ SOLN
Freq: Once | INTRAMUSCULAR | Status: DC
  Filled 2019-01-18: qty 1

## 2019-01-18 MED ORDER — ONDANSETRON HCL 4 MG/2ML IJ SOLN
INTRAMUSCULAR | Status: DC | PRN
  Administered 2019-01-18: 4 mg via INTRAVENOUS

## 2019-01-18 MED ORDER — PROGESTERONE MICRONIZED 100 MG PO CAPS: 100.0000 mg | ORAL_CAPSULE | Freq: Every day | ORAL | 3 refills | Status: DC

## 2019-01-18 MED ORDER — MEPERIDINE HCL 25 MG/ML IJ SOLN: 6.2500 mg | INTRAMUSCULAR | Status: DC | PRN

## 2019-01-18 MED ORDER — BUPIVACAINE IN DEXTROSE 0.75-8.25 % IT SOLN
INTRATHECAL | Status: DC | PRN
  Administered 2019-01-18: .8 mL via INTRATHECAL

## 2019-01-18 MED ORDER — LACTATED RINGERS IV SOLN
INTRAVENOUS | Status: DC | PRN
  Administered 2019-01-18: 15:00:00 via INTRAVENOUS

## 2019-01-18 MED ORDER — ASPIRIN EC 81 MG PO TBEC: 81.0000 mg | DELAYED_RELEASE_TABLET | Freq: Every day | ORAL | 1 refills | Status: DC

## 2019-01-18 MED ORDER — FENTANYL CITRATE (PF) 100 MCG/2ML IJ SOLN: 25.0000 ug | INTRAMUSCULAR | Status: DC | PRN

## 2019-01-18 MED ORDER — LABETALOL HCL 100 MG PO TABS: 100.0000 mg | ORAL_TABLET | Freq: Two times a day (BID) | ORAL | 3 refills | Status: DC

## 2019-01-18 MED ORDER — SODIUM CHLORIDE (PF) 0.9 % IJ SOLN
INTRAMUSCULAR | Status: DC | PRN
  Administered 2019-01-18: 150 mL via VAGINAL

## 2019-01-18 MED ORDER — LABETALOL HCL 100 MG PO TABS
100.0000 mg | ORAL_TABLET | Freq: Two times a day (BID) | ORAL | Status: DC
  Administered 2019-01-18: 100 mg via ORAL
  Filled 2019-01-18 (×2): qty 1

## 2019-01-18 SURGICAL SUPPLY — 18 items
CANISTER SUCT 3000ML PPV (MISCELLANEOUS) ×2 IMPLANT
GLOVE BIO SURGEON STRL SZ7.5 (GLOVE) ×2 IMPLANT
GLOVE BIOGEL PI IND STRL 7.0 (GLOVE) ×1 IMPLANT
GLOVE BIOGEL PI IND STRL 7.5 (GLOVE) ×1 IMPLANT
GLOVE BIOGEL PI INDICATOR 7.0 (GLOVE) ×1
GLOVE BIOGEL PI INDICATOR 7.5 (GLOVE) ×1
GOWN STRL REUS W/TWL LRG LVL3 (GOWN DISPOSABLE) ×4 IMPLANT
NS IRRIG 1000ML POUR BTL (IV SOLUTION) ×2 IMPLANT
PACK VAGINAL MINOR WOMEN LF (CUSTOM PROCEDURE TRAY) ×2 IMPLANT
PAD OB MATERNITY 4.3X12.25 (PERSONAL CARE ITEMS) ×2 IMPLANT
PAD PREP 24X48 CUFFED NSTRL (MISCELLANEOUS) ×2 IMPLANT
SUT MERSILENE 5MM BP 1 12 (SUTURE) ×2 IMPLANT
SUT PROLENE 1 CT 1 30 (SUTURE) ×2 IMPLANT
SYR BULB IRRIGATION 50ML (SYRINGE) ×2 IMPLANT
TOWEL OR 17X24 6PK STRL BLUE (TOWEL DISPOSABLE) ×2 IMPLANT
TRAY FOLEY W/BAG SLVR 14FR (SET/KITS/TRAYS/PACK) ×2 IMPLANT
TUBING NON-CON 1/4 X 20 CONN (TUBING) IMPLANT
YANKAUER SUCT BULB TIP NO VENT (SUCTIONS) IMPLANT

## 2019-01-18 NOTE — Anesthesia Postprocedure Evaluation (Signed)
Anesthesia Post Note  Patient: Susan [name]  Procedure(s) Performed: CERCLAGE CERVICAL (Bilateral )     Patient location during evaluation: PACU Anesthesia Type: Spinal Level of consciousness: awake Pain management: pain level controlled Vital Signs Assessment: post-procedure vital signs reviewed and stable Respiratory status: spontaneous breathing Cardiovascular status: stable Postop Assessment: no headache, spinal receding, patient able to bend at knees and no apparent nausea or vomiting Anesthetic complications: no    Last Vitals:  Vitals:   01/18/19 1700 01/18/19 1715  BP: (!) 134/103 (!) 154/102  Pulse: 79 68  Resp: 18 20  Temp:    SpO2: 100% 100%    Last Pain:  Vitals:   01/18/19 1700  TempSrc:   PainSc: 2    Pain Goal:                Epidural/Spinal Function Cutaneous sensation: Tingles (01/18/19 1715), Patient able to flex knees: Yes (01/18/19 1715), Patient able to lift hips off bed: Yes (01/18/19 1715), Back pain beyond tenderness at insertion site: No (01/18/19 1715), Progressively worsening motor and/or sensory loss: No (01/18/19 1715), Bowel and/or bladder incontinence post epidural: No (01/18/19 1715)  Huston Foley

## 2019-01-18 NOTE — Transfer of Care (Signed)
Immediate Anesthesia Transfer of Care Note  Patient: Susan [name]  Procedure(s) Performed: CERCLAGE CERVICAL (Bilateral )  Patient Location: PACU  Anesthesia Type:Spinal  Level of Consciousness: awake, alert  and oriented  Airway & Oxygen Therapy: Patient Spontanous Breathing  Post-op Assessment: Report given to RN and Post -op Vital signs reviewed and stable  Post vital signs: Reviewed and stable  Last Vitals:  Vitals Value Taken Time  BP 141/96 01/18/19 1623  Temp    Pulse 73 01/18/19 1625  Resp 28 01/18/19 1625  SpO2 100 % 01/18/19 1625  Vitals shown include unvalidated device data.  Last Pain:  Vitals:   01/18/19 1301  TempSrc: Oral  PainSc:          Complications: No apparent anesthesia complications

## 2019-01-18 NOTE — Progress Notes (Signed)
Ambulated to BR with SBA and voided q.s. Ate bagel and tolerated well.

## 2019-01-18 NOTE — Discharge Summary (Addendum)
Physician Discharge Summary  Patient ID: Susan [name] MRN: [id number] DOB/AGE: [birth year] 38 y.o.  Admit date: 01/16/2019 Discharge date: 01/18/2019  Admission Diagnoses: 18 6/7 wks Bleeding and Cramping Cervical Incompetence  Discharge Diagnoses:  Active Problems:   Premature cervical dilation in second trimester   Alcohol use   Elevated BP without diagnosis of hypertension   Discharged Condition: fair  Hospital Course: After being placed on indomethacin and unasyn, the pt's bleeding and cramping stopped.  A cervical cerclage was placed without incident.  Discussions for alcohol cessation, bedrest and vaginal progesterone discussed with patient.  R/B/A reviewed and pt agreeable to vaginal progesterone and will do bedrest/pelvic rest and will try to stop drinking alcohol.  The pt verbalized to [facility], CNM that she drinks 2-3 glasses of wine/wk.  Consults: None and ultrasound  Significant Diagnostic Studies: n/a  Treatments: surgery: cervical cerclage  Discharge Exam: Blood pressure (!) 145/96, pulse 80, temperature 98.5 F (36.9 C), temperature source Oral, resp. rate 16, height 5' 2.5" (1.588 m), weight 64.5 kg, last menstrual period 04/14/2018, SpO2 99 %. General appearance: alert and no distress Resp: clear to auscultation bilaterally Cardio: regular rate and rhythm Extremities: no calf tenderness  Disposition: Discharge disposition: 01-Home or Self Care        Allergies as of 01/18/2019      Reactions   Effexor [venlafaxine] Palpitations, Other (See Comments)   Panic attacks   Latex Rash   Tape irritates the skin/ please use paper tape   Tape Rash   pls use paper tape     Home on  Prometrium per vagina at bedtime 52m Aspirin by mouth daily Labetalol 1058mby mouth twice daily      [facility]2019.   Specialty: Obstetrics and Gynecology Why: Keep scheduled appt on  01-25-2019 Contact information: [address] [place] [phone]          Signed: AnDelice Lesch/03/2019, 4:16 PM

## 2019-01-18 NOTE — Anesthesia Preprocedure Evaluation (Signed)
Anesthesia Evaluation  Patient identified by MRN, date of birth, ID band Patient awake    Reviewed: Allergy & Precautions, H&P , NPO status , Patient's Chart, lab work & pertinent test results  Airway Mallampati: I  TM Distance: >3 FB Neck ROM: full    Dental no notable dental hx. (+) Teeth Intact   Pulmonary former smoker,    Pulmonary exam normal breath sounds clear to auscultation       Cardiovascular negative cardio ROS Normal cardiovascular exam Rhythm:regular Rate:Normal     Neuro/Psych negative neurological ROS     GI/Hepatic negative GI ROS,   Endo/Other  negative endocrine ROS  Renal/GU      Musculoskeletal   Abdominal Normal abdominal exam  (+)   Peds  Hematology  (+) Blood dyscrasia, anemia ,   Anesthesia Other Findings   Reproductive/Obstetrics (+) Pregnancy                             Anesthesia Physical Anesthesia Plan  ASA: II  Anesthesia Plan: Spinal   Post-op Pain Management:    Induction:   PONV Risk Score and Plan: Treatment may vary due to age or medical condition  Airway Management Planned: Natural Airway and Nasal Cannula  Additional Equipment: None  Intra-op Plan:   Post-operative Plan:   Informed Consent: I have reviewed the patients History and Physical, chart, labs and discussed the procedure including the risks, benefits and alternatives for the proposed anesthesia with the patient or authorized representative who has indicated his/her understanding and acceptance.       Plan Discussed with: CRNA  Anesthesia Plan Comments:         Anesthesia Quick Evaluation

## 2019-01-18 NOTE — Op Note (Signed)
Preop Diagnosis: Rescue Cervical Cerclage   Postop Diagnosis: Rescue Cervical Cerclage   Procedure: Cervical Cerclage   Anesthesia: Spinal   Anesthesiologist: Lyn Hollingshead, MD   Attending: Everett Graff, MD   Assistant: N/a  Findings: Cervix 1cm/75%/high  Pathology: N/a  Fluids: 1000 cc  UOP:  EBL: 5cc  Complications: None  Procedure:Then patient was taken to the operating room after the risks, benefits and alternatives discussed with the patient and consent signed and witnessed.  The patient was given a spinal per anesthesia and placed in the dorsal lithotomy position.  The patient was prepped and draped in the usual sterile fashion.  A cervical cerclage stitch was placed using Mersilene and the knot was tied anteriorly on the cervix.  Clindamycin douche was performed.  Membranes remained intact and post procedure fetal heart rate was 160.  Sponge, lap and needle count was correct and the patient was transferred to the recovery room in good condition.

## 2019-01-18 NOTE — Anesthesia Procedure Notes (Signed)
Spinal  Patient location during procedure: OR Start time: 01/18/2019 3:13 PM End time: 01/18/2019 3:16 PM Staffing Anesthesiologist: Lyn Hollingshead, MD Performed: anesthesiologist  Preanesthetic Checklist Completed: patient identified, site marked, surgical consent, pre-op evaluation, timeout performed, IV checked, risks and benefits discussed and monitors and equipment checked Spinal Block Patient position: sitting Prep: site prepped and draped and DuraPrep Patient monitoring: continuous pulse ox and blood pressure Approach: midline Location: L4-5 Injection technique: single-shot Needle Needle type: Pencan  Needle gauge: 24 G Needle length: 10 cm Needle insertion depth: 4 cm Assessment Sensory level: T12

## 2019-01-18 NOTE — OR Nursing (Signed)
FHR in RR following cerclage FHR 160 per doppler done by Marisa Hua RN

## 2019-01-18 NOTE — Progress Notes (Signed)
Hospital day # 2 pregnancy at [redacted]w[redacted]d--with IUP and short cervix.  S:  Pt stable in bed sleeping, denies HA, vision changes or RUQ pain, denies bleeding or cxt over night, talked with RN PCR urine still pending in lab, RN to investigate and make sure it was sent. Perception of contractions: none. Vaginal bleeding: none. Vaginal discharge:  No.   O: BP (!) 133/91 (BP Location: Right Arm)   Pulse 81   Temp 98.3 F (36.8 C) (Oral)   Resp 17   Ht 5' 2.5" (1.588 m)   Wt 64.5 kg   LMP 04/14/2018   SpO2 100%   BMI 25.61 kg/m       Fetal Hear Tones: 152      Contractions: None palpable.         Uterus gravid and non-tender      Extremities: extremities normal, atraumatic, no cyanosis or edema and no significant edema and no signs of DVT          Labs:  SARS neg, hgb 11.2, hct 34.2, plat 273 upon admission.         Meds: unasyn, indocin 25mg  po Q6H for cramps, miconazole vaginal suppositories. .   A/P: IUP @ 19 0/7 weeks, stable now, with bleeding upon admission and no active bleeding now.   Short cervix with funneling-Vaginal bleeding: Bleeding and cramps resolved.             Continue Indomethacin, and unasyn in case of infection while pending UC.              Cerclage today with Dr Mancel Bale @ 3pm., NPO. IV Fluids.             Pending G/C/UC.  Elevated BP-Not severe: BP ranges from 140s/100s-130s/90s. Pt denies h/o HTN, has FH of H/O. Labs  resulted neg, with AST @ 65, but way down from alcohol hepatitis back in nov 2019. PCR urine still  pending, RN to investigate. No meds indicated at this time and asymptomatic.  Continue to observe.  H/O alcohol abuse and current alcohol use during pregnancy: Pt drinks 2-3 glassless of wine during  entire's pregnancy weekly. Pt denies having alcohol problem and endorses doing much better.  Education given. Monitor Pt for presents of DT.   Yeast: +budding yeast on wet prep, vaginal miconazole given with vaginal inserts, pt denclined cream or  pills, denies  itching.   Dr Mancel Bale given report @ 0700 and assumed care.        MDs will follow  [place], MSN 01/18/2019 6:37 AM

## 2019-01-19 LAB — GC/CHLAMYDIA PROBE AMP (~~LOC~~) NOT AT ARMC
Chlamydia: NEGATIVE
Neisseria Gonorrhea: NEGATIVE

## 2019-01-20 LAB — DRUG PROFILE, UR, 9 DRUGS (LABCORP)
Amphetamines, Urine: NEGATIVE ng/mL
Barbiturate, Ur: NEGATIVE ng/mL
Benzodiazepine Quant, Ur: NEGATIVE ng/mL
Cannabinoid Quant, Ur: NEGATIVE ng/mL
Cocaine (Metab.): NEGATIVE ng/mL
Methadone Screen, Urine: NEGATIVE ng/mL
Opiate Quant, Ur: NEGATIVE ng/mL
Phencyclidine, Ur: NEGATIVE ng/mL
Propoxyphene, Urine: NEGATIVE ng/mL

## 2019-02-16 ENCOUNTER — Other Ambulatory Visit: Payer: Self-pay

## 2019-02-16 ENCOUNTER — Inpatient Hospital Stay (HOSPITAL_COMMUNITY)
Admission: AD | Admit: 2019-02-16 | Discharge: 2019-02-16 | Disposition: A | Discharge status: Home / Self Care | Payer: Medicaid Other | Attending: Obstetrics and Gynecology | Admitting: Obstetrics and Gynecology

## 2019-02-16 DIAGNOSIS — O3430 Maternal care for cervical incompetence, unspecified trimester: Secondary | ICD-10-CM | POA: Diagnosis present

## 2019-02-16 DIAGNOSIS — Z3A00 Weeks of gestation of pregnancy not specified: Secondary | ICD-10-CM | POA: Insufficient documentation

## 2019-02-16 MED ORDER — BETAMETHASONE SOD PHOS & ACET 6 (3-3) MG/ML IJ SUSP
12.0000 mg | Freq: Once | INTRAMUSCULAR | Status: AC
  Administered 2019-02-16: 12 mg via INTRAMUSCULAR
  Filled 2019-02-16: qty 2

## 2019-02-16 NOTE — MAU Note (Signed)
Dr Cletis Media called, was sending pt in for Betamethasone injection today and tomorrow.Susan [name]

## 2019-02-17 ENCOUNTER — Inpatient Hospital Stay (HOSPITAL_COMMUNITY)
Admission: AD | Admit: 2019-02-17 | Discharge: 2019-02-17 | Disposition: A | Discharge status: Home / Self Care | Payer: Medicaid Other | Attending: Obstetrics and Gynecology | Admitting: Obstetrics and Gynecology

## 2019-02-17 DIAGNOSIS — Z3A00 Weeks of gestation of pregnancy not specified: Secondary | ICD-10-CM | POA: Diagnosis not present

## 2019-02-17 DIAGNOSIS — O3430 Maternal care for cervical incompetence, unspecified trimester: Secondary | ICD-10-CM | POA: Insufficient documentation

## 2019-02-17 MED ORDER — BETAMETHASONE SOD PHOS & ACET 6 (3-3) MG/ML IJ SUSP
12.0000 mg | Freq: Once | INTRAMUSCULAR | Status: DC
  Filled 2019-02-17: qty 2

## 2019-02-17 NOTE — MAU Note (Signed)
PT presents to MAU for 2nd dose of BMZ. Denies any complaints

## 2019-03-01 MED FILL — Betamethasone Sod Phosphate & Acetate Inj Susp 6 (3-3) MG/ML: INTRAMUSCULAR | Qty: 2 | Status: AC

## 2019-03-19 ENCOUNTER — Encounter: Payer: Self-pay | Admitting: *Deleted

## 2019-03-22 ENCOUNTER — Ambulatory Visit (INDEPENDENT_AMBULATORY_CARE_PROVIDER_SITE_OTHER): Payer: Medicaid Other

## 2019-03-22 ENCOUNTER — Other Ambulatory Visit: Payer: Self-pay

## 2019-03-22 VITALS — BP 99/71 | HR 101 | Wt 157.5 lb

## 2019-03-22 DIAGNOSIS — O3430 Maternal care for cervical incompetence, unspecified trimester: Secondary | ICD-10-CM

## 2019-03-22 DIAGNOSIS — Z3A28 28 weeks gestation of pregnancy: Secondary | ICD-10-CM | POA: Diagnosis not present

## 2019-03-22 DIAGNOSIS — O3433 Maternal care for cervical incompetence, third trimester: Secondary | ICD-10-CM | POA: Diagnosis present

## 2019-03-22 MED ORDER — BETAMETHASONE SOD PHOS & ACET 6 (3-3) MG/ML IJ SUSP
12.0000 mg | INTRAMUSCULAR | Status: AC
  Administered 2019-03-22 – 2019-03-23 (×2): 12 mg via INTRAMUSCULAR

## 2019-03-22 NOTE — Progress Notes (Signed)
Patient seen and assessed by nursing staff.  Agree with documentation and plan.  

## 2019-03-22 NOTE — Progress Notes (Signed)
Chelcie R Galla here for Betamethasone  Injection.  Injection administered without complication. Patient will return in 24 hours for next injection.  Annabell Howells, RN 03/22/2019  9:18 AM

## 2019-03-23 ENCOUNTER — Ambulatory Visit (INDEPENDENT_AMBULATORY_CARE_PROVIDER_SITE_OTHER): Payer: Medicaid Other | Admitting: Emergency Medicine

## 2019-03-23 DIAGNOSIS — Z3A28 28 weeks gestation of pregnancy: Secondary | ICD-10-CM | POA: Diagnosis not present

## 2019-03-23 DIAGNOSIS — O3432 Maternal care for cervical incompetence, second trimester: Secondary | ICD-10-CM | POA: Diagnosis present

## 2019-03-23 DIAGNOSIS — O3430 Maternal care for cervical incompetence, unspecified trimester: Secondary | ICD-10-CM

## 2019-03-23 NOTE — Progress Notes (Signed)
Chart reviewed for nurse visit. Agree with plan of care.   Susan [name], [place] 03/23/2019 2:10 PM

## 2019-03-23 NOTE — Progress Notes (Signed)
Susan [name] here for Betamethasone  Injection.  Injection administered without complication. BP 114/80.   [name], [place] 03/23/19 [phone]

## 2019-04-02 ENCOUNTER — Inpatient Hospital Stay (HOSPITAL_COMMUNITY)
Admission: AD | Admit: 2019-04-02 | Discharge: 2019-04-14 | DRG: 768 | Disposition: A | Discharge status: Home / Self Care | Payer: Medicaid Other | Attending: Obstetrics & Gynecology | Admitting: Obstetrics & Gynecology

## 2019-04-02 ENCOUNTER — Encounter (HOSPITAL_COMMUNITY): Payer: Self-pay | Admitting: *Deleted

## 2019-04-02 ENCOUNTER — Inpatient Hospital Stay (HOSPITAL_COMMUNITY): Payer: Medicaid Other

## 2019-04-02 ENCOUNTER — Other Ambulatory Visit: Payer: Self-pay

## 2019-04-02 DIAGNOSIS — O09529 Supervision of elderly multigravida, unspecified trimester: Secondary | ICD-10-CM

## 2019-04-02 DIAGNOSIS — Z7289 Other problems related to lifestyle: Secondary | ICD-10-CM

## 2019-04-02 DIAGNOSIS — O99013 Anemia complicating pregnancy, third trimester: Secondary | ICD-10-CM | POA: Diagnosis not present

## 2019-04-02 DIAGNOSIS — O2623 Pregnancy care for patient with recurrent pregnancy loss, third trimester: Secondary | ICD-10-CM

## 2019-04-02 DIAGNOSIS — Z3A29 29 weeks gestation of pregnancy: Secondary | ICD-10-CM | POA: Diagnosis not present

## 2019-04-02 DIAGNOSIS — B9689 Other specified bacterial agents as the cause of diseases classified elsewhere: Secondary | ICD-10-CM | POA: Diagnosis present

## 2019-04-02 DIAGNOSIS — O24313 Unspecified pre-existing diabetes mellitus in pregnancy, third trimester: Secondary | ICD-10-CM | POA: Diagnosis not present

## 2019-04-02 DIAGNOSIS — B951 Streptococcus, group B, as the cause of diseases classified elsewhere: Secondary | ICD-10-CM | POA: Diagnosis present

## 2019-04-02 DIAGNOSIS — O9902 Anemia complicating childbirth: Secondary | ICD-10-CM | POA: Diagnosis present

## 2019-04-02 DIAGNOSIS — O42913 Preterm premature rupture of membranes, unspecified as to length of time between rupture and onset of labor, third trimester: Secondary | ICD-10-CM | POA: Diagnosis present

## 2019-04-02 DIAGNOSIS — O134 Gestational [pregnancy-induced] hypertension without significant proteinuria, complicating childbirth: Secondary | ICD-10-CM | POA: Diagnosis present

## 2019-04-02 DIAGNOSIS — I10 Essential (primary) hypertension: Secondary | ICD-10-CM | POA: Diagnosis present

## 2019-04-02 DIAGNOSIS — Z87891 Personal history of nicotine dependence: Secondary | ICD-10-CM

## 2019-04-02 DIAGNOSIS — Z20828 Contact with and (suspected) exposure to other viral communicable diseases: Secondary | ICD-10-CM | POA: Diagnosis present

## 2019-04-02 DIAGNOSIS — O09523 Supervision of elderly multigravida, third trimester: Secondary | ICD-10-CM | POA: Diagnosis not present

## 2019-04-02 DIAGNOSIS — O99824 Streptococcus B carrier state complicating childbirth: Secondary | ICD-10-CM | POA: Diagnosis present

## 2019-04-02 DIAGNOSIS — Z363 Encounter for antenatal screening for malformations: Secondary | ICD-10-CM

## 2019-04-02 DIAGNOSIS — D649 Anemia, unspecified: Secondary | ICD-10-CM | POA: Diagnosis present

## 2019-04-02 DIAGNOSIS — O09293 Supervision of pregnancy with other poor reproductive or obstetric history, third trimester: Secondary | ICD-10-CM | POA: Diagnosis not present

## 2019-04-02 DIAGNOSIS — O42919 Preterm premature rupture of membranes, unspecified as to length of time between rupture and onset of labor, unspecified trimester: Secondary | ICD-10-CM | POA: Diagnosis present

## 2019-04-02 DIAGNOSIS — O3433 Maternal care for cervical incompetence, third trimester: Secondary | ICD-10-CM | POA: Diagnosis present

## 2019-04-02 DIAGNOSIS — O24312 Unspecified pre-existing diabetes mellitus in pregnancy, second trimester: Secondary | ICD-10-CM | POA: Diagnosis not present

## 2019-04-02 DIAGNOSIS — O4100X0 Oligohydramnios, unspecified trimester, not applicable or unspecified: Secondary | ICD-10-CM | POA: Diagnosis not present

## 2019-04-02 DIAGNOSIS — Z3A30 30 weeks gestation of pregnancy: Secondary | ICD-10-CM | POA: Diagnosis not present

## 2019-04-02 DIAGNOSIS — R030 Elevated blood-pressure reading, without diagnosis of hypertension: Secondary | ICD-10-CM

## 2019-04-02 DIAGNOSIS — Z789 Other specified health status: Secondary | ICD-10-CM

## 2019-04-02 DIAGNOSIS — O1002 Pre-existing essential hypertension complicating childbirth: Secondary | ICD-10-CM | POA: Diagnosis present

## 2019-04-02 LAB — URINALYSIS, ROUTINE W REFLEX MICROSCOPIC
Bilirubin Urine: NEGATIVE
Glucose, UA: NEGATIVE mg/dL
Ketones, ur: NEGATIVE mg/dL
Nitrite: NEGATIVE
Protein, ur: NEGATIVE mg/dL
Specific Gravity, Urine: 1.006 (ref 1.005–1.030)
pH: 6 (ref 5.0–8.0)

## 2019-04-02 LAB — AMNISURE RUPTURE OF MEMBRANE (ROM) NOT AT ARMC: Amnisure ROM: POSITIVE

## 2019-04-02 LAB — WET PREP, GENITAL
Sperm: NONE SEEN
Trich, Wet Prep: NONE SEEN
Yeast Wet Prep HPF POC: NONE SEEN

## 2019-04-02 LAB — CBC
HCT: 31.5 % — ABNORMAL LOW (ref 36.0–46.0)
Hemoglobin: 10.2 g/dL — ABNORMAL LOW (ref 12.0–15.0)
MCH: 29.7 pg (ref 26.0–34.0)
MCHC: 32.4 g/dL (ref 30.0–36.0)
MCV: 91.6 fL (ref 80.0–100.0)
Platelets: 283 10*3/uL (ref 150–400)
RBC: 3.44 MIL/uL — ABNORMAL LOW (ref 3.87–5.11)
RDW: 14.9 % (ref 11.5–15.5)
WBC: 11.1 10*3/uL — ABNORMAL HIGH (ref 4.0–10.5)
nRBC: 0 % (ref 0.0–0.2)

## 2019-04-02 LAB — TYPE AND SCREEN
ABO/RH(D): A POS
Antibody Screen: NEGATIVE

## 2019-04-02 LAB — FETAL FIBRONECTIN: Fetal Fibronectin: POSITIVE — AB

## 2019-04-02 LAB — SARS CORONAVIRUS 2 BY RT PCR (HOSPITAL ORDER, PERFORMED IN ~~LOC~~ HOSPITAL LAB): SARS Coronavirus 2: NEGATIVE

## 2019-04-02 MED ORDER — SOD CITRATE-CITRIC ACID 500-334 MG/5ML PO SOLN: 30.0000 mL | ORAL | Status: DC | PRN

## 2019-04-02 MED ORDER — LACTATED RINGERS IV SOLN
500.0000 mL | INTRAVENOUS | Status: DC | PRN
  Administered 2019-04-02: 13:00:00 500 mL via INTRAVENOUS

## 2019-04-02 MED ORDER — ERYTHROMYCIN BASE 250 MG PO TABS
250.0000 mg | ORAL_TABLET | Freq: Three times a day (TID) | ORAL | Status: AC
  Administered 2019-04-04 – 2019-04-09 (×15): 250 mg via ORAL
  Filled 2019-04-02 (×15): qty 1

## 2019-04-02 MED ORDER — OXYTOCIN 40 UNITS IN NORMAL SALINE INFUSION - SIMPLE MED: 2.5000 [IU]/h | INTRAVENOUS | Status: DC

## 2019-04-02 MED ORDER — OXYCODONE-ACETAMINOPHEN 5-325 MG PO TABS: 2.0000 | ORAL_TABLET | ORAL | Status: DC | PRN

## 2019-04-02 MED ORDER — SODIUM CHLORIDE 0.9 % IV SOLN
250.0000 mg | Freq: Four times a day (QID) | INTRAVENOUS | Status: AC
  Administered 2019-04-02 – 2019-04-04 (×7): 250 mg via INTRAVENOUS
  Filled 2019-04-02 (×11): qty 5

## 2019-04-02 MED ORDER — LIDOCAINE HCL (PF) 1 % IJ SOLN
30.0000 mL | INTRAMUSCULAR | Status: AC | PRN
  Administered 2019-04-02: 13:00:00 30 mL via SUBCUTANEOUS
  Filled 2019-04-02: qty 30

## 2019-04-02 MED ORDER — MAGNESIUM SULFATE 40 GM/1000ML IV SOLN
2.0000 g/h | INTRAVENOUS | Status: DC
  Filled 2019-04-02: qty 1000

## 2019-04-02 MED ORDER — OXYTOCIN BOLUS FROM INFUSION: 500.0000 mL | Freq: Once | INTRAVENOUS | Status: DC

## 2019-04-02 MED ORDER — AMOXICILLIN 250 MG PO CAPS
250.0000 mg | ORAL_CAPSULE | Freq: Three times a day (TID) | ORAL | Status: AC
  Administered 2019-04-04 – 2019-04-09 (×15): 250 mg via ORAL
  Filled 2019-04-02 (×15): qty 1

## 2019-04-02 MED ORDER — OXYCODONE-ACETAMINOPHEN 5-325 MG PO TABS: 1.0000 | ORAL_TABLET | ORAL | Status: DC | PRN

## 2019-04-02 MED ORDER — ONDANSETRON HCL 4 MG/2ML IJ SOLN
4.0000 mg | Freq: Four times a day (QID) | INTRAMUSCULAR | Status: DC | PRN
  Administered 2019-04-02: 4 mg via INTRAVENOUS
  Filled 2019-04-02: qty 2

## 2019-04-02 MED ORDER — FLEET ENEMA 7-19 GM/118ML RE ENEM: 1.0000 | ENEMA | RECTAL | Status: DC | PRN

## 2019-04-02 MED ORDER — SODIUM CHLORIDE 0.9 % IV SOLN
2.0000 g | Freq: Four times a day (QID) | INTRAVENOUS | Status: AC
  Administered 2019-04-02 – 2019-04-04 (×8): 2 g via INTRAVENOUS
  Filled 2019-04-02 (×8): qty 2000

## 2019-04-02 MED ORDER — LACTATED RINGERS IV SOLN
INTRAVENOUS | Status: DC
  Administered 2019-04-02 (×3): via INTRAVENOUS

## 2019-04-02 MED ORDER — MAGNESIUM SULFATE BOLUS VIA INFUSION
6.0000 g | Freq: Once | INTRAVENOUS | Status: AC
  Administered 2019-04-02: 12:00:00 6 g via INTRAVENOUS
  Filled 2019-04-02: qty 1000

## 2019-04-02 MED ORDER — ACETAMINOPHEN 325 MG PO TABS
650.0000 mg | ORAL_TABLET | ORAL | Status: DC | PRN
  Administered 2019-04-04 – 2019-04-11 (×2): 650 mg via ORAL
  Filled 2019-04-02 (×3): qty 2

## 2019-04-02 NOTE — Progress Notes (Signed)
Cerclage removed by Dr. Alwyn Pea.

## 2019-04-02 NOTE — Consult Note (Addendum)
Neonatology Consult  Note:  At the request of the patients obstetrician Dr. Alwyn Pea I met with Susan [name] who is a 38 y.o. G5P0030 at 65w5dby LMP admitted for Preterm premature rupture of membranes.  She has experienced preterm labor after admission and her cerclage was removed today.  She has received IV magnesium sulfate for neuro prophylaxis and has already received betamethasone series.  She is receiving latency antibiotics. Pregnancy significant for short cervical length and incompetent cervix with cerclage placed @ 19 wks, PIH treated with Labetalol 100 mg daily, BMZ @ 24 and 28 wks, ETOH use in preg.   We reviewed initial delivery room management, including CPAP, , and low but certainly possible need for intubation for surfactant administration.  We discussed feeding immaturity and need for full po intake with multiple days of good weight gain and no apnea or bradycardia before discharge.  We reviewed increased risk of jaundice, infection, and temperature instability.   Discussed likely length of stay.  Thank you for allowing [place] participate in her care.  Please call with questions.  BHiginio Roger DO  Neonatologist   The total length of face-to-face or floor / unit time for this encounter was 25 minutes.  Counseling and / or coordination of care was greater than fifty percent of the time.

## 2019-04-02 NOTE — MAU Note (Signed)
PT SAYS AT [facility] FLUID COME OUT .-LARGE AMT.  THEN CALLED [facility].  LAST SEX-  NONE SINCE 17 WEEKS .  DENIES HSV AND MRSA.

## 2019-04-02 NOTE — Progress Notes (Addendum)
Susan [name] is a 38 y.o. G5P0030 at 60w5dadmitted for PPROM. Currently on MgSO4 for neuro protection. S/P BMZ x2 rounds,   Subjective: Reports nausea and fatigue w/ MgSO4, has been treated w/ Zofran. C/O mild, irregular ctx and endorses more uterine activity at night. Reports active FM.  Objective: BP 114/81   Pulse 89   Temp 98.3 F (36.8 C)   Resp 18   Ht 5' 2.5" (1.588 m)   Wt 73.5 kg   LMP 04/14/2018   SpO2 98%   Breastfeeding Unknown   BMI 29.16 kg/m  I/O last 3 completed shifts: In: 3213.1 [P.O.:1290; I.V.:1211.6; IV Piggyback:711.5] Out: 3025 [Urine:3025] Total I/O In: 1422.8 [P.O.:800; I.V.:522.8; IV Piggyback:100] Out: 1400 [Urine:1400]  FHT:  FHR: 145 bpm, variability: moderate,  accelerations:  Present,  decelerations:  Absent UC:   Irregular, TOCO adjusted SVE:   Dilation: 1.5 Effacement (%): 80 Station: -1 Exam by:: Pinn  Labs: Lab Results  Component Value Date   WBC 11.1 (H) 04/02/2019   HGB 10.2 (L) 04/02/2019   HCT 31.5 (L) 04/02/2019   MCV 91.6 04/02/2019   PLT 283 04/02/2019    Assessment / Plan: PPROM     -D/C MgSO4 @ 0000 Incompetent cervix      -cerclage removed Labor: Preterm labor Fetal Wellbeing:  Category I Pain Control:  None I/D:  GBS positive, Preterm prophylaxis, afebrile     VArrie EasternMSN, CNM 04/02/2019, 11:39 PM

## 2019-04-02 NOTE — H&P (Addendum)
S: Susan [name], 38 y.o., [id number], 61w5dpresents to MAU w/ c/o leaking fluid since 0500 after voiding. Endorses active FM, continuous leaking, denies vaginal bleeding and denies intercourse since 17 weeks. Pregnancy significant for short cervical length and incompetent cervix w/ cerclage placed @ 19 wks, PIH treated w/ Labetalol 100 mg daily, BMZ @ 24 and 28 wks, ETOH use in preg.   O: BP 123/82   Pulse 91   Temp 98 F (36.7 C) (Oral)   Resp 20   Ht 5' 2.5" (1.588 m)   Wt 73.5 kg   LMP 04/14/2018   Breastfeeding Unknown   BMI 29.16 kg/m  Labs  Prenatal Labs: ABO, Rh: --/--/A POS (11/02 1048) Antibody: NEG (11/02 1048) Rubella:  Immune  RPR: NON REACTIVE (10/23 1058)  HBsAg:  Negative  HIV: Non Reactive (11/25 1842)  GTT: Passed GBS:  Positive  GC/CHL: Negative Genetics: Low-risk Panorama, normal Horizon Past medical history:    Past Medical History:  Diagnosis Date  . Alcohol abuse   . Depression   . Family history of adverse reaction to anesthesia    MOTHER HAD PANIC ATTACKS & NAUSEA  AFTER  . Gastritis   . GIB (gastrointestinal bleeding)   . Iron deficiency anemia   . Pancreatitis     Past surgical history:  Past Surgical History:  Procedure Laterality Date  . CERVICAL CERCLAGE Bilateral 01/18/2019   Procedure: CERCLAGE CERVICAL;  Surgeon: REverett Graff MD;  Location: [facility] LD ORS;  Service: Gynecology;  Laterality: Bilateral;  . ESOPHAGOGASTRODUODENOSCOPY (EGD) WITH PROPOFOL N/A 06/05/2016   Procedure: ESOPHAGOGASTRODUODENOSCOPY (EGD) WITH PROPOFOL;  Surgeon: POtis Brace MD;  Location: [facility] ENDOSCOPY;  Service: Gastroenterology;  Laterality: N/A;  . PLACEMENT OF BREAST IMPLANTS     Family History:  Family History  Problem Relation Age of Onset  . Stroke Mother     Social History:  reports that she has quit smoking. She has never used smokeless tobacco. She reports previous alcohol use and continues to consume wine daily during pregnancy. She reports  that she does not use drugs.  Review of Systems: Constitutional: Negative.   HENT: Negative.   Eyes: Negative.   Respiratory: Negative.   Cardiovascular: Negative.   Gastrointestinal:   Genitourinary: Negative for bleeding, positive for leaking fluid  Musculoskeletal: Negative.   Skin: Negative.   Neurological: Negative.   Endo/Heme/Allergies: Negative.   Psychiatric/Behavioral: Negative.    Physical Exam: AAO x3, no signs of distress Cardiovascular: RRR Respiratory: Lung fields clear with ausculation GU/GI: Abdomen gravid, non-tender, non-distended, active FM, vertex per U/S on 10/22  Extremities: no edema, negative for pain, tenderness, and cords Spec exam: visually closed, cerclage suture noted  FHR: baseline 140 / variability moderate / accelerations present / absent decelerations TOCO: no uterine activity traced  Prenatal Transfer Tool  Maternal Diabetes: No Genetic Screening: Normal Maternal Ultrasounds/Referrals: Normal Fetal Ultrasounds or other Referrals:  None Maternal Substance Abuse:  Yes:  Type: Other: Hx of ETOH abuse Significant Maternal Medications:  Meds include: Progesterone Significant Maternal Lab Results: Group B Strep positive    A/P: 38y.o. G4P0030 269w5dossible PPROM     -fern negative     -wet prep, amnisure, U/S, and fFN pending Incompetent cervix     -cerclage in place     -S/P BMZ @ 24 and 28 wks PIH     -normotensive, managed on PO Labetalol 100 mg daily Cat I fetal surveillance   Plan reviewed with Dr. PiBarry Dienes  MSN, CNM 04/02/2019 8:23 AM

## 2019-04-02 NOTE — Consult Note (Signed)
MFM Consult  This patient was seen in consultation at the request of Dr Alwyn Pea due to [facility].  The patient is a 38 year old gravida 4 para 0-0-3-0 who is currently at 29 weeks 5 days.  Her current pregnancy has been complicated by a shortened cervix and possible incompetent cervix.  She had a cervical cerclage placed at 19 weeks of her current pregnancy.  The patient ruptured membranes early this morning.  Due to concerns that the patient was going into preterm labor, her cerclage was removed this afternoon.  The patient was placed on magnesium sulfate for fetal neuro protection.  She has already received 2 courses (initial and rescue course) of betamethasone prior to admission.  Her pregnancy has also been complicated by a history of chronic hypertension currently treated with labetalol 100 mg twice a day.  Her fetal heart rate tracing is currently reactive.  No contractions are noted on toco.  The patient had a limited ultrasound performed earlier today which showed that the fetus was in the vertex presentation.  An anterior placenta was noted.  The total AFI at the time of the ultrasound was 16.3 cm.  The typical management of patients with PPROM was reviewed.  I advised close monitoring of temperature and pulse and other signs for intramniotic infection and advocate delivery for any evidence of this.   The patient was advised that due to PPROM, she will remain in the hospital until delivery, with daily fetal testing.  As she has already received 2 courses of antenatal corticosteroids, no further steroid courses are recommended.    The patient may be discontinued from magnesium sulfate for fetal neuro protection after 12 hours.  Magnesium sulfate for fetal neuro protection should be restarted should she be at risk of delivery prior to 32 weeks.  She should be placed on latency antibiotics for 7 days.   Should she remain stable, delivery will be indicated at around 34 weeks. Delivery should occur at any  time should she go into spontaneous labor, should she show any signs of an intrauterine infection, or for non-reassuring fetal status.   The patient should continue labetalol 100 mg twice a day for treatment of chronic hypertension.  We will perform a fetal growth ultrasound next week should she remain undelivered.    Thank you for referring this very nice patient for a maternal-fetal medicine consultation.  A total of 30 minutes was spent with the patient, at least 15 minutes (>50%) of which involved face-to-face counseling and coordination of care.  Recommendations:  Inpatient management with daily fetal testing until delivery due to PPROM.   Continue latency antibiotics for 7 days.  Fetal growth scan next week should she remain undelivered.  Delivery at around 34 weeks or prior should she go into spontaneous labor show any signs of an intrauterine infection or at any time for nonreassuring fetal status.

## 2019-04-02 NOTE — Progress Notes (Signed)
MD Progress Note  Susan [name] is a 38 y.o. G5P0030 at 51w5dby LMP admitted for Preterm premature rupture of membranes with History of cervical cerclage in place.   Subjective: Patient feels more contraction pain.   Objective: BP 113/72   Pulse 79   Temp 98.4 F (36.9 C) (Oral)   Resp 18   Ht 5' 2.5" (1.588 m)   Wt 73.5 kg   LMP 04/14/2018   SpO2 100%   Breastfeeding Unknown   BMI 29.16 kg/m  No intake/output data recorded. Total I/O In: 342.3 [P.O.:60; I.V.:271.2; IV Piggyback:11.1] Out: 450 [Urine:450]  FHT:  Baseline fetal heart rate 145 moderate variability since admission, now minimal variability.            There was one prolonged decel nadir 80's lasting 2 minutes with return to baseline.  UC:   Irregular  SVE:   Dilation: 1.5 Effacement (%): 80 Station: -1 Exam by:: Bradey Luzier  Sterile speculum exam: Clear amniotic fluid, pooling Stitch held with ring forceps and knot cut Stitch removed.   There was some cervical bleeding around area of stitch - infiltrated with 1% plain  Lidocaine 5cc for possible repair, however bleeding stopped with pressure.   Labs: Lab Results  Component Value Date   WBC 11.1 (H) 04/02/2019   HGB 10.2 (L) 04/02/2019   HCT 31.5 (L) 04/02/2019   MCV 91.6 04/02/2019   PLT 283 04/02/2019    Assessment / Plan: SIUP at 29 weeks 5 days with preterm premature rupture of membranes There was a deceleration after removal of the cerclage stitch, improvement with positional change,  However variability diminished. Fetal well being Cat II Will give IV bolus Continue close monitoring IV magnesium sulfate on board for neuroprophylaxis Betamethasone series already given x 2 so no need to repeat Discussed preterm labor and delivery with patient and NICU admission Neonatal provider called and notified of patient and consultation placed into chart.  Latency antibiotics on board   Larenzo Caples, [place]/23/2020, 1:14 PM

## 2019-04-03 ENCOUNTER — Encounter (HOSPITAL_COMMUNITY): Payer: Self-pay

## 2019-04-03 LAB — RPR: RPR Ser Ql: NONREACTIVE

## 2019-04-03 MED ORDER — LABETALOL HCL 100 MG PO TABS
100.0000 mg | ORAL_TABLET | Freq: Two times a day (BID) | ORAL | Status: DC
  Administered 2019-04-03 (×2): 100 mg via ORAL
  Filled 2019-04-03 (×2): qty 1

## 2019-04-03 MED ORDER — PRENATAL MULTIVITAMIN CH
1.0000 | ORAL_TABLET | Freq: Every day | ORAL | Status: DC
  Administered 2019-04-03 – 2019-04-11 (×10): 1 via ORAL
  Filled 2019-04-03 (×8): qty 1

## 2019-04-03 MED ORDER — TERBUTALINE SULFATE 1 MG/ML IJ SOLN
0.2500 mg | Freq: Once | INTRAMUSCULAR | Status: DC | PRN
  Filled 2019-04-03: qty 1

## 2019-04-03 MED ORDER — CALCIUM CARBONATE ANTACID 500 MG PO CHEW
2.0000 | CHEWABLE_TABLET | ORAL | Status: DC | PRN
  Administered 2019-04-09 – 2019-04-12 (×3): 400 mg via ORAL
  Filled 2019-04-03 (×3): qty 2

## 2019-04-03 MED ORDER — METRONIDAZOLE 500 MG PO TABS
500.0000 mg | ORAL_TABLET | Freq: Two times a day (BID) | ORAL | Status: AC
  Administered 2019-04-03 – 2019-04-10 (×14): 500 mg via ORAL
  Filled 2019-04-03 (×14): qty 1

## 2019-04-03 MED ORDER — DOCUSATE SODIUM 100 MG PO CAPS
100.0000 mg | ORAL_CAPSULE | Freq: Every day | ORAL | Status: DC
  Administered 2019-04-03 – 2019-04-11 (×9): 100 mg via ORAL
  Filled 2019-04-03 (×9): qty 1

## 2019-04-03 MED ORDER — SODIUM CHLORIDE 0.9% FLUSH
3.0000 mL | Freq: Two times a day (BID) | INTRAVENOUS | Status: DC
  Administered 2019-04-04 – 2019-04-12 (×5): 3 mL via INTRAVENOUS

## 2019-04-03 MED ORDER — ZOLPIDEM TARTRATE 5 MG PO TABS
5.0000 mg | ORAL_TABLET | Freq: Every evening | ORAL | Status: DC | PRN
  Filled 2019-04-03: qty 1

## 2019-04-03 MED ORDER — SODIUM CHLORIDE 0.9% FLUSH: 3.0000 mL | INTRAVENOUS | Status: DC | PRN

## 2019-04-03 MED ORDER — SODIUM CHLORIDE 0.9 % IV SOLN: 250.0000 mL | INTRAVENOUS | Status: DC | PRN

## 2019-04-03 NOTE — Progress Notes (Signed)
Admission wet prep + for BV.  Will rx flagyl. Pt without any complaints when I rounded on her this afternoon.  Agree with VG note.

## 2019-04-03 NOTE — Progress Notes (Signed)
Subjective:    Feels better since MgSO4 was dc'd. Occasional cramping, but able to sleep through it.   Objective:    VS: BP 102/74   Pulse 84   Temp 98.4 F (36.9 C) (Oral)   Resp 20   Ht 5' 2.5" (1.588 m)   Wt 73.5 kg   LMP 04/14/2018   SpO2 98%   Breastfeeding Unknown   BMI 29.16 kg/m  FHR : baseline 135 / variability moderate / accelerations present / absent decelerations Toco: contractions irreg Membranes: SROM Dilation: 1.5 Effacement (%): 80 Cervical Position: Anterior Station: -1 Presentation: Vertex Exam by:: Pinn  Assessment/Plan:   38 y.o. G5P0030 57w6dPPROM Labor: Preterm labor Fetal Wellbeing:  Category I Pain Control:  None I/D:  GBS positive, Preterm prophylaxis, afebrile    VArrie EasternCNM, MSN 04/03/2019 3:16 AM

## 2019-04-03 NOTE — Progress Notes (Signed)
Subjective:    Reports an occasional, mild ctx, minimal loss of amniotic fluid, and active FM. Denies vaginal bleeding. Change in acuity discussed and pt agrees to being tx to [facility].   Objective:    VS: BP 120/73 (BP Location: Right Arm)   Pulse 91   Temp 98.7 F (37.1 C) (Oral)   Resp 16   Ht 5' 2.5" (1.588 m)   Wt 73.5 kg   LMP 04/14/2018   SpO2 99%   Breastfeeding Unknown   BMI 29.16 kg/m  FHR : baseline 135 / variability moderate / accelerations present / absent decelerations Toco: irregular contractions  Membranes: SROM x29 hours, scant, clear fluid Dilation: 1.5 Effacement (%): 80 Cervical Position: Anterior Station: -1 Presentation: Vertex Exam by:: Pinn  Assessment/Plan:   38 y.o. G5P0030 57w6dPPROM     -SROM 10/23 @ 0530  PTL     -stable     -MgSO4 for neuro protection complete     -S/P BMZ @ 24&28 wks     -Prophylactic IV ATB x 48 hrs, then PO  Incompetent cervix     -cerclage removed 10/23  ETOH abuse     -no S/S of withdrawals   VArrie EasternCNM, MSN 04/03/2019 3:48 PM

## 2019-04-04 NOTE — Progress Notes (Addendum)
Susan [name] 38 y.o. G5P0030 @[redacted]w[redacted]d , LOS# 2 for [facility] 10/23 @ 0530. Pt subsequent dx are BV, h/o alcohol abuse, incompetency cervix with PTL, and CHTN.   Subjective:   Pt was sitting up in bed, appears in NAD, pt endorses feeling small cxt from time to time, with increase vaginal clear fluids released, but pt endorses +FM, denies s/sx of infections, no vaginal bleeding. Denies vaginal itching, or smells. Denies drinking alcohol recently, denies neuro s/sx. Pt endorses earlier in the pregnancy pt was have social issues, which has now resolved, but states the partner she is not longer with, and he stressing wanting a paternity test when the baby is born.   Objective:    VS: BP 99/62 (BP Location: Right Arm)   Pulse 75   Temp 98 F (36.7 C) (Oral)   Resp 16   Ht 5' 2.5" (1.588 m)   Wt 73.5 kg   LMP 04/14/2018   SpO2 98%   Breastfeeding Unknown   BMI 29.16 kg/m   Physical Exam: AAO x3, no signs of distress Cardiovascular: RRR Respiratory: Lung fields clear with ausculation GU/GI: Abdomen gravid, non-tender, non-distended, active FM Extremities: no edema, negative for pain, tenderness, and cords Spec exam: visually closed, cerclage suture noted  FHR: baseline 140 / variability moderate / accelerations present / absent decelerations/Reactive, Cat 1  TOCO: no uterine activity traced  Last check was on 10/23 @ 1200 Dilation: 1.5 Effacement (%): 80 Cervical Position: Anterior Station: -1 Presentation: Vertex Exam by:: Pinn  Assessment/Plan:  Susan [name] 38 y.o. G5P0030 @[redacted]w[redacted]d , LOS# 2 for [facility] 10/23 @ 0530. Pt subsequent dx are BV, h/o alcohol abuse, incompetency cervix with PTL, and CHTN.  Incompetence cervix/PTL: pt stable now, over night had several cxt, but no bleeding, no cxt noted on toco, pt received 12 hours of mag for neuroprotectent, and cerclage was removed onm 10/23 due to fear of PTL, fFn + on 10/23, no cervical checks were performed today. If <34 weeks plan  to mag again if labor incurs.  Plan to use one time dose of procardia if cxt felt if no infection suspected to stop cxt.   PPROM: Pt stable now, fern neg, positive amnisure, but possible false pos due to active BV infection, pt endorses feeling fluid leak with cxt and has gotten worse, AFI showed 16.5 on the 10/23, pt on latency abx, just finished 48 hours of IV ampicillin and erythromycin, will continue for 3 days PO erythromycin and amoxicillin. Plan to keep pt until 34 weeks and IOL per MFM, but if AFI continue to fall WNL, pt is afebrile and NST are reactive, there may be a chance the pt could go home.   CHTN: Hold parameters placed on labetalol 100mg  BID for CHTN, under the impression pt elevated BP early in pregnancy was due to rebound htn from alcohol withdrawal. Pt has no required labetalol due to low BP, now 99/62, asymptomatic. Plan to discontinue labetalol today, and monitor.   H/O ETOH abuse: denies recent drinking, no S/S of withdrawals, stable.   BV: day #2 of flagyl, will complete x7 days. GC/C pending.  GBS+: positive in urine in October, does not have TOC on file, pedning UC now, plan to prophylactic tx during labor.   FWB: NST reactive, switch to QSHift NST, BMZ @ 24.28 weeks, [place] on 10/23 vertex, afi 16.3, growth scan on Monday.   Dr Charlesetta Garibaldi aware of POC and verbalized agreement, will report off @ 1900 to Dr Mancel Bale and Adonis Huguenin  CNM.   Dale Sidney CNM, MSN 04/04/2019 11:29 AM

## 2019-04-05 ENCOUNTER — Inpatient Hospital Stay (HOSPITAL_COMMUNITY): Payer: Medicaid Other

## 2019-04-05 DIAGNOSIS — O09293 Supervision of pregnancy with other poor reproductive or obstetric history, third trimester: Secondary | ICD-10-CM

## 2019-04-05 DIAGNOSIS — O24312 Unspecified pre-existing diabetes mellitus in pregnancy, second trimester: Secondary | ICD-10-CM

## 2019-04-05 DIAGNOSIS — O2623 Pregnancy care for patient with recurrent pregnancy loss, third trimester: Secondary | ICD-10-CM

## 2019-04-05 DIAGNOSIS — Z3A30 30 weeks gestation of pregnancy: Secondary | ICD-10-CM

## 2019-04-05 DIAGNOSIS — O99013 Anemia complicating pregnancy, third trimester: Secondary | ICD-10-CM | POA: Diagnosis not present

## 2019-04-05 DIAGNOSIS — O09523 Supervision of elderly multigravida, third trimester: Secondary | ICD-10-CM

## 2019-04-05 DIAGNOSIS — O42913 Preterm premature rupture of membranes, unspecified as to length of time between rupture and onset of labor, third trimester: Secondary | ICD-10-CM

## 2019-04-05 LAB — CULTURE, OB URINE

## 2019-04-05 NOTE — Progress Notes (Addendum)
Susan [name] 38 y.o. G4P0030 @[redacted]w[redacted]d , LOS# 3 for [facility] 10/23 @ 0530. Pt subsequent dx are BV, h/o alcohol abuse, incompetent cervix with PTL, and CHTN.   Subjective:  Reports a restful night and an occasional, mild ctx. Reports active FM and is continuing to leak fluid and changed pad twice during the night. Denies vaginal bleeding, abd tenderness, and malodorous discharge.   Objective: BP 119/72 (BP Location: Right Arm)   Pulse 81   Temp 98.3 F (36.8 C) (Oral)   Resp 20   Ht 5' 2.5" (1.588 m)   Wt 73.5 kg   LMP 04/14/2018   SpO2 99%   Breastfeeding Unknown   BMI 29.16 kg/m  I/O last 3 completed shifts: In: 600 [P.O.:600] Out: 2150 [Urine:2150] No intake/output data recorded.  FHT:  NST reactive, baseline 150, mod variability, accels present, decels variable x1 SVE:   Dilation: 1.5 Effacement (%): 80 Station: -1 Exam by:: [employer]: Lab Results  Component Value Date   WBC 11.1 (H) 04/02/2019   HGB 10.2 (L) 04/02/2019   HCT 31.5 (L) 04/02/2019   MCV 91.6 04/02/2019   PLT 283 04/02/2019    Assessment / Plan: PPROM     -SROM 10/23 @ 0530      Fetal well-being     -NST reactive     -growth U/S today  PTL     -stable     -MgSO4 for neuro protection complete     -S/P BMZ @ 24&28 wks     -Prophylactic IV ATB x 48 hrs, then PO  CHTN     -normotensive to hypotensive this admission, Labetalol dc'd on 10/25  GBS positive     -treat in active labor  BV     -Continue Flagyl  Incompetent cervix     -cerclage removed 10/23  Hx of ETOH abuse     -no S/S of withdrawals  Arrie Eastern MSN, CNM 04/05/2019, 5:46 AM  Patient seen and agreed Ultrasound reviewed with patient:  Vertex Fetal growth appears appropriate for her gestational age with EFW at 3 lbs 1 oz 15 %  Oligohydramnios with a total AFI of 3.36 cm was noted.  The views of the fetal anatomy were limited today due to  oligohydramnios and her advanced gestational age.  Questions  answered

## 2019-04-05 NOTE — Progress Notes (Addendum)
Hospital day # 6 pregnancy at [redacted]w[redacted]d-PPROM.  S:  Doing well.  Leaking fluid clear, Denies abdominal pain, or vaginal bleeding.  Normal bowel and bladder function. BM today.      Perception of contractions: none      Vaginal bleeding: none now       Vaginal discharge:  watery and no significant change  O: BP 119/72 (BP Location: Right Arm)   Pulse 83   Temp 97.7 F (36.5 C) (Oral)   Resp 18   Ht 5' 2.5" (1.588 m)   Wt 73.5 kg   LMP 04/14/2018   SpO2 97%   Breastfeeding Unknown   BMI 29.16 kg/m   [place] AFI 3.0, EFW 3 -1 15th percentile, vertex        Fetal tracings: reactive Cat 1      Contractions:   None      Uterus gravid, consistent with 30 weeks,  and non-tender      Extremities: extremities normal, atraumatic, no cyanosis or edema and no significant edema and no signs of DVT          Labs:  No new labs GBS positive       Meds: See MAR Betacomplete 24 and 28  A: 343w1dith PPROM     Stable Cat 1 strip GBS positive  P: Continue current plan of care Plan on induction at 34 weeks, if start labor will do Magnesium for neuroprotection.   Tx GBS in labor        NaStarla LinkNM, MSN 04/05/2019 11:03 PM  Agree with above.

## 2019-04-06 ENCOUNTER — Encounter (HOSPITAL_COMMUNITY): Payer: Self-pay

## 2019-04-06 LAB — TYPE AND SCREEN
ABO/RH(D): A POS
Antibody Screen: NEGATIVE

## 2019-04-07 NOTE — Progress Notes (Addendum)
LOS Day #7 Susan [name] G4 P0030 35w3dAdmitted for PPROM on 04/02/19 @ 0530 w/ PTL Hx of Incompetent cervix w/ cerclage placement @ 19 weeks, ETOH abuse, CHTN, BV  S: Slept well, reports an occasional, mild ctx. Continues to leak clear fluid. Reports active FM, denies vaginal bleeding.   O: Blood pressure 116/74, pulse 84, temperature 98.1 F (36.7 C), temperature source Oral, resp. rate 18, height 5' 2.5" (1.588 m), weight 73.5 kg, last menstrual period 04/14/2018, SpO2 100 %  Labs: GC and chlamydia results pending  Growth U/S on 04/06/19-- EFW 1380g (3# 1oz) 15%tile, AFI 3.36, Vertex, Anterior placenta  Phys Exam: AAO x3 GU/GI: Abd gravid, soft, non-tender, active FM Extremities: No edema, negative for pain, tenderness, or cords NST- baseline 150, moderate variability, 10x10 accel present, variable decel x1 TOCO- no ctx per TOCO VE: Deferred to reduce risk of infection, last exam 04/02/19: Dilation: 1.5 Effacement (%): 80 Cervical Position: Anterior Station: -1 Presentation: Vertex Exam by:: Pinn  A/P: 38y.o.G4 P0030 38w3dPROM     -stable, AFI 3.36     -afebrile     -continue oral antibiotics  Fetal well-being     -Reactive NST     -NST Q shift  GBS positive     -Treat w/ PCN in active labor CHTN     -normotensive, Latebetolol on hold  BV     -Continue Flagyl course  Plan on induction at 34 weeks, repeat Magnesium for neuro protection if spont labor occurs   Plan discussed w/ Dr. DiCharlesetta Garibaldi   ViBurman FosterMSN, CNM 04/07/2019 8:12 AM

## 2019-04-08 ENCOUNTER — Encounter (HOSPITAL_COMMUNITY): Payer: Self-pay | Admitting: Student

## 2019-04-08 NOTE — Progress Notes (Addendum)
LOS Day #8 Susan [name] G4 [id number] 36w4dAdmitted for PPROM on 04/02/19 @ 0530 w/ PTL Pregnancy complicated by: Incompetent cervix w/ cerclage placement @ 19 weeks, ETOH abuse, CHTN, and BV was dx on admission  S: Doing well, states that she is staying positive w/ each day. Asks several questions/   O: Blood pressure 111/79, pulse 96, temperature 97.9 F (36.6 C), temperature source Oral, resp. rate 18, height 5' 2.5" (1.588 m), weight 73.5 kg, last menstrual period 04/14/2018, SpO2 100 %  Labs: GC and chlamydia will result later today per lab tech  Phys Exam: AAO x3 GU/GI: Abd gravid, soft, non-tender, active FM Extremities: No edema, negative for pain, tenderness, or cords NST- baseline 150, moderate variability, 10x10 accel present, variable decel x1 TOCO- no ctx per TOCO VE: Deferred, last exam 04/02/19: Dilation: 1.5 Effacement (%): 80 Cervical Position: Anterior Station: -1 Presentation: Vertex Exam by:: Pinn  A/P: 38y.o.G4 P0030 316w3dPROM     -stable, AFI 3.36     -afebrile     -continue oral antibiotics  Fetal well-being     -Reactive NST     -NST Q shift  GBS positive     -Treat w/ PCN in active labor CHTN     -normotensive, Latebetolol on hold  BV     -Continue Flagyl course  Plan on induction at 34 weeks, repeat Magnesium for neuro protection if spont labor occurs   Plan discussed w/ Dr. RiLivingston DionesMSN, CNM 04/08/2019 7:44 AM   Patient seen Reports mild sporadic cramping, leaking of clear fluid and good FM. Denies bleeding or pain Plan of care reviewed and questions answered

## 2019-04-09 LAB — GC/CHLAMYDIA PROBE AMP (~~LOC~~) NOT AT ARMC
Chlamydia: NEGATIVE
Comment: NEGATIVE
Comment: NORMAL
Neisseria Gonorrhea: NEGATIVE

## 2019-04-09 LAB — TYPE AND SCREEN
ABO/RH(D): A POS
Antibody Screen: NEGATIVE

## 2019-04-09 NOTE — Progress Notes (Signed)
Initial Nutrition Assessment  DOCUMENTATION CODES:  Not applicable  INTERVENTION:  Regular Diet Snacks TID and double protein portions upon pt request  NUTRITION DIAGNOSIS:   Increased nutrient needs related to (pregnancy and fetal growth requirements) as evidenced by (30 weeks IUP).  GOAL:  Patient will meet greater than or equal to 90% of their needs  MONITOR:  Weight trends  REASON FOR ASSESSMENT:  Antenatal    ASSESSMENT:  30 5/7 weeks, adm w/ PROM, cerclage at 19 weeks. CHTN. wt Aug/2020 141 lbs, BMI 25.6.  20 lb weight gain   Diet Order:   Diet Order            Diet regular Room service appropriate? Yes; Fluid consistency: Thin  Diet effective now              EDUCATION NEEDS:   No education needs have been identified at this time  Skin:  Skin Assessment: Reviewed RN Assessment   Height:   Ht Readings from Last 1 Encounters:  04/02/19 5' 2.5" (1.588 m)    Weight:   Wt Readings from Last 1 Encounters:  04/02/19 73.5 kg    Ideal Body Weight:   112 lbs  BMI:  Body mass index is 29.16 kg/m.  Estimated Nutritional Needs:   Kcal:  1700-1900  Protein:  75-85 g  Fluid:  2 L    Weyman Rodney M.Fredderick Severance LDN Neonatal Nutrition Support Specialist/RD III Pager [phone]      Phone [phone]

## 2019-04-09 NOTE — Progress Notes (Signed)
Patient ID: Susan [name], female   DOB: 06/01/1981, 38 y.o.   MRN: [id number]   LOS Day #9    G4 P0030 G4P0030  PROM 04/02/19 @ 0530 w/ PTL Hx Incompetent cervix w/ cerclage placement @ 19 weeks, ETOH abuse, CHTN, BV  S: Resting comfortably in bed, reports feeling more ctx last evening and during night, but able to sleep through them. (+) LOF clear, no VB. Reports good FM.   O: BP 111/77 (BP Location: Left Arm)   Pulse 97   Temp 97.6 F (36.4 C) (Oral)   Resp 18   Ht 5' 2.5" (1.588 m)   Wt 73.5 kg   LMP 04/14/2018   SpO2 98%   Breastfeeding Unknown   BMI 29.16 kg/m    Phys. Exam: Gen: AAO x 3, NAD Abd: gravid, NT Ext: no edema, no cords or tenderness  EFM last shift (no NST this AM yet)  QHK:UVJDYNXG: 135 bpm, moderate variability, + accels 15x15, no decels Toco: no ctx  VE: Deferred, last exam 04/02/19: Dilation: 1.5 Effacement (%): 80 Cervical Position: Anterior Station: -1 Presentation: Vertex Exam by:: Pinn  A/P- 38 y.o. admitted with preterm labor  and PROM, S/P cerclage at 19 wks, removed on admit 10/23 Dating:  [redacted]w[redacted]d Preterm labor management: ctx resolved after initial Mag Sulfate neuroprophylaxis and cerclage removal, now with occasional ctx, no s/sx of active labor.   Will resume Mag Sulfate for fetal neuroprotection with signs of active labor BMZ given x 2 courses at 24 and 28 weeks  PPROM - stable, last AFI 3.36, afebrile  - Oral ABX erythromycin 250 mg PO TID and Amoxicillin 250 mg PO TID x 5 days completed yesterday  - continue expectant management to 34 wks then IOL  FWB:  Reactive NST  - NST q shift  GBS positive -Treat w/ PCN in active labor  CHTN -normotensive, Latebetolol on hold  BV -Continue Flagyl course x 7 days, last day today  ROD: induced vaginal   Dr. RMancel Baleto follow this AM   DJuliene Pina CNM, MSN 04/09/2019, 7:17 AM

## 2019-04-10 DIAGNOSIS — N760 Acute vaginitis: Secondary | ICD-10-CM | POA: Diagnosis present

## 2019-04-10 DIAGNOSIS — O4100X0 Oligohydramnios, unspecified trimester, not applicable or unspecified: Secondary | ICD-10-CM | POA: Diagnosis not present

## 2019-04-10 DIAGNOSIS — B9689 Other specified bacterial agents as the cause of diseases classified elsewhere: Secondary | ICD-10-CM | POA: Diagnosis present

## 2019-04-10 NOTE — Progress Notes (Addendum)
Susan [name] 38 y.o. G5P0030 @[redacted]w[redacted]d , LOS# 8 for PPROM 10/23 @ 0530. Pt subsequent dx are BV, h/o alcohol abuse, incompetency cervix with PTL, and CHTN.   Subjective:   Pt was sitting up in bed, appears in NAD, pt endorses feeling small cxt from time to time, with increase vaginal clear fluids released, but pt endorses +FM, pt stated had scant amount of light colored blood, but was mixed with vaginal discharge and not much at all, maybe pea sized. Denies s/sx of infections, no vaginal bleeding. Denies vaginal itching, or smells. Denies neuro s/sx.  Objective:    VS: BP 109/69   Pulse 97   Temp 98.3 F (36.8 C) (Oral)   Resp 16   Ht 5' 2.5" (1.588 m)   Wt 73.5 kg   LMP 04/14/2018   SpO2 99%   Breastfeeding Unknown   BMI 29.16 kg/m   Physical Exam: AAO x3, no signs of distress Cardiovascular: RRR Respiratory: Lung fields clear with ausculation GU/GI: Abdomen gravid, non-tender, non-distended, active FM Extremities: no edema, negative for pain, tenderness, and cords Spec exam: Deferred to avoid infection   FHR: baseline 140 / variability moderate / accelerations present / absent decelerations/Reactive, Cat 1  TOCO: no uterine activity traced, but uterine irritability noted. Cxt not palpated.   Last check was on 10/23 @ 1200 Dilation: 1.5 Effacement (%): 80 Cervical Position: Anterior Station: -1 Presentation: Vertex Exam by:: Pinn  Assessment/Plan:  Susan [name] 38 y.o. [id number] @[redacted]w[redacted]d , LOS# 8 for PPROM 10/23 @ 0530. Pt subsequent dx are BV, h/o alcohol abuse, incompetency cervix with PTL, and CHTN.  Incompetence cervix/PTL: pt stable now, over night had several cxt, but no bleeding, no cxt noted on toco,, mild pea sized light pink tinge vaginal discharge noted on pad this morning.  Pt received 12 hours of mag for neuroprotectent, and cerclage was removed onm 10/23 due to fear of PTL, fFn + on 10/23, no cervical checks were performed today. If <34 weeks plan to mag  again if labor incurs.  Plan to use one time dose of procardia if cxt felt if no infection suspected to stop cxt.   PPROM: Pt stable now, fern neg on 10/23, positive amnisure, pt endorses feeling fluid leak with cxt and leakage comes and goes, tolerates well, AFI showed 16.5 on the 10/23, and 3.3 on 10/26, pt on latency abx, just finished on 10/30. Plan to keep pt until 34 weeks and IOL per MFM, but if AFI continue to fall WNL, pt is afebrile and NST are reactive, there may be a chance the pt could go home.   Oligohydramnios: AFI 3.36 on 10/26, continues to leak, remains stable.  CHTN: BP today 106/69, suspected CHTN was noted around 19 weeks due to alcohol abuse, since admission BP have been normotensive to hypotensive, no s/sx, stable, ,not on meds, labetalol 134m BID was discontinued on 10/25   H/O ETOH abuse: denies recent drinking, no S/S of withdrawals, stable.   BV: Tx with flagyl, completed x7 days on 10/31. GC/C Negative.  GBS+: positive in urine in October, TOC 10/24 negative, plan to prophylactic tx during labor.   FWB: NST reactive, switch to QSHift NST, BMZ @ 24&28 weeks, [place] 10[place] vertex, afi 16.3, growth scan on 120/10cephalic, AFI 3.3, 30.7HQR 15%.     Dr CLandry Mellowaware of POC and verbalized agreement.   JKanis Endoscopy CenterCNM, MSN 04/10/2019 10:54 AM   Patient seen and examined agree with above plan of care

## 2019-04-10 NOTE — Plan of Care (Signed)
Patient is aware of her plan of care.Leaking of amniotic fluid has been about the same. She states some pads are wet from front to back and other times very little.

## 2019-04-11 MED ORDER — LACTATED RINGERS IV BOLUS
500.0000 mL | Freq: Once | INTRAVENOUS | Status: AC
  Administered 2019-04-11: 02:00:00 500 mL via INTRAVENOUS

## 2019-04-11 MED ORDER — PROMETHAZINE HCL 25 MG/ML IJ SOLN
25.0000 mg | Freq: Four times a day (QID) | INTRAMUSCULAR | Status: DC | PRN
  Administered 2019-04-12: 25 mg via INTRAVENOUS
  Filled 2019-04-11: qty 1

## 2019-04-11 MED ORDER — FAMOTIDINE 20 MG PO TABS
20.0000 mg | ORAL_TABLET | Freq: Two times a day (BID) | ORAL | Status: DC
  Administered 2019-04-11 (×2): 20 mg via ORAL
  Filled 2019-04-11 (×2): qty 1

## 2019-04-11 MED ORDER — ONDANSETRON HCL 4 MG PO TABS
4.0000 mg | ORAL_TABLET | Freq: Once | ORAL | Status: AC
  Administered 2019-04-11: 23:00:00 4 mg via ORAL
  Filled 2019-04-11: qty 1

## 2019-04-11 MED ORDER — MENTHOL 3 MG MT LOZG
1.0000 | LOZENGE | OROMUCOSAL | Status: DC | PRN
  Filled 2019-04-11: qty 9

## 2019-04-11 NOTE — Progress Notes (Signed)
Susan [name] 38 y.o. G5P0030 @[redacted]w[redacted]d , LOS# 9 for PPROM 10/23 @ 0530. Pt subsequent dx are BV, h/o alcohol abuse, incompetency cervix with PTL, and CHTN.   Subjective:   Pt was sitting up in bed, and I was called by RN reporting cxt noted by pt, fluid bolus 500mg  given, with position change. Pt stated cxt feel stronger pain 7/10, but only for a short while then returned to normal cxt noted compared to light menstrual cramps. I palpate mild to no cxt. No vaginal bleeding noted, but increased cervical mucous with slight pink tinged was noted. Appears in NAD, +FM. Denies s/sx of infections, no vaginal bleeding. Denies vaginal itching, or smells. Denies neuro s/sx.  Objective:    VS: BP 111/77   Pulse 89   Temp 97.9 F (36.6 C) (Oral)   Resp 17   Ht 5' 2.5" (1.588 m)   Wt 73.5 kg   LMP 04/14/2018   SpO2 98%   Breastfeeding Unknown   BMI 29.16 kg/m   Physical Exam: AAO x3, no signs of distress Cardiovascular: RRR Respiratory: Lung fields clear with ausculation GU/GI: Abdomen gravid, non-tender, non-distended, active FM Extremities: no edema, negative for pain, tenderness, and cords Spec exam: Deferred to avoid infection   FHR: baseline 135 / variability moderate / accelerations present / absent decelerations/Reactive, Cat 1  TOCO: cxt noted on monitor, appeared to look like muscle cxt instead, but at times noted cxt on monitor, uterine irritability noted. Cxt not palpated.   Last check was on 10/23 @ 1200 Dilation: 1.5 Effacement (%): 80 Cervical Position: Anterior Station: -1 Presentation: Vertex Exam by:: Pinn  Assessment/Plan:  Susan [name] 38 y.o. [id number] @[redacted]w[redacted]d , LOS# 9 for PPROM 10/23 @ 0530. Pt subsequent dx are BV, h/o alcohol abuse, incompetency cervix with PTL, and CHTN.  Incompetence cervix/PTL: pt stable now, over night had several cxt, but no bleeding, mild pea sized light pink tinge vaginal discharge noted on pad this morning.  Pt received 12 hours of mag  for neuroprotectent, and cerclage was removed onm 10/23 due to fear of PTL, fFn + on 10/23, no cervical checks were performed today. If <34 weeks plan to mag again if labor incurs.  Plan to use one time dose of procardia if cxt felt if no infection suspected to stop cxt.   PPROM: Pt stable now, fern neg on 10/23, positive amnisure, pt endorses feeling fluid leak with cxt and leakage comes and goes, tolerates well, AFI showed 16.5 on the 10/23, and 3.3 on 10/26, pt on latency abx, just finished on 10/30. Plan to keep pt until 34 weeks and IOL per MFM, but if AFI continue to fall WNL, pt is afebrile and NST are reactive, there may be a chance the pt could go home.   Oligohydramnios: AFI 3.36 on 10/26, continues to leak, remains stable.  CHTN: BP today 1111/77, suspected CHTN was noted around 19 weeks due to alcohol abuse, since admission BP have been normotensive to hypotensive, no s/sx, stable, ,not on meds, labetalol 100mg  BID was discontinued on 10/25   H/O ETOH abuse: denies recent drinking, no S/S of withdrawals, stable.   BV: Tx with flagyl, completed x7 days on 10/31. GC/C Negative.  GBS+: positive in urine in October, TOC 10/24 negative, plan to prophylactic tx during labor.   FWB: NST reactive, switch to QSHift NST, BMZ @ 24&28 weeks, [place] on 10/23 vertex, afi 16.3, growth scan on 95/63 cephalic, AFI 3.3, 8.7FIE, 15%.     Dr  Cole aware of POC and verbalized agreement.   [facility] CNM, MSN 04/11/2019 3:53 AM   Patient seen and examined agree with above plan of care

## 2019-04-11 NOTE — Plan of Care (Signed)
  Problem: Clinical Measurements: Goal: Complications related to the disease process, condition or treatment will be avoided or minimized Outcome: Progressing  Pt. Encouraged to keep up with PO hydration and to report any changes in condition to RN immediately for monitoring. Pt. In agreement. No report of pain or ctx this AM, reports good fetal movement.

## 2019-04-12 ENCOUNTER — Inpatient Hospital Stay (HOSPITAL_COMMUNITY): Payer: Medicaid Other | Admitting: Anesthesiology

## 2019-04-12 LAB — CBC
HCT: 30.2 % — ABNORMAL LOW (ref 36.0–46.0)
Hemoglobin: 9.5 g/dL — ABNORMAL LOW (ref 12.0–15.0)
MCH: 28.8 pg (ref 26.0–34.0)
MCHC: 31.5 g/dL (ref 30.0–36.0)
MCV: 91.5 fL (ref 80.0–100.0)
Platelets: 353 10*3/uL (ref 150–400)
RBC: 3.3 MIL/uL — ABNORMAL LOW (ref 3.87–5.11)
RDW: 15.5 % (ref 11.5–15.5)
WBC: 17.3 10*3/uL — ABNORMAL HIGH (ref 4.0–10.5)
nRBC: 0 % (ref 0.0–0.2)

## 2019-04-12 LAB — TYPE AND SCREEN
ABO/RH(D): A POS
Antibody Screen: NEGATIVE

## 2019-04-12 MED ORDER — PHENYLEPHRINE 40 MCG/ML (10ML) SYRINGE FOR IV PUSH (FOR BLOOD PRESSURE SUPPORT)
80.0000 ug | PREFILLED_SYRINGE | INTRAVENOUS | Status: DC | PRN
  Filled 2019-04-12: qty 10

## 2019-04-12 MED ORDER — MENTHOL 3 MG MT LOZG
1.0000 | LOZENGE | OROMUCOSAL | Status: DC | PRN
  Administered 2019-04-12: 23:00:00 3 mg via ORAL
  Filled 2019-04-12: qty 9

## 2019-04-12 MED ORDER — PENICILLIN G 3 MILLION UNITS IVPB - SIMPLE MED
3.0000 10*6.[IU] | INTRAVENOUS | Status: DC
  Administered 2019-04-12 – 2019-04-13 (×4): 3 10*6.[IU] via INTRAVENOUS
  Filled 2019-04-12 (×7): qty 100

## 2019-04-12 MED ORDER — MAGNESIUM SULFATE 40 GM/1000ML IV SOLN
INTRAVENOUS | Status: AC
  Administered 2019-04-12: 02:00:00
  Filled 2019-04-12: qty 1000

## 2019-04-12 MED ORDER — LIDOCAINE-EPINEPHRINE (PF) 2 %-1:200000 IJ SOLN
INTRAMUSCULAR | Status: DC | PRN
  Administered 2019-04-12: 2 mL via EPIDURAL
  Administered 2019-04-12: 1 mL via EPIDURAL

## 2019-04-12 MED ORDER — LACTATED RINGERS IV SOLN
INTRAVENOUS | Status: DC
  Administered 2019-04-12 (×3): via INTRAVENOUS

## 2019-04-12 MED ORDER — SODIUM CHLORIDE (PF) 0.9 % IJ SOLN
INTRAMUSCULAR | Status: DC | PRN
  Administered 2019-04-12: 12 mL/h via EPIDURAL

## 2019-04-12 MED ORDER — PENICILLIN G 3 MILLION UNITS IVPB - SIMPLE MED
3.0000 10*6.[IU] | Freq: Once | INTRAVENOUS | Status: AC
  Administered 2019-04-12: 06:00:00 3 10*6.[IU] via INTRAVENOUS
  Filled 2019-04-12 (×2): qty 100

## 2019-04-12 MED ORDER — FENTANYL-BUPIVACAINE-NACL 0.5-0.125-0.9 MG/250ML-% EP SOLN
12.0000 mL/h | EPIDURAL | Status: DC | PRN
  Administered 2019-04-12: 12 mL/h via EPIDURAL
  Filled 2019-04-12 (×2): qty 250

## 2019-04-12 MED ORDER — DIPHENHYDRAMINE HCL 50 MG/ML IJ SOLN: 12.5000 mg | INTRAMUSCULAR | Status: DC | PRN

## 2019-04-12 MED ORDER — MAGNESIUM SULFATE 40 GM/1000ML IV SOLN
2.0000 g/h | INTRAVENOUS | Status: DC
  Administered 2019-04-12: 02:00:00 2 g/h via INTRAVENOUS

## 2019-04-12 MED ORDER — EPHEDRINE 5 MG/ML INJ: 10.0000 mg | INTRAVENOUS | Status: DC | PRN

## 2019-04-12 MED ORDER — FENTANYL CITRATE (PF) 100 MCG/2ML IJ SOLN
25.0000 ug | Freq: Once | INTRAMUSCULAR | Status: AC
  Administered 2019-04-12: 25 ug via INTRAVENOUS
  Filled 2019-04-12: qty 2

## 2019-04-12 MED ORDER — SODIUM CHLORIDE 0.9 % IV SOLN
5.0000 10*6.[IU] | Freq: Once | INTRAVENOUS | Status: AC
  Administered 2019-04-12: 02:00:00 5 10*6.[IU] via INTRAVENOUS
  Filled 2019-04-12: qty 5

## 2019-04-12 MED ORDER — MAGNESIUM SULFATE BOLUS VIA INFUSION
4.0000 g | Freq: Once | INTRAVENOUS | Status: DC
  Filled 2019-04-12: qty 1000

## 2019-04-12 MED ORDER — PHENYLEPHRINE 40 MCG/ML (10ML) SYRINGE FOR IV PUSH (FOR BLOOD PRESSURE SUPPORT)
80.0000 ug | PREFILLED_SYRINGE | INTRAVENOUS | Status: AC | PRN
  Administered 2019-04-12 (×3): 80 ug via INTRAVENOUS

## 2019-04-12 MED ORDER — LACTATED RINGERS IV SOLN
500.0000 mL | Freq: Once | INTRAVENOUS | Status: AC
  Administered 2019-04-12: 500 mL via INTRAVENOUS

## 2019-04-12 NOTE — Progress Notes (Signed)
Patient ID: Susan [name], female   DOB: 08/25/1980, 37 y.o.   MRN: [id number]  Pt with occ pressure BP 107/68   Pulse (!) 104   Temp 97.8 F (36.6 C) (Axillary)   Resp 14   Ht 5' 2.5" (1.588 m)   Wt 73.5 kg   LMP 04/14/2018   SpO2 99%   Breastfeeding Unknown   BMI 29.16 kg/m  Cat 1 occ ctx Continue magnesium until 2pm Continue PCN NICU to see pt  Pt stabel will monitor closely

## 2019-04-12 NOTE — Progress Notes (Signed)
Subjective: Patient comfortable after epidural.  No further nausea.  Not feeling contractions.  Objective: BP 95/67   Pulse 93   Temp 97.6 F (36.4 C) (Oral)   Resp 20   Ht 5' 2.5" (1.588 m)   Wt 73.5 kg   LMP 04/14/2018   SpO2 100%   Breastfeeding Unknown   BMI 29.16 kg/m  I/O last 3 completed shifts: In: 2357.3 [P.O.:270; I.V.:1237.3; IV Piggyback:850] Out: 5056 [Urine:1450; Emesis/NG output:75] No intake/output data recorded.  Results for orders placed or performed during the hospital encounter of 04/02/19 (from the past 24 hour(s))  CBC     Status: Abnormal   Collection Time: 04/12/19  3:05 AM  Result Value Ref Range   WBC 17.3 (H) 4.0 - 10.5 K/uL   RBC 3.30 (L) 3.87 - 5.11 MIL/uL   Hemoglobin 9.5 (L) 12.0 - 15.0 g/dL   HCT 30.2 (L) 36.0 - 46.0 %   MCV 91.5 80.0 - 100.0 fL   MCH 28.8 26.0 - 34.0 pg   MCHC 31.5 30.0 - 36.0 g/dL   RDW 15.5 11.5 - 15.5 %   Platelets 353 150 - 400 K/uL   nRBC 0.0 0.0 - 0.2 %     FHT: Category 1 UC:   irregular, every 3-5 minutes SVE:   Dilation: 5 Effacement (%): 80 Station: (Not stated by CNM) Exam by:: N. Demonie Kassa   Assessment:  31 wk IUP with PPROM in labor Cat 1 strip GBS positive Plan: Magnesium Sulfate for neuroprotection Epidural for pain  PCN  Anticipate SVD NICU aware  Starla Link CNM, MSN 04/12/2019, 7:15 AM

## 2019-04-12 NOTE — Anesthesia Procedure Notes (Signed)
Epidural Patient location during procedure: OB Start time: 04/12/2019 4:10 AM End time: 04/12/2019 4:25 AM  Staffing Anesthesiologist: Freddrick March, MD Performed: anesthesiologist   Preanesthetic Checklist Completed: patient identified, pre-op evaluation, timeout performed, IV checked, risks and benefits discussed and monitors and equipment checked  Epidural Patient position: sitting Prep: site prepped and draped and DuraPrep Patient monitoring: continuous pulse ox, blood pressure, heart rate and cardiac monitor Approach: midline Location: L3-L4 Injection technique: LOR air  Needle:  Needle type: Tuohy  Needle gauge: 17 G Needle length: 9 cm Needle insertion depth: 5 cm Catheter type: closed end flexible Catheter size: 19 Gauge Catheter at skin depth: 11 cm Test dose: negative  Assessment Sensory level: T8 Events: blood not aspirated, injection not painful, no injection resistance, negative IV test and no paresthesia  Additional Notes Patient identified. Risks/Benefits/Options discussed with patient including but not limited to bleeding, infection, nerve damage, paralysis, failed block, incomplete pain control, headache, blood pressure changes, nausea, vomiting, reactions to medication both or allergic, itching and postpartum back pain. Confirmed with bedside nurse the patient's most recent platelet count. Confirmed with patient that they are not currently taking any anticoagulation, have any bleeding history or any family history of bleeding disorders. Patient expressed understanding and wished to proceed. All questions were answered. Sterile technique was used throughout the entire procedure. Please see nursing notes for vital signs. Test dose was given through epidural catheter and negative prior to continuing to dose epidural or start infusion. Warning signs of high block given to the patient including shortness of breath, tingling/numbness in hands, complete motor block,  or any concerning symptoms with instructions to call for help. Patient was given instructions on fall risk and not to get out of bed. All questions and concerns addressed with instructions to call with any issues or inadequate analgesia.  Reason for block:procedure for pain

## 2019-04-12 NOTE — Progress Notes (Signed)
S: RN called stated patient having back pain and some nausea.  Has had some bloody show to day. O:  Vitals:   04/11/19 1941 04/11/19 2311  BP: 119/80 125/89  Pulse: 88 94  Resp: 18 19  Temp: 98.1 F (36.7 C) 98.1 F (36.7 C)  SpO2: 100% 100%   FHT BL 145 variability present with accels no decels Contractions not picking up on monitor, not palpated by RN A: Back pain with PPROM at 31 weeks Cat 1 strip P:  Comfort measures of phenergan, IV bolus and Fentanyl if continues to have pain will assess cervix

## 2019-04-12 NOTE — Anesthesia Preprocedure Evaluation (Addendum)
Anesthesia Evaluation  Patient identified by MRN, date of birth, ID band Patient awake    Reviewed: Allergy & Precautions, NPO status , Patient's Chart, lab work & pertinent test results  Airway Mallampati: II  TM Distance: >3 FB Neck ROM: Full    Dental no notable dental hx.    Pulmonary neg pulmonary ROS, former smoker,    Pulmonary exam normal breath sounds clear to auscultation       Cardiovascular hypertension (PIH on Mag), Normal cardiovascular exam Rhythm:Regular Rate:Normal     Neuro/Psych PSYCHIATRIC DISORDERS Depression negative neurological ROS     GI/Hepatic negative GI ROS, (+)     substance abuse  alcohol use,   Endo/Other  negative endocrine ROS  Renal/GU negative Renal ROS  negative genitourinary   Musculoskeletal negative musculoskeletal ROS (+)   Abdominal   Peds  Hematology  (+) Blood dyscrasia (Hgb 9.5), anemia ,   Anesthesia Other Findings 31/[redacted] weeks gestation, PPROM  Reproductive/Obstetrics (+) Pregnancy                           Anesthesia Physical Anesthesia Plan  ASA: III  Anesthesia Plan: Epidural   Post-op Pain Management:    Induction:   PONV Risk Score and Plan: Treatment may vary due to age or medical condition  Airway Management Planned: Natural Airway  Additional Equipment:   Intra-op Plan:   Post-operative Plan:   Informed Consent: I have reviewed the patients History and Physical, chart, labs and discussed the procedure including the risks, benefits and alternatives for the proposed anesthesia with the patient or authorized representative who has indicated his/her understanding and acceptance.       Plan Discussed with: Anesthesiologist  Anesthesia Plan Comments: (Patient identified. Risks, benefits, options discussed with patient including but not limited to bleeding, infection, nerve damage, paralysis, failed block, incomplete pain  control, headache, blood pressure changes, nausea, vomiting, reactions to medication, itching, and post partum back pain. Confirmed with bedside nurse the patient's most recent platelet count. Confirmed with the patient that they are not taking any anticoagulation, have any bleeding history or any family history of bleeding disorders. Patient expressed understanding and wishes to proceed. All questions were answered. )        Anesthesia Quick Evaluation

## 2019-04-12 NOTE — Progress Notes (Signed)
Patient ID: Susan [name], female   DOB: [birth year], 38 y.o.   MRN: [id number]  Pt without c/o she is comfortable BP 109/76   Pulse 99   Temp 98.7 F (37.1 C) (Oral)   Resp 16   Ht 5' 2.5" (1.588 m)   Wt 73.5 kg   LMP 04/14/2018   SpO2 99%   Breastfeeding Unknown   BMI 29.16 kg/m  Cat 1 toco q 5-7 min 5/90/-1 PPROM Labor S/p cerclage removal PCN  Anticipate SVD

## 2019-04-12 NOTE — Progress Notes (Addendum)
Subjective: Patient is still feeling back pain. Reports that it is worse than before.  Objective: BP 125/89 (BP Location: Right Arm)   Pulse 94   Temp 98.1 F (36.7 C) (Oral)   Resp 19   Ht 5' 2.5" (1.588 m)   Wt 73.5 kg   LMP 04/14/2018   SpO2 100%   Breastfeeding Unknown   BMI 29.16 kg/m  I/O last 3 completed shifts: In: 503 [I.V.:3; IV Piggyback:500] Out: -  Total I/O In: 3 [I.V.:3] Out: 75 [Emesis/NG output:75]  FHT: Category 1 FHT BL 120 accels no decels UC:   regular, every 4-5 minutes SVE: 5/100/0   Assessment:  G4P0030 at 31.1 IUP with PPROM in active labor Cat 1 strip GBS positive  Plan: Magnesium sulfate for neuroprotection PCN for GBS Dr. Landry Mellow aware of patient status  Starla Link CNM, MSN 04/12/2019, 2:18 AM

## 2019-04-13 ENCOUNTER — Encounter: Payer: Self-pay | Admitting: Student

## 2019-04-13 LAB — CBC
HCT: 29.9 % — ABNORMAL LOW (ref 36.0–46.0)
Hemoglobin: 9.2 g/dL — ABNORMAL LOW (ref 12.0–15.0)
MCH: 28.7 pg (ref 26.0–34.0)
MCHC: 30.8 g/dL (ref 30.0–36.0)
MCV: 93.1 fL (ref 80.0–100.0)
Platelets: 348 10*3/uL (ref 150–400)
RBC: 3.21 MIL/uL — ABNORMAL LOW (ref 3.87–5.11)
RDW: 15.6 % — ABNORMAL HIGH (ref 11.5–15.5)
WBC: 16.5 10*3/uL — ABNORMAL HIGH (ref 4.0–10.5)
nRBC: 0.1 % (ref 0.0–0.2)

## 2019-04-13 MED ORDER — COCONUT OIL OIL: 1.0000 "application " | TOPICAL_OIL | Status: DC | PRN

## 2019-04-13 MED ORDER — OXYTOCIN 40 UNITS IN NORMAL SALINE INFUSION - SIMPLE MED
INTRAVENOUS | Status: AC
  Filled 2019-04-13: qty 1000

## 2019-04-13 MED ORDER — TETANUS-DIPHTH-ACELL PERTUSSIS 5-2.5-18.5 LF-MCG/0.5 IM SUSP: 0.5000 mL | Freq: Once | INTRAMUSCULAR | Status: DC

## 2019-04-13 MED ORDER — FAMOTIDINE IN NACL 20-0.9 MG/50ML-% IV SOLN
20.0000 mg | Freq: Once | INTRAVENOUS | Status: AC
  Administered 2019-04-13: 08:00:00 20 mg via INTRAVENOUS
  Filled 2019-04-13: qty 50

## 2019-04-13 MED ORDER — DIPHENHYDRAMINE HCL 25 MG PO CAPS: 25.0000 mg | ORAL_CAPSULE | Freq: Four times a day (QID) | ORAL | Status: DC | PRN

## 2019-04-13 MED ORDER — BENZOCAINE-MENTHOL 20-0.5 % EX AERO: 1.0000 "application " | INHALATION_SPRAY | CUTANEOUS | Status: DC | PRN

## 2019-04-13 MED ORDER — ONDANSETRON HCL 4 MG/2ML IJ SOLN: 4.0000 mg | INTRAMUSCULAR | Status: DC | PRN

## 2019-04-13 MED ORDER — SENNOSIDES-DOCUSATE SODIUM 8.6-50 MG PO TABS
2.0000 | ORAL_TABLET | ORAL | Status: DC
  Filled 2019-04-13: qty 2

## 2019-04-13 MED ORDER — DIBUCAINE (PERIANAL) 1 % EX OINT: 1.0000 "application " | TOPICAL_OINTMENT | CUTANEOUS | Status: DC | PRN

## 2019-04-13 MED ORDER — FERROUS SULFATE 325 (65 FE) MG PO TABS: 325.0000 mg | ORAL_TABLET | Freq: Two times a day (BID) | ORAL | Status: DC

## 2019-04-13 MED ORDER — ACETAMINOPHEN 325 MG PO TABS: 650.0000 mg | ORAL_TABLET | ORAL | Status: DC | PRN

## 2019-04-13 MED ORDER — OXYTOCIN BOLUS FROM INFUSION
500.0000 mL | Freq: Once | INTRAVENOUS | Status: AC
  Administered 2019-04-13: 02:00:00 500 mL via INTRAVENOUS

## 2019-04-13 MED ORDER — IBUPROFEN 600 MG PO TABS
600.0000 mg | ORAL_TABLET | Freq: Four times a day (QID) | ORAL | Status: DC
  Administered 2019-04-13 – 2019-04-14 (×6): 600 mg via ORAL
  Filled 2019-04-13 (×6): qty 1

## 2019-04-13 MED ORDER — FERROUS SULFATE 325 (65 FE) MG PO TABS
325.0000 mg | ORAL_TABLET | Freq: Two times a day (BID) | ORAL | Status: DC
  Administered 2019-04-13 – 2019-04-14 (×3): 325 mg via ORAL
  Filled 2019-04-13 (×3): qty 1

## 2019-04-13 MED ORDER — WITCH HAZEL-GLYCERIN EX PADS: 1.0000 "application " | MEDICATED_PAD | CUTANEOUS | Status: DC | PRN

## 2019-04-13 MED ORDER — DOCUSATE SODIUM 100 MG PO CAPS
100.0000 mg | ORAL_CAPSULE | Freq: Two times a day (BID) | ORAL | Status: DC
  Administered 2019-04-13 – 2019-04-14 (×2): 100 mg via ORAL
  Filled 2019-04-13 (×2): qty 1

## 2019-04-13 MED ORDER — OXYTOCIN 40 UNITS IN NORMAL SALINE INFUSION - SIMPLE MED: 2.5000 [IU]/h | INTRAVENOUS | Status: DC

## 2019-04-13 MED ORDER — SIMETHICONE 80 MG PO CHEW: 80.0000 mg | CHEWABLE_TABLET | ORAL | Status: DC | PRN

## 2019-04-13 MED ORDER — PRENATAL MULTIVITAMIN CH
1.0000 | ORAL_TABLET | Freq: Every day | ORAL | Status: DC
  Administered 2019-04-13 – 2019-04-14 (×2): 1 via ORAL
  Filled 2019-04-13 (×2): qty 1

## 2019-04-13 MED ORDER — ONDANSETRON HCL 4 MG PO TABS: 4.0000 mg | ORAL_TABLET | ORAL | Status: DC | PRN

## 2019-04-13 NOTE — Lactation Note (Signed)
This note was copied from a baby's chart. Lactation Consultation Note  Patient Name: Susan [name] JSHFW'Y Date: 04/13/2019 Reason for consult: Initial assessment;NICU baby;Preterm <34wks;Primapara Baby is 44 hours old and in the NICU/  Mom and baby are in couplet care.  Mom has pumped three times using symphony pump.  She has not obtained colostrum but will add hand expression.  Reviewed pumping and answered questions.  Mom will use a Spectra pump after discharge.  Discussed continuing to use Symphony when visiting baby.  Breastfeeding consultation services and Providing Breastmilk For Your Baby In NICU book given.  Encouraged to call prn.  Maternal Data Has patient been taught Hand Expression?: Yes Does the patient have breastfeeding experience prior to this delivery?: No  Feeding    LATCH Score                   Interventions Interventions: DEBP  Lactation Tools Discussed/Used     Consult Status Consult Status: Follow-up Date: 04/14/19 Follow-up type: In-patient    Ave Filter 04/13/2019, 11:00 AM

## 2019-04-13 NOTE — Progress Notes (Signed)
Subjective:    Reports vaginal pressure  Objective:    VS: BP 99/62   Pulse 87   Temp 98.3 F (36.8 C) (Oral)   Resp 17   Ht 5' 2.5" (1.588 m)   Wt 73.5 kg   LMP 04/14/2018   SpO2 99%   Breastfeeding Unknown   BMI 29.16 kg/m  FHR : baseline 125 / variability moderate / accelerations present / variable decelerations Toco: contractions-irreg Membranes: SROM 04/02/19 @ 0530 Dilation: 8 Effacement (%): 90 Cervical Position: Anterior Station: Plus 2 Presentation: Vertex Exam by:: Burman Foster, CNM  Assessment/Plan:   38 y.o. G4P0030 [redacted]w[redacted]d Preterm labor: Spontaneous progression CHTN: Fetal Wellbeing:  Category II Pain Control:  Epidural I/D:  GBS positive-adequate treatment Anticipated MOD:  NSVD   Dr. PAlwyn Peacalled to come for delivery  Susan EasternCNM, MSN 04/13/2019 12:29 AM

## 2019-04-13 NOTE — Anesthesia Postprocedure Evaluation (Signed)
Anesthesia Post Note  Patient: Tour manager  Procedure(s) Performed: AN AD HOC LABOR EPIDURAL     Patient location during evaluation: Mother Baby Anesthesia Type: Epidural Level of consciousness: awake and alert and oriented Pain management: satisfactory to patient Vital Signs Assessment: post-procedure vital signs reviewed and stable Respiratory status: spontaneous breathing and nonlabored ventilation Cardiovascular status: stable Postop Assessment: no headache, no backache, no signs of nausea or vomiting, adequate PO intake, patient able to bend at knees and able to ambulate (patient up walking) Anesthetic complications: no    Last Vitals:  Vitals:   04/13/19 0400 04/13/19 0500  BP: (!) 144/79 114/80  Pulse: 93 88  Resp: 18 18  Temp: 36.7 C 36.7 C  SpO2: 100% 99%    Last Pain:  Vitals:   04/13/19 0708  TempSrc:   PainSc: Asleep   Pain Goal: Patients Stated Pain Goal: 0 (04/12/19 1830)                 Willa Rough

## 2019-04-14 MED ORDER — IBUPROFEN 600 MG PO TABS: 600.0000 mg | ORAL_TABLET | Freq: Four times a day (QID) | ORAL | 0 refills | Status: DC

## 2019-04-14 NOTE — Clinical Social Work Maternal (Signed)
CLINICAL SOCIAL WORK MATERNAL/CHILD NOTE  Patient Details  Name: Susan [name] MRN: [id number] Date of Birth: [birth year]  Date:  04/14/2019  Clinical Social Worker Initiating Note:  (P) Abundio Miu, LCSW Date/Time: Initiated:  (P) 04/14/19/(P) 1032     Child's Name:  (P) Susan [name]   Biological Parents:  (P) Mother, Father(Father: Irving Shows)   Need for Interpreter:  (P) None   Reason for Referral:  (P) Parental Support of Premature Babies < 32 weeks/or Critically Ill babies   Address:  [place] [address]    Phone number:  [phone] (home)     Additional phone number:   Household Members/Support Persons (HM/SP):       HM/SP Name Relationship DOB or Age  HM/SP -1        HM/SP -2        HM/SP -3        HM/SP -4        HM/SP -5        HM/SP -6        HM/SP -7        HM/SP -8          Natural Supports (not living in the home):  (P) Parent, Friends, Immediate Family   Professional Supports: (P) None   Employment: (P) Other (comment)(Contract Work)   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:  (P) [place] arranged:    Pensions consultant:  (P) Medicaid   Other Resources:  (P) WIC, Food Stamps    Cultural/Religious Considerations Which May Impact Care:    Strengths:  (P) Ability to meet basic needs , Pediatrician chosen, Home prepared for child , Understanding of illness   Psychotropic Medications:         Pediatrician:    Mamie Nick) [place] area  Pediatrician List:   [place] (P) [facility]      Pediatrician Fax Number:    Risk Factors/Current Problems:  (P) None   Cognitive State:  (P) Able to Concentrate , Alert , Goal Oriented , Insightful , Linear Thinking    Mood/Affect:  (P) Interested , Relaxed , Happy , Calm    CSW Assessment: CSW met with MOB  at bedside to discuss infant's NICU admission. MOB was standing beside isolette and looking at infant. CSW introduced self and explained reason for consult. MOB was welcoming, pleasant and engaged during assessment. MOB started off assessment by informing CSW that she is no longer with FOB and he has concerns about paternity. MOB reported that she was informed that the hospital and her OB GYN office does not do paternity testing. MOB reported that FOB ordered a paternity kit and they plan to do paternity testing. MOB inquired about the validity of the test, CSW encouraged MOB to read the instructions and go by what the kit says. CSW inquired about how MOB was coping with the issue of paternity, MOB acknowledged infant's paternity as a stressor initially then later reported that it is not her concern but she is open to completing testing. CSW positively affirmed MOB's willingness, MOB reported that she is just trying to do what's best for her son.   MOB reported that she resides alone and partly considers herself working. 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 MOB reported that being out of work has been a stressor for her and she plans to move soon. MOB reported that she will be moving close to the hospital and her moving date is 04/25/2019. CSW informed MOB about the [employer] financial assistance program, MOB verbalized plan to follow up. MOB reported that she receives both [facility] and food stamps. MOB verbalized plan to follow up with [place], CSW agreed to provide MOB with contact information. MOB reported that she has received a lot of items for infant and has 2 car seats, crib, diapers and bottles. CSW informed MOB about the family support network and assistance with items if needed. CSW inquired about MOB's support system, MOB reported that her mother, sisters and friends are good supports.   CSW inquired about MOB's  mental health history, MOB initially reported no mental health history. CSW inquired about a history of anxiety and depression, MOB reported that she experienced situational anxiety and depression associated with her divorce 3 years ago. MOB reported that she did therapy during that time which was helpful and she was also placed on medication which had bad side effects and she discontinued use. MOB denied any current anxiety/depressive symptoms. CSW inquired about how MOB was feeling emotionally after giving birth, MOB reported that she felt good and that her son saved her life. CSW asked MOB about history of alcohol abuse and was this a current issue for her, MOB acknowledged the history and reported that this is no longer an issue for her. MOB presented calm and had insight about her mental health history. MOB did not demonstrate any acute mental health signs/symptoms. CSW assessed for safety, MOB denied SI, HI and domestic violence.   CSW provided education regarding the baby blues period vs. perinatal mood disorders, discussed treatment and gave resources for mental health follow up if concerns arise.  CSW recommends self-evaluation during the postpartum time period using the New Mom Checklist from Postpartum Progress and encouraged MOB to contact a medical professional if symptoms are noted at any time.    CSW provided review of Sudden Infant Death Syndrome (SIDS) precautions. MOB verbalized understanding and reported that infant has a crib to sleep in.   CSW and MOB discussed infant's NICU admission. MOB reported that the staff has been great and she feels well informed about infant's care. CSW informed MOB about the NICU, what to expect and resources/supports available while infant is admitted to the NICU. MOB reported that meal vouchers and a gas card would be helpful. CSW provided MOB with 4 meal vouchers and agreed to provide a gas card when they become available. CSW provided MOB with the number to  [employer]. MOB denied any other needs/concerns. CSW encouraged MOB to contact CSW if any needs/concerns arise.   CSW will continue to offer resources/supports while infant is admitted to the NICU.    CSW Plan/Description:  (P) Sudden Infant Death Syndrome (SIDS) Education, Perinatal Mood and Anxiety Disorder (PMADs) Education, Other Patient/Family Education, Other Information/Referral to [employer], LCSW 04/14/2019, 11:06 AM

## 2019-04-14 NOTE — Lactation Note (Signed)
This note was copied from a baby's chart. Lactation Consultation Note  Patient Name: Susan [name] ZYSAY'T Date: 04/14/2019 Reason for consult: Preterm <34wks;1st time breastfeeding;NICU baby;Infant < 6lbs  P1 mother whose infant is now 51 hours old.  This is a preterm baby weighing <6 lbs and in the NICU.  Mother has been in couplet care with her baby and is being discharged home today.  Mother was pumping with the DEBP when I arrived, using her "hands free" bra.  Explained how she can do breast compressions during pumping to help increase her supply.  She is pumping every three hours and is now starting to obtain some EBM.  Mother has been rubbing her colostrum drops on baby's lips and in his mouth.  She is appreciative of having the couplet care room available.  Mother has a DEBP for home use and will use the Medela pump when she visits in the NICU.  Discussed milk transport and storage.  Suggested she ask her RN for breast milk labels.  Informed mother that we will be able to provide lactation services once baby is ready to latch.  She is looking forward to that day.  Also discussed taking breaks, time for herself, proper nourishment and sleep time.  Mother verbalized understanding and will call for any further questions/concerns as she visits her baby.   Maternal Data    Feeding Feeding Type: Donor Breast Milk  LATCH Score                   Interventions Interventions: Skin to skin  Lactation Tools Discussed/Used     Consult Status Consult Status: PRN Date: 04/14/19 Follow-up type: Call as needed    Olaoluwa Grieder R Ansel Ferrall 04/14/2019, 11:37 AM

## 2019-04-14 NOTE — Discharge Summary (Signed)
OB Discharge Summary     Patient Name: Susan [name] DOB: [birth year] MRN: [id number]  Date of admission: 04/02/2019 Delivering MD: Sanjuana Kava   Date of discharge: 04/14/2019  Admitting diagnosis: 29 WKS, LEAKING FLUIDS Intrauterine pregnancy: [redacted]w[redacted]d    Secondary diagnosis:  Active Problems:   SVD (11/3)   AMA (advanced maternal age) multigravida 35+   Positive GBS    Chronic hypertension   PPROM  Additional problems:  Patient Active Problem List   Diagnosis Date Noted  . SVD (11/3) 04/13/2019    Priority: Medium  . AMA (advanced maternal age) multigravida 35+ 04/02/2019  . Positive GBS  04/02/2019  . Chronic hypertension 04/02/2019  . PPROM 04/02/2019  . Alcohol use 01/17/2019  . Premature cervical dilation in second trimester 01/16/2019  . Alcohol-induced pancreatitis 03/25/2018  . Foreign body alimentary tract, subsequent encounter 03/25/2018  . AKI ([place] kidney injury) ([place] 03/25/2018  . Alcoholic hepatitis without ascites 03/25/2018  . Alcoholic hepatitis 170/35/0093 . Acute pancreatitis 03/22/2018  . Gastritis 03/03/2018  . Depression 03/03/2018  . Iron deficiency anemia 03/03/2018  . Alcohol withdrawal ([place] 03/31/2017  . GI bleed 09/05/2016        Discharge diagnosis: Preterm Pregnancy Delivered and CHTN                                                                                                Post partum procedures:None  Augmentation: None  Complications: None  Hospital course:  Onset of Labor With Vaginal Delivery     38y.o. yo G(938) [id number] 366w2das admitted in Latent Labor on 04/02/2019. Admitted for PPROM on 04/02/19 @ 0530 and PTL. 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 Pt completed antenatal steroids @ 24 and 28 weeks. Cerclage removed on admission. Pt received Magnesium sulfate for neuro protection x12 hours upon admission, labor stalled after MgSO4.  Remained in-patient x10 days and treated w/ latency antibiotics. MFM consult recommeded delivery @ 34 weeks, not to repeat steroids, and to repeat MgSO4 for neuro protection if pt started to labor again. Ctx started on 11/2, tx from antepartum to L&D, MgSO4 started. Adequate treatment of GBS w. PCN. Patient had an uncomplicated labor course as follows:  Membrane Rupture Time/Date: 5:06 AM ,04/02/2019   Intrapartum Procedures: Episiotomy: None [1]                                         Lacerations:  None [1]  Patient had a delivery of a Viable infant. 04/13/2019  Information for the patient's newborn:  OsMeloni, Hinz0[[id number]]Delivery Method: Vaginal, Spontaneous(Filed from Delivery Summary)     Pateint had an uncomplicated postpartum course.  She is ambulating, tolerating a regular diet, passing flatus, and urinating well. Patient is discharged home in stable condition on 04/14/19.   Physical exam  Vitals:   04/13/19 1158 04/13/19 1711 04/13/19 2120 04/14/19 0532  BP:  111/71 120/78 116/83 111/72  Pulse: 79 85 77 81  Resp: 18 18 18 16   Temp: 98.1 F (36.7 C) 98 F (36.7 C) 98.3 F (36.8 C) 98.1 F (36.7 C)  TempSrc: Oral Oral Oral Oral  SpO2:  99% 100% 100%  Weight:      Height:       General: alert, cooperative and no distress Lochia: appropriate Uterine Fundus: firm Perineum: Intact DVT Evaluation: No evidence of DVT seen on physical exam. No cords or calf tenderness. No significant calf/ankle edema. Labs: Lab Results  Component Value Date   WBC 16.5 (H) 04/13/2019   HGB 9.2 (L) 04/13/2019   HCT 29.9 (L) 04/13/2019   MCV 93.1 04/13/2019   PLT 348 04/13/2019   CMP Latest Ref Rng & Units 01/17/2019  Glucose 70 - 99 mg/dL 120(H)  BUN 6 - 20 mg/dL <5(L)  Creatinine 0.44 - 1.00 mg/dL 0.60  Sodium 135 - 145 mmol/L 135  Potassium 3.5 - 5.1 mmol/L 3.5  Chloride 98 - 111 mmol/L 101  CO2 22 - 32 mmol/L 22  Calcium 8.9 - 10.3 mg/dL 8.6(L)  Total Protein 6.5 - 8.1  g/dL 6.9  Total Bilirubin 0.3 - 1.2 mg/dL 1.0  Alkaline Phos 38 - 126 U/L 43  AST 15 - 41 U/L 65(H)  ALT 0 - 44 U/L 27    Discharge instruction: per After Visit Summary and "Baby and Me Booklet".  After visit meds:  Allergies as of 04/14/2019      Reactions   Effexor [venlafaxine] Palpitations, Other (See Comments)   Panic attacks   Latex Rash   Tape irritates the skin/ please use paper tape   Tape Rash   pls use paper tape      Medication List    STOP taking these medications   aspirin EC 81 MG tablet   calcium carbonate 500 MG chewable tablet Commonly known as: TUMS - dosed in mg elemental calcium   docusate sodium 100 MG capsule Commonly known as: COLACE   FERROUS GLUCONATE PO   prenatal multivitamin Tabs tablet   progesterone 100 MG capsule Commonly known as: Prometrium     TAKE these medications   ibuprofen 600 MG tablet Commonly known as: ADVIL Take 1 tablet (600 mg total) by mouth every 6 (six) hours.   labetalol 100 MG tablet Commonly known as: NORMODYNE Take 1 tablet (100 mg total) by mouth 2 (two) times daily.       Diet: routine diet  Activity: Advance as tolerated. Pelvic rest for 6 weeks.   Outpatient follow up: Return to [facility] in 1 week for BP check and then in 6 weeks for regular postpartum visit.  Follow up Appt:No future appointments. Follow up Visit:No follow-ups on file.  Postpartum contraception: Abstinence  Newborn Data: Live born female  Birth Weight: 3 lb 4.9 oz (1500 g) APGAR: 8, 9  Newborn Delivery   Birth date/time: 04/13/2019 01:57:00 Delivery type: Vaginal, Spontaneous      Baby Feeding: Breast and Donor milk via NG Disposition:NICU   04/14/2019 Arrie Eastern, CNM

## 2019-04-16 ENCOUNTER — Ambulatory Visit: Payer: Self-pay

## 2019-04-16 LAB — SURGICAL PATHOLOGY

## 2019-04-16 NOTE — Lactation Note (Signed)
This note was copied from a baby's chart. Lactation Consultation Note  Patient Name: Susan [name] JMEQA'S Date: 04/16/2019 Reason for consult: Follow-up assessment;NICU baby;1st time breastfeeding;Primapara;Infant < 6lbs;Breast augmentation;Preterm <34wks  [id number]  F/U visit of P1 mom who delivered @ 31.2wks, baby in couplet care room but mom discharged and at bedside.  Mom states her milk came in during the night and she just finished pumping. Approximately 60ml EBM at bedside. Reviewed milk storage guidelines and pumping schedule. Mom pumping every 3 hours for 30 minutes. Informed mom now that her milk is in she can use pump on maintenance function.  Reviewed proper flange fit and mom denies any pain with pumping.  Encouraged STS as much as allowed and to look at baby during pumping and/or have his blanket nearby if not draped across her.  Reviewed IP lactation services and lactation phone number.   Mom active with WIC but was gifted used Spectra pump with new tubing. Offered to send for [facility] referral and mom states she doesn't think she needs another pump right now; knows to reach out if she changes her mind. Encouraged mom to review use of her Spectra pump for home use.  Interventions Interventions: DEBP;Skin to skin;Breast massage;Hand express;Expressed milk  Lactation Tools Discussed/Used WIC Program: Yes Pump Review: Setup, frequency, and cleaning;Milk Storage   Consult Status Consult Status: PRN Follow-up type: In-patient    Cranston Neighbor 04/16/2019, 9:09 AM

## 2019-04-20 ENCOUNTER — Ambulatory Visit: Payer: Self-pay

## 2019-04-20 NOTE — Lactation Note (Signed)
This note was copied from a baby's chart. Lactation Consultation Note: LC followed up to see mother but missed her . She was discharged and she went home for a while. Will continue to follow up as needed.   Patient Name: Susan [name] HQPRF'F Date: 04/20/2019     Maternal Data    Feeding Feeding Type: Breast Milk  LATCH Score                   Interventions    Lactation Tools Discussed/Used     Consult Status      Darla Lesches 04/20/2019, 3:33 PM

## 2019-05-09 ENCOUNTER — Ambulatory Visit: Payer: Self-pay

## 2019-05-09 NOTE — Lactation Note (Signed)
This note was copied from a baby's chart. Lactation Consultation Note  Patient Name: Susan [name] OACZY'S Date: 05/09/2019 Reason for consult: Follow-up assessment;NICU baby;Mother's request;1st time breastfeeding;Primapara;Breast augmentation;Preterm <34wks;Infant < 6lbs  NICU RN called for a consult with a mother of a 32 week old pre-term NICU female < 5 lbs. Mom has been sick with cold/flu like symptoms since November 17th and that has taken a toll on her supply. She's only been pumping 2-3 times day since then, but she used to pump every 3 hours prior getting sick.  Mom also voiced that days she's been sick she wasn't drinking enough water, she reported taking Theraflu, Dayquil, Tylenol severe cold & flu; and she just started Zyrtec for her allergies. She told LC that one of the NICU RNs told her to pump and dump, but when this [place] spoke with NICU RN Valinda Party she said that mom was told to pump and dump due to alcohol consumption, that was also documented in one of the progress notes from NP on 05/08/19 but it was also mentioned that mom is no longer consuming alcohol and NICU staff is OK with using her breastmilk.  However since mom hasn't been pumping consistently and not drinking enough water, plus taking these cold/flu medications that contains antihistamines; her milk supply has decreased substantially, she's only getting drops now, not even enough to fill the bottom of the bottle. Baby is currently on donor milk, but mom is willing to work with lactation to bring her supply back.   Feeding plan:  1. Encouraged mom to start pumping every 3 hours (bilateral pumping), at least 8 pumping sessions in 24 hours 2. Hand expression and breast massage were also encouraged prior pumping along with use of coconut oil for lubricating the flanges 3. She'll try to increase her water intake to 128 oz/ day, don't wait to drink until she's thirsty 4. She'll also try power pumping morning and evening    Mom reported all questions and concerns were answered, she's aware of LC OP services and will call PRN.  Maternal Data    Feeding Feeding Type: Donor Breast Milk Nipple Type: Nfant Extra Slow Flow (gold)  LATCH Score                   Interventions Interventions: Breast feeding basics reviewed  Lactation Tools Discussed/Used     Consult Status Consult Status: PRN Follow-up type: In-patient    Brondon Wann Francene Boyers 05/09/2019, 8:26 PM

## 2019-06-15 ENCOUNTER — Other Ambulatory Visit: Payer: Self-pay

## 2019-06-15 ENCOUNTER — Encounter (HOSPITAL_COMMUNITY): Payer: Self-pay | Admitting: Emergency Medicine

## 2019-06-15 ENCOUNTER — Inpatient Hospital Stay (HOSPITAL_COMMUNITY)
Admission: EM | Admit: 2019-06-15 | Discharge: 2019-06-18 | DRG: 683 | Disposition: A | Discharge status: Home / Self Care | Payer: Medicaid Other | Attending: Internal Medicine | Admitting: Internal Medicine

## 2019-06-15 ENCOUNTER — Emergency Department (HOSPITAL_COMMUNITY): Payer: Medicaid Other

## 2019-06-15 DIAGNOSIS — N179 Acute kidney failure, unspecified: Secondary | ICD-10-CM

## 2019-06-15 DIAGNOSIS — Z823 Family history of stroke: Secondary | ICD-10-CM

## 2019-06-15 DIAGNOSIS — Z881 Allergy status to other antibiotic agents status: Secondary | ICD-10-CM

## 2019-06-15 DIAGNOSIS — Z20822 Contact with and (suspected) exposure to covid-19: Secondary | ICD-10-CM | POA: Diagnosis present

## 2019-06-15 DIAGNOSIS — E872 Acidosis, unspecified: Secondary | ICD-10-CM

## 2019-06-15 DIAGNOSIS — I10 Essential (primary) hypertension: Secondary | ICD-10-CM | POA: Diagnosis present

## 2019-06-15 DIAGNOSIS — R030 Elevated blood-pressure reading, without diagnosis of hypertension: Secondary | ICD-10-CM | POA: Diagnosis present

## 2019-06-15 DIAGNOSIS — Z91040 Latex allergy status: Secondary | ICD-10-CM

## 2019-06-15 DIAGNOSIS — E860 Dehydration: Secondary | ICD-10-CM

## 2019-06-15 DIAGNOSIS — K7010 Alcoholic hepatitis without ascites: Secondary | ICD-10-CM | POA: Diagnosis present

## 2019-06-15 DIAGNOSIS — Z8719 Personal history of other diseases of the digestive system: Secondary | ICD-10-CM

## 2019-06-15 DIAGNOSIS — Z87891 Personal history of nicotine dependence: Secondary | ICD-10-CM

## 2019-06-15 DIAGNOSIS — E1110 Type 2 diabetes mellitus with ketoacidosis without coma: Secondary | ICD-10-CM | POA: Diagnosis present

## 2019-06-15 DIAGNOSIS — F1020 Alcohol dependence, uncomplicated: Secondary | ICD-10-CM | POA: Diagnosis present

## 2019-06-15 DIAGNOSIS — R739 Hyperglycemia, unspecified: Secondary | ICD-10-CM | POA: Diagnosis present

## 2019-06-15 DIAGNOSIS — Z91048 Other nonmedicinal substance allergy status: Secondary | ICD-10-CM

## 2019-06-15 DIAGNOSIS — E8729 Other acidosis: Secondary | ICD-10-CM

## 2019-06-15 DIAGNOSIS — Z833 Family history of diabetes mellitus: Secondary | ICD-10-CM

## 2019-06-15 DIAGNOSIS — R112 Nausea with vomiting, unspecified: Secondary | ICD-10-CM

## 2019-06-15 DIAGNOSIS — F1010 Alcohol abuse, uncomplicated: Secondary | ICD-10-CM | POA: Diagnosis present

## 2019-06-15 HISTORY — DX: Acute kidney failure, unspecified: N17.9

## 2019-06-15 LAB — URINALYSIS, ROUTINE W REFLEX MICROSCOPIC
Bacteria, UA: NONE SEEN
Glucose, UA: NEGATIVE mg/dL
Ketones, ur: 80 mg/dL — AB
Leukocytes,Ua: NEGATIVE
Nitrite: NEGATIVE
Protein, ur: 100 mg/dL — AB
Specific Gravity, Urine: 1.018 (ref 1.005–1.030)
pH: 5 (ref 5.0–8.0)

## 2019-06-15 LAB — CBC
HCT: 42.5 % (ref 36.0–46.0)
Hemoglobin: 12.6 g/dL (ref 12.0–15.0)
MCH: 29 pg (ref 26.0–34.0)
MCHC: 29.6 g/dL — ABNORMAL LOW (ref 30.0–36.0)
MCV: 97.9 fL (ref 80.0–100.0)
Platelets: 383 K/uL (ref 150–400)
RBC: 4.34 MIL/uL (ref 3.87–5.11)
RDW: 19 % — ABNORMAL HIGH (ref 11.5–15.5)
WBC: 12.9 K/uL — ABNORMAL HIGH (ref 4.0–10.5)
nRBC: 0 % (ref 0.0–0.2)

## 2019-06-15 LAB — TROPONIN I (HIGH SENSITIVITY): Troponin I (High Sensitivity): 5 ng/L

## 2019-06-15 LAB — BASIC METABOLIC PANEL WITH GFR
Anion gap: 27 — ABNORMAL HIGH (ref 5–15)
BUN: 24 mg/dL — ABNORMAL HIGH (ref 6–20)
CO2: 11 mmol/L — ABNORMAL LOW (ref 22–32)
Calcium: 9.2 mg/dL (ref 8.9–10.3)
Chloride: 99 mmol/L (ref 98–111)
Creatinine, Ser: 2.19 mg/dL — ABNORMAL HIGH (ref 0.44–1.00)
GFR calc Af Amer: 32 mL/min — ABNORMAL LOW
GFR calc non Af Amer: 28 mL/min — ABNORMAL LOW
Glucose, Bld: 268 mg/dL — ABNORMAL HIGH (ref 70–99)
Potassium: 5.1 mmol/L (ref 3.5–5.1)
Sodium: 137 mmol/L (ref 135–145)

## 2019-06-15 LAB — I-STAT BETA HCG BLOOD, ED (MC, WL, AP ONLY): I-stat hCG, quantitative: <5 m[IU]/mL (ref <5)

## 2019-06-15 MED ORDER — ONDANSETRON 4 MG PO TBDP
ORAL_TABLET | ORAL | Status: AC
  Filled 2019-06-15: qty 1

## 2019-06-15 MED ORDER — SODIUM CHLORIDE 0.9% FLUSH
3.0000 mL | Freq: Once | INTRAVENOUS | Status: AC
  Administered 2019-06-16: 3 mL via INTRAVENOUS

## 2019-06-15 MED ORDER — PROMETHAZINE HCL 25 MG/ML IJ SOLN
12.5000 mg | Freq: Once | INTRAMUSCULAR | Status: AC
  Administered 2019-06-15: 12.5 mg via INTRAMUSCULAR
  Filled 2019-06-15: qty 1

## 2019-06-15 MED ORDER — ONDANSETRON 4 MG PO TBDP
4.0000 mg | ORAL_TABLET | Freq: Once | ORAL | Status: AC
  Administered 2019-06-15: 4 mg via ORAL

## 2019-06-15 NOTE — ED Notes (Signed)
Pt stopped NT stating she was having chest tightness and shob. More lab work obtained, vitals rechecked and another ekg obtained.

## 2019-06-15 NOTE — ED Triage Notes (Signed)
Pt reports N/V, vaginal bleeding and lightheadedness. Reports being 8 weeks post partum. Endorses SOIB

## 2019-06-16 ENCOUNTER — Encounter (HOSPITAL_COMMUNITY): Payer: Self-pay | Admitting: Internal Medicine

## 2019-06-16 DIAGNOSIS — Z823 Family history of stroke: Secondary | ICD-10-CM | POA: Diagnosis not present

## 2019-06-16 DIAGNOSIS — Z8719 Personal history of other diseases of the digestive system: Secondary | ICD-10-CM | POA: Diagnosis not present

## 2019-06-16 DIAGNOSIS — R030 Elevated blood-pressure reading, without diagnosis of hypertension: Secondary | ICD-10-CM | POA: Diagnosis present

## 2019-06-16 DIAGNOSIS — Z91048 Other nonmedicinal substance allergy status: Secondary | ICD-10-CM | POA: Diagnosis not present

## 2019-06-16 DIAGNOSIS — E860 Dehydration: Secondary | ICD-10-CM | POA: Diagnosis present

## 2019-06-16 DIAGNOSIS — N179 Acute kidney failure, unspecified: Secondary | ICD-10-CM | POA: Diagnosis present

## 2019-06-16 DIAGNOSIS — F1010 Alcohol abuse, uncomplicated: Secondary | ICD-10-CM | POA: Diagnosis not present

## 2019-06-16 DIAGNOSIS — Z881 Allergy status to other antibiotic agents status: Secondary | ICD-10-CM | POA: Diagnosis not present

## 2019-06-16 DIAGNOSIS — Z20822 Contact with and (suspected) exposure to covid-19: Secondary | ICD-10-CM | POA: Diagnosis present

## 2019-06-16 DIAGNOSIS — Z833 Family history of diabetes mellitus: Secondary | ICD-10-CM | POA: Diagnosis not present

## 2019-06-16 DIAGNOSIS — R112 Nausea with vomiting, unspecified: Secondary | ICD-10-CM | POA: Diagnosis present

## 2019-06-16 DIAGNOSIS — R739 Hyperglycemia, unspecified: Secondary | ICD-10-CM | POA: Diagnosis present

## 2019-06-16 DIAGNOSIS — Z91040 Latex allergy status: Secondary | ICD-10-CM | POA: Diagnosis not present

## 2019-06-16 DIAGNOSIS — E1110 Type 2 diabetes mellitus with ketoacidosis without coma: Secondary | ICD-10-CM | POA: Diagnosis present

## 2019-06-16 DIAGNOSIS — Z87891 Personal history of nicotine dependence: Secondary | ICD-10-CM | POA: Diagnosis not present

## 2019-06-16 DIAGNOSIS — F1020 Alcohol dependence, uncomplicated: Secondary | ICD-10-CM | POA: Diagnosis present

## 2019-06-16 DIAGNOSIS — E872 Acidosis: Secondary | ICD-10-CM | POA: Diagnosis present

## 2019-06-16 LAB — CBG MONITORING, ED
Glucose-Capillary: 176 mg/dL — ABNORMAL HIGH (ref 70–99)
Glucose-Capillary: 173 mg/dL — ABNORMAL HIGH (ref 70–99)
Glucose-Capillary: 129 mg/dL — ABNORMAL HIGH (ref 70–99)
Glucose-Capillary: 147 mg/dL — ABNORMAL HIGH (ref 70–99)
Glucose-Capillary: 131 mg/dL — ABNORMAL HIGH (ref 70–99)
Glucose-Capillary: 128 mg/dL — ABNORMAL HIGH (ref 70–99)
Glucose-Capillary: 140 mg/dL — ABNORMAL HIGH (ref 70–99)
Glucose-Capillary: 151 mg/dL — ABNORMAL HIGH (ref 70–99)
Glucose-Capillary: 111 mg/dL — ABNORMAL HIGH (ref 70–99)
Glucose-Capillary: 112 mg/dL — ABNORMAL HIGH (ref 70–99)

## 2019-06-16 LAB — HEPATIC FUNCTION PANEL
ALT: 228 U/L — ABNORMAL HIGH (ref 0–44)
AST: 303 U/L — ABNORMAL HIGH (ref 15–41)
Albumin: 5.1 g/dL — ABNORMAL HIGH (ref 3.5–5.0)
Alkaline Phosphatase: 137 U/L — ABNORMAL HIGH (ref 38–126)
Bilirubin, Direct: 0.2 mg/dL (ref 0.0–0.2)
Indirect Bilirubin: 1.3 mg/dL — ABNORMAL HIGH (ref 0.3–0.9)
Total Bilirubin: 1.5 mg/dL — ABNORMAL HIGH (ref 0.3–1.2)
Total Protein: 9.5 g/dL — ABNORMAL HIGH (ref 6.5–8.1)

## 2019-06-16 LAB — POCT I-STAT EG7
Acid-base deficit: 7 mmol/L — ABNORMAL HIGH (ref 0.0–2.0)
Bicarbonate: 16.9 mmol/L — ABNORMAL LOW (ref 20.0–28.0)
Calcium, Ion: 1.22 mmol/L (ref 1.15–1.40)
HCT: 44 % (ref 36.0–46.0)
Hemoglobin: 15 g/dL (ref 12.0–15.0)
O2 Saturation: 67 %
Potassium: 5.4 mmol/L — ABNORMAL HIGH (ref 3.5–5.1)
Sodium: 140 mmol/L (ref 135–145)
TCO2: 18 mmol/L — ABNORMAL LOW (ref 22–32)
pCO2, Ven: 29.4 mmHg — ABNORMAL LOW (ref 44.0–60.0)
pH, Ven: 7.368 (ref 7.250–7.430)
pO2, Ven: 35 mmHg (ref 32.0–45.0)

## 2019-06-16 LAB — BASIC METABOLIC PANEL
Anion gap: 16 — ABNORMAL HIGH (ref 5–15)
Anion gap: 15 (ref 5–15)
Anion gap: 12 (ref 5–15)
Anion gap: 12 (ref 5–15)
BUN: 34 mg/dL — ABNORMAL HIGH (ref 6–20)
BUN: 28 mg/dL — ABNORMAL HIGH (ref 6–20)
BUN: 23 mg/dL — ABNORMAL HIGH (ref 6–20)
BUN: 18 mg/dL (ref 6–20)
CO2: 16 mmol/L — ABNORMAL LOW (ref 22–32)
CO2: 18 mmol/L — ABNORMAL LOW (ref 22–32)
CO2: 20 mmol/L — ABNORMAL LOW (ref 22–32)
CO2: 22 mmol/L (ref 22–32)
Calcium: 9.3 mg/dL (ref 8.9–10.3)
Calcium: 9.3 mg/dL (ref 8.9–10.3)
Calcium: 9.2 mg/dL (ref 8.9–10.3)
Calcium: 9.1 mg/dL (ref 8.9–10.3)
Chloride: 109 mmol/L (ref 98–111)
Chloride: 105 mmol/L (ref 98–111)
Chloride: 104 mmol/L (ref 98–111)
Chloride: 101 mmol/L (ref 98–111)
Creatinine, Ser: 1.57 mg/dL — ABNORMAL HIGH (ref 0.44–1.00)
Creatinine, Ser: 1.27 mg/dL — ABNORMAL HIGH (ref 0.44–1.00)
Creatinine, Ser: 1.03 mg/dL — ABNORMAL HIGH (ref 0.44–1.00)
Creatinine, Ser: 0.91 mg/dL (ref 0.44–1.00)
GFR calc Af Amer: 48 mL/min — ABNORMAL LOW (ref >60)
GFR calc Af Amer: >60 mL/min (ref >60)
GFR calc Af Amer: >60 mL/min (ref >60)
GFR calc Af Amer: >60 mL/min (ref >60)
GFR calc non Af Amer: 41 mL/min — ABNORMAL LOW (ref >60)
GFR calc non Af Amer: 53 mL/min — ABNORMAL LOW (ref >60)
GFR calc non Af Amer: >60 mL/min (ref >60)
GFR calc non Af Amer: >60 mL/min (ref >60)
Glucose, Bld: 143 mg/dL — ABNORMAL HIGH (ref 70–99)
Glucose, Bld: 131 mg/dL — ABNORMAL HIGH (ref 70–99)
Glucose, Bld: 114 mg/dL — ABNORMAL HIGH (ref 70–99)
Glucose, Bld: 141 mg/dL — ABNORMAL HIGH (ref 70–99)
Potassium: 4.5 mmol/L (ref 3.5–5.1)
Potassium: 4.4 mmol/L (ref 3.5–5.1)
Potassium: 4.1 mmol/L (ref 3.5–5.1)
Potassium: 3.1 mmol/L — ABNORMAL LOW (ref 3.5–5.1)
Sodium: 141 mmol/L (ref 135–145)
Sodium: 138 mmol/L (ref 135–145)
Sodium: 136 mmol/L (ref 135–145)
Sodium: 135 mmol/L (ref 135–145)

## 2019-06-16 LAB — MAGNESIUM: Magnesium: 2.5 mg/dL — ABNORMAL HIGH (ref 1.7–2.4)

## 2019-06-16 LAB — SARS CORONAVIRUS 2 (TAT 6-24 HRS): SARS Coronavirus 2: NEGATIVE

## 2019-06-16 LAB — BETA-HYDROXYBUTYRIC ACID
Beta-Hydroxybutyric Acid: 7.32 mmol/L — ABNORMAL HIGH (ref 0.05–0.27)
Beta-Hydroxybutyric Acid: 4.11 mmol/L — ABNORMAL HIGH (ref 0.05–0.27)
Beta-Hydroxybutyric Acid: 2.27 mmol/L — ABNORMAL HIGH (ref 0.05–0.27)

## 2019-06-16 LAB — HIV ANTIBODY (ROUTINE TESTING W REFLEX): HIV Screen 4th Generation wRfx: NONREACTIVE

## 2019-06-16 LAB — PHOSPHORUS: Phosphorus: 2 mg/dL — ABNORMAL LOW (ref 2.5–4.6)

## 2019-06-16 LAB — LIPASE, BLOOD: Lipase: 159 U/L — ABNORMAL HIGH (ref 11–51)

## 2019-06-16 LAB — HEMOGLOBIN A1C
Hgb A1c MFr Bld: 5.3 % (ref 4.8–5.6)
Mean Plasma Glucose: 105.41 mg/dL

## 2019-06-16 LAB — TROPONIN I (HIGH SENSITIVITY): Troponin I (High Sensitivity): 5 ng/L (ref <18)

## 2019-06-16 MED ORDER — INSULIN REGULAR(HUMAN) IN NACL 100-0.9 UT/100ML-% IV SOLN
INTRAVENOUS | Status: DC
  Administered 2019-06-16: 1.5 [IU]/h via INTRAVENOUS
  Filled 2019-06-16: qty 100

## 2019-06-16 MED ORDER — INSULIN REGULAR(HUMAN) IN NACL 100-0.9 UT/100ML-% IV SOLN: INTRAVENOUS | Status: DC

## 2019-06-16 MED ORDER — THIAMINE HCL 100 MG/ML IJ SOLN: 100.0000 mg | Freq: Every day | INTRAMUSCULAR | Status: DC

## 2019-06-16 MED ORDER — DEXTROSE-NACL 5-0.45 % IV SOLN: INTRAVENOUS | Status: DC

## 2019-06-16 MED ORDER — FENTANYL CITRATE (PF) 100 MCG/2ML IJ SOLN
50.0000 ug | Freq: Once | INTRAMUSCULAR | Status: AC
  Administered 2019-06-16: 50 ug via INTRAVENOUS
  Filled 2019-06-16: qty 2

## 2019-06-16 MED ORDER — ONDANSETRON HCL 4 MG/2ML IJ SOLN
4.0000 mg | Freq: Once | INTRAMUSCULAR | Status: AC
  Administered 2019-06-16: 4 mg via INTRAVENOUS
  Filled 2019-06-16: qty 2

## 2019-06-16 MED ORDER — ACETAMINOPHEN 325 MG PO TABS
650.0000 mg | ORAL_TABLET | Freq: Four times a day (QID) | ORAL | Status: DC | PRN
  Administered 2019-06-16: 650 mg via ORAL
  Filled 2019-06-16: qty 2

## 2019-06-16 MED ORDER — FOLIC ACID 1 MG PO TABS
1.0000 mg | ORAL_TABLET | Freq: Every day | ORAL | Status: DC
  Administered 2019-06-16 – 2019-06-17 (×2): 1 mg via ORAL
  Filled 2019-06-16 (×2): qty 1

## 2019-06-16 MED ORDER — DEXTROSE 50 % IV SOLN: 0.0000 mL | INTRAVENOUS | Status: DC | PRN

## 2019-06-16 MED ORDER — ALUM & MAG HYDROXIDE-SIMETH 200-200-20 MG/5ML PO SUSP
30.0000 mL | Freq: Once | ORAL | Status: AC
  Administered 2019-06-16: 30 mL via ORAL
  Filled 2019-06-16: qty 30

## 2019-06-16 MED ORDER — THIAMINE HCL 100 MG PO TABS
100.0000 mg | ORAL_TABLET | Freq: Every day | ORAL | Status: DC
  Administered 2019-06-16 – 2019-06-17 (×2): 100 mg via ORAL
  Filled 2019-06-16 (×2): qty 1

## 2019-06-16 MED ORDER — SODIUM CHLORIDE 0.9 % IV SOLN: INTRAVENOUS | Status: DC

## 2019-06-16 MED ORDER — LACTATED RINGERS IV BOLUS
2000.0000 mL | Freq: Once | INTRAVENOUS | Status: AC
  Administered 2019-06-16: 2000 mL via INTRAVENOUS

## 2019-06-16 MED ORDER — LORAZEPAM 2 MG/ML IJ SOLN: 1.0000 mg | INTRAMUSCULAR | Status: DC | PRN

## 2019-06-16 MED ORDER — ENOXAPARIN SODIUM 40 MG/0.4ML ~~LOC~~ SOLN
40.0000 mg | SUBCUTANEOUS | Status: DC
  Administered 2019-06-16 – 2019-06-17 (×2): 40 mg via SUBCUTANEOUS
  Filled 2019-06-16 (×2): qty 0.4

## 2019-06-16 MED ORDER — ADULT MULTIVITAMIN W/MINERALS CH
1.0000 | ORAL_TABLET | Freq: Every day | ORAL | Status: DC
  Administered 2019-06-16 – 2019-06-17 (×2): 1 via ORAL
  Filled 2019-06-16 (×2): qty 1

## 2019-06-16 MED ORDER — LACTATED RINGERS IV SOLN: INTRAVENOUS | Status: DC

## 2019-06-16 MED ORDER — LORAZEPAM 1 MG PO TABS: 1.0000 mg | ORAL_TABLET | ORAL | Status: DC | PRN

## 2019-06-16 MED ORDER — ONDANSETRON HCL 4 MG/2ML IJ SOLN
4.0000 mg | Freq: Four times a day (QID) | INTRAMUSCULAR | Status: DC | PRN
  Administered 2019-06-16 – 2019-06-17 (×3): 4 mg via INTRAVENOUS
  Filled 2019-06-16 (×3): qty 2

## 2019-06-16 NOTE — ED Notes (Signed)
Pt ambulated self efficiently to restroom with no difficulty. Pt returned safely to bedside. 

## 2019-06-16 NOTE — ED Provider Notes (Addendum)
TIME SEEN: 6:09 AM  CHIEF COMPLAINT: Nausea, vomiting, lightheadedness  HPI: Patient is a 39 year old female with previous history of alcohol abuse who presents to the emergency department with nausea, vomiting that started with her menstrual cycle 2 days ago.  She states it is not abnormal for her to have heavy menstrual cycles and have nausea and vomiting with them but states that she has not been able to keep anything down over the past 48 hours.  Having some lower abdominal discomfort associated with her menstrual cramps.  No diarrhea.  No fever.  Patient had normal spontaneous vaginal delivery on 04/13/2019.  She is no longer pumping or breast-feeding at this time.  States that she no longer has an issue with alcohol.  States that this was an issue for her when she was going through a divorce.  States she did have a small amount of alcohol on New Year's Eve.  Also a small amount Sunday, January 3.  She has no known history of kidney disease.  No known Covid exposures.  Did have some chest tightness in the waiting room.   ROS: See HPI Constitutional: no fever  Eyes: no drainage  ENT: no runny nose   Cardiovascular:   chest pain  Resp: no SOB  GI:  + vomiting GU: no dysuria Integumentary: no rash  Allergy: no hives  Musculoskeletal: no leg swelling  Neurological: no slurred speech ROS otherwise negative  PAST MEDICAL HISTORY/PAST SURGICAL HISTORY:  Past Medical History:  Diagnosis Date  . Alcohol abuse   . Depression   . Family history of adverse reaction to anesthesia    MOTHER HAD PANIC ATTACKS & NAUSEA  AFTER  . Gastritis   . GIB (gastrointestinal bleeding)   . Iron deficiency anemia   . Pancreatitis     MEDICATIONS:  Prior to Admission medications   Medication Sig Start Date End Date Taking? Authorizing Provider  ibuprofen (ADVIL) 600 MG tablet Take 1 tablet (600 mg total) by mouth every 6 (six) hours. 04/14/19   Arrie Eastern, CNM  labetalol (NORMODYNE) 100 MG tablet  Take 1 tablet (100 mg total) by mouth 2 (two) times daily. 01/18/19   Everett Graff, MD    ALLERGIES:  Allergies  Allergen Reactions  . Effexor [Venlafaxine] Palpitations and Other (See Comments)    Panic attacks  . Latex Rash    Tape irritates the skin/ please use paper tape  . Tape Rash    pls use paper tape    SOCIAL HISTORY:  Social History   Tobacco Use  . Smoking status: Former Research scientist (life sciences)  . Smokeless tobacco: Never Used  Substance Use Topics  . Alcohol use: Not Currently    Comment: occasional    FAMILY HISTORY: Family History  Problem Relation Age of Onset  . Stroke Mother     EXAM: BP (!) 132/103 (BP Location: Left Arm)   Pulse (!) 110   Temp 98 F (36.7 C) (Oral)   Resp 20   LMP 04/14/2018   SpO2 100%  CONSTITUTIONAL: Alert and oriented and responds appropriately to questions.  In no apparent distress, afebrile, extremely pleasant HEAD: Normocephalic EYES: Conjunctivae clear, pupils appear equal, EOM appear intact ENT: normal nose; dry mucous membranes NECK: Supple, normal ROM CARD: Regular and tachycardic; S1 and S2 appreciated; no murmurs, no clicks, no rubs, no gallops RESP: Normal chest excursion without splinting or tachypnea; breath sounds clear and equal bilaterally; no wheezes, no rhonchi, no rales, no hypoxia or respiratory distress, speaking  full sentences ABD/GI: Normal bowel sounds; non-distended; soft, non-tender, no rebound, no guarding, no peritoneal signs, no hepatosplenomegaly BACK:  The back appears normal, bruising noted to the back associated with cupping EXT: Normal ROM in all joints; no deformity noted, no edema; no cyanosis SKIN: Normal color for age and race; warm; no rash on exposed skin NEURO: Moves all extremities equally PSYCH: The patient's mood and manner are appropriate.   MEDICAL DECISION MAKING: Patient here with nausea, vomiting the setting of her menstrual cycle.  States this is her first menstrual cycle after having a baby  on November 3.  States she is passing some clots which is normal for her and it is also normal for her to have nausea and vomiting but states that she has not been able to keep anything down for 2 days.  She appears dry on exam with dry mucous membranes and is tachycardic.  Her abdominal exam is benign.  Labs show acute kidney injury with a creatinine of 6.33 and metabolic acidosis.  She does have an elevated anion gap.  Blood glucose is also elevated at 268.  She has no known history of diabetes, gestational diabetes, DKA.  Will recheck blood sugar.  Patient may need to be started on an insulin infusion.  Will start IV hydration.  Will give fentanyl, Zofran for symptomatic relief.  Denies history of heavy alcohol use.  Last alcohol use was 72 hours ago.  I do not think this is alcoholic ketoacidosis given patient's report that she no longer drinks heavily however this is on the differential.  Did have some chest tightness in the waiting room.  EKG shows no ischemic abnormality.  Troponin x2 is normal.  Chest x-ray clear.   Abdominal exam benign.  Doubt endometritis, torsion, TOA, PID, appendicitis, colitis, diverticulitis, bowel obstruction.  States most of her abdominal discomfort is lower from her menstrual cycle.  We will add on LFTs, lipase.  I do not feel she needs emergent abdominal imaging currently.   ED PROGRESS: Repeat blood glucose is 176 which is still quite elevated given she is not able to tolerate p.o.  Given she has a bicarb of 11 with an anion gap of 27 with only having a BUN of 24, I do not think I can blame it uremia as the cause of the significant elevated anion gap metabolic acidosis.  Will start insulin infusion.  We will also add on magnesium level.  Potassium is 5.1.  6:20 AM Discussed patient's case with hospitalist, Dr. Maudie Mercury.  I have recommended admission and patient (and family if present) agree with this plan. Admitting physician will place admission orders.   I reviewed all  nursing notes, vitals, pertinent previous records and interpreted all EKGs, lab and urine results, imaging (as available).   CRITICAL CARE Performed by: Pryor Curia   Total critical care time: 45 minutes  Critical care time was exclusive of separately billable procedures and treating other patients.  Critical care was necessary to treat or prevent imminent or life-threatening deterioration.  Critical care was time spent personally by me on the following activities: development of treatment plan with patient and/or surrogate as well as nursing, discussions with consultants, evaluation of patient's response to treatment, examination of patient, obtaining history from patient or surrogate, ordering and performing treatments and interventions, ordering and review of laboratory studies, ordering and review of radiographic studies, pulse oximetry and re-evaluation of patient's condition.   ZAIDE MCCLENAHAN was evaluated in Emergency Department on 06/16/2019 for  the symptoms described in the history of present illness. She was evaluated in the context of the global COVID-19 pandemic, which necessitated consideration that the patient might be at risk for infection with the SARS-CoV-2 virus that causes COVID-19. Institutional protocols and algorithms that pertain to the evaluation of patients at risk for COVID-19 are in a state of rapid change based on information released by regulatory bodies including the CDC and federal and state organizations. These policies and algorithms were followed during the patient's care in the ED.  Patient was seen wearing N95, face shield, gloves.    Daelon Dunivan, Delice Bison, DO 06/16/19 0622    Patient's LFTs came back elevated.  I now suspect that she is drinking more heavily than she is admitting to making it more likely that this is alcohol induced ketoacidosis.  It appears hospitalist has discontinued insulin infusion as her hemoglobin A1c was normal.   Nattie Lazenby, Delice Bison,  DO 06/17/19 0246

## 2019-06-16 NOTE — Plan of Care (Signed)
39 yo female with hx of ETOH abuse ,recent pregnancy has intractable nausea and vomitting. Has mild renal failure ED requesting admission for DKA, ARF.   Na 137, K 5.1 Bun 24, Creatinie 2.19 Hco3 11, AG 27 Urinalysis + ketonase,  Beta hydroxybutyrate pending ABG pending

## 2019-06-16 NOTE — Progress Notes (Signed)
Inpatient Diabetes Program Recommendations  AACE/ADA: New Consensus Statement on Inpatient Glycemic Control (2015)  Target Ranges:  Prepandial:   less than 140 mg/dL      Peak postprandial:   less than 180 mg/dL (1-2 hours)      Critically ill patients:  140 - 180 mg/dL   Lab Results  Component Value Date   GLUCAP 128 (H) 06/16/2019   HGBA1C 5.3 06/16/2019    Review of Glycemic Control Results for Susan [name], Susan [name] (MRN [id number]) as of 06/16/2019 13:17  Ref. Range 06/16/2019 10:01 06/16/2019 11:05 06/16/2019 12:16  Glucose-Capillary Latest Ref Range: 70 - 99 mg/dL 147 (H) 131 (H) 128 (H)   Diabetes history: No DM hx Outpatient Diabetes medications: none Current orders for Inpatient glycemic control: IV insulin  Inpatient Diabetes Program Recommendations:    Recommend continuing with IV insulin until beta hydroxybutyrate results trend down to <1.0- OR- 2 occurrences of improved BMET with CO2 and AG.  Then could transition with Novolog 0-9 units Q4H until able to tolerate oral intake.   Thanks, Bronson Curb, MSN, RNC-OB Diabetes Coordinator [phone] (8a-5p)

## 2019-06-16 NOTE — ED Notes (Signed)
Patient in bed resting comfortably, VSS; update given regarding disposition. Informed patient unable to provide PO nourishment at time and verbalized understanding. Call light in reach, will continue to monitor.

## 2019-06-16 NOTE — ED Notes (Signed)
Paging Dr Sharlet Salina to request some Pepcid or something to help with reflux and burps.

## 2019-06-16 NOTE — H&P (Signed)
History and Physical    Susan [name] QTM:[id number] DOB: 1980-11-24 DOA: 06/15/2019  PCP: Cathlean Sauer, MD Consultants:  None Patient coming from:  Home - lives with infant; NOK: Mother, [phone]  Chief Complaint: N/V  HPI: Susan [name] is a 39 y.o. female with medical history significant of alcohol dependence; GI bleeding; pancreatitis; and recent SVD (11/3) presenting with n/v.  She is starting her first period after having a newborn and noticed a lot of pain, blood, n/v.  2 days ago she couldn't stop vomiting and felt very dehydrated.  Her throat hurts now from vomiting.  She felt very dizzy and light-headed.  She does not have a h/o DM.  Thinks she might have had something about diabetes with the pregnancy but everything seemed ok in the pregnancy.  She denies drinking much alcohol - 3 days and ago and on New year's Eve she drank a couple of glasses of vodka.    She previously had admissions for alcoholic pancreatitis in 42/8768 and 04/5725 and had metabolic acidosis during the October admission, as well.  She reported heavy ETOH at that time.   ED Course:  Carryover, per Dr. Maudie Mercury:  39 yo female with hx of ETOH abuse ,recent pregnancy has intractable nausea and vomitting. Has mild renal failure ED requesting admission for DKA, ARF.   Review of Systems: As per HPI; otherwise review of systems reviewed and negative.   Ambulatory Status:  Ambulates without assistance  Past Medical History:  Diagnosis Date  . Alcohol abuse   . Depression   . Family history of adverse reaction to anesthesia    MOTHER HAD PANIC ATTACKS & NAUSEA  AFTER  . Gastritis   . GIB (gastrointestinal bleeding)   . Iron deficiency anemia   . Pancreatitis     Past Surgical History:  Procedure Laterality Date  . CERVICAL CERCLAGE Bilateral 01/18/2019   Procedure: CERCLAGE CERVICAL;  Surgeon: Everett Graff, MD;  Location: [facility];  Service: Gynecology;  Laterality: Bilateral;  .  ESOPHAGOGASTRODUODENOSCOPY (EGD) WITH PROPOFOL N/A 06/05/2016   Procedure: ESOPHAGOGASTRODUODENOSCOPY (EGD) WITH PROPOFOL;  Surgeon: Otis Brace, MD;  Location: [facility];  Service: Gastroenterology;  Laterality: N/A;  . PLACEMENT OF BREAST IMPLANTS      Social History   Socioeconomic History  . Marital status: Single    Spouse name: Not on file  . Number of children: Not on file  . Years of education: Not on file  . Highest education level: Not on file  Occupational History  . Occupation: Careers adviser, currently unemployed  Tobacco Use  . Smoking status: Former Smoker    Packs/day: 0.50    Years: 15.00    Pack years: 7.50    Quit date: 2015    Years since quitting: 6.0  . Smokeless tobacco: Never Used  Substance and Sexual Activity  . Alcohol use: Yes    Comment: occasional, ?heavy intermittent use  . Drug use: No  . Sexual activity: Yes    Birth control/protection: None  Other Topics Concern  . Not on file  Social History Narrative  . Not on file   Social Determinants of Health   Financial Resource Strain:   . Difficulty of Paying Living Expenses: Not on file  Food Insecurity:   . Worried About Charity fundraiser in the Last Year: Not on file  . Ran Out of Food in the Last Year: Not on file  Transportation Needs:   . Lack of Transportation (Medical): Not on  file  . Lack of Transportation (Non-Medical): Not on file  Physical Activity:   . Days of Exercise per Week: Not on file  . Minutes of Exercise per Session: Not on file  Stress:   . Feeling of Stress : Not on file  Social Connections:   . Frequency of Communication with Friends and Family: Not on file  . Frequency of Social Gatherings with Friends and Family: Not on file  . Attends Religious Services: Not on file  . Active Member of Clubs or Organizations: Not on file  . Attends Archivist Meetings: Not on file  . Marital Status: Not on file  Intimate Partner Violence:   . Fear of Current or  Ex-Partner: Not on file  . Emotionally Abused: Not on file  . Physically Abused: Not on file  . Sexually Abused: Not on file    Allergies  Allergen Reactions  . Effexor [Venlafaxine] Palpitations and Other (See Comments)    Panic attacks  . Latex Rash    Tape irritates the skin/ please use paper tape  . Tape Rash    pls use paper tape    Family History  Problem Relation Age of Onset  . Stroke Mother   . Diabetes Maternal Grandmother     Prior to Admission medications   Medication Sig Start Date End Date Taking? Authorizing Provider  docusate sodium (COLACE) 100 MG capsule Take 100 mg by mouth daily as needed for mild constipation.   Yes [provider]  Prenatal Vit-Fe Fumarate-FA (PRENATAL MULTIVITAMIN) TABS tablet Take 1 tablet by mouth daily.   Yes [provider]    Physical Exam: Vitals:   06/16/19 0900 06/16/19 0930 06/16/19 1030 06/16/19 1100  BP: 135/89 (!) 128/91 136/87 (!) 139/97  Pulse: 85 90 95 99  Resp: 19 16 (!) 26 (!) 33  Temp:      TempSrc:      SpO2: 98% 98% 100% 99%     . General:  Appears calm and comfortable and is NAD; somewhat ill-appearing . Eyes:  PERRL, EOMI, normal lids, iris . ENT:  grossly normal hearing, lips & tongue, mildly dry mm; appropriate dentition . Neck:  no LAD, masses or thyromegaly . Cardiovascular:  RR with mild tachycardia, no m/r/g. No LE edema.  Marland Kitchen Respiratory:   CTA bilaterally with no wheezes/rales/rhonchi.  Normal respiratory effort. . Abdomen:  soft, NT, ND, NABS . Skin:  no rash or induration seen on limited exam . Musculoskeletal:  grossly normal tone BUE/BLE, good ROM, no bony abnormality . Psychiatric:  grossly normal mood and affect, speech fluent and appropriate, AOx3 . Neurologic:  CN 2-12 grossly intact, moves all extremities in coordinated fashion, sensation intact    Radiological Exams on Admission: DG Chest 2 View  Result Date: 06/15/2019 CLINICAL DATA:  Chest tightness. EXAM: CHEST -  2 VIEW COMPARISON:  March 22, 2018 FINDINGS: The heart size and mediastinal contours are within normal limits. Both lungs are clear. The visualized skeletal structures are unremarkable. IMPRESSION: No active cardiopulmonary disease. Electronically Signed   By: Virgina Norfolk M.D.   On: 06/15/2019 21:38    EKG: Independently reviewed.  Sinus tachycardia with rate 113; no evidence of acute ischemia   Labs on Admission: I have personally reviewed the available labs and imaging studies at the time of the admission.  Pertinent labs:   CO2 11 -> 16 Glucose 268 ->143 BUN 24/Creatinine 2.19/GFR 28 -> 34/1.57/41 Anion gap 27 -> 16 AP 137 Lipiase  159 AST 303/ALT 228/Bili 1.5 WBC 12.9 HCG <5 UA: moderate Hgb; 80 ketones; 100 protein VBG: 7.368/29.3/16.9 A1c 5.3  Assessment/Plan Principal Problem:   Metabolic acidosis, increased anion gap Active Problems:   Alcohol abuse   AKI (acute kidney injury) (HCC)   Alcoholic hepatitis without ascites   Chronic hypertension   SVD (11/3)   Anion gap metabolic acidosis -While initial thought was that this was DKA given her hyperglycemia on admission, she does not have a known h/o DM and her A1c was 5.3 -Instead, this appears to be more likely starvation/alcoholic acidosis with acute renal failure as the driving factor -No indication of illness as source -She has been started on the Endotool but is no longer needing insulin - this was likely stress-induced hyperglycemia in conjunction with the acidosis -Will admit to SDU for ongoing close monitoring, anticipate at least 1 additional night of care required for stabilization/improvement -IVF at 150 cc/hr, NS until glucose <250 and then decrease rate to 125 and change to D51/2NS -Assuming ongoing closure of her gap and improvement in her acidosis with ongoing q4h BMPs, it is likely reasonable to allow her to eat and stop such aggressive care soon - if she does not have a bed by then, can change her  to Med Tele -DM coordinator consulted - although it is not clear that she will need any medication for this issue  AKI -Prerenal azotemia in the setting of severe dehydration with ketoacidosis -This is already improving and anticipate normalization -Continue with q4 BMP for now  ETOH abuse -Patient with h/o prior hospitalizations associated with her ETOH use -She reported minimal use initially but upon further questioning acknowledged drinking 2+ vodka drinks on several recent occasions -Given her prior history and current presentation, this raises concern for ongoing ETOH abuse -She also has marked LFT elevation that would be consistent with alcoholic hepatitis -For now will monitor on CIWA -TOC team consultation requested  HTN -Prior h/o this issue, does not appear to be taking medications -Consider medication depending on trend while hospitalized -Needs outpatient PCP f/u  Recent SVD -Patient has custody and infant's father cares for him 1-2 days a week -She is not breast feeding    Note: This patient has been tested and is pending for the novel coronavirus COVID-19.  DVT prophylaxis:  Lovenox Code Status:  Full  Family Communication: None present  Disposition Plan:  Home once clinically improved Consults called: DM coordinator; [place] team  Admission status: Admit - It is my clinical opinion that admission to INPATIENT is reasonable and necessary because of the expectation that this patient will require hospital care that crosses at least 2 midnights to treat this condition based on the medical complexity of the problems presented.  Given the aforementioned information, the predictability of an adverse outcome is felt to be significant.    Karmen Bongo MD [facility]   How to contact the [facility] Attending or Consulting provider [place] or covering provider during after hours [place], for this patient?  1. Check the care team in [facility] and look for a) attending/consulting  TR[facility] provider listed and b) the [facility] team listed 2. Log into [email or web address] and use [place]'s universal password to access. If you do not have the password, please contact the hospital operator. 3. Locate the [facility] provider you are looking for under [facility] and page to a number that you can be directly reached. 4. If you still have difficulty reaching the provider, please page  the [facility] (Director on Call) for the Hospitalists listed on amion for assistance.   06/16/2019, 11:18 AM

## 2019-06-17 LAB — BASIC METABOLIC PANEL
Anion gap: 12 (ref 5–15)
BUN: 13 mg/dL (ref 6–20)
CO2: 23 mmol/L (ref 22–32)
Calcium: 9.3 mg/dL (ref 8.9–10.3)
Chloride: 102 mmol/L (ref 98–111)
Creatinine, Ser: 0.83 mg/dL (ref 0.44–1.00)
GFR calc Af Amer: >60 mL/min (ref >60)
GFR calc non Af Amer: >60 mL/min (ref >60)
Glucose, Bld: 120 mg/dL — ABNORMAL HIGH (ref 70–99)
Potassium: 3.4 mmol/L — ABNORMAL LOW (ref 3.5–5.1)
Sodium: 137 mmol/L (ref 135–145)

## 2019-06-17 LAB — MRSA PCR SCREENING: MRSA by PCR: NEGATIVE

## 2019-06-17 LAB — GLUCOSE, CAPILLARY
Glucose-Capillary: 122 mg/dL — ABNORMAL HIGH (ref 70–99)
Glucose-Capillary: 136 mg/dL — ABNORMAL HIGH (ref 70–99)
Glucose-Capillary: 144 mg/dL — ABNORMAL HIGH (ref 70–99)
Glucose-Capillary: 101 mg/dL — ABNORMAL HIGH (ref 70–99)

## 2019-06-17 LAB — BETA-HYDROXYBUTYRIC ACID: Beta-Hydroxybutyric Acid: 0.85 mmol/L — ABNORMAL HIGH (ref 0.05–0.27)

## 2019-06-17 LAB — CBG MONITORING, ED: Glucose-Capillary: 113 mg/dL — ABNORMAL HIGH (ref 70–99)

## 2019-06-17 MED ORDER — INSULIN ASPART 100 UNIT/ML ~~LOC~~ SOLN: 0.0000 [IU] | Freq: Every day | SUBCUTANEOUS | Status: DC

## 2019-06-17 MED ORDER — PANTOPRAZOLE SODIUM 40 MG PO TBEC: 40.0000 mg | DELAYED_RELEASE_TABLET | Freq: Two times a day (BID) | ORAL | Status: DC

## 2019-06-17 MED ORDER — PRENATAL MULTIVITAMIN CH
1.0000 | ORAL_TABLET | Freq: Every day | ORAL | Status: DC
  Administered 2019-06-17: 1 via ORAL
  Filled 2019-06-17 (×2): qty 1

## 2019-06-17 MED ORDER — PANTOPRAZOLE SODIUM 40 MG PO TBEC
40.0000 mg | DELAYED_RELEASE_TABLET | Freq: Every day | ORAL | Status: DC
  Administered 2019-06-17: 10:00:00 40 mg via ORAL
  Filled 2019-06-17: qty 1

## 2019-06-17 MED ORDER — INSULIN ASPART 100 UNIT/ML ~~LOC~~ SOLN
0.0000 [IU] | Freq: Three times a day (TID) | SUBCUTANEOUS | Status: DC
  Administered 2019-06-17 (×3): 1 [IU] via SUBCUTANEOUS

## 2019-06-17 MED ORDER — DOCUSATE SODIUM 100 MG PO CAPS: 100.0000 mg | ORAL_CAPSULE | Freq: Every day | ORAL | Status: DC | PRN

## 2019-06-17 NOTE — Progress Notes (Signed)
[facility] PROGRESS NOTE    Progress Note  Susan [name]  QTM:[id number] DOB: 04/25/1981 DOA: 06/15/2019 PCP: Cathlean Sauer, MD     Brief Narrative:   Susan [name] is an 39 y.o. female past medical history significant for alcohol dependence, GI bleed, pancreatitis presenting with nausea and vomiting.  She relates to she started with vomiting 2 days ago which has not gotten better and not able to take anything in.  Assessment/Plan:   Metabolic acidosis, increased anion gap/hyperglycemia Of 15 on admission she was started on IV fluids her gap this morning is 12. Question starvation/alcoholic acidosis in the setting of acute renal failure. She was started on IV insulin now has been transitioned off IV insulin. She has no history of diabetes mellitus, her A1c was 5.3.  Transferred to [facility]  Acute kidney injury: With a baseline creatinine of less than 1, on admission 1.5 likely prerenal azotemia resolved with IV fluid hydration.  Alcohol abuse She reports ongoing alcohol use, she has exhibited no signs of withdrawal, continue thiamine and folate. Monitor CIWA protocol.  Elevated blood pressure without a diagnosis of essential hypertension: On admission her blood pressure is elevated her blood pressure has been fluctuating but was less than 140/100. We will continue to monitor.   DVT prophylaxis: lovenox Family Communication:none Disposition Plan/Barrier to D/C: unable to determine Code Status:     Code Status Orders  (From admission, onward)         Start     Ordered   06/16/19 0933  Full code  Continuous     06/16/19 0933        Code Status History    Date Active Date Inactive Code Status Order ID Comments User Context   04/13/2019 0445 04/14/2019 1621 Full Code [id number]  Arrie Eastern, [place] Inpatient   04/13/2019 0213 04/13/2019 0421 Full Code [id number]  Arrie Eastern, [place] Inpatient   04/03/2019 1011 04/13/2019 0213 Full Code [id number]  Arrie Eastern, [place] Inpatient   04/02/2019 1130 04/03/2019 1011 Full Code [id number]  Donna Bernard, RN Inpatient   01/16/2019 1516 01/18/2019 2227 Full Code [id number]  Danielle Rankin Inpatient   05/04/2018 1822 05/07/2018 1328 Full Code [id number]  Thurnell Lose, MD ED   03/24/2018 1331 03/25/2018 1926 Full Code [id number]  Elodia Florence., MD Inpatient   03/22/2018 0514 03/24/2018 1331 Full Code [id number]  Montine Circle, PA-C ED   03/31/2017 0102 04/02/2017 1819 Full Code [id number]  Ivor Costa, MD ED   03/30/2017 2127 03/31/2017 0102 Full Code [id number]  Lorin Glass, PA-C ED   09/05/2016 0305 09/09/2016 1644 Full Code [id number]  Norval Morton, MD ED   09/04/2016 2330 09/05/2016 0305 Full Code [id number]  Margarita Mail, PA-C ED   06/04/2016 1718 06/05/2016 1804 Full Code [id number]  Kerney Elbe, DO Inpatient   Advance Care Planning Activity        IV Access:    Peripheral IV   Procedures and diagnostic studies:   DG Chest 2 View  Result Date: 06/15/2019 CLINICAL DATA:  Chest tightness. EXAM: CHEST - 2 VIEW COMPARISON:  March 22, 2018 FINDINGS: The heart size and mediastinal contours are within normal limits. Both lungs are clear. The visualized skeletal structures are unremarkable. IMPRESSION: No active cardiopulmonary disease. Electronically Signed   By: Virgina Norfolk M.D.   On: 06/15/2019 21:38     Medical Consultants:    None.  Anti-Infectives:   None  Subjective:  Susan [name] she relates she feels better no new complaints.  Objective:    Vitals:   06/17/19 0200 06/17/19 0300 06/17/19 0405 06/17/19 0409  BP: (!) 129/95 (!) 132/100  (!) 138/106  Pulse: 76 85  (!) 111  Resp: 16 17  19   Temp:    98.5 F (36.9 C)  TempSrc:    Oral  SpO2: 96% 98%  98%  Weight:   65.5 kg   Height:   5' 2"  (1.575 m)    SpO2: 98 %   Intake/Output Summary (Last 24 hours) at 06/17/2019 0740 Last data filed at 06/16/2019 1144 Gross per 24  hour  Intake 2204.5 ml  Output -  Net 2204.5 ml   Filed Weights   06/17/19 0405  Weight: 65.5 kg    Exam: General exam: In no acute distress. Respiratory system: Good air movement and clear to auscultation. Cardiovascular system: S1 & S2 heard, RRR. No JVD, murmurs, rubs, gallops or clicks.  Gastrointestinal system: Abdomen is nondistended, soft and nontender.  Central nervous system: Alert and oriented. No focal neurological deficits. Extremities: No pedal edema. Skin: No rashes, lesions or ulcers Psychiatry: Judgement and insight appear normal. Mood & affect appropriate.    Data Reviewed:    Labs: Basic Metabolic Panel: Recent Labs  Lab 06/16/19 0124 06/16/19 1001 06/16/19 1545 06/16/19 1733 06/16/19 2133 06/17/19 0429  NA  --  141 138 136 135 137  K  --  4.5 4.4 4.1 3.1* 3.4*  CL  --  109 105 104 101 102  CO2  --  16* 18* 20* 22 23  GLUCOSE  --  143* 131* 114* 141* 120*  BUN  --  34* 28* 23* 18 13  CREATININE  --  1.57* 1.27* 1.03* 0.91 0.83  CALCIUM  --  9.3 9.3 9.2 9.1 9.3  MG 2.5*  --   --   --   --   --   PHOS  --  2.0*  --   --   --   --    GFR Estimated Creatinine Clearance: 81.7 mL/min (by C-G formula based on SCr of 0.83 mg/dL). Liver Function Tests: Recent Labs  Lab 06/16/19 0634  AST 303*  ALT 228*  ALKPHOS 137*  BILITOT 1.5*  PROT 9.5*  ALBUMIN 5.1*   Recent Labs  Lab 06/16/19 0634  LIPASE 159*   No results for input(s): AMMONIA in the last 168 hours. Coagulation profile No results for input(s): INR, PROTIME in the last 168 hours. COVID-19 Labs  No results for input(s): DDIMER, FERRITIN, LDH, CRP in the last 72 hours.  Lab Results  Component Value Date   SARSCOV2NAA NEGATIVE 06/16/2019   [place] NEGATIVE 04/02/2019   [place] NEGATIVE 01/16/2019   [place] Not Detected 01/08/2019    CBC: Recent Labs  Lab 06/15/19 1720 06/16/19 0642  WBC 12.9*  --   HGB 12.6 15.0  HCT 42.5 44.0  MCV 97.9  --   PLT 383  --     Cardiac Enzymes: No results for input(s): CKTOTAL, CKMB, CKMBINDEX, TROPONINI in the last 168 hours. BNP (last 3 results) No results for input(s): PROBNP in the last 8760 hours. CBG: Recent Labs  Lab 06/16/19 1500 06/16/19 1645 06/16/19 2233 06/17/19 0110 06/17/19 0607  GLUCAP 151* 111* 112* 113* 122*   D-Dimer: No results for input(s): DDIMER in the last 72 hours. Hgb A1c: Recent Labs    06/16/19 1005  HGBA1C 5.3   Lipid Profile: No results for  input(s): CHOL, HDL, LDLCALC, TRIG, CHOLHDL, LDLDIRECT in the last 72 hours. Thyroid function studies: No results for input(s): TSH, T4TOTAL, T3FREE, THYROIDAB in the last 72 hours.  Invalid input(s): FREET3 Anemia work up: No results for input(s): VITAMINB12, FOLATE, FERRITIN, TIBC, IRON, RETICCTPCT in the last 72 hours. Sepsis Labs: Recent Labs  Lab 06/15/19 1720  WBC 12.9*   Microbiology Recent Results (from the past 240 hour(s))  SARS CORONAVIRUS 2 (TAT 6-24 HRS) Nasopharyngeal Nasopharyngeal Swab     Status: None   Collection Time: 06/16/19  6:10 AM   Specimen: Nasopharyngeal Swab  Result Value Ref Range Status   SARS Coronavirus 2 NEGATIVE NEGATIVE Final    Comment: (NOTE) SARS-CoV-2 target nucleic acids are NOT DETECTED. The SARS-CoV-2 RNA is generally detectable in upper and lower respiratory specimens during the acute phase of infection. Negative results do not preclude SARS-CoV-2 infection, do not rule out co-infections with other pathogens, and should not be used as the sole basis for treatment or other patient management decisions. Negative results must be combined with clinical observations, patient history, and epidemiological information. The expected result is Negative. Fact Sheet for Patients: SugarRoll.be Fact Sheet for Healthcare Providers: https://www.woods-mathews.com/ This test is not yet approved or cleared by the [place] FDA and  has been  authorized for detection and/or diagnosis of SARS-CoV-2 by FDA under an Emergency Use Authorization (EUA). This EUA will remain  in effect (meaning this test can be used) for the duration of the COVID-19 declaration under Section 56 4(b)(1) of the Act, 21 U.S.C. section 360bbb-3(b)(1), unless the authorization is terminated or revoked sooner. Performed at [facility], [place] [address], [place], [place] [address]      Medications:   . enoxaparin (LOVENOX) injection  40 mg Subcutaneous Q24H  . folic acid  1 mg Oral Daily  . insulin aspart  0-5 Units Subcutaneous QHS  . insulin aspart  0-9 Units Subcutaneous TID WC  . multivitamin with minerals  1 tablet Oral Daily  . thiamine  100 mg Oral Daily   Or  . thiamine  100 mg Intravenous Daily   Continuous Infusions: . sodium chloride    . dextrose 5 % and 0.45% NaCl 125 mL/hr at 06/17/19 0421     LOS: 1 day   Charlynne Cousins  [facility]  06/17/2019, 7:40 AM

## 2019-06-18 DIAGNOSIS — F1010 Alcohol abuse, uncomplicated: Secondary | ICD-10-CM

## 2019-06-18 DIAGNOSIS — N179 Acute kidney failure, unspecified: Principal | ICD-10-CM

## 2019-06-18 LAB — GLUCOSE, CAPILLARY
Glucose-Capillary: 110 mg/dL — ABNORMAL HIGH (ref 70–99)
Glucose-Capillary: 139 mg/dL — ABNORMAL HIGH (ref 70–99)

## 2019-06-18 NOTE — Progress Notes (Signed)
Discharge instructions given to patient, IV removed and intact. Site is clean dry and intact.

## 2019-06-18 NOTE — Discharge Summary (Signed)
Physician Discharge Summary  CATALEIA GADE OLM:[id number] DOB: [birth year] DOA: 06/15/2019  PCP: Cathlean Sauer, MD  Admit date: 06/15/2019 Discharge date: 06/18/2019  Admitted From: home Disposition:  Home  Recommendations for Outpatient Follow-up:  1. Follow up with PCP in 1-2 weeks   Home Health:No Equipment/Devices:None  Discharge Condition:Stable CODE STATUS:Full Diet recommendation: Heart Healthy  Brief/Interim Summary: 39 y.o. female past medical history significant for alcohol dependence, GI bleed, pancreatitis presenting with nausea and vomiting.  She relates to she started with vomiting 2 days ago which has not gotten better and not able to take anything in.  Discharge Diagnoses:  Principal Problem:   Metabolic acidosis, increased anion gap Active Problems:   Alcohol abuse   AKI (acute kidney injury) ([place])   Alcoholic hepatitis without ascites   Chronic hypertension   SVD (11/3) High anion gap metabolic acidosis/hyperglycemia: Question starvation/alcoholic ketosis in the setting of acute renal failure, on admission she was put on IV insulin IV fluids her anion gap closed her bicarb increased improve greater than 20. The insulin was DC'd her A1c was 5.3. She tolerated her diet and her blood glucose remain greatly controlled, she relates she binge alcohol 24 to 48 hours prior to admission which is probably the etiology.  Acute kidney injury: With a baseline creatinine of less than 1, on admission 1.5 likely prerenal azotemia resolved with IV fluid hydration.  Alcohol abuse: She reports only recent alcohol binging, she exhibited no signs of withdrawal, on admission she was placed on thiamine and folate monitor with CIWA protocol, she exhibited no signs of withdrawal.  Elevated blood pressure without a diagnosis of essential hypertension: She was monitored closely her blood pressure improves slowly consistently, she will follow-up with PCP as an outpatient and check  blood pressure regularly.  Discharge Instructions  Discharge Instructions    Diet - low sodium heart healthy   Complete by: As directed    Increase activity slowly   Complete by: As directed      Allergies as of 06/18/2019      Reactions   Effexor [venlafaxine] Palpitations, Other (See Comments)   Panic attacks   Latex Rash   Tape irritates the skin/ please use paper tape   Tape Rash   pls use paper tape      Medication List    TAKE these medications   docusate sodium 100 MG capsule Commonly known as: COLACE Take 100 mg by mouth daily as needed for mild constipation.   prenatal multivitamin Tabs tablet Take 1 tablet by mouth daily.       Allergies  Allergen Reactions  . Effexor [Venlafaxine] Palpitations and Other (See Comments)    Panic attacks  . Latex Rash    Tape irritates the skin/ please use paper tape  . Tape Rash    pls use paper tape    Consultations:  None   Procedures/Studies: DG Chest 2 View  Result Date: 06/15/2019 CLINICAL DATA:  Chest tightness. EXAM: CHEST - 2 VIEW COMPARISON:  March 22, 2018 FINDINGS: The heart size and mediastinal contours are within normal limits. Both lungs are clear. The visualized skeletal structures are unremarkable. IMPRESSION: No active cardiopulmonary disease. Electronically Signed   By: Virgina Norfolk M.D.   On: 06/15/2019 21:38    None   Subjective: Good mood today happy to be going home.  Discharge Exam: Vitals:   06/18/19 0522 06/18/19 0525  BP: 118/88 118/88  Pulse: 88 81  Resp: 18   Temp: 98 F (  36.7 C)   SpO2: 98% 99%   Vitals:   06/17/19 1700 06/17/19 1935 06/18/19 0522 06/18/19 0525  BP: (!) 128/91 (!) 135/99 118/88 118/88  Pulse: (!) 107 (!) 105 88 81  Resp: 17 (!) 21 18   Temp: 98.3 F (36.8 C) 98.9 F (37.2 C) 98 F (36.7 C)   TempSrc: Oral Oral Oral   SpO2: 98% 99% 98% 99%  Weight:      Height:        General: Pt is alert, awake, not in acute distress Cardiovascular: RRR,  S1/S2 +, no rubs, no gallops Respiratory: CTA bilaterally, no wheezing, no rhonchi Abdominal: Soft, NT, ND, bowel sounds + Extremities: no edema, no cyanosis    The results of significant diagnostics from this hospitalization (including imaging, microbiology, ancillary and laboratory) are listed below for reference.     Microbiology: Recent Results (from the past 240 hour(s))  SARS CORONAVIRUS 2 (TAT 6-24 HRS) Nasopharyngeal Nasopharyngeal Swab     Status: None   Collection Time: 06/16/19  6:10 AM   Specimen: Nasopharyngeal Swab  Result Value Ref Range Status   SARS Coronavirus 2 NEGATIVE NEGATIVE Final    Comment: (NOTE) SARS-CoV-2 target nucleic acids are NOT DETECTED. The SARS-CoV-2 RNA is generally detectable in upper and lower respiratory specimens during the acute phase of infection. Negative results do not preclude SARS-CoV-2 infection, do not rule out co-infections with other pathogens, and should not be used as the sole basis for treatment or other patient management decisions. Negative results must be combined with clinical observations, patient history, and epidemiological information. The expected result is Negative. Fact Sheet for Patients: SugarRoll.be Fact Sheet for Healthcare Providers: https://www.woods-mathews.com/ This test is not yet approved or cleared by the [place] FDA and  has been authorized for detection and/or diagnosis of SARS-CoV-2 by FDA under an Emergency Use Authorization (EUA). This EUA will remain  in effect (meaning this test can be used) for the duration of the COVID-19 declaration under Section 56 4(b)(1) of the Act, 21 U.S.C. section 360bbb-3(b)(1), unless the authorization is terminated or revoked sooner. Performed at [facility], [place] [address], [place], [place] [address]   MRSA PCR Screening     Status: None   Collection Time: 06/17/19  4:57 AM   Specimen: Nasal Mucosa;  Nasopharyngeal  Result Value Ref Range Status   MRSA by PCR NEGATIVE NEGATIVE Final    Comment:        The GeneXpert MRSA Assay (FDA approved for NASAL specimens only), is one component of a comprehensive MRSA colonization surveillance program. It is not intended to diagnose MRSA infection nor to guide or monitor treatment for MRSA infections. Performed at [facility], [place] [address], [place], [place] [address]      Labs: BNP (last 3 results) No results for input(s): BNP in the last 8760 hours. Basic Metabolic Panel: Recent Labs  Lab 06/16/19 0124 06/16/19 1001 06/16/19 1545 06/16/19 1733 06/16/19 2133 06/17/19 0429  NA  --  141 138 136 135 137  K  --  4.5 4.4 4.1 3.1* 3.4*  CL  --  109 105 104 101 102  CO2  --  16* 18* 20* 22 23  GLUCOSE  --  143* 131* 114* 141* 120*  BUN  --  34* 28* 23* 18 13  CREATININE  --  1.57* 1.27* 1.03* 0.91 0.83  CALCIUM  --  9.3 9.3 9.2 9.1 9.3  MG 2.5*  --   --   --   --   --  PHOS  --  2.0*  --   --   --   --    Liver Function Tests: Recent Labs  Lab 06/16/19 0634  AST 303*  ALT 228*  ALKPHOS 137*  BILITOT 1.5*  PROT 9.5*  ALBUMIN 5.1*   Recent Labs  Lab 06/16/19 0634  LIPASE 159*   No results for input(s): AMMONIA in the last 168 hours. CBC: Recent Labs  Lab 06/15/19 1720 06/16/19 0642  WBC 12.9*  --   HGB 12.6 15.0  HCT 42.5 44.0  MCV 97.9  --   PLT 383  --    Cardiac Enzymes: No results for input(s): CKTOTAL, CKMB, CKMBINDEX, TROPONINI in the last 168 hours. BNP: Invalid input(s): POCBNP CBG: Recent Labs  Lab 06/17/19 0607 06/17/19 1113 06/17/19 1623 06/17/19 2126 06/18/19 0614  GLUCAP 122* 136* 144* 101* 110*   D-Dimer No results for input(s): DDIMER in the last 72 hours. Hgb A1c Recent Labs    06/16/19 1005  HGBA1C 5.3   Lipid Profile No results for input(s): CHOL, HDL, LDLCALC, TRIG, CHOLHDL, LDLDIRECT in the last 72 hours. Thyroid function studies No results for input(s): TSH,  T4TOTAL, T3FREE, THYROIDAB in the last 72 hours.  Invalid input(s): FREET3 Anemia work up No results for input(s): VITAMINB12, FOLATE, FERRITIN, TIBC, IRON, RETICCTPCT in the last 72 hours. Urinalysis    Component Value Date/Time   COLORURINE YELLOW 06/15/2019 1945   APPEARANCEUR HAZY (A) 06/15/2019 1945   LABSPEC 1.018 06/15/2019 1945   PHURINE 5.0 06/15/2019 1945   GLUCOSEU NEGATIVE 06/15/2019 1945   HGBUR MODERATE (A) 06/15/2019 1945   BILIRUBINUR SMALL (A) 06/15/2019 1945   KETONESUR 80 (A) 06/15/2019 1945   PROTEINUR 100 (A) 06/15/2019 1945   NITRITE NEGATIVE 06/15/2019 1945   LEUKOCYTESUR NEGATIVE 06/15/2019 1945   Sepsis Labs Invalid input(s): PROCALCITONIN,  WBC,  LACTICIDVEN Microbiology Recent Results (from the past 240 hour(s))  SARS CORONAVIRUS 2 (TAT 6-24 HRS) Nasopharyngeal Nasopharyngeal Swab     Status: None   Collection Time: 06/16/19  6:10 AM   Specimen: Nasopharyngeal Swab  Result Value Ref Range Status   SARS Coronavirus 2 NEGATIVE NEGATIVE Final    Comment: (NOTE) SARS-CoV-2 target nucleic acids are NOT DETECTED. The SARS-CoV-2 RNA is generally detectable in upper and lower respiratory specimens during the acute phase of infection. Negative results do not preclude SARS-CoV-2 infection, do not rule out co-infections with other pathogens, and should not be used as the sole basis for treatment or other patient management decisions. Negative results must be combined with clinical observations, patient history, and epidemiological information. The expected result is Negative. Fact Sheet for Patients: SugarRoll.be Fact Sheet for Healthcare Providers: https://www.woods-mathews.com/ This test is not yet approved or cleared by the [place] FDA and  has been authorized for detection and/or diagnosis of SARS-CoV-2 by FDA under an Emergency Use Authorization (EUA). This EUA will remain  in effect (meaning this test  can be used) for the duration of the COVID-19 declaration under Section 56 4(b)(1) of the Act, 21 U.S.C. section 360bbb-3(b)(1), unless the authorization is terminated or revoked sooner. Performed at [facility], [place] [address] [place], [place] [address]   MRSA PCR Screening     Status: None   Collection Time: 06/17/19  4:57 AM   Specimen: Nasal Mucosa; Nasopharyngeal  Result Value Ref Range Status   MRSA by PCR NEGATIVE NEGATIVE Final    Comment:        The GeneXpert MRSA Assay (FDA  approved for NASAL specimens only), is one component of a comprehensive MRSA colonization surveillance program. It is not intended to diagnose MRSA infection nor to guide or monitor treatment for MRSA infections. Performed at [facility], [place] [address] [place], [place] [address]      Time coordinating discharge: Over 40 minutes  SIGNED:   Charlynne Cousins, MD  [facility] 06/18/2019, 7:35 AM Pager   If 7PM-7AM, please contact night-coverage [email or web address] Password [email or web address]

## 2019-07-15 ENCOUNTER — Other Ambulatory Visit: Payer: Self-pay | Admitting: Obstetrics & Gynecology

## 2019-07-15 DIAGNOSIS — N920 Excessive and frequent menstruation with regular cycle: Secondary | ICD-10-CM

## 2019-07-21 ENCOUNTER — Other Ambulatory Visit: Payer: Self-pay | Admitting: Obstetrics & Gynecology

## 2019-07-26 ENCOUNTER — Ambulatory Visit
Admission: RE | Admit: 2019-07-26 | Discharge: 2019-07-26 | Disposition: A | Discharge status: Home / Self Care | Payer: Medicaid Other | Source: Ambulatory Visit | Attending: Obstetrics & Gynecology | Admitting: Obstetrics & Gynecology

## 2019-07-26 DIAGNOSIS — N920 Excessive and frequent menstruation with regular cycle: Secondary | ICD-10-CM

## 2019-08-03 ENCOUNTER — Other Ambulatory Visit: Payer: Self-pay | Admitting: Obstetrics & Gynecology

## 2019-08-16 ENCOUNTER — Other Ambulatory Visit: Payer: Self-pay

## 2019-08-16 ENCOUNTER — Encounter (HOSPITAL_BASED_OUTPATIENT_CLINIC_OR_DEPARTMENT_OTHER): Payer: Self-pay | Admitting: Obstetrics & Gynecology

## 2019-08-19 ENCOUNTER — Other Ambulatory Visit (HOSPITAL_COMMUNITY)
Admission: RE | Admit: 2019-08-19 | Discharge: 2019-08-19 | Disposition: A | Discharge status: Home / Self Care | Payer: Medicaid Other | Source: Ambulatory Visit | Attending: Obstetrics & Gynecology | Admitting: Obstetrics & Gynecology

## 2019-08-19 ENCOUNTER — Other Ambulatory Visit: Payer: Self-pay

## 2019-08-19 ENCOUNTER — Encounter (HOSPITAL_BASED_OUTPATIENT_CLINIC_OR_DEPARTMENT_OTHER)
Admission: RE | Admit: 2019-08-19 | Discharge: 2019-08-19 | Disposition: A | Discharge status: Home / Self Care | Payer: Medicaid Other | Source: Ambulatory Visit | Attending: Obstetrics & Gynecology | Admitting: Obstetrics & Gynecology

## 2019-08-19 DIAGNOSIS — Z20822 Contact with and (suspected) exposure to covid-19: Secondary | ICD-10-CM | POA: Diagnosis not present

## 2019-08-19 DIAGNOSIS — Z01812 Encounter for preprocedural laboratory examination: Secondary | ICD-10-CM | POA: Insufficient documentation

## 2019-08-19 LAB — COMPREHENSIVE METABOLIC PANEL
ALT: 93 U/L — ABNORMAL HIGH (ref 0–44)
AST: 149 U/L — ABNORMAL HIGH (ref 15–41)
Albumin: 4.3 g/dL (ref 3.5–5.0)
Alkaline Phosphatase: 75 U/L (ref 38–126)
Anion gap: 17 — ABNORMAL HIGH (ref 5–15)
BUN: 11 mg/dL (ref 6–20)
CO2: 18 mmol/L — ABNORMAL LOW (ref 22–32)
Calcium: 9.2 mg/dL (ref 8.9–10.3)
Chloride: 104 mmol/L (ref 98–111)
Creatinine, Ser: 1.02 mg/dL — ABNORMAL HIGH (ref 0.44–1.00)
GFR calc Af Amer: >60 mL/min (ref >60)
GFR calc non Af Amer: >60 mL/min (ref >60)
Glucose, Bld: 119 mg/dL — ABNORMAL HIGH (ref 70–99)
Potassium: 4.3 mmol/L (ref 3.5–5.1)
Sodium: 139 mmol/L (ref 135–145)
Total Bilirubin: 1.1 mg/dL (ref 0.3–1.2)
Total Protein: 8.1 g/dL (ref 6.5–8.1)

## 2019-08-19 LAB — CBC
HCT: 37.8 % (ref 36.0–46.0)
Hemoglobin: 12 g/dL (ref 12.0–15.0)
MCH: 29.7 pg (ref 26.0–34.0)
MCHC: 31.7 g/dL (ref 30.0–36.0)
MCV: 93.6 fL (ref 80.0–100.0)
Platelets: 333 10*3/uL (ref 150–400)
RBC: 4.04 MIL/uL (ref 3.87–5.11)
RDW: 17 % — ABNORMAL HIGH (ref 11.5–15.5)
WBC: 4.5 10*3/uL (ref 4.0–10.5)
nRBC: 0 % (ref 0.0–0.2)

## 2019-08-19 LAB — POCT PREGNANCY, URINE: Preg Test, Ur: NEGATIVE

## 2019-08-20 DIAGNOSIS — Z01812 Encounter for preprocedural laboratory examination: Secondary | ICD-10-CM | POA: Diagnosis not present

## 2019-08-20 LAB — SARS CORONAVIRUS 2 (TAT 6-24 HRS): SARS Coronavirus 2: NEGATIVE

## 2019-08-23 ENCOUNTER — Ambulatory Visit (HOSPITAL_BASED_OUTPATIENT_CLINIC_OR_DEPARTMENT_OTHER): Payer: Medicaid Other | Admitting: Anesthesiology

## 2019-08-23 ENCOUNTER — Encounter (HOSPITAL_BASED_OUTPATIENT_CLINIC_OR_DEPARTMENT_OTHER)
Admission: RE | Disposition: A | Discharge status: Home / Self Care | Payer: Self-pay | Source: Ambulatory Visit | Attending: Obstetrics & Gynecology

## 2019-08-23 ENCOUNTER — Ambulatory Visit (HOSPITAL_BASED_OUTPATIENT_CLINIC_OR_DEPARTMENT_OTHER)
Admission: RE | Admit: 2019-08-23 | Discharge: 2019-08-23 | Disposition: A | Discharge status: Home / Self Care | Payer: Medicaid Other | Source: Ambulatory Visit | Attending: Obstetrics & Gynecology | Admitting: Obstetrics & Gynecology

## 2019-08-23 ENCOUNTER — Encounter (HOSPITAL_BASED_OUTPATIENT_CLINIC_OR_DEPARTMENT_OTHER): Payer: Self-pay | Admitting: Obstetrics & Gynecology

## 2019-08-23 DIAGNOSIS — Z87891 Personal history of nicotine dependence: Secondary | ICD-10-CM | POA: Insufficient documentation

## 2019-08-23 DIAGNOSIS — N921 Excessive and frequent menstruation with irregular cycle: Secondary | ICD-10-CM | POA: Insufficient documentation

## 2019-08-23 DIAGNOSIS — N939 Abnormal uterine and vaginal bleeding, unspecified: Secondary | ICD-10-CM | POA: Diagnosis not present

## 2019-08-23 DIAGNOSIS — N840 Polyp of corpus uteri: Secondary | ICD-10-CM | POA: Insufficient documentation

## 2019-08-23 DIAGNOSIS — I10 Essential (primary) hypertension: Secondary | ICD-10-CM | POA: Diagnosis not present

## 2019-08-23 HISTORY — DX: Nausea with vomiting, unspecified: R11.2

## 2019-08-23 HISTORY — DX: Other specified postprocedural states: Z98.890

## 2019-08-23 HISTORY — PX: DILATATION AND CURETTAGE/HYSTEROSCOPY WITH MINERVA: SHX6851

## 2019-08-23 HISTORY — DX: Alcoholic hepatitis without ascites: K70.10

## 2019-08-23 HISTORY — DX: Essential (primary) hypertension: I10

## 2019-08-23 SURGERY — DILATATION AND CURETTAGE/HYSTEROSCOPY WITH MINERVA
Anesthesia: General | Site: Vagina

## 2019-08-23 MED ORDER — OXYCODONE HCL 5 MG PO TABS
5.0000 mg | ORAL_TABLET | Freq: Once | ORAL | Status: AC | PRN
  Administered 2019-08-23: 5 mg via ORAL

## 2019-08-23 MED ORDER — FENTANYL CITRATE (PF) 100 MCG/2ML IJ SOLN
INTRAMUSCULAR | Status: AC
  Filled 2019-08-23: qty 2

## 2019-08-23 MED ORDER — MIDAZOLAM HCL 2 MG/2ML IJ SOLN
INTRAMUSCULAR | Status: AC
  Filled 2019-08-23: qty 2

## 2019-08-23 MED ORDER — SCOPOLAMINE 1 MG/3DAYS TD PT72
MEDICATED_PATCH | TRANSDERMAL | Status: AC
  Filled 2019-08-23: qty 1

## 2019-08-23 MED ORDER — PROPOFOL 10 MG/ML IV BOLUS
INTRAVENOUS | Status: DC | PRN
  Administered 2019-08-23: 200 mg via INTRAVENOUS

## 2019-08-23 MED ORDER — KETOROLAC TROMETHAMINE 30 MG/ML IJ SOLN
INTRAMUSCULAR | Status: DC | PRN
  Administered 2019-08-23: 30 mg via INTRAVENOUS

## 2019-08-23 MED ORDER — FENTANYL CITRATE (PF) 100 MCG/2ML IJ SOLN: 25.0000 ug | INTRAMUSCULAR | Status: DC | PRN

## 2019-08-23 MED ORDER — ONDANSETRON HCL 4 MG/2ML IJ SOLN
INTRAMUSCULAR | Status: AC
  Filled 2019-08-23: qty 2

## 2019-08-23 MED ORDER — LACTATED RINGERS IV SOLN: INTRAVENOUS | Status: DC

## 2019-08-23 MED ORDER — KETOROLAC TROMETHAMINE 30 MG/ML IJ SOLN
INTRAMUSCULAR | Status: AC
  Filled 2019-08-23: qty 1

## 2019-08-23 MED ORDER — LIDOCAINE-EPINEPHRINE 1 %-1:100000 IJ SOLN
INTRAMUSCULAR | Status: DC | PRN
  Administered 2019-08-23: 10 mL

## 2019-08-23 MED ORDER — ONDANSETRON HCL 4 MG/2ML IJ SOLN
INTRAMUSCULAR | Status: DC | PRN
  Administered 2019-08-23: 4 mg via INTRAVENOUS

## 2019-08-23 MED ORDER — OXYCODONE HCL 5 MG PO TABS
ORAL_TABLET | ORAL | Status: AC
  Filled 2019-08-23: qty 1

## 2019-08-23 MED ORDER — DEXAMETHASONE SODIUM PHOSPHATE 10 MG/ML IJ SOLN
INTRAMUSCULAR | Status: DC | PRN
  Administered 2019-08-23: 10 mg via INTRAVENOUS

## 2019-08-23 MED ORDER — SCOPOLAMINE 1 MG/3DAYS TD PT72
1.0000 | MEDICATED_PATCH | TRANSDERMAL | Status: DC
  Administered 2019-08-23: 13:00:00 1.5 mg via TRANSDERMAL

## 2019-08-23 MED ORDER — LIDOCAINE HCL (CARDIAC) PF 100 MG/5ML IV SOSY
PREFILLED_SYRINGE | INTRAVENOUS | Status: DC | PRN
  Administered 2019-08-23: 60 mg via INTRAVENOUS

## 2019-08-23 MED ORDER — ONDANSETRON HCL 4 MG/2ML IJ SOLN
4.0000 mg | Freq: Once | INTRAMUSCULAR | Status: AC | PRN
  Administered 2019-08-23: 4 mg via INTRAVENOUS

## 2019-08-23 MED ORDER — LIDOCAINE 2% (20 MG/ML) 5 ML SYRINGE
INTRAMUSCULAR | Status: AC
  Filled 2019-08-23: qty 5

## 2019-08-23 MED ORDER — ONDANSETRON HCL 4 MG PO TABS: 4.0000 mg | ORAL_TABLET | Freq: Three times a day (TID) | ORAL | 1 refills | Status: DC | PRN

## 2019-08-23 MED ORDER — FENTANYL CITRATE (PF) 100 MCG/2ML IJ SOLN
INTRAMUSCULAR | Status: DC | PRN
  Administered 2019-08-23 (×4): 50 ug via INTRAVENOUS

## 2019-08-23 MED ORDER — IBUPROFEN 800 MG PO TABS: 800.0000 mg | ORAL_TABLET | Freq: Three times a day (TID) | ORAL | 0 refills | Status: DC | PRN

## 2019-08-23 MED ORDER — MIDAZOLAM HCL 2 MG/2ML IJ SOLN
INTRAMUSCULAR | Status: DC | PRN
  Administered 2019-08-23: 2 mg via INTRAVENOUS

## 2019-08-23 MED ORDER — DEXAMETHASONE SODIUM PHOSPHATE 10 MG/ML IJ SOLN
INTRAMUSCULAR | Status: AC
  Filled 2019-08-23: qty 1

## 2019-08-23 MED ORDER — SODIUM CHLORIDE 0.9 % IR SOLN
Status: DC | PRN
  Administered 2019-08-23: 3000 mL

## 2019-08-23 MED ORDER — OXYCODONE HCL 5 MG/5ML PO SOLN: 5.0000 mg | Freq: Once | ORAL | Status: AC | PRN

## 2019-08-23 MED ORDER — SILVER NITRATE-POT NITRATE 75-25 % EX MISC
CUTANEOUS | Status: DC | PRN
  Administered 2019-08-23: 2

## 2019-08-23 SURGICAL SUPPLY — 19 items
DILATOR CANAL MILEX (MISCELLANEOUS) IMPLANT
ELECT REM PT RETURN 9FT ADLT (ELECTROSURGICAL)
ELECTRODE REM PT RTRN 9FT ADLT (ELECTROSURGICAL) IMPLANT
GAUZE 4X4 16PLY RFD (DISPOSABLE) ×3 IMPLANT
GLOVE BIOGEL PI IND STRL 7.0 (GLOVE) ×1 IMPLANT
GLOVE BIOGEL PI INDICATOR 7.0 (GLOVE) ×2
GLOVE ECLIPSE 7.5 STRL STRAW (GLOVE) ×6 IMPLANT
GOWN STRL REUS W/ TWL XL LVL3 (GOWN DISPOSABLE) ×1 IMPLANT
GOWN STRL REUS W/TWL LRG LVL3 (GOWN DISPOSABLE) ×3 IMPLANT
GOWN STRL REUS W/TWL XL LVL3 (GOWN DISPOSABLE) ×3
HANDPIECE ABLA MINERVA ENDO (MISCELLANEOUS) ×3 IMPLANT
KIT PROCEDURE FLUENT (KITS) IMPLANT
NEEDLE SPNL 18GX3.5 QUINCKE PK (NEEDLE) IMPLANT
PACK VAGINAL MINOR WOMEN LF (CUSTOM PROCEDURE TRAY) ×3 IMPLANT
PAD OB MATERNITY 4.3X12.25 (PERSONAL CARE ITEMS) ×3 IMPLANT
PAD PREP 24X48 CUFFED NSTRL (MISCELLANEOUS) ×3 IMPLANT
SEAL ROD LENS SCOPE MYOSURE (ABLATOR) IMPLANT
SLEEVE SCD COMPRESS KNEE MED (MISCELLANEOUS) ×3 IMPLANT
TOWEL GREEN STERILE FF (TOWEL DISPOSABLE) ×6 IMPLANT

## 2019-08-23 NOTE — Discharge Instructions (Signed)
Susan [name], 1. Nothing in vagina x 2 weeks.  2. Expect some vaginal bleeding for next several days, call me if with excessive bleeding requiring you to change a pad every hour.  3.  Expect some watery/white/brown vaginal discharge for several weeks. Let me know if with strong unpleasant vaginal odor.   3.  Expect some abdominal cramping, take pain medication as needed.  Call me if pain is intolerable despite medication use. 4. Please keep your upcoming office appointment as scheduled.  Sincecerely, Dr. Alesia [name].    Post Anesthesia Home Care Instructions  Activity: Get plenty of rest for the remainder of the day. A responsible individual must stay with you for 24 hours following the procedure.  For the next 24 hours, DO NOT: -Drive a car -Paediatric nurse -Drink alcoholic beverages -Take any medication unless instructed by your physician -Make any legal decisions or sign important papers.  Meals: Start with liquid foods such as gelatin or soup. Progress to regular foods as tolerated. Avoid greasy, spicy, heavy foods. If nausea and/or vomiting occur, drink only clear liquids until the nausea and/or vomiting subsides. Call your physician if vomiting continues.  Special Instructions/Symptoms: Your throat may feel dry or sore from the anesthesia or the breathing tube placed in your throat during surgery. If this causes discomfort, gargle with warm salt water. The discomfort should disappear within 24 hours.  If you had a scopolamine patch placed behind your ear for the management of post- operative nausea and/or vomiting:  1. The medication in the patch is effective for 72 hours, after which it should be removed.  Wrap patch in a tissue and discard in the trash. Wash hands thoroughly with soap and water. 2. You may remove the patch earlier than 72 hours if you experience unpleasant side effects which may include dry mouth, dizziness or visual disturbances. 3. Avoid touching the patch. Wash  your hands with soap and water after contact with the patch.

## 2019-08-23 NOTE — Anesthesia Procedure Notes (Signed)
Procedure Name: LMA Insertion Date/Time: 08/23/2019 1:50 PM Performed by: Genelle Bal, CRNA Pre-anesthesia Checklist: Patient identified, Emergency Drugs available, Suction available and Patient being monitored Patient Re-evaluated:Patient Re-evaluated prior to induction Oxygen Delivery Method: Circle system utilized Preoxygenation: Pre-oxygenation with 100% oxygen Induction Type: IV induction Ventilation: Mask ventilation without difficulty LMA: LMA inserted LMA Size: 4.0 Number of attempts: 1 Airway Equipment and Method: Bite block Placement Confirmation: positive ETCO2 Tube secured with: Tape Dental Injury: Teeth and Oropharynx as per pre-operative assessment

## 2019-08-23 NOTE — Transfer of Care (Signed)
Immediate Anesthesia Transfer of Care Note  Patient: Susan [name]  Procedure(s) Performed: DILATATION AND CURETTAGE/HYSTEROSCOPY WITH MINERVA (N/A Vagina )  Patient Location: PACU  Anesthesia Type:General  Level of Consciousness: awake, alert  and oriented  Airway & Oxygen Therapy: Patient Spontanous Breathing and Patient connected to face mask oxygen  Post-op Assessment: Report given to RN and Post -op Vital signs reviewed and stable  Post vital signs: Reviewed and stable  Last Vitals:  Vitals Value Taken Time  BP 143/93 08/23/19 1439  Temp    Pulse 113 08/23/19 1441  Resp 17 08/23/19 1441  SpO2 100 % 08/23/19 1441  Vitals shown include unvalidated device data.  Last Pain:  Vitals:   08/23/19 1242  TempSrc: Oral  PainSc: 3       Patients Stated Pain Goal: 3 (86/82/57 [id number])  Complications: No apparent anesthesia complications

## 2019-08-23 NOTE — Interval H&P Note (Signed)
History and Physical Interval Note:  08/23/2019 1:47 PM  Susan [name]  has presented today for surgery, with the diagnosis of Intermenstrual bleeding.  The various methods of treatment have been discussed with the patient and family. After consideration of risks, benefits and other options for treatment, the patient has consented to  Procedure(s) with comments: DILATATION AND CURETTAGE/HYSTEROSCOPY WITH MINERVA (N/A) - rep will be here for this case confirmed on 08/18/19. as a surgical intervention.  The patient's history has been reviewed, patient examined, no change in status, stable for surgery.  I have reviewed the patient's chart and labs.  Questions were answered to the patient's satisfaction.     Alinda Dooms, MD.  08/23/2019.

## 2019-08-23 NOTE — Anesthesia Postprocedure Evaluation (Signed)
Anesthesia Post Note  Patient: Susan [name]  Procedure(s) Performed: DILATATION AND CURETTAGE/HYSTEROSCOPY WITH MINERVA (N/A Vagina )     Patient location during evaluation: PACU Anesthesia Type: General Level of consciousness: awake and alert Pain management: pain level controlled Vital Signs Assessment: post-procedure vital signs reviewed and stable Respiratory status: spontaneous breathing, nonlabored ventilation and respiratory function stable Cardiovascular status: blood pressure returned to baseline and stable Postop Assessment: no apparent nausea or vomiting Anesthetic complications: no    Last Vitals:  Vitals:   08/23/19 1445 08/23/19 1500  BP: (!) 139/99 (!) 149/97  Pulse: 100 77  Resp: 13 13  Temp:    SpO2: 100% 100%    Last Pain:  Vitals:   08/23/19 1500  TempSrc:   PainSc: 3                  Damier Disano A.

## 2019-08-23 NOTE — Anesthesia Preprocedure Evaluation (Addendum)
Anesthesia Evaluation  Patient identified by MRN, date of birth, ID band Patient awake    Reviewed: Allergy & Precautions, NPO status , Patient's Chart, lab work & pertinent test results  History of Anesthesia Complications (+) PONV, Family history of anesthesia reaction and history of anesthetic complications  Airway Mallampati: II  TM Distance: >3 FB Neck ROM: Full    Dental no notable dental hx. (+) Teeth Intact   Pulmonary former smoker,    Pulmonary exam normal breath sounds clear to auscultation       Cardiovascular hypertension, Normal cardiovascular exam Rhythm:Regular Rate:Normal     Neuro/Psych PSYCHIATRIC DISORDERS Depression negative neurological ROS     GI/Hepatic negative GI ROS, (+)     substance abuse  alcohol use, Hepatitis -  Endo/Other  negative endocrine ROS  Renal/GU ARFRenal disease  negative genitourinary   Musculoskeletal negative musculoskeletal ROS (+)   Abdominal   Peds  Hematology negative hematology ROS (+) anemia ,   Anesthesia Other Findings   Reproductive/Obstetrics Intermenstrual bleeding                            Anesthesia Physical Anesthesia Plan  ASA: II  Anesthesia Plan: General   Post-op Pain Management:    Induction: Intravenous  PONV Risk Score and Plan: 4 or greater and Scopolamine patch - Pre-op, Midazolam, Ondansetron, Dexamethasone and Treatment may vary due to age or medical condition  Airway Management Planned: LMA  Additional Equipment:   Intra-op Plan:   Post-operative Plan: Extubation in OR  Informed Consent: I have reviewed the patients History and Physical, chart, labs and discussed the procedure including the risks, benefits and alternatives for the proposed anesthesia with the patient or authorized representative who has indicated his/her understanding and acceptance.     Dental advisory given  Plan Discussed  with: CRNA and Surgeon  Anesthesia Plan Comments:        Anesthesia Quick Evaluation

## 2019-08-23 NOTE — H&P (Signed)
Susan [name] is an 39 y.o. female P1 with a history of heavy periods and intermenstrual bleeding desiring endometrial ablation for symptom control.  Patient is Para 1 and does not desire any more children. We discussed that pregancy after endometrial ablation is not recommended due to the accompanying risks to the pregnancy from endometrial ablation including but not limited to miscarriages, placenta problems, preterm deliveries.       Pertinent Gynecological History: Menses: heavy prolonged periods Bleeding: intermenstrual bleeding Contraception: Depo-Provera injections DES exposure: unknown Blood transfusions: none Sexually transmitted diseases: no past history Previous GYN Procedures: None  Last mammogram: N/A Date: N/A Last pap: normal Date: 2019 OB History: G1, P1001   Menstrual History: No LMP recorded. Patient has had an injection.    Past Medical History:  Diagnosis Date  . AKI (acute kidney injury) ([place]) 06/15/2019   admitted with delhydration r/o arf  . Alcohol abuse   . Alcoholic hepatitis without ascites   . Depression   . Family history of adverse reaction to anesthesia    MOTHER HAD PANIC ATTACKS & NAUSEA  AFTER  . Gastritis   . GIB (gastrointestinal bleeding)   . Hypertension   . Iron deficiency anemia   . Pancreatitis   . PONV (postoperative nausea and vomiting)     Past Surgical History:  Procedure Laterality Date  . CERVICAL CERCLAGE Bilateral 01/18/2019   Procedure: CERCLAGE CERVICAL;  Surgeon: Everett Graff, MD;  Location: [facility];  Service: Gynecology;  Laterality: Bilateral;  . ESOPHAGOGASTRODUODENOSCOPY (EGD) WITH PROPOFOL N/A 06/05/2016   Procedure: ESOPHAGOGASTRODUODENOSCOPY (EGD) WITH PROPOFOL;  Surgeon: Otis Brace, MD;  Location: [facility];  Service: Gastroenterology;  Laterality: N/A;  . PLACEMENT OF BREAST IMPLANTS      Family History  Problem Relation Age of Onset  . Stroke Mother   . Diabetes Maternal Grandmother      Social History:  reports that she quit smoking about 6 years ago. She has a 7.50 pack-year smoking history. She has never used smokeless tobacco. She reports current alcohol use. She reports that she does not use drugs.  Allergies:  Allergies  Allergen Reactions  . Effexor [Venlafaxine] Palpitations and Other (See Comments)    Panic attacks  . Latex Rash    Tape irritates the skin/ please use paper tape  . Tape Rash    pls use paper tape    No medications prior to admission.    Review of Systems Constitutional: Denies fevers/chills Cardiovascular: Denies chest pain or palpitations Pulmonary: Denies coughing or wheezing Gastrointestinal: Denies nausea, vomiting or diarrhea Genitourinary: Denies pelvic pain, unusual vaginal bleeding, unusual vaginal discharge, dysuria, urgency or frequency.  Musculoskeletal: Denies muscle or joint aches and pain.  Neurology: Denies abnormal sensations such as tingling or numbness.   Height 5' 2"  (1.575 m), weight 64.9 kg, not currently breastfeeding.  Blood pressure (!) 142/100, pulse 82, temperature 98.7 F (37.1 C), temperature source Oral, height 5' 2"  (1.575 m), weight 68.9 kg, SpO2 99 %, not currently breastfeeding. Physical Exam Constitutional: She is oriented to person, place, and time. She appears well-developed and well-nourished.  HENT:  Head: Normocephalic and atraumatic.  Neck: Normal range of motion.  Cardiovascular: Normal rate, regular rhythm and normal heart sounds.   Respiratory: Effort normal and breath sounds normal.  GI: Soft. Bowel sounds are normal.  Neurological: She is alert and oriented to person, place, and time.  Skin: Skin is warm and dry.  Psychiatric: She has a normal mood and affect. Her  behavior is normal.  No results found for this or any previous visit (from the past 24 hour(s)).  No results found.   CBC    Component Value Date/Time   WBC 4.5 08/19/2019 1502   RBC 4.04 08/19/2019 1502   HGB 12.0  08/19/2019 1502   HCT 37.8 08/19/2019 1502   PLT 333 08/19/2019 1502   MCV 93.6 08/19/2019 1502   MCH 29.7 08/19/2019 1502   MCHC 31.7 08/19/2019 1502   RDW 17.0 (H) 08/19/2019 1502   LYMPHSABS 1.5 01/16/2019 1433   MONOABS 0.5 01/16/2019 1433   EOSABS 0.0 01/16/2019 1433   BASOSABS 0.1 01/16/2019 1433   Pregnancy, urine POC Order: [id number] Status:  Final result Visible to patient:  Yes (MyChart) Next appt:  None  Ref Range & Units 4 d ago  (08/19/19) 1 yr ago  (03/22/18) 2 yr ago  (02/28/17) 2 yr ago  (01/27/17)  Preg Test, Ur NEGATIVE NEGATIVE  NEGATIVE CM  NEGATIVE CM  NEGATIVE CM   Comment:     THE SENSITIVITY OF THIS  METHODOLOGY IS >24 mIU/mL        CMP     Component Value Date/Time   NA 139 08/19/2019 1502   K 4.3 08/19/2019 1502   CL 104 08/19/2019 1502   CO2 18 (L) 08/19/2019 1502   GLUCOSE 119 (H) 08/19/2019 1502   BUN 11 08/19/2019 1502   CREATININE 1.02 (H) 08/19/2019 1502   CALCIUM 9.2 08/19/2019 1502   PROT 8.1 08/19/2019 1502   ALBUMIN 4.3 08/19/2019 1502   AST 149 (H) 08/19/2019 1502   ALT 93 (H) 08/19/2019 1502   ALKPHOS 75 08/19/2019 1502   BILITOT 1.1 08/19/2019 1502   GFRNONAA >60 08/19/2019 1502   GFRAA >60 08/19/2019 1502    Assessment/Plan: 39 y/o P1 here for hysteroscopy and aendometrial ablation with Minerva use for history of heavy periods with intermenstrual bleeding  Admit to [facility] NPO and IV fluids This procedure has been fully reviewed with the patient and written informed consent has been obtained. Alinda Dooms, MD.  08/23/2019, 12:59 AM

## 2019-08-23 NOTE — Procedures (Addendum)
Name: Pharrah Rottman MRN: [id number] DOB: [birth year]  PREOP DIAGNOSIS:  1. Abnormal uterine bleeding. 2. Heavy periods with irregular cycles.      Post operative diagnosis: Same as above and  3. Endometrial lesions, suspect endometrial polyps.  Procedures: Dilation and Currettage hysteroscopy and Minerva ablation procedures.   Surgeon: Dr. Waymon Amato  Anesthesiologist: General anesthesia- LMA  Complications: None   IV fluid: per anesthesia  Fluid deficit: 95 cc  Urine:Bathroom void.   EBL: 10 cc  Indications: 39 year old Para 1 with a history of heavy and irregular periods here for D & C hysteroscopy and ablation.  An office biopsy had been normal.  Pelvic ultrasound showed small intramural fibroids.  She did not desire any more children and she understood that pregnancy after an ablation is not recommended due to associated pregnancy complications including but not limited to abnormal pregnancies, miscarriage, placenta problems and preterm delivery.     Procedure:  Informed consent was obtained from the patient to undergo the procedure including risks of bleeding, infection, damage to organs.  She was taken to the operating room and anesthesia was administered without difficulty. She was prepped and draped in the usual sterile fashion.  A graves speculum was used to view the cervix. Single-tooth tenaculum was placed anteriorly on the cervix. 10 cc of lidocaine with epinephrine instilled as a paracervical block.  Uterus sounded and cervical canal noted to be 4cm and uterus total sound to be 8 cm.  The cervix was dilated to 5 mm.  The diagnostic hysteroscope was then placed to view the uterine cavity and uterus was noted to have small white and pink polypoid and cystic lesions on posterior uterus wall as well as right uterine wall.  Currettage as well as polyp forceps was used to remove these lesions.  Repeat hysteroscope vie showed good sample removed.  Both tubal ostia were noted during  the procedure.   The Minerva ablation was then performed in the usual fashion for 2 minutes.  The Minerva device was then removed and repeat hysteroscopy performed with endometrial tissue noted to be blanched.  Hysteroscope and tenaculum were removed and silver nitrate applied to tenaculum sites bilaterally with good hemostasis noted.  Remaining instruments were then removed.  The patient was awoken from anesthesia and was noted to have temporary laryngospasm upon extubation that anesthesia managed.  She was then taken to the PACU in stable condition  Specimen: Endometrial curretings.    Disposition: Stable to PACU.   Dr. Alesia [name]. 08/23/2019.

## 2019-08-24 ENCOUNTER — Encounter: Payer: Self-pay | Admitting: *Deleted

## 2019-08-24 LAB — SURGICAL PATHOLOGY

## 2019-11-05 ENCOUNTER — Inpatient Hospital Stay (HOSPITAL_COMMUNITY): Payer: Medicaid Other

## 2019-11-05 ENCOUNTER — Emergency Department (HOSPITAL_COMMUNITY): Payer: Medicaid Other

## 2019-11-05 ENCOUNTER — Inpatient Hospital Stay (HOSPITAL_COMMUNITY)
Admission: EM | Admit: 2019-11-05 | Discharge: 2019-11-07 | DRG: 432 | Disposition: A | Discharge status: Home / Self Care | Payer: Medicaid Other | Attending: Pulmonary Disease | Admitting: Pulmonary Disease

## 2019-11-05 ENCOUNTER — Other Ambulatory Visit: Payer: Self-pay

## 2019-11-05 ENCOUNTER — Encounter (HOSPITAL_COMMUNITY): Payer: Self-pay

## 2019-11-05 DIAGNOSIS — Z87891 Personal history of nicotine dependence: Secondary | ICD-10-CM

## 2019-11-05 DIAGNOSIS — Z888 Allergy status to other drugs, medicaments and biological substances status: Secondary | ICD-10-CM

## 2019-11-05 DIAGNOSIS — Z20822 Contact with and (suspected) exposure to covid-19: Secondary | ICD-10-CM | POA: Diagnosis present

## 2019-11-05 DIAGNOSIS — E875 Hyperkalemia: Secondary | ICD-10-CM | POA: Diagnosis present

## 2019-11-05 DIAGNOSIS — T380X5A Adverse effect of glucocorticoids and synthetic analogues, initial encounter: Secondary | ICD-10-CM | POA: Diagnosis not present

## 2019-11-05 DIAGNOSIS — N170 Acute kidney failure with tubular necrosis: Secondary | ICD-10-CM | POA: Diagnosis present

## 2019-11-05 DIAGNOSIS — Z91040 Latex allergy status: Secondary | ICD-10-CM

## 2019-11-05 DIAGNOSIS — D7589 Other specified diseases of blood and blood-forming organs: Secondary | ICD-10-CM

## 2019-11-05 DIAGNOSIS — Z880 Allergy status to penicillin: Secondary | ICD-10-CM | POA: Diagnosis not present

## 2019-11-05 DIAGNOSIS — R739 Hyperglycemia, unspecified: Secondary | ICD-10-CM | POA: Diagnosis not present

## 2019-11-05 DIAGNOSIS — F1010 Alcohol abuse, uncomplicated: Secondary | ICD-10-CM | POA: Diagnosis present

## 2019-11-05 DIAGNOSIS — E872 Acidosis, unspecified: Secondary | ICD-10-CM | POA: Diagnosis present

## 2019-11-05 DIAGNOSIS — R0602 Shortness of breath: Secondary | ICD-10-CM

## 2019-11-05 DIAGNOSIS — E8729 Other acidosis: Secondary | ICD-10-CM

## 2019-11-05 DIAGNOSIS — T68XXXA Hypothermia, initial encounter: Secondary | ICD-10-CM

## 2019-11-05 DIAGNOSIS — K852 Alcohol induced acute pancreatitis without necrosis or infection: Secondary | ICD-10-CM | POA: Diagnosis present

## 2019-11-05 DIAGNOSIS — F10929 Alcohol use, unspecified with intoxication, unspecified: Secondary | ICD-10-CM

## 2019-11-05 DIAGNOSIS — E870 Hyperosmolality and hypernatremia: Secondary | ICD-10-CM | POA: Diagnosis present

## 2019-11-05 DIAGNOSIS — R6510 Systemic inflammatory response syndrome (SIRS) of non-infectious origin without acute organ dysfunction: Secondary | ICD-10-CM

## 2019-11-05 DIAGNOSIS — K7010 Alcoholic hepatitis without ascites: Secondary | ICD-10-CM

## 2019-11-05 DIAGNOSIS — K7682 Hepatic encephalopathy: Secondary | ICD-10-CM

## 2019-11-05 DIAGNOSIS — Z91048 Other nonmedicinal substance allergy status: Secondary | ICD-10-CM | POA: Diagnosis not present

## 2019-11-05 DIAGNOSIS — I10 Essential (primary) hypertension: Secondary | ICD-10-CM | POA: Diagnosis present

## 2019-11-05 DIAGNOSIS — N179 Acute kidney failure, unspecified: Secondary | ICD-10-CM | POA: Diagnosis not present

## 2019-11-05 DIAGNOSIS — K7290 Hepatic failure, unspecified without coma: Secondary | ICD-10-CM | POA: Diagnosis present

## 2019-11-05 DIAGNOSIS — R112 Nausea with vomiting, unspecified: Secondary | ICD-10-CM

## 2019-11-05 LAB — LACTIC ACID, PLASMA
Lactic Acid, Venous: >11 mmol/L (ref 0.5–1.9)
Lactic Acid, Venous: >11 mmol/L (ref 0.5–1.9)
Lactic Acid, Venous: >11 mmol/L (ref 0.5–1.9)
Lactic Acid, Venous: 5.4 mmol/L (ref 0.5–1.9)

## 2019-11-05 LAB — CBC WITH DIFFERENTIAL/PLATELET
Abs Immature Granulocytes: 0.18 10*3/uL — ABNORMAL HIGH (ref 0.00–0.07)
Abs Immature Granulocytes: 0.06 10*3/uL (ref 0.00–0.07)
Abs Immature Granulocytes: 0.04 10*3/uL (ref 0.00–0.07)
Abs Immature Granulocytes: 0.04 10*3/uL (ref 0.00–0.07)
Basophils Absolute: 0.1 10*3/uL (ref 0.0–0.1)
Basophils Absolute: 0 10*3/uL (ref 0.0–0.1)
Basophils Absolute: 0 10*3/uL (ref 0.0–0.1)
Basophils Absolute: 0 10*3/uL (ref 0.0–0.1)
Basophils Relative: 0 %
Basophils Relative: 0 %
Basophils Relative: 0 %
Basophils Relative: 0 %
Eosinophils Absolute: 0 10*3/uL (ref 0.0–0.5)
Eosinophils Absolute: 0 10*3/uL (ref 0.0–0.5)
Eosinophils Absolute: 0 10*3/uL (ref 0.0–0.5)
Eosinophils Absolute: 0 10*3/uL (ref 0.0–0.5)
Eosinophils Relative: 0 %
Eosinophils Relative: 0 %
Eosinophils Relative: 0 %
Eosinophils Relative: 0 %
HCT: 49.4 % — ABNORMAL HIGH (ref 36.0–46.0)
HCT: 38.3 % (ref 36.0–46.0)
HCT: 30.7 % — ABNORMAL LOW (ref 36.0–46.0)
HCT: 34 % — ABNORMAL LOW (ref 36.0–46.0)
Hemoglobin: 14.4 g/dL (ref 12.0–15.0)
Hemoglobin: 11.5 g/dL — ABNORMAL LOW (ref 12.0–15.0)
Hemoglobin: 9.4 g/dL — ABNORMAL LOW (ref 12.0–15.0)
Hemoglobin: 11 g/dL — ABNORMAL LOW (ref 12.0–15.0)
Immature Granulocytes: 1 %
Immature Granulocytes: 1 %
Immature Granulocytes: 1 %
Immature Granulocytes: 0 %
Lymphocytes Relative: 5 %
Lymphocytes Relative: 3 %
Lymphocytes Relative: 1 %
Lymphocytes Relative: 2 %
Lymphs Abs: 1.1 10*3/uL (ref 0.7–4.0)
Lymphs Abs: 0.4 10*3/uL — ABNORMAL LOW (ref 0.7–4.0)
Lymphs Abs: 0.1 10*3/uL — ABNORMAL LOW (ref 0.7–4.0)
Lymphs Abs: 0.2 10*3/uL — ABNORMAL LOW (ref 0.7–4.0)
MCH: 32.1 pg (ref 26.0–34.0)
MCH: 31.9 pg (ref 26.0–34.0)
MCH: 31.9 pg (ref 26.0–34.0)
MCH: 32 pg (ref 26.0–34.0)
MCHC: 29.1 g/dL — ABNORMAL LOW (ref 30.0–36.0)
MCHC: 30 g/dL (ref 30.0–36.0)
MCHC: 30.6 g/dL (ref 30.0–36.0)
MCHC: 32.4 g/dL (ref 30.0–36.0)
MCV: 110.3 fL — ABNORMAL HIGH (ref 80.0–100.0)
MCV: 106.4 fL — ABNORMAL HIGH (ref 80.0–100.0)
MCV: 104.1 fL — ABNORMAL HIGH (ref 80.0–100.0)
MCV: 98.8 fL (ref 80.0–100.0)
Monocytes Absolute: 1.4 10*3/uL — ABNORMAL HIGH (ref 0.1–1.0)
Monocytes Absolute: 0.3 10*3/uL (ref 0.1–1.0)
Monocytes Absolute: 0.2 10*3/uL (ref 0.1–1.0)
Monocytes Absolute: 0.3 10*3/uL (ref 0.1–1.0)
Monocytes Relative: 7 %
Monocytes Relative: 2 %
Monocytes Relative: 3 %
Monocytes Relative: 4 %
Neutro Abs: 17.5 10*3/uL — ABNORMAL HIGH (ref 1.7–7.7)
Neutro Abs: 11.5 10*3/uL — ABNORMAL HIGH (ref 1.7–7.7)
Neutro Abs: 8.1 10*3/uL — ABNORMAL HIGH (ref 1.7–7.7)
Neutro Abs: 8.7 10*3/uL — ABNORMAL HIGH (ref 1.7–7.7)
Neutrophils Relative %: 87 %
Neutrophils Relative %: 94 %
Neutrophils Relative %: 95 %
Neutrophils Relative %: 94 %
Platelets: 260 10*3/uL (ref 150–400)
Platelets: 149 10*3/uL — ABNORMAL LOW (ref 150–400)
Platelets: 116 10*3/uL — ABNORMAL LOW (ref 150–400)
Platelets: 126 10*3/uL — ABNORMAL LOW (ref 150–400)
RBC: 4.48 MIL/uL (ref 3.87–5.11)
RBC: 3.6 MIL/uL — ABNORMAL LOW (ref 3.87–5.11)
RBC: 2.95 MIL/uL — ABNORMAL LOW (ref 3.87–5.11)
RBC: 3.44 MIL/uL — ABNORMAL LOW (ref 3.87–5.11)
RDW: 18.8 % — ABNORMAL HIGH (ref 11.5–15.5)
RDW: 18.8 % — ABNORMAL HIGH (ref 11.5–15.5)
RDW: 18.8 % — ABNORMAL HIGH (ref 11.5–15.5)
RDW: 18.6 % — ABNORMAL HIGH (ref 11.5–15.5)
WBC: 20.2 10*3/uL — ABNORMAL HIGH (ref 4.0–10.5)
WBC: 12.2 10*3/uL — ABNORMAL HIGH (ref 4.0–10.5)
WBC: 8.5 10*3/uL (ref 4.0–10.5)
WBC: 9.3 10*3/uL (ref 4.0–10.5)
nRBC: 0.3 % — ABNORMAL HIGH (ref 0.0–0.2)
nRBC: 0.2 % (ref 0.0–0.2)
nRBC: 0 % (ref 0.0–0.2)
nRBC: 0 % (ref 0.0–0.2)

## 2019-11-05 LAB — COMPREHENSIVE METABOLIC PANEL
ALT: 299 U/L — ABNORMAL HIGH (ref 0–44)
ALT: 213 U/L — ABNORMAL HIGH (ref 0–44)
ALT: 197 U/L — ABNORMAL HIGH (ref 0–44)
AST: 1067 U/L — ABNORMAL HIGH (ref 15–41)
AST: 777 U/L — ABNORMAL HIGH (ref 15–41)
AST: 700 U/L — ABNORMAL HIGH (ref 15–41)
Albumin: 5.1 g/dL — ABNORMAL HIGH (ref 3.5–5.0)
Albumin: 3.5 g/dL (ref 3.5–5.0)
Albumin: 3.3 g/dL — ABNORMAL LOW (ref 3.5–5.0)
Alkaline Phosphatase: 131 U/L — ABNORMAL HIGH (ref 38–126)
Alkaline Phosphatase: 92 U/L (ref 38–126)
Alkaline Phosphatase: 81 U/L (ref 38–126)
Anion gap: 37 — ABNORMAL HIGH (ref 5–15)
Anion gap: 26 — ABNORMAL HIGH (ref 5–15)
BUN: 16 mg/dL (ref 6–20)
BUN: 15 mg/dL (ref 6–20)
BUN: 15 mg/dL (ref 6–20)
CO2: <7 mmol/L — ABNORMAL LOW (ref 22–32)
CO2: 8 mmol/L — ABNORMAL LOW (ref 22–32)
CO2: 13 mmol/L — ABNORMAL LOW (ref 22–32)
Calcium: 8.7 mg/dL — ABNORMAL LOW (ref 8.9–10.3)
Calcium: 6.7 mg/dL — ABNORMAL LOW (ref 8.9–10.3)
Calcium: 6.5 mg/dL — ABNORMAL LOW (ref 8.9–10.3)
Chloride: 99 mmol/L (ref 98–111)
Chloride: 103 mmol/L (ref 98–111)
Chloride: 107 mmol/L (ref 98–111)
Creatinine, Ser: 3.11 mg/dL — ABNORMAL HIGH (ref 0.44–1.00)
Creatinine, Ser: 2.79 mg/dL — ABNORMAL HIGH (ref 0.44–1.00)
Creatinine, Ser: 2.07 mg/dL — ABNORMAL HIGH (ref 0.44–1.00)
GFR calc Af Amer: 21 mL/min — ABNORMAL LOW (ref >60)
GFR calc Af Amer: 24 mL/min — ABNORMAL LOW (ref >60)
GFR calc Af Amer: 34 mL/min — ABNORMAL LOW (ref >60)
GFR calc non Af Amer: 18 mL/min — ABNORMAL LOW (ref >60)
GFR calc non Af Amer: 21 mL/min — ABNORMAL LOW (ref >60)
GFR calc non Af Amer: 30 mL/min — ABNORMAL LOW (ref >60)
Glucose, Bld: 177 mg/dL — ABNORMAL HIGH (ref 70–99)
Glucose, Bld: 122 mg/dL — ABNORMAL HIGH (ref 70–99)
Glucose, Bld: 346 mg/dL — ABNORMAL HIGH (ref 70–99)
Potassium: 5.2 mmol/L — ABNORMAL HIGH (ref 3.5–5.1)
Potassium: 5.6 mmol/L — ABNORMAL HIGH (ref 3.5–5.1)
Potassium: 4.8 mmol/L (ref 3.5–5.1)
Sodium: 146 mmol/L — ABNORMAL HIGH (ref 135–145)
Sodium: 148 mmol/L — ABNORMAL HIGH (ref 135–145)
Sodium: 146 mmol/L — ABNORMAL HIGH (ref 135–145)
Total Bilirubin: 3.3 mg/dL — ABNORMAL HIGH (ref 0.3–1.2)
Total Bilirubin: 4.4 mg/dL — ABNORMAL HIGH (ref 0.3–1.2)
Total Bilirubin: 4 mg/dL — ABNORMAL HIGH (ref 0.3–1.2)
Total Protein: 9.6 g/dL — ABNORMAL HIGH (ref 6.5–8.1)
Total Protein: 6.5 g/dL (ref 6.5–8.1)
Total Protein: 6.2 g/dL — ABNORMAL LOW (ref 6.5–8.1)

## 2019-11-05 LAB — PHOSPHORUS: Phosphorus: 13.5 mg/dL — ABNORMAL HIGH (ref 2.5–4.6)

## 2019-11-05 LAB — BLOOD GAS, VENOUS
Acid-base deficit: 28.2 mmol/L — ABNORMAL HIGH (ref 0.0–2.0)
Bicarbonate: 5.5 mmol/L — ABNORMAL LOW (ref 20.0–28.0)
O2 Saturation: 34.8 %
Patient temperature: 98.6
pCO2, Ven: 29.4 mmHg — ABNORMAL LOW (ref 44.0–60.0)
pH, Ven: 6.903 — CL (ref 7.250–7.430)
pO2, Ven: 34.9 mmHg (ref 32.0–45.0)

## 2019-11-05 LAB — MAGNESIUM
Magnesium: 2.9 mg/dL — ABNORMAL HIGH (ref 1.7–2.4)
Magnesium: 1.6 mg/dL — ABNORMAL LOW (ref 1.7–2.4)

## 2019-11-05 LAB — BLOOD GAS, ARTERIAL
Acid-Base Excess: 4.6 mmol/L — ABNORMAL HIGH (ref 0.0–2.0)
Bicarbonate: 26.5 mmol/L (ref 20.0–28.0)
FIO2: 21
O2 Saturation: 96.7 %
Patient temperature: 98.6
pCO2 arterial: 30.2 mmHg — ABNORMAL LOW (ref 32.0–48.0)
pH, Arterial: 7.552 — ABNORMAL HIGH (ref 7.350–7.450)
pO2, Arterial: 76 mmHg — ABNORMAL LOW (ref 83.0–108.0)

## 2019-11-05 LAB — URINALYSIS, ROUTINE W REFLEX MICROSCOPIC
Bilirubin Urine: NEGATIVE
Glucose, UA: NEGATIVE mg/dL
Ketones, ur: 20 mg/dL — AB
Leukocytes,Ua: NEGATIVE
Nitrite: NEGATIVE
Protein, ur: >=300 mg/dL — AB
Specific Gravity, Urine: 1.017 (ref 1.005–1.030)
pH: 5 (ref 5.0–8.0)

## 2019-11-05 LAB — BASIC METABOLIC PANEL
Anion gap: 21 — ABNORMAL HIGH (ref 5–15)
BUN: 18 mg/dL (ref 6–20)
CO2: 26 mmol/L (ref 22–32)
Calcium: 7.3 mg/dL — ABNORMAL LOW (ref 8.9–10.3)
Chloride: 99 mmol/L (ref 98–111)
Creatinine, Ser: 1.71 mg/dL — ABNORMAL HIGH (ref 0.44–1.00)
GFR calc Af Amer: 43 mL/min — ABNORMAL LOW (ref >60)
GFR calc non Af Amer: 37 mL/min — ABNORMAL LOW (ref >60)
Glucose, Bld: 371 mg/dL — ABNORMAL HIGH (ref 70–99)
Potassium: 3.1 mmol/L — ABNORMAL LOW (ref 3.5–5.1)
Sodium: 146 mmol/L — ABNORMAL HIGH (ref 135–145)

## 2019-11-05 LAB — GLUCOSE, CAPILLARY
Glucose-Capillary: 361 mg/dL — ABNORMAL HIGH (ref 70–99)
Glucose-Capillary: 272 mg/dL — ABNORMAL HIGH (ref 70–99)

## 2019-11-05 LAB — I-STAT BETA HCG BLOOD, ED (MC, WL, AP ONLY): I-stat hCG, quantitative: <5 m[IU]/mL (ref <5)

## 2019-11-05 LAB — AMMONIA: Ammonia: 106 umol/L — ABNORMAL HIGH (ref 9–35)

## 2019-11-05 LAB — APTT: aPTT: 33 seconds (ref 24–36)

## 2019-11-05 LAB — OCCULT BLOOD GASTRIC / DUODENUM (SPECIMEN CUP)
Occult Blood, Gastric: POSITIVE — AB
pH, Gastric: 3

## 2019-11-05 LAB — CBG MONITORING, ED
Glucose-Capillary: 149 mg/dL — ABNORMAL HIGH (ref 70–99)
Glucose-Capillary: 402 mg/dL — ABNORMAL HIGH (ref 70–99)

## 2019-11-05 LAB — RAPID URINE DRUG SCREEN, HOSP PERFORMED
Amphetamines: NOT DETECTED
Barbiturates: NOT DETECTED
Benzodiazepines: NOT DETECTED
Cocaine: NOT DETECTED
Opiates: NOT DETECTED
Tetrahydrocannabinol: NOT DETECTED

## 2019-11-05 LAB — LIPASE, BLOOD: Lipase: 1268 U/L — ABNORMAL HIGH (ref 11–51)

## 2019-11-05 LAB — PROTIME-INR
INR: 1.3 — ABNORMAL HIGH (ref 0.8–1.2)
Prothrombin Time: 15.6 seconds — ABNORMAL HIGH (ref 11.4–15.2)

## 2019-11-05 LAB — SARS CORONAVIRUS 2 BY RT PCR (HOSPITAL ORDER, PERFORMED IN ~~LOC~~ HOSPITAL LAB): SARS Coronavirus 2: NEGATIVE

## 2019-11-05 LAB — MRSA PCR SCREENING: MRSA by PCR: NEGATIVE

## 2019-11-05 LAB — ETHANOL: Alcohol, Ethyl (B): 166 mg/dL — ABNORMAL HIGH (ref <10)

## 2019-11-05 MED ORDER — PROMETHAZINE HCL 25 MG/ML IJ SOLN
6.2500 mg | Freq: Four times a day (QID) | INTRAMUSCULAR | Status: DC | PRN
  Administered 2019-11-05 (×2): 6.25 mg via INTRAVENOUS
  Filled 2019-11-05 (×2): qty 1

## 2019-11-05 MED ORDER — PROMETHAZINE HCL 25 MG/ML IJ SOLN: 12.5000 mg | Freq: Four times a day (QID) | INTRAMUSCULAR | Status: DC | PRN

## 2019-11-05 MED ORDER — INSULIN ASPART 100 UNIT/ML ~~LOC~~ SOLN
22.0000 [IU] | Freq: Once | SUBCUTANEOUS | Status: AC
  Administered 2019-11-05: 22 [IU] via SUBCUTANEOUS
  Filled 2019-11-05: qty 0.22

## 2019-11-05 MED ORDER — POTASSIUM PHOSPHATES 15 MMOLE/5ML IV SOLN
20.0000 mmol | Freq: Once | INTRAVENOUS | Status: AC
  Administered 2019-11-05: 20 mmol via INTRAVENOUS
  Filled 2019-11-05: qty 6.67

## 2019-11-05 MED ORDER — VANCOMYCIN HCL 1250 MG/250ML IV SOLN
1250.0000 mg | Freq: Once | INTRAVENOUS | Status: AC
  Administered 2019-11-05: 1250 mg via INTRAVENOUS
  Filled 2019-11-05: qty 250

## 2019-11-05 MED ORDER — SODIUM BICARBONATE-DEXTROSE 150-5 MEQ/L-% IV SOLN
150.0000 meq | INTRAVENOUS | Status: DC
  Administered 2019-11-05 (×2): 150 meq via INTRAVENOUS
  Filled 2019-11-05 (×3): qty 1000

## 2019-11-05 MED ORDER — INSULIN ASPART 100 UNIT/ML ~~LOC~~ SOLN
0.0000 [IU] | SUBCUTANEOUS | Status: DC
  Administered 2019-11-05: 20 [IU] via SUBCUTANEOUS
  Administered 2019-11-05: 11 [IU] via SUBCUTANEOUS
  Administered 2019-11-06: 4 [IU] via SUBCUTANEOUS
  Administered 2019-11-06: 3 [IU] via SUBCUTANEOUS
  Administered 2019-11-06 (×2): 4 [IU] via SUBCUTANEOUS
  Administered 2019-11-06: 7 [IU] via SUBCUTANEOUS
  Administered 2019-11-07: 4 [IU] via SUBCUTANEOUS
  Filled 2019-11-05: qty 0.2

## 2019-11-05 MED ORDER — POLYETHYLENE GLYCOL 3350 17 G PO PACK: 17.0000 g | PACK | Freq: Every day | ORAL | Status: DC | PRN

## 2019-11-05 MED ORDER — FOLIC ACID 5 MG/ML IJ SOLN
1.0000 mg | Freq: Every day | INTRAMUSCULAR | Status: DC
  Administered 2019-11-05: 1 mg via INTRAVENOUS
  Filled 2019-11-05 (×2): qty 0.2

## 2019-11-05 MED ORDER — LORAZEPAM 2 MG/ML IJ SOLN
2.0000 mg | Freq: Once | INTRAMUSCULAR | Status: AC
  Administered 2019-11-05: 2 mg via INTRAVENOUS
  Filled 2019-11-05: qty 1

## 2019-11-05 MED ORDER — PIPERACILLIN-TAZOBACTAM 3.375 G IVPB 30 MIN
3.3750 g | Freq: Once | INTRAVENOUS | Status: AC
  Administered 2019-11-05: 3.375 g via INTRAVENOUS
  Filled 2019-11-05: qty 50

## 2019-11-05 MED ORDER — HEPARIN SODIUM (PORCINE) 5000 UNIT/ML IJ SOLN
5000.0000 [IU] | Freq: Three times a day (TID) | INTRAMUSCULAR | Status: DC
  Administered 2019-11-05 – 2019-11-06 (×5): 5000 [IU] via SUBCUTANEOUS
  Filled 2019-11-05 (×6): qty 1

## 2019-11-05 MED ORDER — CHLORHEXIDINE GLUCONATE CLOTH 2 % EX PADS
6.0000 | MEDICATED_PAD | Freq: Every day | CUTANEOUS | Status: DC
  Administered 2019-11-05 – 2019-11-06 (×2): 6 via TOPICAL

## 2019-11-05 MED ORDER — METHYLPREDNISOLONE SODIUM SUCC 40 MG IJ SOLR
40.0000 mg | INTRAMUSCULAR | Status: DC
  Administered 2019-11-06: 40 mg via INTRAVENOUS
  Filled 2019-11-05: qty 1

## 2019-11-05 MED ORDER — SODIUM CHLORIDE 0.9 % IV BOLUS
1000.0000 mL | Freq: Once | INTRAVENOUS | Status: AC
  Administered 2019-11-05: 1000 mL via INTRAVENOUS

## 2019-11-05 MED ORDER — DOCUSATE SODIUM 100 MG PO CAPS: 100.0000 mg | ORAL_CAPSULE | Freq: Two times a day (BID) | ORAL | Status: DC | PRN

## 2019-11-05 MED ORDER — PANTOPRAZOLE SODIUM 40 MG IV SOLR
40.0000 mg | INTRAVENOUS | Status: DC
  Administered 2019-11-05: 40 mg via INTRAVENOUS
  Filled 2019-11-05: qty 40

## 2019-11-05 MED ORDER — LACTATED RINGERS IV SOLN: INTRAVENOUS | Status: DC

## 2019-11-05 MED ORDER — ONDANSETRON HCL 4 MG/2ML IJ SOLN: 4.0000 mg | Freq: Once | INTRAMUSCULAR | Status: DC

## 2019-11-05 MED ORDER — INSULIN ASPART 100 UNIT/ML ~~LOC~~ SOLN
1.0000 [IU] | SUBCUTANEOUS | Status: DC
  Filled 2019-11-05: qty 0.03

## 2019-11-05 MED ORDER — SODIUM BICARBONATE 8.4 % IV SOLN
100.0000 meq | Freq: Once | INTRAVENOUS | Status: AC
  Administered 2019-11-05: 100 meq via INTRAVENOUS
  Filled 2019-11-05: qty 100

## 2019-11-05 MED ORDER — MAGNESIUM SULFATE 2 GM/50ML IV SOLN
2.0000 g | Freq: Once | INTRAVENOUS | Status: AC
  Administered 2019-11-05: 2 g via INTRAVENOUS
  Filled 2019-11-05: qty 50

## 2019-11-05 MED ORDER — METHYLPREDNISOLONE SODIUM SUCC 40 MG IJ SOLR
40.0000 mg | Freq: Four times a day (QID) | INTRAMUSCULAR | Status: DC
  Administered 2019-11-05: 40 mg via INTRAVENOUS
  Filled 2019-11-05 (×2): qty 1

## 2019-11-05 MED ORDER — MORPHINE SULFATE (PF) 2 MG/ML IV SOLN
2.0000 mg | INTRAVENOUS | Status: DC | PRN
  Administered 2019-11-05 – 2019-11-06 (×4): 2 mg via INTRAVENOUS
  Filled 2019-11-05 (×4): qty 1

## 2019-11-05 MED ORDER — SODIUM BICARBONATE 8.4 % IV SOLN
50.0000 meq | Freq: Once | INTRAVENOUS | Status: AC
  Administered 2019-11-05: 50 meq via INTRAVENOUS
  Filled 2019-11-05: qty 50

## 2019-11-05 MED ORDER — THIAMINE HCL 100 MG/ML IJ SOLN
100.0000 mg | Freq: Every day | INTRAMUSCULAR | Status: DC
  Administered 2019-11-05: 100 mg via INTRAVENOUS
  Filled 2019-11-05: qty 2

## 2019-11-05 MED ORDER — LORAZEPAM 2 MG/ML IJ SOLN: 1.0000 mg | Freq: Once | INTRAMUSCULAR | Status: DC

## 2019-11-05 MED ORDER — THIAMINE HCL 100 MG/ML IJ SOLN
Freq: Once | INTRAVENOUS | Status: AC
  Filled 2019-11-05: qty 1000

## 2019-11-05 NOTE — ED Notes (Signed)
Bair Hugger applied per MD request.

## 2019-11-05 NOTE — H&P (Signed)
NAME:  Susan [name], MRN:  [id number], DOB:  [birth year], LOS: 0 ADMISSION DATE:  11/05/2019, CONSULTATION DATE: 11/05/2019 REFERRING MD: ED, CHIEF COMPLAINT: Abdominal pain   History of present illness   39 year old lady with history of alcohol abuse coming in with 2 days history of epigastric abdominal pain and recurrent vomiting with some coffee-ground vomitus. Patient states that he has been alcohol binging with her friends. Patient denies any fever chills or rigors. On arrival to the ED patient was found to be hypotensive received 2 L of IV fluids. Patient was also found to be severely acidotic with pH of 6.9 and bicarb of less than seven and severe lactic acidosis of more than 11. With elevated lipase of 1200 , elevated LFTs.  I came to assess the patient patient blood pressure has improved with IV fluids but patient remains very nauseated and dry heaving. As per bedside nurse she looks overall better than when she first got here. Patient received 1 ampoule of sodium bicarb and recommended giving two more ampules of sodium bicarb and started bicarb drip.  Past Medical History  -Alcohol abuse -History of pancreatitis    Objective   Blood pressure 106/83, pulse (!) 134, temperature (!) 96.9 F (36.1 C), temperature source Rectal, resp. rate (!) 28, height 5' 3"  (1.6 m), weight 63.5 kg, SpO2 100 %, not currently breastfeeding.       No intake or output data in the 24 hours ending 11/05/19 0458 Filed Weights   11/05/19 0127  Weight: 63.5 kg    Examination: General: Acutely ill in severe distress HENT: Dry mucous membranes Lungs: Clear equal air sounds bilateral no crackles no wheezing Cardiovascular: Normal heart sound no added sounds or murmurs Abdomen: Diffuse tenderness no guarding no rebound Extremities: No lower limb edema Neuro: Alert oriented time place and person moving all extremities   Assessment & Plan:    Assessment: -Acute pancreatitis -Acute alcoholic  hepatitis -Acute kidney injury -Severe metabolic acidosis combination of lactic acidosis alcoholic ketoacidosis and renal failure -Hypernatremia -Hyperkalemia   Plan: -Admit to the intensive care for further management -I will give two more ampules of sodium bicarb -Start bicarb drip -Thiamine and folic acid -Repeat serial lactic acid CMP and CBC -Daily INR -Follow urine output and renal function -Start IV Solu-Medrol for acute alcoholic hepatitis -N.p.o. -Stat CT abdomen without contrast -SCDs for DVT prophylaxis -I will hold off on antibiotics for now no signs of infection -Full code    Labs   CBC: Recent Labs  Lab 11/05/19 0144  WBC 20.2*  NEUTROABS 17.5*  HGB 14.4  HCT 49.4*  MCV 110.3*  PLT 010    Basic Metabolic Panel: Recent Labs  Lab 11/05/19 0144 11/05/19 0154  NA 146*  --   K 5.2*  --   CL 99  --   CO2 <7*  --   GLUCOSE 177*  --   BUN 16  --   CREATININE 3.11*  --   CALCIUM 8.7*  --   MG  --  2.9*  PHOS  --  13.5*   GFR: Estimated Creatinine Clearance: 22 mL/min (A) (by C-G formula based on SCr of 3.11 mg/dL (H)). Recent Labs  Lab 11/05/19 0144 11/05/19 0407  WBC 20.2*  --   LATICACIDVEN >11.0* >11.0*    Liver Function Tests: Recent Labs  Lab 11/05/19 0144  AST 1,067*  ALT 299*  ALKPHOS 131*  BILITOT 3.3*  PROT 9.6*  ALBUMIN 5.1*   Recent Labs  Lab 11/05/19 0144  LIPASE 1,268*   Recent Labs  Lab 11/05/19 0143  AMMONIA 106*    ABG    Component Value Date/Time   HCO3 5.5 (L) 11/05/2019 0144   TCO2 18 (L) 06/16/2019 0642   ACIDBASEDEF 28.2 (H) 11/05/2019 0144   O2SAT 34.8 11/05/2019 0144     Coagulation Profile: Recent Labs  Lab 11/05/19 0144  INR 1.3*    Cardiac Enzymes: No results for input(s): CKTOTAL, CKMB, CKMBINDEX, TROPONINI in the last 168 hours.  HbA1C: Hgb A1c MFr Bld  Date/Time Value Ref Range Status  06/16/2019 10:05 AM 5.3 4.8 - 5.6 % Final    Comment:    (NOTE) Pre diabetes:           5.7%-6.4% Diabetes:              >6.4% Glycemic control for   <7.0% adults with diabetes   06/04/2016 06:38 PM 5.1 4.8 - 5.6 % Final    Comment:    (NOTE)         Pre-diabetes: 5.7 - 6.4         Diabetes: >6.4         Glycemic control for adults with diabetes: <7.0     CBG: Recent Labs  Lab 11/05/19 0215  GLUCAP 149*    Review of Systems:   All 12 point system review is unremarkable other than what is mentioned in the history of present illness  Past Medical History  She,  has a past medical history of AKI (acute kidney injury) ([place]) (06/15/2019), Alcohol abuse, Alcoholic 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   Surgical History    Past Surgical History:  Procedure Laterality Date  . CERVICAL CERCLAGE Bilateral 01/18/2019   Procedure: CERCLAGE CERVICAL;  Surgeon: Everett Graff, MD;  Location: [facility];  Service: Gynecology;  Laterality: Bilateral;  . DILATATION AND CURETTAGE/HYSTEROSCOPY WITH MINERVA N/A 08/23/2019   Procedure: DILATATION AND CURETTAGE/HYSTEROSCOPY WITH MINERVA;  Surgeon: Waymon Amato, MD;  Location: [place];  Service: Gynecology;  Laterality: N/A;  rep will be here for this case confirmed on 08/18/19.  . ESOPHAGOGASTRODUODENOSCOPY (EGD) WITH PROPOFOL N/A 06/05/2016   Procedure: ESOPHAGOGASTRODUODENOSCOPY (EGD) WITH PROPOFOL;  Surgeon: Otis Brace, MD;  Location: [facility];  Service: Gastroenterology;  Laterality: N/A;  . PLACEMENT OF BREAST IMPLANTS       Social History   reports that she quit smoking about 6 years ago. She has a 7.50 pack-year smoking history. She has never used smokeless tobacco. She reports current alcohol use. She reports that she does not use drugs.   Family History   Her family history includes Diabetes in her maternal  grandmother; Stroke in her mother.   Allergies Allergies  Allergen Reactions  . Penicillins Nausea And Vomiting  . Effexor [Venlafaxine] Palpitations and Other (See Comments)    Panic attacks  . Latex Rash    Tape irritates the skin/ please use paper tape  . Tape Rash    pls use paper tape     Home Medications  Prior to Admission medications   Medication Sig Start Date End Date Taking? Authorizing Provider  ibuprofen (ADVIL) 800 MG tablet Take 1 tablet (800 mg total) by mouth every 8 (eight) hours as needed for moderate pain or cramping. Patient not taking: Reported on 11/05/2019 08/23/19   Waymon Amato, MD  ondansetron [facility])  4 MG tablet Take 1 tablet (4 mg total) by mouth every 8 (eight) hours as needed for nausea or vomiting. Patient not taking: Reported on 11/05/2019 08/23/19   Waymon Amato, MD

## 2019-11-05 NOTE — Progress Notes (Signed)
A consult was received from an ED physician for vancomycin and Zosyn per pharmacy dosing.  The patient's profile has been reviewed for ht/wt/allergies/indication/available labs.   A one time order has been placed for vancomycin 1250 mg and Zosyn 3.375 g.  Further antibiotics/pharmacy consults should be ordered by admitting physician if indicated.                       Thank you, Valentina Gu 11/05/2019  3:11 AM

## 2019-11-05 NOTE — ED Notes (Signed)
Date and time results received: 11/05/19 0238 (use smartphrase ".now" to insert current time)  Test: lactic acid  Critical Value: >11.0  Name of Provider Notified: Molpusd, MD   Orders Received? Or Actions Taken?: Orders Received - See Orders for details

## 2019-11-05 NOTE — Progress Notes (Signed)
NAME:  Susan [name], MRN:  [id number], DOB:  12-03-1980, LOS: 0 ADMISSION DATE:  11/05/2019, CONSULTATION DATE: 11/05/2019 REFERRING MD: ED, CHIEF COMPLAINT: Abdominal pain   History of present illness   39 yo female with hx of ETOH and pancreatitis started drinking again.  Presented to ER on 5/28 with 2 days of epigastric and back pain associated with nausea and vomiting.  In ER found to have acute pancreatitis, alcoholic hepatitis, anion gap acidosis with lactic acidosis, AKI with hyperkalemia.  PCCM consulted to admit to ICU.  Past Medical History  ETOH, Pancretitis  Significant Events:  5/28 Admit  Consults:    Procedures:    Significant Studies:  CT abd/pelvis 5/28 >> marked fatty liver, mildly atrophic pancreas with mild changes of acute pancreatitis, normal spleen, IUD in uterus  Micro:  Blood 5/28 >> SARS CoV2 5/28 >> negative  Antibiotics:  Vancomycin 5/28 Zosyn 5/28   Subjective:  Has gotten the shakes before if she doesn't drink alcohol.  Was sober for few weeks, before starting to drink again recently.  Denies drinking moonshine or using other illicit drugs.  C/o nausea and epigastric pain that radiates to her mid back.  Objective   Blood pressure (!) 143/106, pulse (!) 118, temperature 98.8 F (37.1 C), temperature source Oral, resp. rate (!) 27, height 5\' 3"  (1.6 m), weight 63.5 kg, SpO2 100 %, not currently breastfeeding.       No intake or output data in the 24 hours ending 11/05/19 0958 Filed Weights   11/05/19 0127  Weight: 63.5 kg    Examination:  General - alert, fidgety Eyes - pupils reactive ENT - no sinus tenderness, no stridor Cardiac - regular, tachycardic Chest - equal breath sounds b/l, no wheezing or rales Abdomen - soft, mild epigastric tenderness, decreased bowel sounds Extremities - no edema Skin - no rashes Neuro - follows commands, moves extremities  Assessment & Plan:   Acute alcoholic pancreatitis with acute alcoholic  hepatitis. - NPO for now - prn morphine, phenergan - continue solumedrol - f/u LFTs - thiamine, folic acid - monitor for signs of withdrawal  AKI from ATN in setting of pancreatitis. Anion gap metabolic acidosis with lactic acidosis. Hyperkalemia from acidosis. - baseline creatinine 0.83 from 06/17/19 - f/u BMET, ABG - continue HCO3 in IV fluids  Steroid induced hyperglycemia. - no hx of DM - SSI  Best Practice:  Diet: NPO DVT: SQ heparin SUP: Protonix Mobility: bed rest Goals of care: Full code Disposition: ICU  Labs:   CMP Latest Ref Rng & Units 11/05/2019 11/05/2019 08/19/2019  Glucose 70 - 99 mg/dL 122(H) 177(H) 119(H)  BUN 6 - 20 mg/dL 15 16 11   Creatinine 0.44 - 1.00 mg/dL 2.79(H) 3.11(H) 1.02(H)  Sodium 135 - 145 mmol/L 148(H) 146(H) 139  Potassium 3.5 - 5.1 mmol/L 5.6(H) 5.2(H) 4.3  Chloride 98 - 111 mmol/L 103 99 104  CO2 22 - 32 mmol/L 8(L) <7(L) 18(L)  Calcium 8.9 - 10.3 mg/dL 6.7(L) 8.7(L) 9.2  Total Protein 6.5 - 8.1 g/dL 6.5 9.6(H) 8.1  Total Bilirubin 0.3 - 1.2 mg/dL 4.4(H) 3.3(H) 1.1  Alkaline Phos 38 - 126 U/L 92 131(H) 75  AST 15 - 41 U/L 777(H) 1,067(H) 149(H)  ALT 0 - 44 U/L 213(H) 299(H) 93(H)    CBC Latest Ref Rng & Units 11/05/2019 11/05/2019 08/19/2019  WBC 4.0 - 10.5 K/uL 12.2(H) 20.2(H) 4.5  Hemoglobin 12.0 - 15.0 g/dL 11.5(L) 14.4 12.0  Hematocrit 36.0 - 46.0 % 38.3  49.4(H) 37.8  Platelets 150 - 400 K/uL 149(L) 260 333    ABG    Component Value Date/Time   HCO3 5.5 (L) 11/05/2019 0144   TCO2 18 (L) 06/16/2019 0642   ACIDBASEDEF 28.2 (H) 11/05/2019 0144   O2SAT 34.8 11/05/2019 0144    CBG (last 3)  Recent Labs    11/05/19 0215  GLUCAP 149*    Lab Results  Component Value Date   INR 1.3 (H) 11/05/2019   INR 1.12 05/07/2018   INR 1.29 05/06/2018    Critical care time: 46 minutes  Coralyn Helling, MD [facility] Pulmonary/Critical Care Pager - [phone] 11/05/2019, 10:11 AM

## 2019-11-05 NOTE — ED Provider Notes (Signed)
[facility] DEPT Provider Note: [name], MD, FACEP  CSN: [id number] MRN: [id number] ARRIVAL: 11/05/19 at [place]: [facility]   HISTORY OF PRESENT ILLNESS  11/05/19 1:28 AM Susan [name] is a 39 y.o. female with a history of alcohol abuse.  She states her last drink was over 24 hours ago.  She is here with shortness of breath, nausea and vomiting that began yesterday morning and is progressed throughout the day.  Symptoms are moderate to severe.  On arrival she is tachypneic and noted to be tachycardic.  She is complaining of acute pain in her lower back which she rates as a 7 out of 10, worse with movement.  She has a history of gestational diabetes but is not currently known to be diabetic.  She has no history of Raynaud's syndrome.  CBG was about 150 prior to arrival.  She states she bruises easily.   Past Medical History:  Diagnosis Date  . AKI (acute kidney injury) ([place]) 06/15/2019   admitted with delhydration r/o arf  . Alcohol abuse   . Alcoholic hepatitis without ascites   . Depression   . Family history of adverse reaction to anesthesia    MOTHER HAD PANIC ATTACKS & NAUSEA  AFTER  . Gastritis   . GIB (gastrointestinal bleeding)   . Hypertension   . Iron deficiency anemia   . Pancreatitis   . PONV (postoperative nausea and vomiting)     Past Surgical History:  Procedure Laterality Date  . CERVICAL CERCLAGE Bilateral 01/18/2019   Procedure: CERCLAGE CERVICAL;  Surgeon: Everett Graff, MD;  Location: [facility];  Service: Gynecology;  Laterality: Bilateral;  . DILATATION AND CURETTAGE/HYSTEROSCOPY WITH MINERVA N/A 08/23/2019   Procedure: DILATATION AND CURETTAGE/HYSTEROSCOPY WITH MINERVA;  Surgeon: Waymon Amato, MD;  Location: [facility];  Service: Gynecology;  Laterality: N/A;  rep will be here for this case confirmed on 08/18/19.  . ESOPHAGOGASTRODUODENOSCOPY (EGD) WITH PROPOFOL N/A 06/05/2016   Procedure: ESOPHAGOGASTRODUODENOSCOPY (EGD) WITH PROPOFOL;  Surgeon: Otis Brace, MD;  Location: [facility];  Service: Gastroenterology;  Laterality: N/A;  . PLACEMENT OF BREAST IMPLANTS      Family History  Problem Relation Age of Onset  . Stroke Mother   . Diabetes Maternal Grandmother     Social History   Tobacco Use  . Smoking status: Former Smoker    Packs/day: 0.50    Years: 15.00    Pack years: 7.50    Quit date: 2015    Years since quitting: 6.4  . Smokeless tobacco: Never Used  Substance Use Topics  . Alcohol use: Yes    Comment: occasional, ?heavy intermittent use  . Drug use: No    Prior to Admission medications   Medication Sig Start Date End Date Taking? Authorizing Provider  ibuprofen (ADVIL) 800 MG tablet Take 1 tablet (800 mg total) by mouth every 8 (eight) hours as needed for moderate pain or cramping. Patient not taking: Reported on 11/05/2019 08/23/19   Waymon Amato, MD  ondansetron (ZOFRAN) 4 MG tablet Take 1 tablet (4 mg total) by mouth every 8 (eight) hours as needed for nausea or vomiting. Patient not taking: Reported on 11/05/2019 08/23/19   Waymon Amato, MD    Allergies Penicillins, Effexor [venlafaxine], Latex, and Tape   REVIEW OF SYSTEMS  Negative except as noted here or in the History of Present Illness.   PHYSICAL EXAMINATION  Initial Vital Signs Blood pressure (!) 110/98, pulse Marland Kitchen)  137, resp. rate (!) 30, SpO2 100 %, not currently breastfeeding.  Examination General: Well-developed, well-nourished female; appearance consistent with age of record HENT: normocephalic; atraumatic; breath smells of ketones Eyes: pupils equal, round and reactive to light; extraocular muscles intact Neck: supple Heart: regular rate and rhythm; tachycardia Lungs: clear to auscultation bilaterally; tachypnea Abdomen: soft; nondistended; mild diffuse tenderness most prominent in the right upper quadrant; no masses or hepatosplenomegaly; bowel sounds  present Extremities: No deformity; full range of motion; pulses thready Neurologic: Awake but lethargic; slurred speech; motor function intact in all extremities and symmetric; no facial droop Skin: Warm and dry; tips of fingers and toes pale and cyanotic; multiple ecchymoses of lower extremities, notably around the knees:    Psychiatric: Anxious   RESULTS  Summary of this visit's results, reviewed and interpreted by myself:   EKG Interpretation  Date/Time:  Friday Nov 05 2019 01:31:01 EDT Ventricular Rate:  139 PR Interval:    QRS Duration: 111 QT Interval:  395 QTC Calculation: 601 R Axis:   114 Text Interpretation: Sinus tachycardia Consider right atrial enlargement Right axis deviation Prolonged QT interval Rate is faster, QT longer Artifact Confirmed by Shanon Rosser [phone]) on 11/05/2019 1:48:26 AM      Laboratory Studies: Results for orders placed or performed during the hospital encounter of 11/05/19 (from the past 24 hour(s))  Ammonia     Status: Abnormal   Collection Time: 11/05/19  1:43 AM  Result Value Ref Range   Ammonia 106 (H) 9 - 35 umol/L  Ethanol     Status: Abnormal   Collection Time: 11/05/19  1:44 AM  Result Value Ref Range   Alcohol, Ethyl (B) 166 (H) <10 mg/dL  CBC with Differential/Platelet     Status: Abnormal   Collection Time: 11/05/19  1:44 AM  Result Value Ref Range   WBC 20.2 (H) 4.0 - 10.5 K/uL   RBC 4.48 3.87 - 5.11 MIL/uL   Hemoglobin 14.4 12.0 - 15.0 g/dL   HCT 49.4 (H) 36.0 - 46.0 %   MCV 110.3 (H) 80.0 - 100.0 fL   MCH 32.1 26.0 - 34.0 pg   MCHC 29.1 (L) 30.0 - 36.0 g/dL   RDW 18.8 (H) 11.5 - 15.5 %   Platelets 260 150 - 400 K/uL   nRBC 0.3 (H) 0.0 - 0.2 %   Neutrophils Relative % 87 %   Neutro Abs 17.5 (H) 1.7 - 7.7 K/uL   Lymphocytes Relative 5 %   Lymphs Abs 1.1 0.7 - 4.0 K/uL   Monocytes Relative 7 %   Monocytes Absolute 1.4 (H) 0.1 - 1.0 K/uL   Eosinophils Relative 0 %   Eosinophils Absolute 0.0 0.0 - 0.5 K/uL   Basophils  Relative 0 %   Basophils Absolute 0.1 0.0 - 0.1 K/uL   Immature Granulocytes 1 %   Abs Immature Granulocytes 0.18 (H) 0.00 - 0.07 K/uL   Polychromasia PRESENT   Comprehensive metabolic panel     Status: Abnormal   Collection Time: 11/05/19  1:44 AM  Result Value Ref Range   Sodium 146 (H) 135 - 145 mmol/L   Potassium 5.2 (H) 3.5 - 5.1 mmol/L   Chloride 99 98 - 111 mmol/L   CO2 <7 (L) 22 - 32 mmol/L   Glucose, Bld 177 (H) 70 - 99 mg/dL   BUN 16 6 - 20 mg/dL   Creatinine, Ser 3.11 (H) 0.44 - 1.00 mg/dL   Calcium 8.7 (L) 8.9 - 10.3 mg/dL   Total  Protein 9.6 (H) 6.5 - 8.1 g/dL   Albumin 5.1 (H) 3.5 - 5.0 g/dL   AST 1,067 (H) 15 - 41 U/L   ALT 299 (H) 0 - 44 U/L   Alkaline Phosphatase 131 (H) 38 - 126 U/L   Total Bilirubin 3.3 (H) 0.3 - 1.2 mg/dL   GFR calc non Af Amer 18 (L) >60 mL/min   GFR calc Af Amer 21 (L) >60 mL/min   Anion gap NOT CALCULATED 5 - 15  Lipase, blood     Status: Abnormal   Collection Time: 11/05/19  1:44 AM  Result Value Ref Range   Lipase 1,268 (H) 11 - 51 U/L  Blood gas, venous (at [facility] and [facility], not at [facility])     Status: Abnormal   Collection Time: 11/05/19  1:44 AM  Result Value Ref Range   pH, Ven 6.903 (LL) 7.250 - 7.430   pCO2, Ven 29.4 (L) 44.0 - 60.0 mmHg   pO2, Ven 34.9 32.0 - 45.0 mmHg   Bicarbonate 5.5 (L) 20.0 - 28.0 mmol/L   Acid-base deficit 28.2 (H) 0.0 - 2.0 mmol/L   O2 Saturation 34.8 %   Patient temperature 98.6   Lactic acid, plasma     Status: Abnormal   Collection Time: 11/05/19  1:44 AM  Result Value Ref Range   Lactic Acid, Venous >11.0 (HH) 0.5 - 1.9 mmol/L  Protime-INR     Status: Abnormal   Collection Time: 11/05/19  1:44 AM  Result Value Ref Range   Prothrombin Time 15.6 (H) 11.4 - 15.2 seconds   INR 1.3 (H) 0.8 - 1.2  APTT     Status: None   Collection Time: 11/05/19  1:44 AM  Result Value Ref Range   aPTT 33 24 - 36 seconds  I-Stat Beta hCG blood, ED ([facility], [facility], [facility] only)     Status: None   Collection Time: 11/05/19  1:54 AM    Result Value Ref Range   I-stat hCG, quantitative <5.0 <5 mIU/mL   Comment 3          Magnesium     Status: Abnormal   Collection Time: 11/05/19  1:54 AM  Result Value Ref Range   Magnesium 2.9 (H) 1.7 - 2.4 mg/dL  Phosphorus     Status: Abnormal   Collection Time: 11/05/19  1:54 AM  Result Value Ref Range   Phosphorus 13.5 (H) 2.5 - 4.6 mg/dL  Blood culture (routine x 2)     Status: None (Preliminary result)   Collection Time: 11/05/19  2:02 AM   Specimen: BLOOD LEFT HAND  Result Value Ref Range   Specimen Description      BLOOD LEFT HAND Performed at [facility], [address] [place], [place] [address]    Special Requests      BOTTLES DRAWN AEROBIC AND ANAEROBIC Blood Culture results may not be optimal due to an inadequate volume of blood received in culture bottles Performed at [facility], [place] [address], [place], [place] [address]    Culture PENDING    Report Status PENDING   CBG monitoring, ED     Status: Abnormal   Collection Time: 11/05/19  2:15 AM  Result Value Ref Range   Glucose-Capillary 149 (H) 70 - 99 mg/dL  Urinalysis, Routine w reflex microscopic     Status: Abnormal   Collection Time: 11/05/19  2:22 AM  Result Value Ref Range   Color, Urine AMBER (A) YELLOW   APPearance  HAZY (A) CLEAR   Specific Gravity, Urine 1.017 1.005 - 1.030   pH 5.0 5.0 - 8.0   Glucose, UA NEGATIVE NEGATIVE mg/dL   Hgb urine dipstick MODERATE (A) NEGATIVE   Bilirubin Urine NEGATIVE NEGATIVE   Ketones, ur 20 (A) NEGATIVE mg/dL   Protein, ur >=300 (A) NEGATIVE mg/dL   Nitrite NEGATIVE NEGATIVE   Leukocytes,Ua NEGATIVE NEGATIVE   RBC / HPF 0-5 0 - 5 RBC/hpf   WBC, UA 0-5 0 - 5 WBC/hpf   Bacteria, UA MANY (A) NONE SEEN   Squamous Epithelial / LPF 0-5 0 - 5   Mucus PRESENT    Hyaline Casts, UA PRESENT    Amorphous Crystal PRESENT   Rapid urine drug screen (hospital performed)     Status: None   Collection Time: 11/05/19  2:22 AM  Result Value Ref  Range   Opiates NONE DETECTED NONE DETECTED   Cocaine NONE DETECTED NONE DETECTED   Benzodiazepines NONE DETECTED NONE DETECTED   Amphetamines NONE DETECTED NONE DETECTED   Tetrahydrocannabinol NONE DETECTED NONE DETECTED   Barbiturates NONE DETECTED NONE DETECTED  Blood culture (routine x 2)     Status: None (Preliminary result)   Collection Time: 11/05/19  2:30 AM   Specimen: BLOOD  Result Value Ref Range   Specimen Description      BLOOD SITE NOT SPECIFIED Performed at [facility], [address] [place], [place] [address]    Special Requests      BOTTLES DRAWN AEROBIC AND ANAEROBIC Blood Culture results may not be optimal due to an inadequate volume of blood received in culture bottles Performed at [facility], [place] [address] [place], [place] [address]    Culture PENDING    Report Status PENDING   SARS Coronavirus 2 by RT PCR (hospital order, performed in [facility] hospital lab) Nasopharyngeal Nasopharyngeal Swab     Status: None   Collection Time: 11/05/19  3:05 AM   Specimen: Nasopharyngeal Swab  Result Value Ref Range   SARS Coronavirus 2 NEGATIVE NEGATIVE  Lactic acid, plasma     Status: Abnormal   Collection Time: 11/05/19  4:07 AM  Result Value Ref Range   Lactic Acid, Venous >11.0 (HH) 0.5 - 1.9 mmol/L  Occult bld gastric/duodenum (cup to lab)     Status: Abnormal   Collection Time: 11/05/19  4:24 AM  Result Value Ref Range   pH, Gastric 3    Occult Blood, Gastric POSITIVE (A) NEGATIVE  CBC WITH DIFFERENTIAL     Status: Abnormal   Collection Time: 11/05/19  4:57 AM  Result Value Ref Range   WBC 12.2 (H) 4.0 - 10.5 K/uL   RBC 3.60 (L) 3.87 - 5.11 MIL/uL   Hemoglobin 11.5 (L) 12.0 - 15.0 g/dL   HCT 38.3 36.0 - 46.0 %   MCV 106.4 (H) 80.0 - 100.0 fL   MCH 31.9 26.0 - 34.0 pg   MCHC 30.0 30.0 - 36.0 g/dL   RDW 18.8 (H) 11.5 - 15.5 %   Platelets 149 (L) 150 - 400 K/uL   nRBC 0.2 0.0 - 0.2 %   Neutrophils Relative % 94 %   Neutro  Abs 11.5 (H) 1.7 - 7.7 K/uL   Lymphocytes Relative 3 %   Lymphs Abs 0.4 (L) 0.7 - 4.0 K/uL   Monocytes Relative 2 %   Monocytes Absolute 0.3 0.1 - 1.0 K/uL   Eosinophils Relative 0 %   Eosinophils Absolute 0.0 0.0 - 0.5  K/uL   Basophils Relative 0 %   Basophils Absolute 0.0 0.0 - 0.1 K/uL   Immature Granulocytes 1 %   Abs Immature Granulocytes 0.06 0.00 - 0.07 K/uL   Imaging Studies: DG Chest Port 1 View  Result Date: 11/05/2019 CLINICAL DATA:  Vomiting, shortness of breath EXAM: PORTABLE CHEST 1 VIEW COMPARISON:  Radiograph 06/15/2019 FINDINGS: No consolidation, features of edema, pneumothorax, or effusion. Pulmonary vascularity is normally distributed. The cardiomediastinal contours are unremarkable. No acute osseous or soft tissue abnormality. Telemetry leads and nasal cannula overlie the chest. IMPRESSION: No acute cardiopulmonary abnormality. Electronically Signed   By: Lovena Le M.D.   On: 11/05/2019 02:22    ED COURSE and MDM  Nursing notes, initial and subsequent vitals signs, including pulse oximetry, reviewed and interpreted by myself.  Vitals:   11/05/19 0545 11/05/19 0600 11/05/19 0615 11/05/19 0615  BP: (!) 133/100 (!) 130/94 (!) 122/94   Pulse: (!) 118 (!) 123 (!) 126   Resp: (!) 27 (!) 29 (!) 28   Temp:    98.8 F (37.1 C)  TempSrc:    Oral  SpO2: 100% 100% 99%   Weight:      Height:       Medications  sodium bicarbonate 150 mEq in dextrose 5% 1000 mL infusion (has no administration in time range)  docusate sodium (COLACE) capsule 100 mg (has no administration in time range)  polyethylene glycol (MIRALAX / GLYCOLAX) packet 17 g (has no administration in time range)  methylPREDNISolone sodium succinate (SOLU-MEDROL) 40 mg/mL injection 40 mg (40 mg Intravenous Given 11/05/19 0609)  sodium chloride 0.9 % bolus 1,000 mL (0 mLs Intravenous Stopped 11/05/19 0403)  LORazepam (ATIVAN) injection 2 mg (2 mg Intravenous Given 11/05/19 0156)  sodium chloride 0.9 % bolus  1,000 mL (0 mLs Intravenous Stopped 11/05/19 0428)  sodium bicarbonate injection 50 mEq (50 mEq Intravenous Given 11/05/19 0210)  sodium chloride 0.9 % 1,000 mL with thiamine 701 mg, folic acid 1 mg, multivitamins adult 10 mL infusion ( Intravenous New Bag/Given 11/05/19 0317)  piperacillin-tazobactam (ZOSYN) IVPB 3.375 g (0 g Intravenous Stopped 11/05/19 0415)  vancomycin (VANCOREADY) IVPB 1250 mg/250 mL (1,250 mg Intravenous New Bag/Given 11/05/19 0423)  sodium bicarbonate injection 100 mEq (100 mEq Intravenous Given 11/05/19 0501)   1:41 AM Patient vomiting on arrival.  Emesis appears dark suggesting it contains blood.  IV fluid bolus and Ativan ordered, Zofran held due to EKG showing prolonged QT.  Patient's rectal temperature was noted to be 96 9, warming measures initiated.   2:08 AM Blood gas shows a significant metabolic acidosis with respiratory compensation.  This is suspicious for alcoholic ketoacidosis.  IV fluid bolus already initiated and 1 amp bicarb given.   3:05 AM Zosyn and vancomycin per pharmacy consult ordered for possible sepsis.  3:20 AM Will consult pulmonary critical care for admission.  The patient does not appear to need intubation at this point but if her metabolic acidosis persists she may tire out and lose her ability to compensate with hyperventilation.  Differential diagnosis includes alcoholic ketoacidosis, sepsis, complications of alcoholic hepatitis.  If this represents sepsis the source is not clear at this time.   3:28 AM Patient's lipase is elevated consistent with acute pancreatitis.  Pulmonary critical care consulted for admission.  4:49 AM Patient more alert, speech clear.  Blood pressure and tachycardia improving slowly.   PROCEDURES  Procedures  CRITICAL CARE Performed by: Karen Chafe [place] Total critical care time: 75 minutes Critical care time  was exclusive of separately billable procedures and treating other patients. Critical care was necessary to  treat or prevent imminent or life-threatening deterioration. Critical care was time spent personally by me on the following activities: development of treatment plan with patient and/or surrogate as well as nursing, discussions with consultants, evaluation of patient's response to treatment, examination of patient, obtaining history from patient or surrogate, ordering and performing treatments and interventions, ordering and review of laboratory studies, ordering and review of radiographic studies, pulse oximetry and re-evaluation of patient's condition.   ED DIAGNOSES     ICD-10-CM   1. SIRS (systemic inflammatory response syndrome) (HCC)  R65.10   2. SOB (shortness of breath)  R06.02 DG Chest [facility] 1 View    DG Chest [place] 1 View  3. Alcoholic hepatitis without ascites  K70.10   4. Hepatic encephalopathy ([place])  K72.90   5. Alcoholic intoxication with complication ([place])  E70.761   6. AKI (acute kidney injury) ([place])  N17.9   7. Hypothermia, initial encounter  T68.XXXA   8. Macrocytosis without anemia  D75.89   9. Metabolic acidosis  H18.3   10. Nausea and vomiting in adult  R11.2   11. [place]ul respiration  E87.2   12. Alcohol-induced acute pancreatitis, unspecified complication status  U37.35        Taeshawn Helfman, Jenny Reichmann, MD 11/05/19 [phone]

## 2019-11-05 NOTE — ED Notes (Signed)
Bair Hugger removed per pt request. Oral temp improved to 98.8

## 2019-11-05 NOTE — ED Triage Notes (Signed)
Pt sts shortness of breath and vomiting since this morning.

## 2019-11-05 NOTE — ED Notes (Signed)
Patient placed on pure wick °

## 2019-11-05 NOTE — ED Notes (Signed)
Critical care team made aware of patient's high BP, patient vomiting and need for CIWA protocol.  They will be down to assess patient shortly and put in orders.

## 2019-11-05 NOTE — ED Notes (Signed)
MD Sood made aware of CBG 402.

## 2019-11-05 NOTE — Progress Notes (Signed)
ABG now shows alkalosis and CO2 up to 26 on BMET.  Will d/c HCO3 in IV fluid and change to LR at 125 ml/hr.  Will replace K, Mg, Ph.  Coralyn Helling, MD [facility] Pulmonary/Critical Care Pager - [phone] 11/05/2019, 6:55 PM

## 2019-11-06 LAB — COMPREHENSIVE METABOLIC PANEL
ALT: 159 U/L — ABNORMAL HIGH (ref 0–44)
AST: 468 U/L — ABNORMAL HIGH (ref 15–41)
Albumin: 3.4 g/dL — ABNORMAL LOW (ref 3.5–5.0)
Alkaline Phosphatase: 74 U/L (ref 38–126)
Anion gap: 12 (ref 5–15)
BUN: 13 mg/dL (ref 6–20)
CO2: 32 mmol/L (ref 22–32)
Calcium: 7.6 mg/dL — ABNORMAL LOW (ref 8.9–10.3)
Chloride: 94 mmol/L — ABNORMAL LOW (ref 98–111)
Creatinine, Ser: 1.11 mg/dL — ABNORMAL HIGH (ref 0.44–1.00)
GFR calc Af Amer: >60 mL/min (ref >60)
GFR calc non Af Amer: >60 mL/min (ref >60)
Glucose, Bld: 124 mg/dL — ABNORMAL HIGH (ref 70–99)
Potassium: 3.2 mmol/L — ABNORMAL LOW (ref 3.5–5.1)
Sodium: 138 mmol/L (ref 135–145)
Total Bilirubin: 3 mg/dL — ABNORMAL HIGH (ref 0.3–1.2)
Total Protein: 6 g/dL — ABNORMAL LOW (ref 6.5–8.1)

## 2019-11-06 LAB — BLOOD GAS, ARTERIAL
Acid-Base Excess: 10.9 mmol/L — ABNORMAL HIGH (ref 0.0–2.0)
Bicarbonate: 34 mmol/L — ABNORMAL HIGH (ref 20.0–28.0)
O2 Saturation: 95.9 %
Patient temperature: 99.6
pCO2 arterial: 38.4 mmHg (ref 32.0–48.0)
pH, Arterial: 7.558 — ABNORMAL HIGH (ref 7.350–7.450)
pO2, Arterial: 74.1 mmHg — ABNORMAL LOW (ref 83.0–108.0)

## 2019-11-06 LAB — GLUCOSE, CAPILLARY
Glucose-Capillary: 136 mg/dL — ABNORMAL HIGH (ref 70–99)
Glucose-Capillary: 152 mg/dL — ABNORMAL HIGH (ref 70–99)
Glucose-Capillary: 155 mg/dL — ABNORMAL HIGH (ref 70–99)
Glucose-Capillary: 190 mg/dL — ABNORMAL HIGH (ref 70–99)
Glucose-Capillary: 194 mg/dL — ABNORMAL HIGH (ref 70–99)
Glucose-Capillary: 245 mg/dL — ABNORMAL HIGH (ref 70–99)
Glucose-Capillary: 93 mg/dL (ref 70–99)

## 2019-11-06 LAB — PROTIME-INR
INR: 1.1 (ref 0.8–1.2)
Prothrombin Time: 13.6 seconds (ref 11.4–15.2)

## 2019-11-06 LAB — PHOSPHORUS
Phosphorus: 1.5 mg/dL — ABNORMAL LOW (ref 2.5–4.6)
Phosphorus: 2.4 mg/dL — ABNORMAL LOW (ref 2.5–4.6)

## 2019-11-06 LAB — MAGNESIUM: Magnesium: 2.4 mg/dL (ref 1.7–2.4)

## 2019-11-06 MED ORDER — LIP MEDEX EX OINT
TOPICAL_OINTMENT | CUTANEOUS | Status: AC
  Administered 2019-11-06: 1 via TOPICAL
  Filled 2019-11-06: qty 7

## 2019-11-06 MED ORDER — FOLIC ACID 1 MG PO TABS
1.0000 mg | ORAL_TABLET | Freq: Every day | ORAL | Status: DC
  Administered 2019-11-06: 1 mg via ORAL
  Filled 2019-11-06: qty 1

## 2019-11-06 MED ORDER — THIAMINE HCL 100 MG PO TABS
100.0000 mg | ORAL_TABLET | Freq: Every day | ORAL | Status: DC
  Administered 2019-11-06: 100 mg via ORAL
  Filled 2019-11-06: qty 1

## 2019-11-06 MED ORDER — POTASSIUM PHOSPHATES 15 MMOLE/5ML IV SOLN
45.0000 mmol | Freq: Once | INTRAVENOUS | Status: AC
  Administered 2019-11-06: 45 mmol via INTRAVENOUS
  Filled 2019-11-06: qty 15

## 2019-11-06 MED ORDER — PANTOPRAZOLE SODIUM 40 MG PO TBEC
40.0000 mg | DELAYED_RELEASE_TABLET | Freq: Two times a day (BID) | ORAL | Status: DC
  Administered 2019-11-06 – 2019-11-07 (×3): 40 mg via ORAL
  Filled 2019-11-06 (×3): qty 1

## 2019-11-06 MED ORDER — POTASSIUM CHLORIDE CRYS ER 20 MEQ PO TBCR
20.0000 meq | EXTENDED_RELEASE_TABLET | Freq: Once | ORAL | Status: AC
  Administered 2019-11-06: 20 meq via ORAL
  Filled 2019-11-06: qty 1

## 2019-11-06 MED ORDER — LIP MEDEX EX OINT: 1.0000 "application " | TOPICAL_OINTMENT | CUTANEOUS | Status: DC | PRN

## 2019-11-06 MED ORDER — CLONIDINE HCL 0.1 MG PO TABS
0.1000 mg | ORAL_TABLET | Freq: Three times a day (TID) | ORAL | Status: DC | PRN
  Administered 2019-11-07: 0.1 mg via ORAL
  Filled 2019-11-06: qty 1

## 2019-11-06 NOTE — Progress Notes (Addendum)
NAME:  Susan [name], MRN:  [id number], DOB:  July 20, 1980, LOS: 1 ADMISSION DATE:  11/05/2019, CONSULTATION DATE: 11/05/2019 REFERRING MD: ED, CHIEF COMPLAINT: Abdominal pain   History of present illness   39 yo female with hx of ETOH and pancreatitis started drinking again.  Presented to ER on 5/28 with 2 days of epigastric and back pain associated with nausea and vomiting.  In ER found to have acute pancreatitis, alcoholic hepatitis, anion gap acidosis with lactic acidosis, AKI with hyperkalemia.  PCCM consulted to admit to ICU.  Past Medical History  ETOH, Pancreatitis  Significant Events:  5/28 Admit  Consults:    Procedures:    Significant Studies:  CT abd/pelvis 5/28 >> marked fatty liver, mildly atrophic pancreas with mild changes of acute pancreatitis, normal spleen, IUD in uterus  Micro:  Blood x 2  5/28 >> SARS CoV2 5/28 >> negative  Antibiotics:  Vancomycin 5/28 > only  Zosyn 5/28 > only   Subjective:   no pain, no nausea, no sob, desperately wants to go home and requesting food   Objective   Blood pressure 126/73, pulse (!) 112, temperature 99.5 F (37.5 C), temperature source Oral, resp. rate (!) 21, height 5' 3"  (1.6 m), weight 67.9 kg, SpO2 96 %, not currently breastfeeding.        Intake/Output Summary (Last 24 hours) at 11/06/2019 0817 Last data filed at 11/06/2019 0400 Gross per 24 hour  Intake 3979.69 ml  Output --  Net 3979.69 ml   Filed Weights   11/05/19 0127 11/06/19 0500  Weight: 63.5 kg 67.9 kg    Examination: Tmax  99.9  Pt alert, approp nad @ 45 degrees /RA No jvd Oropharynx clear,  mucosa nl Neck supple Lungs clear  bilaterally RRR no s3 or or sign murmur Abd soft/non tender/ nl  excursion  Extr warm with no edema or clubbing noted Neuro  Sensorium intact,  no apparent motor deficits   Assessment & Plan:   Acute alcoholic pancreatitis with acute alcoholic hepatitis. -  Clinically much better and threatening ama per  nursing  >>> see if she can eat and if so may consider early d/c rather than have her leave ama - advance diet/ recheck labs in am and ? D/c then ?   AKI from ATN in setting of pancreatitis. Anion gap metabolic acidosis with lactic acidosis. Hyperkalemia from acidosis. -  Lab Results  Component Value Date   CREATININE 1.11 (H) 11/06/2019   CREATININE 1.71 (H) 11/05/2019   CREATININE 2.07 (H) 11/05/2019  >>> resolved    Steroid induced hyperglycemia. - no hx of DM but did require SSI last admit  - SSI while on steroids but probably ok to d/c s insulin off steroids > with lfts improving will d/c solumedrol p am dose and see where blood sugars go from here    Best Practice:  Diet: try diet  DVT: SQ heparin SUP: Protonix changed to po am 5/29  Mobility: bed rest Goals of care: Full code Disposition: ICU  Labs:   CMP Latest Ref Rng & Units 11/06/2019 11/05/2019 11/05/2019  Glucose 70 - 99 mg/dL 124(H) 371(H) 346(H)  BUN 6 - 20 mg/dL 13 18 15   Creatinine 0.44 - 1.00 mg/dL 1.11(H) 1.71(H) 2.07(H)  Sodium 135 - 145 mmol/L 138 146(H) 146(H)  Potassium 3.5 - 5.1 mmol/L 3.2(L) 3.1(L) 4.8  Chloride 98 - 111 mmol/L 94(L) 99 107  CO2 22 - 32 mmol/L 32 26 13(L)  Calcium 8.9 - 10.3  mg/dL 7.6(L) 7.3(L) 6.5(L)  Total Protein 6.5 - 8.1 g/dL 6.0(L) - 6.2(L)  Total Bilirubin 0.3 - 1.2 mg/dL 3.0(H) - 4.0(H)  Alkaline Phos 38 - 126 U/L 74 - 81  AST 15 - 41 U/L 468(H) - 700(H)  ALT 0 - 44 U/L 159(H) - 197(H)    CBC Latest Ref Rng & Units 11/05/2019 11/05/2019 11/05/2019  WBC 4.0 - 10.5 K/uL 9.3 8.5 12.2(H)  Hemoglobin 12.0 - 15.0 g/dL 11.0(L) 9.4(L) 11.5(L)  Hematocrit 36.0 - 46.0 % 34.0(L) 30.7(L) 38.3  Platelets 150 - 400 K/uL 126(L) 116(L) 149(L)    ABG    Component Value Date/Time   PHART 7.558 (H) 11/06/2019 0425   PCO2ART 38.4 11/06/2019 0425   PO2ART 74.1 (L) 11/06/2019 0425   HCO3 34.0 (H) 11/06/2019 0425   TCO2 18 (L) 06/16/2019 0642   ACIDBASEDEF 28.2 (H) 11/05/2019 0144    O2SAT 95.9 11/06/2019 0425    CBG (last 3)  Recent Labs    11/06/19 0003 11/06/19 0350 11/06/19 0730  GLUCAP 194* 152* 190*    Lab Results  Component Value Date   INR 1.1 11/06/2019   INR 1.3 (H) 11/05/2019   INR 1.12 05/07/2018      Ideally she needs another 24-48 hours in hospital due more to liver issues than pancreatitis but understandably is concerned re her 37 m old child who is safe with her husband but risks marital conflict from what I gather. Case management may be of help here.    The patient is critically ill with multiple organ systems failure and requires high complexity decision making for assessment and support, frequent evaluation and titration of therapies, application of advanced monitoring technologies and extensive interpretation of multiple databases. Critical Care Time devoted to patient care services described in this note is 35 minutes.     Christinia Gully, MD Pulmonary and [place] [phone] After 6:00 PM or weekends, use Beeper [phone]  After 7:00 pm call Elink  [phone]

## 2019-11-06 NOTE — Progress Notes (Signed)
Results for CARRIANNE, HYUN (MRN [id number]) as of 11/06/2019 05:07  Ref. Range 11/06/2019 04:25  pH, Arterial Latest Ref Range: 7.350 - 7.450  7.558 (H)  pCO2 arterial Latest Ref Range: 32.0 - 48.0 mmHg 38.4  pO2, Arterial Latest Ref Range: 83.0 - 108.0 mmHg 74.1 (L)  Acid-Base Excess Latest Ref Range: 0.0 - 2.0 mmol/L 10.9 (H)  Bicarbonate Latest Ref Range: 20.0 - 28.0 mmol/L 34.0 (H)  O2 Saturation Latest Units: % 95.9  Patient temperature Unknown 99.6  Allens test (pass/fail) Latest Ref Range: PASS  PASS  ABG Drawn on Room Air.

## 2019-11-06 NOTE — Progress Notes (Signed)
[facility] Progress Note Patient Name: Susan [name] DOB: 04-28-1981 MRN: [id number]   Date of Service  11/06/2019  HPI/Events of Note  Pt admitted with acute pancreatitis, she wants her diet advanced to regular diet with a serum lipase of > 1000.  eICU Interventions  I asked bedside RN to explain to patient that pancreatitis is likely to flare up if diet is advanced immediately.        [name] Darshana Curnutt 11/06/2019, 4:46 AM

## 2019-11-06 NOTE — Progress Notes (Signed)
[facility] Progress Note Patient Name: Susan [name] DOB: 02-22-1981 MRN: [id number]   Date of Service  11/06/2019  HPI/Events of Note  Notified of hypertension 140/100 HR 86. Patient seen calm and not in distress. Denies pain. Current BP 124/93.  eICU Interventions  Ordered prn clonidine for SBP > 150     Intervention Category Major Interventions: Hypertension - evaluation and management  Judd Lien 11/06/2019, 11:14 PM

## 2019-11-07 DIAGNOSIS — F1010 Alcohol abuse, uncomplicated: Secondary | ICD-10-CM

## 2019-11-07 LAB — COMPREHENSIVE METABOLIC PANEL
ALT: 170 U/L — ABNORMAL HIGH (ref 0–44)
AST: 449 U/L — ABNORMAL HIGH (ref 15–41)
Albumin: 3.4 g/dL — ABNORMAL LOW (ref 3.5–5.0)
Alkaline Phosphatase: 87 U/L (ref 38–126)
Anion gap: 11 (ref 5–15)
BUN: 7 mg/dL (ref 6–20)
CO2: 28 mmol/L (ref 22–32)
Calcium: 8.5 mg/dL — ABNORMAL LOW (ref 8.9–10.3)
Chloride: 103 mmol/L (ref 98–111)
Creatinine, Ser: 0.68 mg/dL (ref 0.44–1.00)
GFR calc Af Amer: >60 mL/min (ref >60)
GFR calc non Af Amer: >60 mL/min (ref >60)
Glucose, Bld: 94 mg/dL (ref 70–99)
Potassium: 3.8 mmol/L (ref 3.5–5.1)
Sodium: 142 mmol/L (ref 135–145)
Total Bilirubin: 2.7 mg/dL — ABNORMAL HIGH (ref 0.3–1.2)
Total Protein: 6.2 g/dL — ABNORMAL LOW (ref 6.5–8.1)

## 2019-11-07 LAB — PHOSPHORUS: Phosphorus: 1 mg/dL — CL (ref 2.5–4.6)

## 2019-11-07 LAB — LIPASE, BLOOD: Lipase: 39 U/L (ref 11–51)

## 2019-11-07 LAB — AMYLASE: Amylase: 51 U/L (ref 28–100)

## 2019-11-07 LAB — GLUCOSE, CAPILLARY
Glucose-Capillary: 89 mg/dL (ref 70–99)
Glucose-Capillary: 148 mg/dL — ABNORMAL HIGH (ref 70–99)

## 2019-11-07 NOTE — Discharge Summary (Addendum)
Physician Discharge Summary         Patient ID: Susan [name] MRN: [id number] DOB/AGE: 11-Aug-1980 39 y.o.  Admit date: 11/05/2019 Discharge date: 11/07/2019    History of present illness   39 yo female with hx of ETOH and pancreatitis started drinking again.  Presented to ER on 5/28 with 2 days of epigastric and back pain associated with nausea and vomiting.  In ER found to have acute pancreatitis, alcoholic hepatitis, anion gap acidosis with lactic acidosis, AKI with hyperkalemia.  PCCM consulted to admit to ICU.  Rapidly improved and req discharge on 5/29 but stayed until 5/30 to be sure she could tol regular diet and pacreatic enzymes/ lfts improved. Agreed to avoid further etoh/ tylenol at d/c.  Past Medical History  ETOH, Pancreatitis  Significant Events:  5/28 Admit   Significant Studies:  CT abd/pelvis 5/28 >> marked fatty liver, mildly atrophic pancreas with mild changes of acute pancreatitis, normal spleen, IUD in uterus  Micro:  Blood x 2  5/28 >> no growth x 3 days SARS CoV2 5/28 >> neg  MRSA PCR  5/28 > neg   Antibiotics:  Vancomycin 5/28 > only  Zosyn 5/28 > only    Acute alcoholic pancreatitis with acute alcoholic hepatitis. -  eating well at d/c regular diet good appetite no abd pain or or nausea with minimal elevation of lipase, amylase and improved SGOT  -  re-check lfts 6/1 planned   AKI from [facility] in setting of pancreatitis. Anion gap metabolic acidosis with lactic acidosis. Hyperkalemia from acidosis.      Lab Results  Component Value Date   CREATININE 1.11 (H) 11/06/2019   CREATININE 1.71 (H) 11/05/2019   CREATININE 2.07 (H) 11/05/2019  >>> resolved    Steroid induced hyperglycemia. - no hx of DM but did require SSI last admit  - SSI while on steroids and FBS on discharge = 148 with minimal SSI needed 5/29  HBP while on LR  - advised avoid salt      Discharge Diagnoses:      Problem List Items Addressed This Visit      Unprioritized   Metabolic acidosis   Alcoholic hepatitis without ascites   AKI (acute kidney injury) ([place])   Acute pancreatitis    Other Visit Diagnoses    SIRS (systemic inflammatory response syndrome) ([place])    -  Primary   SOB (shortness of breath)       Relevant Orders   DG Chest Port 1 View (Completed)   Hepatic encephalopathy ([place])       Alcoholic intoxication with complication ([place])       Hypothermia, initial encounter       Macrocytosis without anemia       Nausea and vomiting in adult       [place] respiration            Discharge summary   See problem oriented discharge summary.    Discharge Plan by Active Problems   LFT's in 48 hours at [facility] lab and f/u prn          Discharge Exam: BP (!) 153/96 (BP Location: Left Arm)   Pulse 87   Temp 99 F (37.2 C) (Oral)   Resp 19   Ht 5' 3"  (1.6 m)   Wt 66.8 kg   SpO2 97%   BMI 26.09 kg/m    Tmax  99.2  Pt alert, approp nad @ 30 degrees/ RA No jvd Oropharynx clear,  mucosa nl Neck supple Lungs clear bilaterally RRR no s3 or or sign murmur Abd soft/ benign   Extr warm with no edema or clubbing noted Neuro  Sensorium intact,  no apparent motor deficits    Labs at discharge   Lab Results  Component Value Date   CREATININE 0.68 11/07/2019   BUN 7 11/07/2019   NA 142 11/07/2019   K 3.8 11/07/2019   CL 103 11/07/2019   CO2 28 11/07/2019   Lab Results  Component Value Date   WBC 9.3 11/05/2019   HGB 11.0 (L) 11/05/2019   HCT 34.0 (L) 11/05/2019   MCV 98.8 11/05/2019   PLT 126 (L) 11/05/2019   Lab Results  Component Value Date   ALT 170 (H) 11/07/2019   AST 449 (H) 11/07/2019   ALKPHOS 87 11/07/2019   BILITOT 2.7 (H) 11/07/2019   Lab Results  Component Value Date   INR 1.1 11/06/2019   INR 1.3 (H) 11/05/2019   INR 1.12 05/07/2018   Lab Results  Component Value Date   LIPASE 39 11/07/2019     Lab Results  Component Value Date   AMYLASE 51 11/07/2019    Current radiological  studies    No results found.  Disposition:  Home     Discharge Meds: None   Advised no etoh, no tylenol, low salt diet       Follow-up appointment    Will arrange for LFT's recheck 11/09/19 at [facility] office of [place]   Discharge Condition:   Improved  Total time spent on discharg planning and documentation = 40 min    Signed: Christinia Gully 11/07/2019, 7:41 AM

## 2019-11-07 NOTE — Progress Notes (Signed)
Pt DC home/ pt walked out with niece. Pt alert and oriented x 4, vital signs stable upon DC

## 2019-11-07 NOTE — Discharge Instructions (Signed)
Acute Pancreatitis  The pancreas is a gland that is located behind the stomach on the left side of the abdomen. It produces enzymes that help to digest food. The pancreas also releases the hormones glucagon and insulin, which help to regulate blood sugar. Acute pancreatitis happens when inflammation of the pancreas suddenly occurs and the pancreas becomes irritated and swollen. Most acute attacks last a few days and cause serious problems. Some people become dehydrated and develop low blood pressure. In severe cases, bleeding in the abdomen can lead to shock and can be life-threatening. The lungs, heart, and kidneys may fail. What are the causes? This condition may be caused by:  Alcohol abuse.  Drug abuse.  Gallstones or other conditions that can block the tube that drains the pancreas (pancreatic duct).  A tumor in the pancreas. Other causes include:  Certain medicines.  Exposure to certain chemicals.  Diabetes.  An infection in the pancreas.  Damage caused by an accident (trauma).  The poison (venom) from a scorpion bite.  Abdominal surgery.  Autoimmune pancreatitis. This is when the body's disease-fighting (immune) system attacks the pancreas.  Genes that are passed from parent to child (inherited). In some cases, the cause of this condition is not known. What are the signs or symptoms? Symptoms of this condition include:  Pain in the upper abdomen that may radiate to the back. Pain may be severe.  Tenderness and swelling of the abdomen.  Nausea and vomiting.  Fever. How is this diagnosed? This condition may be diagnosed based on:  A physical exam.  Blood tests.  Imaging tests, such as X-rays, CT or MRI scans, or an ultrasound of the abdomen. How is this treated? Treatment for this condition usually requires a stay in the hospital. Treatment for this condition may include:  Pain medicine.  Fluid replacement through an IV.  Placing a tube in the stomach  to remove stomach contents and to control vomiting (NG tube, or nasogastric tube).  Not eating for 3-4 days. This gives the pancreas a rest, because enzymes are not being produced that can cause further damage.  Antibiotic medicines, if your condition is caused by an infection.  Treating any underlying conditions that may be the cause.  Steroid medicines, if your condition is caused by your immune system attacking your body's own tissues (autoimmune disease).  Surgery on the pancreas or gallbladder. Follow these instructions at home: Eating and drinking   Follow instructions from your health care provider about diet. This may involve avoiding alcohol and decreasing the amount of fat in your diet.  Eat smaller, more frequent meals. This reduces the amount of digestive fluids that the pancreas produces.  Drink enough fluid to keep your urine pale yellow.  Do not drink alcohol if it caused your condition. General instructions  Take over-the-counter and prescription medicines only as told by your health care provider.  Do not drive or use heavy machinery while taking prescription pain medicine.  Ask your health care provider if the medicine prescribed to you can cause constipation. You may need to take steps to prevent or treat constipation, such as: ? Take an over-the-counter or prescription medicine for constipation. ? Eat foods that are high in fiber such as whole grains and beans. ? Limit foods that are high in fat and processed sugars, such as fried or sweet foods.  Do not use any products that contain nicotine or tobacco, such as cigarettes, e-cigarettes, and chewing tobacco. If you need help quitting, ask your  health care provider.  Get plenty of rest.  If directed, check your blood sugar at home as told by your health care provider.  Keep all follow-up visits as told by your health care provider. This is important. Contact a health care provider if you:  Do not recover  as quickly as expected.  Develop new or worsening symptoms.  Have persistent pain, weakness, or nausea.  Recover and then have another episode of pain.  Have a fever. Get help right away if:  You cannot eat or keep fluids down.  Your pain becomes severe.  Your skin or the white part of your eyes turns yellow (jaundice).  You have sudden swelling in your abdomen.  You vomit.  You feel dizzy or you faint.  Your blood sugar is high (over 300 mg/dL). Summary  Acute pancreatitis happens when inflammation of the pancreas suddenly occurs and the pancreas becomes irritated and swollen.  This condition is typically caused by alcohol abuse, drug abuse, or gallstones.  Treatment for this condition usually requires a stay in the hospital. This information is not intended to replace advice given to you by your health care provider. Make sure you discuss any questions you have with your health care provider. Document Revised: 03/16/2018 Document Reviewed: 12/01/2017 Elsevier Patient Education  [place].

## 2019-11-09 ENCOUNTER — Telehealth: Payer: Self-pay | Admitting: *Deleted

## 2019-11-09 DIAGNOSIS — N179 Acute kidney failure, unspecified: Secondary | ICD-10-CM

## 2019-11-09 NOTE — Addendum Note (Signed)
Addended by: Rosana Berger on: 11/09/2019 09:50 AM   Modules accepted: Orders

## 2019-11-09 NOTE — Telephone Encounter (Signed)
-----   Message from Tanda Rockers, MD sent at 11/07/2019  7:52 AM EDT ----- Needs to go to [facility] for cmet today or tomorrow

## 2019-11-09 NOTE — Telephone Encounter (Signed)
Tried calling pt- no answer and so LMTCB Lab order already placed

## 2019-11-09 NOTE — Telephone Encounter (Signed)
Per Dr Melvyn Novas add amylase and lipase- added

## 2019-11-09 NOTE — Telephone Encounter (Signed)
Spoke with pt, she is going to come in for lab work tomorrow here at the clinic. Nothing further is needed.

## 2019-11-09 NOTE — Telephone Encounter (Signed)
Pt returning a phone call. Pt can be reached at [phone].

## 2019-11-10 ENCOUNTER — Other Ambulatory Visit (INDEPENDENT_AMBULATORY_CARE_PROVIDER_SITE_OTHER): Payer: Medicaid Other

## 2019-11-10 DIAGNOSIS — N179 Acute kidney failure, unspecified: Secondary | ICD-10-CM | POA: Diagnosis not present

## 2019-11-10 LAB — CULTURE, BLOOD (ROUTINE X 2)
Culture: NO GROWTH
Culture: NO GROWTH

## 2019-11-10 NOTE — Addendum Note (Signed)
Addended by: Suzzanne Cloud E on: 11/10/2019 04:09 PM   Modules accepted: Orders

## 2019-11-11 LAB — COMPREHENSIVE METABOLIC PANEL
ALT: 92 U/L — ABNORMAL HIGH (ref 0–35)
AST: 87 U/L — ABNORMAL HIGH (ref 0–37)
Albumin: 4 g/dL (ref 3.5–5.2)
Alkaline Phosphatase: 92 U/L (ref 39–117)
BUN: 12 mg/dL (ref 6–23)
CO2: 27 mEq/L (ref 19–32)
Calcium: 9.1 mg/dL (ref 8.4–10.5)
Chloride: 99 mEq/L (ref 96–112)
Creatinine, Ser: 0.73 mg/dL (ref 0.40–1.20)
GFR: 88.93 mL/min (ref >60.00)
Glucose, Bld: 195 mg/dL — ABNORMAL HIGH (ref 70–99)
Potassium: 3.8 mEq/L (ref 3.5–5.1)
Sodium: 134 mEq/L — ABNORMAL LOW (ref 135–145)
Total Bilirubin: 1.7 mg/dL — ABNORMAL HIGH (ref 0.2–1.2)
Total Protein: 7 g/dL (ref 6.0–8.3)

## 2019-11-11 LAB — LIPASE: Lipase: 26 U/L (ref 11.0–59.0)

## 2019-11-11 NOTE — Progress Notes (Signed)
Spoke with the pt and notified of  results recs per Dr Melvyn Novas and she verbalized understanding. Was fasting when had these labs. She is asking if Dr Melvyn Novas knows of a PCP for her since she does not have one. Did you want to place referral to someone specific?

## 2019-11-16 ENCOUNTER — Telehealth: Payer: Self-pay | Admitting: Internal Medicine

## 2019-11-16 NOTE — Progress Notes (Signed)
LMTCB

## 2019-11-16 NOTE — Telephone Encounter (Signed)
Spoke with pt. When [place] spoke with her last week, she asked if Dr. Melvyn Novas had a PCP he could recommend for her. Per Dr. Melvyn Novas - any [facility] primary locations are good. Pt has been made aware of this information, she has been given [facility]) information. Nothing further was needed.

## 2019-12-27 ENCOUNTER — Ambulatory Visit: Payer: Medicaid Other | Admitting: Physician Assistant

## 2020-02-17 ENCOUNTER — Inpatient Hospital Stay (HOSPITAL_COMMUNITY)
Admission: EM | Admit: 2020-02-17 | Discharge: 2020-02-19 | DRG: 439 | Disposition: A | Discharge status: Home / Self Care | Payer: Medicaid Other | Attending: Internal Medicine | Admitting: Internal Medicine

## 2020-02-17 ENCOUNTER — Emergency Department (HOSPITAL_COMMUNITY): Payer: Medicaid Other

## 2020-02-17 ENCOUNTER — Encounter (HOSPITAL_COMMUNITY): Payer: Self-pay

## 2020-02-17 ENCOUNTER — Other Ambulatory Visit: Payer: Self-pay

## 2020-02-17 DIAGNOSIS — N179 Acute kidney failure, unspecified: Secondary | ICD-10-CM | POA: Diagnosis present

## 2020-02-17 DIAGNOSIS — Z87891 Personal history of nicotine dependence: Secondary | ICD-10-CM

## 2020-02-17 DIAGNOSIS — R0781 Pleurodynia: Secondary | ICD-10-CM | POA: Diagnosis present

## 2020-02-17 DIAGNOSIS — Z9181 History of falling: Secondary | ICD-10-CM

## 2020-02-17 DIAGNOSIS — Z20822 Contact with and (suspected) exposure to covid-19: Secondary | ICD-10-CM | POA: Diagnosis present

## 2020-02-17 DIAGNOSIS — E8889 Other specified metabolic disorders: Secondary | ICD-10-CM | POA: Diagnosis present

## 2020-02-17 DIAGNOSIS — M545 Low back pain: Secondary | ICD-10-CM | POA: Diagnosis present

## 2020-02-17 DIAGNOSIS — E8729 Other acidosis: Secondary | ICD-10-CM

## 2020-02-17 DIAGNOSIS — F1010 Alcohol abuse, uncomplicated: Secondary | ICD-10-CM | POA: Diagnosis present

## 2020-02-17 DIAGNOSIS — Z833 Family history of diabetes mellitus: Secondary | ICD-10-CM | POA: Diagnosis not present

## 2020-02-17 DIAGNOSIS — K2900 Acute gastritis without bleeding: Secondary | ICD-10-CM | POA: Diagnosis present

## 2020-02-17 DIAGNOSIS — Z823 Family history of stroke: Secondary | ICD-10-CM | POA: Diagnosis not present

## 2020-02-17 DIAGNOSIS — K759 Inflammatory liver disease, unspecified: Secondary | ICD-10-CM

## 2020-02-17 DIAGNOSIS — E872 Acidosis: Secondary | ICD-10-CM | POA: Diagnosis not present

## 2020-02-17 DIAGNOSIS — K852 Alcohol induced acute pancreatitis without necrosis or infection: Secondary | ICD-10-CM | POA: Diagnosis present

## 2020-02-17 DIAGNOSIS — K859 Acute pancreatitis without necrosis or infection, unspecified: Secondary | ICD-10-CM | POA: Diagnosis present

## 2020-02-17 LAB — BLOOD GAS, VENOUS
Acid-base deficit: 10.6 mmol/L — ABNORMAL HIGH (ref 0.0–2.0)
Bicarbonate: 13.6 mmol/L — ABNORMAL LOW (ref 20.0–28.0)
O2 Saturation: 22.6 %
Patient temperature: 98.6
pCO2, Ven: 27 mmHg — ABNORMAL LOW (ref 44.0–60.0)
pH, Ven: 7.324 (ref 7.250–7.430)
pO2, Ven: 24.5 mmHg — CL (ref 32.0–45.0)

## 2020-02-17 LAB — COMPREHENSIVE METABOLIC PANEL
ALT: 153 U/L — ABNORMAL HIGH (ref 0–44)
AST: 439 U/L — ABNORMAL HIGH (ref 15–41)
Albumin: 5 g/dL (ref 3.5–5.0)
Alkaline Phosphatase: 114 U/L (ref 38–126)
Anion gap: 31 — ABNORMAL HIGH (ref 5–15)
BUN: 23 mg/dL — ABNORMAL HIGH (ref 6–20)
CO2: 11 mmol/L — ABNORMAL LOW (ref 22–32)
Calcium: 9.9 mg/dL (ref 8.9–10.3)
Chloride: 96 mmol/L — ABNORMAL LOW (ref 98–111)
Creatinine, Ser: 1.3 mg/dL — ABNORMAL HIGH (ref 0.44–1.00)
GFR calc Af Amer: >60 mL/min (ref >60)
GFR calc non Af Amer: 52 mL/min — ABNORMAL LOW (ref >60)
Glucose, Bld: 150 mg/dL — ABNORMAL HIGH (ref 70–99)
Potassium: 4.5 mmol/L (ref 3.5–5.1)
Sodium: 138 mmol/L (ref 135–145)
Total Bilirubin: 4.5 mg/dL — ABNORMAL HIGH (ref 0.3–1.2)
Total Protein: 9.2 g/dL — ABNORMAL HIGH (ref 6.5–8.1)

## 2020-02-17 LAB — URINALYSIS, ROUTINE W REFLEX MICROSCOPIC
Bilirubin Urine: NEGATIVE
Glucose, UA: NEGATIVE mg/dL
Ketones, ur: 80 mg/dL — AB
Leukocytes,Ua: NEGATIVE
Nitrite: NEGATIVE
Protein, ur: 100 mg/dL — AB
Specific Gravity, Urine: 1.024 (ref 1.005–1.030)
pH: 5 (ref 5.0–8.0)

## 2020-02-17 LAB — CBC
HCT: 42.8 % (ref 36.0–46.0)
Hemoglobin: 14.1 g/dL (ref 12.0–15.0)
MCH: 32.6 pg (ref 26.0–34.0)
MCHC: 32.9 g/dL (ref 30.0–36.0)
MCV: 98.8 fL (ref 80.0–100.0)
Platelets: 213 10*3/uL (ref 150–400)
RBC: 4.33 MIL/uL (ref 3.87–5.11)
RDW: 17.3 % — ABNORMAL HIGH (ref 11.5–15.5)
WBC: 7.2 10*3/uL (ref 4.0–10.5)
nRBC: 0 % (ref 0.0–0.2)

## 2020-02-17 LAB — I-STAT BETA HCG BLOOD, ED (MC, WL, AP ONLY): I-stat hCG, quantitative: <5 m[IU]/mL (ref <5)

## 2020-02-17 LAB — CBG MONITORING, ED: Glucose-Capillary: 161 mg/dL — ABNORMAL HIGH (ref 70–99)

## 2020-02-17 LAB — LIPASE, BLOOD: Lipase: 113 U/L — ABNORMAL HIGH (ref 11–51)

## 2020-02-17 LAB — SARS CORONAVIRUS 2 BY RT PCR (HOSPITAL ORDER, PERFORMED IN ~~LOC~~ HOSPITAL LAB): SARS Coronavirus 2: NEGATIVE

## 2020-02-17 MED ORDER — LACTATED RINGERS IV BOLUS
1000.0000 mL | Freq: Once | INTRAVENOUS | Status: AC
  Administered 2020-02-18: 1000 mL via INTRAVENOUS

## 2020-02-17 MED ORDER — ONDANSETRON HCL 4 MG/2ML IJ SOLN
4.0000 mg | Freq: Once | INTRAMUSCULAR | Status: AC
  Administered 2020-02-17: 4 mg via INTRAVENOUS
  Filled 2020-02-17: qty 2

## 2020-02-17 MED ORDER — SODIUM CHLORIDE 0.9 % IV BOLUS
1000.0000 mL | Freq: Once | INTRAVENOUS | Status: AC
  Administered 2020-02-17: 1000 mL via INTRAVENOUS

## 2020-02-17 MED ORDER — LACTATED RINGERS IV SOLN: INTRAVENOUS | Status: DC

## 2020-02-17 MED ORDER — PANTOPRAZOLE SODIUM 40 MG IV SOLR
40.0000 mg | Freq: Once | INTRAVENOUS | Status: AC
  Administered 2020-02-17: 40 mg via INTRAVENOUS
  Filled 2020-02-17: qty 40

## 2020-02-17 MED ORDER — ONDANSETRON 4 MG PO TBDP
4.0000 mg | ORAL_TABLET | Freq: Once | ORAL | Status: AC | PRN
  Administered 2020-02-17: 4 mg via ORAL
  Filled 2020-02-17: qty 1
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
## 2020-02-17 NOTE — ED Notes (Addendum)
Date and time results received: 02/17/20 2217 (use smartphrase ".now" to insert current time)  Test: VBG (02) Critical Value: 24.5 Name of Provider Notified: Belfi MD Orders Received? Or Actions Taken?: No new orders

## 2020-02-18 ENCOUNTER — Encounter (HOSPITAL_COMMUNITY): Payer: Self-pay | Admitting: Internal Medicine

## 2020-02-18 DIAGNOSIS — E8729 Other acidosis: Secondary | ICD-10-CM | POA: Diagnosis present

## 2020-02-18 DIAGNOSIS — K852 Alcohol induced acute pancreatitis without necrosis or infection: Principal | ICD-10-CM

## 2020-02-18 DIAGNOSIS — N179 Acute kidney failure, unspecified: Secondary | ICD-10-CM

## 2020-02-18 DIAGNOSIS — E872 Acidosis: Secondary | ICD-10-CM

## 2020-02-18 LAB — CBC WITH DIFFERENTIAL/PLATELET
Abs Immature Granulocytes: 0.02 10*3/uL (ref 0.00–0.07)
Basophils Absolute: 0.1 10*3/uL (ref 0.0–0.1)
Basophils Relative: 1 %
Eosinophils Absolute: 0 10*3/uL (ref 0.0–0.5)
Eosinophils Relative: 0 %
HCT: 31.8 % — ABNORMAL LOW (ref 36.0–46.0)
Hemoglobin: 10.2 g/dL — ABNORMAL LOW (ref 12.0–15.0)
Immature Granulocytes: 0 %
Lymphocytes Relative: 16 %
Lymphs Abs: 1.1 10*3/uL (ref 0.7–4.0)
MCH: 31.9 pg (ref 26.0–34.0)
MCHC: 32.1 g/dL (ref 30.0–36.0)
MCV: 99.4 fL (ref 80.0–100.0)
Monocytes Absolute: 0.7 10*3/uL (ref 0.1–1.0)
Monocytes Relative: 10 %
Neutro Abs: 5.2 10*3/uL (ref 1.7–7.7)
Neutrophils Relative %: 73 %
Platelets: 125 10*3/uL — ABNORMAL LOW (ref 150–400)
RBC: 3.2 MIL/uL — ABNORMAL LOW (ref 3.87–5.11)
RDW: 17.6 % — ABNORMAL HIGH (ref 11.5–15.5)
WBC: 7.1 10*3/uL (ref 4.0–10.5)
nRBC: 0 % (ref 0.0–0.2)

## 2020-02-18 LAB — BASIC METABOLIC PANEL
Anion gap: 23 — ABNORMAL HIGH (ref 5–15)
Anion gap: 23 — ABNORMAL HIGH (ref 5–15)
BUN: 26 mg/dL — ABNORMAL HIGH (ref 6–20)
BUN: 27 mg/dL — ABNORMAL HIGH (ref 6–20)
CO2: 13 mmol/L — ABNORMAL LOW (ref 22–32)
CO2: 15 mmol/L — ABNORMAL LOW (ref 22–32)
Calcium: 7.8 mg/dL — ABNORMAL LOW (ref 8.9–10.3)
Calcium: 8.3 mg/dL — ABNORMAL LOW (ref 8.9–10.3)
Chloride: 102 mmol/L (ref 98–111)
Chloride: 104 mmol/L (ref 98–111)
Creatinine, Ser: 1.22 mg/dL — ABNORMAL HIGH (ref 0.44–1.00)
Creatinine, Ser: 1.5 mg/dL — ABNORMAL HIGH (ref 0.44–1.00)
GFR calc Af Amer: 51 mL/min — ABNORMAL LOW (ref >60)
GFR calc Af Amer: >60 mL/min (ref >60)
GFR calc non Af Amer: 44 mL/min — ABNORMAL LOW (ref >60)
GFR calc non Af Amer: 56 mL/min — ABNORMAL LOW (ref >60)
Glucose, Bld: 122 mg/dL — ABNORMAL HIGH (ref 70–99)
Glucose, Bld: 126 mg/dL — ABNORMAL HIGH (ref 70–99)
Potassium: 4.3 mmol/L (ref 3.5–5.1)
Potassium: 4.6 mmol/L (ref 3.5–5.1)
Sodium: 140 mmol/L (ref 135–145)
Sodium: 140 mmol/L (ref 135–145)

## 2020-02-18 LAB — ETHANOL: Alcohol, Ethyl (B): <10 mg/dL (ref <10)

## 2020-02-18 LAB — HEPATIC FUNCTION PANEL
ALT: 106 U/L — ABNORMAL HIGH (ref 0–44)
AST: 295 U/L — ABNORMAL HIGH (ref 15–41)
Albumin: 3.7 g/dL (ref 3.5–5.0)
Alkaline Phosphatase: 68 U/L (ref 38–126)
Bilirubin, Direct: 1.7 mg/dL — ABNORMAL HIGH (ref 0.0–0.2)
Indirect Bilirubin: 2.5 mg/dL — ABNORMAL HIGH (ref 0.3–0.9)
Total Bilirubin: 4.2 mg/dL — ABNORMAL HIGH (ref 0.3–1.2)
Total Protein: 6.3 g/dL — ABNORMAL LOW (ref 6.5–8.1)

## 2020-02-18 LAB — PROTIME-INR
INR: 1.3 — ABNORMAL HIGH (ref 0.8–1.2)
Prothrombin Time: 15.7 seconds — ABNORMAL HIGH (ref 11.4–15.2)

## 2020-02-18 LAB — RAPID URINE DRUG SCREEN, HOSP PERFORMED
Amphetamines: NOT DETECTED
Barbiturates: NOT DETECTED
Benzodiazepines: NOT DETECTED
Cocaine: NOT DETECTED
Opiates: NOT DETECTED
Tetrahydrocannabinol: NOT DETECTED

## 2020-02-18 LAB — CBG MONITORING, ED
Glucose-Capillary: 117 mg/dL — ABNORMAL HIGH (ref 70–99)
Glucose-Capillary: 112 mg/dL — ABNORMAL HIGH (ref 70–99)
Glucose-Capillary: 136 mg/dL — ABNORMAL HIGH (ref 70–99)
Glucose-Capillary: 132 mg/dL — ABNORMAL HIGH (ref 70–99)

## 2020-02-18 LAB — ACETAMINOPHEN LEVEL: Acetaminophen (Tylenol), Serum: <10 ug/mL — ABNORMAL LOW (ref 10–30)

## 2020-02-18 LAB — HEPATITIS PANEL, ACUTE
HCV Ab: NONREACTIVE
Hep A IgM: NONREACTIVE
Hep B C IgM: NONREACTIVE
Hepatitis B Surface Ag: NONREACTIVE

## 2020-02-18 LAB — CK: Total CK: 56 U/L (ref 38–234)

## 2020-02-18 LAB — LACTIC ACID, PLASMA: Lactic Acid, Venous: 1.8 mmol/L (ref 0.5–1.9)

## 2020-02-18 LAB — SALICYLATE LEVEL: Salicylate Lvl: <7 mg/dL — ABNORMAL LOW (ref 7.0–30.0)

## 2020-02-18 LAB — TROPONIN I (HIGH SENSITIVITY): Troponin I (High Sensitivity): 3 ng/L (ref <18)

## 2020-02-18 MED ORDER — ONDANSETRON HCL 4 MG PO TABS: 4.0000 mg | ORAL_TABLET | Freq: Four times a day (QID) | ORAL | Status: DC | PRN

## 2020-02-18 MED ORDER — ONDANSETRON HCL 4 MG/2ML IJ SOLN
4.0000 mg | INTRAMUSCULAR | Status: DC | PRN
  Administered 2020-02-18: 4 mg via INTRAVENOUS
  Filled 2020-02-18: qty 2

## 2020-02-18 MED ORDER — PANTOPRAZOLE SODIUM 40 MG IV SOLR
40.0000 mg | INTRAVENOUS | Status: DC
  Administered 2020-02-18 – 2020-02-19 (×2): 40 mg via INTRAVENOUS
  Filled 2020-02-18 (×2): qty 40

## 2020-02-18 MED ORDER — SODIUM BICARBONATE 8.4 % IV SOLN
INTRAVENOUS | Status: DC
  Filled 2020-02-18 (×3): qty 850

## 2020-02-18 MED ORDER — ONDANSETRON HCL 4 MG/2ML IJ SOLN: 4.0000 mg | Freq: Four times a day (QID) | INTRAMUSCULAR | Status: DC | PRN

## 2020-02-18 MED ORDER — MORPHINE SULFATE (PF) 2 MG/ML IV SOLN
2.0000 mg | INTRAVENOUS | Status: DC | PRN
  Administered 2020-02-18: 2 mg via INTRAVENOUS
  Filled 2020-02-18: qty 1

## 2020-02-18 MED ORDER — INSULIN ASPART 100 UNIT/ML ~~LOC~~ SOLN
0.0000 [IU] | SUBCUTANEOUS | Status: DC
  Filled 2020-02-18: qty 0.06

## 2020-02-18 MED ORDER — THIAMINE HCL 100 MG/ML IJ SOLN
100.0000 mg | Freq: Every day | INTRAMUSCULAR | Status: DC
  Administered 2020-02-18 – 2020-02-19 (×2): 100 mg via INTRAVENOUS
  Filled 2020-02-18 (×3): qty 2
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
## 2020-02-18 NOTE — ED Notes (Signed)
Pt given chicken broth

## 2020-02-18 NOTE — Progress Notes (Signed)
Patient ID: Susan [name], female   DOB: [birth year], 39 y.o.   MRN: [id number] Patient was admitted early this morning for abdominal pain with nausea and vomiting and was found to have elevated LFTs including bilirubin, lipase of 113. She has been started on IV fluids for acute pancreatitis. Patient seen and examined at bedside and plan of care discussed with her. I have reviewed patient's medical records including this morning's H&P, current vitals, labs, medications myself. Most recent bicarb is 64. Will switch to bicarb drip. Repeat a.m. labs. Monitor for alcohol withdrawal.

## 2020-02-19 LAB — GLUCOSE, CAPILLARY
Glucose-Capillary: 104 mg/dL — ABNORMAL HIGH (ref 70–99)
Glucose-Capillary: 146 mg/dL — ABNORMAL HIGH (ref 70–99)

## 2020-02-19 LAB — COMPREHENSIVE METABOLIC PANEL
ALT: 97 U/L — ABNORMAL HIGH (ref 0–44)
AST: 280 U/L — ABNORMAL HIGH (ref 15–41)
Albumin: 3.4 g/dL — ABNORMAL LOW (ref 3.5–5.0)
Alkaline Phosphatase: 75 U/L (ref 38–126)
Anion gap: 13 (ref 5–15)
BUN: 11 mg/dL (ref 6–20)
CO2: 32 mmol/L (ref 22–32)
Calcium: 9 mg/dL (ref 8.9–10.3)
Chloride: 93 mmol/L — ABNORMAL LOW (ref 98–111)
Creatinine, Ser: 0.71 mg/dL (ref 0.44–1.00)
GFR calc Af Amer: >60 mL/min (ref >60)
GFR calc non Af Amer: >60 mL/min (ref >60)
Glucose, Bld: 108 mg/dL — ABNORMAL HIGH (ref 70–99)
Potassium: 3.6 mmol/L (ref 3.5–5.1)
Sodium: 138 mmol/L (ref 135–145)
Total Bilirubin: 3.8 mg/dL — ABNORMAL HIGH (ref 0.3–1.2)
Total Protein: 6 g/dL — ABNORMAL LOW (ref 6.5–8.1)

## 2020-02-19 LAB — CBC WITH DIFFERENTIAL/PLATELET
Abs Immature Granulocytes: 0 10*3/uL (ref 0.00–0.07)
Basophils Absolute: 0.1 10*3/uL (ref 0.0–0.1)
Basophils Relative: 2 %
Eosinophils Absolute: 0.1 10*3/uL (ref 0.0–0.5)
Eosinophils Relative: 2 %
HCT: 33.1 % — ABNORMAL LOW (ref 36.0–46.0)
Hemoglobin: 10.8 g/dL — ABNORMAL LOW (ref 12.0–15.0)
Immature Granulocytes: 0 %
Lymphocytes Relative: 37 %
Lymphs Abs: 1.3 10*3/uL (ref 0.7–4.0)
MCH: 31.8 pg (ref 26.0–34.0)
MCHC: 32.6 g/dL (ref 30.0–36.0)
MCV: 97.4 fL (ref 80.0–100.0)
Monocytes Absolute: 0.3 10*3/uL (ref 0.1–1.0)
Monocytes Relative: 9 %
Neutro Abs: 1.7 10*3/uL (ref 1.7–7.7)
Neutrophils Relative %: 50 %
Platelets: 109 10*3/uL — ABNORMAL LOW (ref 150–400)
RBC: 3.4 MIL/uL — ABNORMAL LOW (ref 3.87–5.11)
RDW: 16.7 % — ABNORMAL HIGH (ref 11.5–15.5)
WBC: 3.4 10*3/uL — ABNORMAL LOW (ref 4.0–10.5)
nRBC: 0 % (ref 0.0–0.2)

## 2020-02-19 LAB — MAGNESIUM: Magnesium: 1.7 mg/dL (ref 1.7–2.4)

## 2020-02-19 MED ORDER — ADULT MULTIVITAMIN W/MINERALS CH: 1.0000 | ORAL_TABLET | Freq: Every day | ORAL | Status: AC

## 2020-02-19 MED ORDER — THIAMINE HCL 100 MG PO TABS: 100.0000 mg | ORAL_TABLET | Freq: Every day | ORAL | 0 refills | Status: DC

## 2020-02-19 MED ORDER — FOLIC ACID 1 MG PO TABS: 1.0000 mg | ORAL_TABLET | Freq: Every day | ORAL | 0 refills | Status: AC

## 2020-02-19 MED ORDER — ONDANSETRON HCL 4 MG PO TABS: 4.0000 mg | ORAL_TABLET | Freq: Four times a day (QID) | ORAL | 0 refills | Status: DC | PRN

## 2020-02-19 MED ORDER — TRAMADOL HCL 50 MG PO TABS: 50.0000 mg | ORAL_TABLET | Freq: Four times a day (QID) | ORAL | 0 refills | Status: DC | PRN

## 2020-02-19 MED ORDER — PANTOPRAZOLE SODIUM 40 MG PO TBEC: 40.0000 mg | DELAYED_RELEASE_TABLET | Freq: Every day | ORAL | 0 refills | Status: DC

## 2020-02-19 NOTE — Discharge Summary (Signed)
Physician Discharge Summary  Susan [name]:[id number] DOB: 08/13/1980 DOA: 02/17/2020  PCP: Cathlean Sauer, MD  Admit date: 02/17/2020 Discharge date: 02/19/2020  Admitted From: Home Disposition: Home  Recommendations for Outpatient Follow-up:  1. Follow up with PCP in 1 week with repeat CMP 2. Abstain from alcohol 3. Follow up in ED if symptoms worsen or new appear   Home Health: No Equipment/Devices: None  Discharge Condition: Stable CODE STATUS: Full Diet recommendation: Regular  Brief/Interim Summary: 39 year old female with history of alcoholic pancreatitis presented with abdominal pain with nausea and vomiting.  In the ED, she had anion gap of 31 with bicarb of 11 with elevated LFTs including bilirubin and lipase of 113 and creatinine of 1.3.  She was started on IV fluids.  During the hospitalization, her condition is improved.  She is not vomiting anymore.  She has tolerated clear liquid diet.  Will advance diet to full liquid and soft diet today and if patient tolerates it, she will be discharged home today.  She really wants to go home today.  Discharge Diagnoses:   Probable alcohol induced acute pancreatitis Probable acute gastritis Ketoacidosis, most likely starvation ketosis Elevated LFTs, likely from alcohol abuse Acute renal failure Alcohol abuse -Patient presented with abdominal pain with nausea and vomiting with elevated LFTs including bilirubin, slightly elevated lipase and creatinine of 1.3 and bicarb of 11 with anion gap of 31.  She was started on IV fluids. -During the hospitalization, her condition is improved.  She is not vomiting anymore.  She has tolerated clear liquid diet.  Will advance diet to full liquid and soft diet today and if patient tolerates it, she will be discharged home today.  She really wants to go home today. -Treated with bicarb drip.  Bicarb is 32 today.  DC IV fluids.  Creatinine is 0.71.  LFTs still elevated but improving.  Outpatient  follow-up of CMP.  Abstain from alcohol. -Outpatient follow-up with PCP  Discharge Instructions  Discharge Instructions    Diet general   Complete by: As directed    Increase activity slowly   Complete by: As directed      Allergies as of 02/19/2020      Reactions   Chlorhexidine Other (See Comments)   Penicillins Nausea And Vomiting   Effexor [venlafaxine] Palpitations, Other (See Comments)   Panic attacks   Latex Rash   Tape irritates the skin/ please use paper tape   Tape Rash   pls use paper tape      Medication List    TAKE these medications   folic acid 1 MG tablet Commonly known as: FOLVITE Take 1 tablet (1 mg total) by mouth daily.   multivitamin with minerals Tabs tablet Take 1 tablet by mouth daily.   ondansetron 4 MG tablet Commonly known as: Zofran Take 1 tablet (4 mg total) by mouth every 6 (six) hours as needed for nausea or vomiting.   pantoprazole 40 MG tablet Commonly known as: Protonix Take 1 tablet (40 mg total) by mouth daily.   thiamine 100 MG tablet Take 1 tablet (100 mg total) by mouth daily.   traMADol 50 MG tablet Commonly known as: Ultram Take 1 tablet (50 mg total) by mouth every 6 (six) hours as needed for severe pain.       Follow-up Information    Cathlean Sauer, MD. Schedule an appointment as soon as possible for a visit in 1 week(s).   Specialty: Family Medicine Why: With repeat CMP Contact information:  [address] [place] [address] [phone]              Allergies  Allergen Reactions  . Chlorhexidine Other (See Comments)  . Penicillins Nausea And Vomiting  . Effexor [Venlafaxine] Palpitations and Other (See Comments)    Panic attacks  . Latex Rash    Tape irritates the skin/ please use paper tape  . Tape Rash    pls use paper tape    Consultations:  None   Procedures/Studies: DG Ribs Unilateral W/Chest Left  Result Date: 02/17/2020 CLINICAL DATA:  Left anterior rib pain after a  fall. EXAM: LEFT RIBS AND CHEST - 3+ VIEW COMPARISON:  Chest 11/05/2019 FINDINGS: Normal heart size and pulmonary vascularity. No focal airspace disease or consolidation in the lungs. No blunting of costophrenic angles. No pneumothorax. Mediastinal contours appear intact. The left ribs appear intact. No acute displaced fractures are identified. No focal bone lesion or bone destruction. IMPRESSION: No evidence of active pulmonary disease.  Negative left ribs. Electronically Signed   By: Lucienne Capers M.D.   On: 02/17/2020 21:56   DG Lumbar Spine Complete  Result Date: 02/17/2020 CLINICAL DATA:  Back pain after a fall. EXAM: LUMBAR SPINE - COMPLETE 4+ VIEW COMPARISON:  CT abdomen and pelvis 11/05/2019 FINDINGS: Five lumbar type vertebral bodies. Mild lumbar scoliosis convex towards the right. No anterior subluxation. Normal alignment of the facet joints. Mild endplate hypertrophic changes at L2-3 and L3-4 levels. No vertebral compression deformities. No focal bone lesions. Bone cortex appears intact. Visualized sacrum appears intact. Intrauterine device projected over the pelvis. IMPRESSION: Mild degenerative changes. No acute displaced fractures. Electronically Signed   By: Lucienne Capers M.D.   On: 02/17/2020 21:54       Subjective: Patient seen and examined at bedside.  She feels much better and has tolerated clear liquid diet.  She denies any current abdominal pain, nausea or vomiting.  She wants to go home.  No overnight fever.  Discharge Exam: Vitals:   02/18/20 2251 02/19/20 0652  BP: (!) 125/93 (!) 138/97  Pulse: 82 78  Resp: 16 16  Temp: 97.9 F (36.6 C) 98.6 F (37 C)  SpO2: 97% 99%    General: Pt is alert, awake, not in acute distress Cardiovascular: rate controlled, S1/S2 + Respiratory: bilateral decreased breath sounds at bases Abdominal: Soft, NT, ND, bowel sounds + Extremities: no edema, no cyanosis    The results of significant diagnostics from this hospitalization  (including imaging, microbiology, ancillary and laboratory) are listed below for reference.     Microbiology: Recent Results (from the past 240 hour(s))  SARS Coronavirus 2 by RT PCR (hospital order, performed in [facility] hospital lab) Nasopharyngeal Nasopharyngeal Swab     Status: None   Collection Time: 02/17/20  9:06 PM   Specimen: Nasopharyngeal Swab  Result Value Ref Range Status   SARS Coronavirus 2 NEGATIVE NEGATIVE Final    Comment: (NOTE) SARS-CoV-2 target nucleic acids are NOT DETECTED.  The SARS-CoV-2 RNA is generally detectable in upper and lower respiratory specimens during the acute phase of infection. The lowest concentration of SARS-CoV-2 viral copies this assay can detect is 250 copies / mL. A negative result does not preclude SARS-CoV-2 infection and should not be used as the sole basis for treatment or other patient management decisions.  A negative result may occur with improper specimen collection / handling, submission of specimen other than nasopharyngeal swab, presence of viral mutation(s) within the areas targeted by  this assay, and inadequate number of viral copies (<250 copies / mL). A negative result must be combined with clinical observations, patient history, and epidemiological information.  Fact Sheet for Patients:   StrictlyIdeas.no  Fact Sheet for Healthcare Providers: BankingDealers.co.za  This test is not yet approved or  cleared by the [place] FDA and has been authorized for detection and/or diagnosis of SARS-CoV-2 by FDA under an Emergency Use Authorization (EUA).  This EUA will remain in effect (meaning this test can be used) for the duration of the COVID-19 declaration under Section 564(b)(1) of the Act, 21 U.S.C. section 360bbb-3(b)(1), unless the authorization is terminated or revoked sooner.  Performed at [facility], [place] [address] [place], [place]  [address]      Labs: BNP (last 3 results) No results for input(s): BNP in the last 8760 hours. Basic Metabolic Panel: Recent Labs  Lab 02/17/20 1540 02/17/20 2359 02/18/20 0421 02/19/20 0600  NA 138 140 140 138  K 4.5 4.6 4.3 3.6  CL 96* 104 102 93*  CO2 11* 13* 15* 32  GLUCOSE 150* 122* 126* 108*  BUN 23* 27* 26* 11  CREATININE 1.30* 1.50* 1.22* 0.71  CALCIUM 9.9 7.8* 8.3* 9.0  MG  --   --   --  1.7   Liver Function Tests: Recent Labs  Lab 02/17/20 1540 02/18/20 0421 02/19/20 0600  AST 439* 295* 280*  ALT 153* 106* 97*  ALKPHOS 114 68 75  BILITOT 4.5* 4.2* 3.8*  PROT 9.2* 6.3* 6.0*  ALBUMIN 5.0 3.7 3.4*   Recent Labs  Lab 02/17/20 1540  LIPASE 113*   No results for input(s): AMMONIA in the last 168 hours. CBC: Recent Labs  Lab 02/17/20 1540 02/18/20 0421 02/19/20 0600  WBC 7.2 7.1 3.4*  NEUTROABS  --  5.2 1.7  HGB 14.1 10.2* 10.8*  HCT 42.8 31.8* 33.1*  MCV 98.8 99.4 97.4  PLT 213 125* 109*   Cardiac Enzymes: Recent Labs  Lab 02/17/20 2359  CKTOTAL 56   BNP: Invalid input(s): POCBNP CBG: Recent Labs  Lab 02/18/20 0820 02/18/20 1141 02/18/20 1543 02/18/20 2248 02/19/20 0809  GLUCAP 112* 136* 132* 104* 146*   D-Dimer No results for input(s): DDIMER in the last 72 hours. Hgb A1c No results for input(s): HGBA1C in the last 72 hours. Lipid Profile No results for input(s): CHOL, HDL, LDLCALC, TRIG, CHOLHDL, LDLDIRECT in the last 72 hours. Thyroid function studies No results for input(s): TSH, T4TOTAL, T3FREE, THYROIDAB in the last 72 hours.  Invalid input(s): FREET3 Anemia work up No results for input(s): VITAMINB12, FOLATE, FERRITIN, TIBC, IRON, RETICCTPCT in the last 72 hours. Urinalysis    Component Value Date/Time   COLORURINE AMBER (A) 02/17/2020 1932   APPEARANCEUR CLOUDY (A) 02/17/2020 1932   LABSPEC 1.024 02/17/2020 1932   PHURINE 5.0 02/17/2020 1932   GLUCOSEU NEGATIVE 02/17/2020 1932   HGBUR SMALL (A) 02/17/2020 1932    BILIRUBINUR NEGATIVE 02/17/2020 1932   KETONESUR 80 (A) 02/17/2020 1932   PROTEINUR 100 (A) 02/17/2020 1932   NITRITE NEGATIVE 02/17/2020 1932   LEUKOCYTESUR NEGATIVE 02/17/2020 1932   Sepsis Labs Invalid input(s): PROCALCITONIN,  WBC,  LACTICIDVEN Microbiology Recent Results (from the past 240 hour(s))  SARS Coronavirus 2 by RT PCR (hospital order, performed in [place] hospital lab) Nasopharyngeal Nasopharyngeal Swab     Status: None   Collection Time: 02/17/20  9:06 PM   Specimen: Nasopharyngeal Swab  Result Value Ref Range Status   SARS Coronavirus 2  NEGATIVE NEGATIVE Final    Comment: (NOTE) SARS-CoV-2 target nucleic acids are NOT DETECTED.  The SARS-CoV-2 RNA is generally detectable in upper and lower respiratory specimens during the acute phase of infection. The lowest concentration of SARS-CoV-2 viral copies this assay can detect is 250 copies / mL. A negative result does not preclude SARS-CoV-2 infection and should not be used as the sole basis for treatment or other patient management decisions.  A negative result may occur with improper specimen collection / handling, submission of specimen other than nasopharyngeal swab, presence of viral mutation(s) within the areas targeted by this assay, and inadequate number of viral copies (<250 copies / mL). A negative result must be combined with clinical observations, patient history, and epidemiological information.  Fact Sheet for Patients:   StrictlyIdeas.no  Fact Sheet for Healthcare Providers: BankingDealers.co.za  This test is not yet approved or  cleared by the [place] FDA and has been authorized for detection and/or diagnosis of SARS-CoV-2 by FDA under an Emergency Use Authorization (EUA).  This EUA will remain in effect (meaning this test can be used) for the duration of the COVID-19 declaration under Section 564(b)(1) of the Act, 21 U.S.C. section  360bbb-3(b)(1), unless the authorization is terminated or revoked sooner.  Performed at [facility], [place] [address] [place], [place] [address]      Time coordinating discharge: 35 minutes  SIGNED:   Aline August, MD  [facility] 02/19/2020, 11:43 AM
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
## 2020-03-20 ENCOUNTER — Other Ambulatory Visit: Payer: Medicaid Other

## 2020-03-20 DIAGNOSIS — Z20822 Contact with and (suspected) exposure to covid-19: Secondary | ICD-10-CM

## 2020-03-21 LAB — NOVEL CORONAVIRUS, NAA: SARS-CoV-2, NAA: NOT DETECTED

## 2020-03-21 LAB — SARS-COV-2, NAA 2 DAY TAT

## 2020-11-13 DIAGNOSIS — F329 Major depressive disorder, single episode, unspecified: Secondary | ICD-10-CM | POA: Insufficient documentation

## 2021-02-13 ENCOUNTER — Other Ambulatory Visit: Payer: Self-pay

## 2021-02-13 ENCOUNTER — Inpatient Hospital Stay: Payer: Self-pay

## 2021-02-13 ENCOUNTER — Inpatient Hospital Stay (HOSPITAL_COMMUNITY): Payer: Medicaid Other

## 2021-02-13 ENCOUNTER — Emergency Department (HOSPITAL_COMMUNITY): Payer: Medicaid Other

## 2021-02-13 ENCOUNTER — Inpatient Hospital Stay (HOSPITAL_COMMUNITY)
Admission: EM | Admit: 2021-02-13 | Discharge: 2021-02-20 | DRG: 224 | Disposition: A | Discharge status: Home / Self Care | Payer: Medicaid Other | Attending: Internal Medicine | Admitting: Internal Medicine

## 2021-02-13 DIAGNOSIS — D539 Nutritional anemia, unspecified: Secondary | ICD-10-CM | POA: Diagnosis present

## 2021-02-13 DIAGNOSIS — I462 Cardiac arrest due to underlying cardiac condition: Secondary | ICD-10-CM | POA: Diagnosis present

## 2021-02-13 DIAGNOSIS — I5021 Acute systolic (congestive) heart failure: Secondary | ICD-10-CM | POA: Diagnosis present

## 2021-02-13 DIAGNOSIS — Z7141 Alcohol abuse counseling and surveillance of alcoholic: Secondary | ICD-10-CM

## 2021-02-13 DIAGNOSIS — F1010 Alcohol abuse, uncomplicated: Secondary | ICD-10-CM | POA: Diagnosis present

## 2021-02-13 DIAGNOSIS — Z959 Presence of cardiac and vascular implant and graft, unspecified: Secondary | ICD-10-CM

## 2021-02-13 DIAGNOSIS — F329 Major depressive disorder, single episode, unspecified: Secondary | ICD-10-CM | POA: Diagnosis present

## 2021-02-13 DIAGNOSIS — I4901 Ventricular fibrillation: Secondary | ICD-10-CM | POA: Diagnosis present

## 2021-02-13 DIAGNOSIS — I469 Cardiac arrest, cause unspecified: Secondary | ICD-10-CM | POA: Diagnosis present

## 2021-02-13 DIAGNOSIS — Z87891 Personal history of nicotine dependence: Secondary | ICD-10-CM

## 2021-02-13 DIAGNOSIS — I472 Ventricular tachycardia: Secondary | ICD-10-CM | POA: Diagnosis not present

## 2021-02-13 DIAGNOSIS — R7401 Elevation of levels of liver transaminase levels: Secondary | ICD-10-CM | POA: Diagnosis not present

## 2021-02-13 DIAGNOSIS — J9601 Acute respiratory failure with hypoxia: Secondary | ICD-10-CM | POA: Diagnosis present

## 2021-02-13 DIAGNOSIS — G9340 Encephalopathy, unspecified: Secondary | ICD-10-CM | POA: Diagnosis present

## 2021-02-13 DIAGNOSIS — U071 COVID-19: Secondary | ICD-10-CM | POA: Diagnosis present

## 2021-02-13 DIAGNOSIS — E872 Acidosis: Secondary | ICD-10-CM | POA: Diagnosis present

## 2021-02-13 DIAGNOSIS — K7010 Alcoholic hepatitis without ascites: Secondary | ICD-10-CM | POA: Diagnosis present

## 2021-02-13 DIAGNOSIS — R570 Cardiogenic shock: Secondary | ICD-10-CM | POA: Diagnosis present

## 2021-02-13 DIAGNOSIS — I428 Other cardiomyopathies: Secondary | ICD-10-CM | POA: Diagnosis present

## 2021-02-13 DIAGNOSIS — I10 Essential (primary) hypertension: Secondary | ICD-10-CM | POA: Diagnosis present

## 2021-02-13 DIAGNOSIS — Z789 Other specified health status: Secondary | ICD-10-CM

## 2021-02-13 DIAGNOSIS — J690 Pneumonitis due to inhalation of food and vomit: Secondary | ICD-10-CM | POA: Diagnosis present

## 2021-02-13 DIAGNOSIS — I110 Hypertensive heart disease with heart failure: Secondary | ICD-10-CM | POA: Diagnosis present

## 2021-02-13 DIAGNOSIS — I447 Left bundle-branch block, unspecified: Secondary | ICD-10-CM | POA: Diagnosis present

## 2021-02-13 DIAGNOSIS — E1165 Type 2 diabetes mellitus with hyperglycemia: Secondary | ICD-10-CM | POA: Diagnosis present

## 2021-02-13 DIAGNOSIS — Z7289 Other problems related to lifestyle: Secondary | ICD-10-CM

## 2021-02-13 DIAGNOSIS — Y905 Blood alcohol level of 100-119 mg/100 ml: Secondary | ICD-10-CM | POA: Diagnosis present

## 2021-02-13 DIAGNOSIS — E876 Hypokalemia: Secondary | ICD-10-CM | POA: Diagnosis present

## 2021-02-13 DIAGNOSIS — Z23 Encounter for immunization: Secondary | ICD-10-CM

## 2021-02-13 DIAGNOSIS — Z91048 Other nonmedicinal substance allergy status: Secondary | ICD-10-CM

## 2021-02-13 DIAGNOSIS — K7200 Acute and subacute hepatic failure without coma: Secondary | ICD-10-CM | POA: Diagnosis present

## 2021-02-13 DIAGNOSIS — F10229 Alcohol dependence with intoxication, unspecified: Secondary | ICD-10-CM | POA: Diagnosis present

## 2021-02-13 DIAGNOSIS — F32A Depression, unspecified: Secondary | ICD-10-CM | POA: Diagnosis present

## 2021-02-13 DIAGNOSIS — J1282 Pneumonia due to coronavirus disease 2019: Secondary | ICD-10-CM | POA: Diagnosis present

## 2021-02-13 DIAGNOSIS — R569 Unspecified convulsions: Secondary | ICD-10-CM

## 2021-02-13 DIAGNOSIS — Z886 Allergy status to analgesic agent status: Secondary | ICD-10-CM

## 2021-02-13 DIAGNOSIS — Z91040 Latex allergy status: Secondary | ICD-10-CM

## 2021-02-13 DIAGNOSIS — Z79899 Other long term (current) drug therapy: Secondary | ICD-10-CM

## 2021-02-13 DIAGNOSIS — E871 Hypo-osmolality and hyponatremia: Secondary | ICD-10-CM | POA: Diagnosis present

## 2021-02-13 DIAGNOSIS — G9341 Metabolic encephalopathy: Secondary | ICD-10-CM | POA: Diagnosis not present

## 2021-02-13 DIAGNOSIS — Z833 Family history of diabetes mellitus: Secondary | ICD-10-CM

## 2021-02-13 DIAGNOSIS — I4581 Long QT syndrome: Secondary | ICD-10-CM | POA: Diagnosis not present

## 2021-02-13 DIAGNOSIS — I959 Hypotension, unspecified: Secondary | ICD-10-CM | POA: Diagnosis present

## 2021-02-13 LAB — URINALYSIS, ROUTINE W REFLEX MICROSCOPIC
Bilirubin Urine: NEGATIVE
Glucose, UA: 150 mg/dL — AB
Ketones, ur: NEGATIVE mg/dL
Leukocytes,Ua: NEGATIVE
Nitrite: NEGATIVE
Protein, ur: 100 mg/dL — AB
Specific Gravity, Urine: 1.016 (ref 1.005–1.030)
pH: 6 (ref 5.0–8.0)

## 2021-02-13 LAB — ACETAMINOPHEN LEVEL: Acetaminophen (Tylenol), Serum: <10 ug/mL — ABNORMAL LOW (ref 10–30)

## 2021-02-13 LAB — RAPID URINE DRUG SCREEN, HOSP PERFORMED
Amphetamines: NOT DETECTED
Barbiturates: NOT DETECTED
Benzodiazepines: POSITIVE — AB
Cocaine: NOT DETECTED
Opiates: NOT DETECTED
Tetrahydrocannabinol: NOT DETECTED

## 2021-02-13 LAB — COMPREHENSIVE METABOLIC PANEL
ALT: 58 U/L — ABNORMAL HIGH (ref 0–44)
AST: 307 U/L — ABNORMAL HIGH (ref 15–41)
Albumin: 3 g/dL — ABNORMAL LOW (ref 3.5–5.0)
Alkaline Phosphatase: 92 U/L (ref 38–126)
Anion gap: 20 — ABNORMAL HIGH (ref 5–15)
BUN: 6 mg/dL (ref 6–20)
CO2: 13 mmol/L — ABNORMAL LOW (ref 22–32)
Calcium: 7.6 mg/dL — ABNORMAL LOW (ref 8.9–10.3)
Chloride: 99 mmol/L (ref 98–111)
Creatinine, Ser: 0.76 mg/dL (ref 0.44–1.00)
GFR, Estimated: >60 mL/min (ref >60)
Glucose, Bld: 242 mg/dL — ABNORMAL HIGH (ref 70–99)
Potassium: 3 mmol/L — ABNORMAL LOW (ref 3.5–5.1)
Sodium: 132 mmol/L — ABNORMAL LOW (ref 135–145)
Total Bilirubin: 0.8 mg/dL (ref 0.3–1.2)
Total Protein: 6.3 g/dL — ABNORMAL LOW (ref 6.5–8.1)

## 2021-02-13 LAB — GLUCOSE, CAPILLARY: Glucose-Capillary: 138 mg/dL — ABNORMAL HIGH (ref 70–99)

## 2021-02-13 LAB — I-STAT ARTERIAL BLOOD GAS, ED
Acid-base deficit: 14 mmol/L — ABNORMAL HIGH (ref 0.0–2.0)
Bicarbonate: 13.1 mmol/L — ABNORMAL LOW (ref 20.0–28.0)
Calcium, Ion: 0.97 mmol/L — ABNORMAL LOW (ref 1.15–1.40)
HCT: 38 % (ref 36.0–46.0)
Hemoglobin: 12.9 g/dL (ref 12.0–15.0)
O2 Saturation: 100 %
Potassium: 2.4 mmol/L — CL (ref 3.5–5.1)
Sodium: 132 mmol/L — ABNORMAL LOW (ref 135–145)
TCO2: 14 mmol/L — ABNORMAL LOW (ref 22–32)
pCO2 arterial: 32.4 mmHg (ref 32.0–48.0)
pH, Arterial: 7.213 — ABNORMAL LOW (ref 7.350–7.450)
pO2, Arterial: 447 mmHg — ABNORMAL HIGH (ref 83.0–108.0)

## 2021-02-13 LAB — MAGNESIUM: Magnesium: 1.7 mg/dL (ref 1.7–2.4)

## 2021-02-13 LAB — CBC
HCT: 41.5 % (ref 36.0–46.0)
Hemoglobin: 13.3 g/dL (ref 12.0–15.0)
MCH: 35.1 pg — ABNORMAL HIGH (ref 26.0–34.0)
MCHC: 32 g/dL (ref 30.0–36.0)
MCV: 109.5 fL — ABNORMAL HIGH (ref 80.0–100.0)
Platelets: 240 10*3/uL (ref 150–400)
RBC: 3.79 MIL/uL — ABNORMAL LOW (ref 3.87–5.11)
RDW: 12.5 % (ref 11.5–15.5)
WBC: 8.1 10*3/uL (ref 4.0–10.5)
nRBC: 0.6 % — ABNORMAL HIGH (ref 0.0–0.2)

## 2021-02-13 LAB — I-STAT CHEM 8, ED
BUN: 4 mg/dL — ABNORMAL LOW (ref 6–20)
Calcium, Ion: 0.83 mmol/L — CL (ref 1.15–1.40)
Chloride: 100 mmol/L (ref 98–111)
Creatinine, Ser: 0.7 mg/dL (ref 0.44–1.00)
Glucose, Bld: 241 mg/dL — ABNORMAL HIGH (ref 70–99)
HCT: 43 % (ref 36.0–46.0)
Hemoglobin: 14.6 g/dL (ref 12.0–15.0)
Potassium: 3 mmol/L — ABNORMAL LOW (ref 3.5–5.1)
Sodium: 134 mmol/L — ABNORMAL LOW (ref 135–145)
TCO2: 16 mmol/L — ABNORMAL LOW (ref 22–32)

## 2021-02-13 LAB — LIPASE, BLOOD: Lipase: 20 U/L (ref 11–51)

## 2021-02-13 LAB — I-STAT BETA HCG BLOOD, ED (MC, WL, AP ONLY): I-stat hCG, quantitative: <5 m[IU]/mL (ref <5)

## 2021-02-13 LAB — ETHANOL: Alcohol, Ethyl (B): 104 mg/dL — ABNORMAL HIGH (ref <10)

## 2021-02-13 LAB — RESP PANEL BY RT-PCR (FLU A&B, COVID) ARPGX2
Influenza A by PCR: NEGATIVE
Influenza B by PCR: NEGATIVE
SARS Coronavirus 2 by RT PCR: POSITIVE — AB

## 2021-02-13 LAB — AMMONIA: Ammonia: 59 umol/L — ABNORMAL HIGH (ref 9–35)

## 2021-02-13 LAB — HIV ANTIBODY (ROUTINE TESTING W REFLEX): HIV Screen 4th Generation wRfx: NONREACTIVE

## 2021-02-13 LAB — SALICYLATE LEVEL: Salicylate Lvl: <7 mg/dL — ABNORMAL LOW (ref 7.0–30.0)

## 2021-02-13 MED ORDER — ACETAMINOPHEN 650 MG RE SUPP: 650.0000 mg | RECTAL | Status: DC

## 2021-02-13 MED ORDER — FENTANYL CITRATE PF 50 MCG/ML IJ SOSY
50.0000 ug | PREFILLED_SYRINGE | Freq: Once | INTRAMUSCULAR | Status: AC
  Administered 2021-02-13: 50 ug via INTRAVENOUS

## 2021-02-13 MED ORDER — ADULT MULTIVITAMIN LIQUID CH
15.0000 mL | Freq: Every day | ORAL | Status: DC
  Administered 2021-02-13: 15 mL via ORAL
  Filled 2021-02-13 (×2): qty 15

## 2021-02-13 MED ORDER — MIDAZOLAM HCL 2 MG/2ML IJ SOLN
INTRAMUSCULAR | Status: AC
  Administered 2021-02-13: 2 mg via INTRAVENOUS
  Filled 2021-02-13: qty 2

## 2021-02-13 MED ORDER — HEPARIN SODIUM (PORCINE) 5000 UNIT/ML IJ SOLN
5000.0000 [IU] | Freq: Three times a day (TID) | INTRAMUSCULAR | Status: DC
  Administered 2021-02-13 – 2021-02-14 (×3): 5000 [IU] via SUBCUTANEOUS
  Filled 2021-02-13 (×3): qty 1

## 2021-02-13 MED ORDER — BUSPIRONE HCL 15 MG PO TABS
30.0000 mg | ORAL_TABLET | Freq: Three times a day (TID) | ORAL | Status: DC
  Filled 2021-02-13: qty 2

## 2021-02-13 MED ORDER — MAGNESIUM SULFATE 2 GM/50ML IV SOLN
2.0000 g | Freq: Once | INTRAVENOUS | Status: AC
  Administered 2021-02-13: 2 g via INTRAVENOUS
  Filled 2021-02-13: qty 50

## 2021-02-13 MED ORDER — SUCCINYLCHOLINE CHLORIDE 20 MG/ML IJ SOLN
INTRAMUSCULAR | Status: DC | PRN
  Administered 2021-02-13: 120 mg via INTRAVENOUS

## 2021-02-13 MED ORDER — INSULIN ASPART 100 UNIT/ML IJ SOLN
2.0000 [IU] | INTRAMUSCULAR | Status: DC
  Administered 2021-02-13: 2 [IU] via SUBCUTANEOUS
  Administered 2021-02-14: 6 [IU] via SUBCUTANEOUS
  Administered 2021-02-14: 2 [IU] via SUBCUTANEOUS
  Administered 2021-02-14: 6 [IU] via SUBCUTANEOUS
  Administered 2021-02-15: 2 [IU] via SUBCUTANEOUS

## 2021-02-13 MED ORDER — MIDAZOLAM HCL 2 MG/2ML IJ SOLN
INTRAMUSCULAR | Status: AC
  Administered 2021-02-13: 2 mg
  Filled 2021-02-13: qty 2

## 2021-02-13 MED ORDER — FENTANYL CITRATE (PF) 100 MCG/2ML IJ SOLN
INTRAMUSCULAR | Status: AC
  Administered 2021-02-13: 50 ug
  Filled 2021-02-13: qty 2

## 2021-02-13 MED ORDER — SODIUM CHLORIDE 0.9 % IV SOLN
2.0000 g | INTRAVENOUS | Status: AC
  Administered 2021-02-13 – 2021-02-17 (×5): 2 g via INTRAVENOUS
  Filled 2021-02-13 (×5): qty 20

## 2021-02-13 MED ORDER — LEVETIRACETAM IN NACL 1000 MG/100ML IV SOLN
1000.0000 mg | Freq: Two times a day (BID) | INTRAVENOUS | Status: DC
  Administered 2021-02-14 – 2021-02-18 (×10): 1000 mg via INTRAVENOUS
  Filled 2021-02-13 (×10): qty 100

## 2021-02-13 MED ORDER — POLYETHYLENE GLYCOL 3350 17 G PO PACK: 17.0000 g | PACK | Freq: Every day | ORAL | Status: DC

## 2021-02-13 MED ORDER — MIDAZOLAM HCL 2 MG/2ML IJ SOLN
2.0000 mg | INTRAMUSCULAR | Status: DC | PRN
  Administered 2021-02-14 (×2): 2 mg via INTRAVENOUS
  Filled 2021-02-13 (×2): qty 2

## 2021-02-13 MED ORDER — POTASSIUM CHLORIDE 10 MEQ/100ML IV SOLN
10.0000 meq | INTRAVENOUS | Status: AC
  Administered 2021-02-13: 10 meq via INTRAVENOUS
  Filled 2021-02-13 (×2): qty 100

## 2021-02-13 MED ORDER — FENTANYL 2500MCG IN NS 250ML (10MCG/ML) PREMIX INFUSION
50.0000 ug/h | INTRAVENOUS | Status: DC
  Administered 2021-02-13: 100 ug/h via INTRAVENOUS
  Administered 2021-02-14: 200 ug/h via INTRAVENOUS
  Filled 2021-02-13 (×2): qty 250

## 2021-02-13 MED ORDER — LORAZEPAM 2 MG/ML IJ SOLN: 2.0000 mg | INTRAMUSCULAR | Status: DC | PRN

## 2021-02-13 MED ORDER — THIAMINE HCL 100 MG/ML IJ SOLN
100.0000 mg | Freq: Every day | INTRAMUSCULAR | Status: DC
  Administered 2021-02-13 – 2021-02-19 (×7): 100 mg via INTRAVENOUS
  Filled 2021-02-13 (×7): qty 2

## 2021-02-13 MED ORDER — PROPOFOL 1000 MG/100ML IV EMUL
5.0000 ug/kg/min | INTRAVENOUS | Status: DC
Start: 2021-02-13 — End: 2021-02-14
  Administered 2021-02-13: 20 ug/kg/min via INTRAVENOUS
  Administered 2021-02-14 (×2): 50 ug/kg/min via INTRAVENOUS
  Filled 2021-02-13: qty 1000
  Filled 2021-02-13 (×2): qty 100

## 2021-02-13 MED ORDER — ACETAMINOPHEN 325 MG PO TABS: 650.0000 mg | ORAL_TABLET | ORAL | Status: DC

## 2021-02-13 MED ORDER — PROPOFOL 1000 MG/100ML IV EMUL
INTRAVENOUS | Status: AC
  Administered 2021-02-13: 10 ug/kg/min via INTRAVENOUS
  Filled 2021-02-13: qty 100

## 2021-02-13 MED ORDER — NOREPINEPHRINE 4 MG/250ML-% IV SOLN
2.0000 ug/min | INTRAVENOUS | Status: DC
  Administered 2021-02-13: 20 ug/min via INTRAVENOUS
  Administered 2021-02-13: 10 ug/min via INTRAVENOUS
  Administered 2021-02-14: 5 ug/min via INTRAVENOUS
  Administered 2021-02-14: 10 ug/min via INTRAVENOUS
  Filled 2021-02-13 (×3): qty 250

## 2021-02-13 MED ORDER — FENTANYL CITRATE (PF) 100 MCG/2ML IJ SOLN
INTRAMUSCULAR | Status: AC
  Filled 2021-02-13: qty 2

## 2021-02-13 MED ORDER — PROPOFOL 1000 MG/100ML IV EMUL: 5.0000 ug/kg/min | INTRAVENOUS | Status: DC

## 2021-02-13 MED ORDER — LORAZEPAM 2 MG/ML IJ SOLN
INTRAMUSCULAR | Status: AC
  Administered 2021-02-13: 1 mg
  Filled 2021-02-13: qty 1

## 2021-02-13 MED ORDER — PANTOPRAZOLE SODIUM 40 MG PO PACK
40.0000 mg | PACK | Freq: Every day | ORAL | Status: DC
  Administered 2021-02-13 – 2021-02-14 (×2): 40 mg
  Filled 2021-02-13 (×2): qty 20

## 2021-02-13 MED ORDER — POTASSIUM CHLORIDE 20 MEQ PO PACK
40.0000 meq | PACK | ORAL | Status: AC
  Administered 2021-02-13 (×2): 40 meq
  Filled 2021-02-13 (×2): qty 2

## 2021-02-13 MED ORDER — PROPOFOL 1000 MG/100ML IV EMUL
5.0000 ug/kg/min | INTRAVENOUS | Status: DC
  Administered 2021-02-13: 15 ug/kg/min via INTRAVENOUS

## 2021-02-13 MED ORDER — IOHEXOL 350 MG/ML SOLN
100.0000 mL | Freq: Once | INTRAVENOUS | Status: AC | PRN
  Administered 2021-02-13: 100 mL via INTRAVENOUS

## 2021-02-13 MED ORDER — CALCIUM GLUCONATE-NACL 1-0.675 GM/50ML-% IV SOLN
1.0000 g | Freq: Once | INTRAVENOUS | Status: AC
  Administered 2021-02-13: 1000 mg via INTRAVENOUS
  Filled 2021-02-13: qty 50

## 2021-02-13 MED ORDER — ONDANSETRON HCL 4 MG/2ML IJ SOLN
4.0000 mg | Freq: Four times a day (QID) | INTRAMUSCULAR | Status: DC | PRN
  Administered 2021-02-14: 4 mg via INTRAVENOUS
  Filled 2021-02-13: qty 2

## 2021-02-13 MED ORDER — DOCUSATE SODIUM 50 MG/5ML PO LIQD
100.0000 mg | Freq: Two times a day (BID) | ORAL | Status: DC
  Administered 2021-02-13: 100 mg
  Filled 2021-02-13 (×2): qty 10

## 2021-02-13 MED ORDER — SODIUM CHLORIDE 0.9 % IV SOLN
250.0000 mL | INTRAVENOUS | Status: DC
  Administered 2021-02-13: 10 mL/h via INTRAVENOUS
  Administered 2021-02-13: 10 mL via INTRAVENOUS
  Administered 2021-02-14: 10 mL/h via INTRAVENOUS

## 2021-02-13 MED ORDER — METRONIDAZOLE 500 MG PO TABS
500.0000 mg | ORAL_TABLET | Freq: Three times a day (TID) | ORAL | Status: DC
  Administered 2021-02-13 – 2021-02-14 (×2): 500 mg
  Filled 2021-02-13 (×2): qty 1

## 2021-02-13 MED ORDER — SODIUM CHLORIDE 0.9 % IV SOLN
1.0000 mg | Freq: Once | INTRAVENOUS | Status: AC
  Administered 2021-02-13: 1 mg via INTRAVENOUS
  Filled 2021-02-13: qty 0.2

## 2021-02-13 MED ORDER — SODIUM CHLORIDE 0.9 % IV SOLN
20.0000 mg/kg | Freq: Once | INTRAVENOUS | Status: AC
  Administered 2021-02-13: 1110 mg via INTRAVENOUS
  Filled 2021-02-13: qty 11.1

## 2021-02-13 MED ORDER — ACETAMINOPHEN 160 MG/5ML PO SOLN
650.0000 mg | ORAL | Status: DC
  Administered 2021-02-13 – 2021-02-14 (×4): 650 mg
  Filled 2021-02-13 (×4): qty 20.3

## 2021-02-13 MED ORDER — BUSPIRONE HCL 15 MG PO TABS
30.0000 mg | ORAL_TABLET | Freq: Three times a day (TID) | ORAL | Status: DC
  Administered 2021-02-14 (×2): 30 mg
  Filled 2021-02-13 (×2): qty 2

## 2021-02-13 MED ORDER — FENTANYL BOLUS VIA INFUSION
50.0000 ug | INTRAVENOUS | Status: DC | PRN
  Filled 2021-02-13: qty 100

## 2021-02-13 NOTE — Progress Notes (Signed)
[facility] Progress Note Patient Name: Susan [name] DOB: 30-Jan-1981 MRN: [id number]   Date of Service  02/13/2021  HPI/Events of Note  Patient needs multi-lumen central line access for medication administration, to include vasopressors.  eICU Interventions  PICC line ordered.        [name] Chakita Mcgraw 02/13/2021, 8:24 PM
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
## 2021-02-13 NOTE — Progress Notes (Signed)
[facility] Progress Note Patient Name: Susan [name] DOB: May 03, 1981 MRN: [id number]   Date of Service  02/13/2021  HPI/Events of Note  Patient admitted via ED with history of out of hospital cardiac arrest in the context of ongoing Covid 19 pneumonia and profound hypokalemia. There is a question of a seizure preceding of immediately following cardiac arrest, blood ETOH level is 104, and toxicology screen positive only for benzodiazepines which she may have received from EMS or the ED, work up is in progress to determine etiology of cardiac arrest.  eICU Interventions  New Patient Evaluation.        Susan [name] Susan [name] 02/13/2021, 8:11 PM

## 2021-02-13 NOTE — Consult Note (Signed)
Neurology Consultation  Reason for Consult: Seizure-like activity   Referring Physician: Dr. Lake Bells  CC: Intubated with seizure-like activity  History is obtained from: Chart review, unable to obtain from patient due to patient's mental status   HPI: Susan [name] is a 40 y.o. female with a medical history significant for alcohol abuse, alcoholic hepatitis without ascites, alcoholic ketoacidosis, pancreatitis, hypertension, iron deficiency anemia, history of GI bleed, and MDD presented to the ED today via EMS for evaluation of seizure-like activity witnessed at home by patient's fianc.  Her fianc initiated CPR and on EMS arrival, it was reported that she was awake but unclear her level of consciousness at that time. She again had seizure-like activity witnessed by EMS which progressed to cardiac arrest with ventricular fibrillation s/p defibrillation with 10 minutes of PEA prior to ROSC.  She was intubated on arrival to the ED.  While in the ED she witnessed posturing movements lower extremities with a rhythmic right wrist extension sometimes with elevation of the right lower arm from below the elbow with intermittent left arm contraction and elevation below the elbow that is not rhythmic.  During these abnormal movements, she had roving eye movements, constant gag around the ETT, and began leaning leftward. These movements were reported to be aborted with administration of 2 mg of Versed on 2 separate occasions as well as with 27m of Ativan but they persistently returned.  Of note, family members have tested positive for COVID-19, patient lab pending.  Family was not present in room during evaluation for further information regarding last alcohol use, recent illness, substance use, or further collateral information.  ROS: Unable to obtain due to altered mental status.   Past Medical History:  Diagnosis Date   [place]   admitted with delhydration r/o arf    Alcohol abuse    Alcoholic hepatitis without ascites    Depression    Family history of adverse reaction to anesthesia    MOTHER HAD PANIC ATTACKS & NAUSEA  AFTER   Gastritis    GIB (gastrointestinal bleeding)    Hypertension    Iron deficiency anemia    Pancreatitis    PONV (postoperative nausea and vomiting)    Past Surgical History:  Procedure Laterality Date   CERVICAL CERCLAGE Bilateral 01/18/2019   Procedure: CERCLAGE CERVICAL;  Surgeon: REverett Graff MD;  Location: [facility];  Service: Gynecology;  Laterality: Bilateral;   DILATATION AND CURETTAGE/HYSTEROSCOPY WITH MINERVA N/A 08/23/2019   Procedure: DILATATION AND CURETTAGE/HYSTEROSCOPY WITH MINERVA;  Surgeon: KWaymon Amato MD;  [place] [facility]  Service: Gynecology;  Laterality: N/A;  rep will be here for this case confirmed on 08/18/19.   ESOPHAGOGASTRODUODENOSCOPY (EGD) WITH PROPOFOL N/A 06/05/2016   Procedure: ESOPHAGOGASTRODUODENOSCOPY (EGD) WITH PROPOFOL;  Surgeon: POtis Brace MD;  Location: [facility];  Service: Gastroenterology;  Laterality: N/A;   PLACEMENT OF BREAST IMPLANTS     Family History  Problem Relation Age of Onset   Stroke Mother    Diabetes Maternal Grandmother    Social History:   reports that she quit smoking about 7 years ago. She has a 7.50 pack-year smoking history. She has never used smokeless tobacco. She reports current alcohol use. She reports that she does not use drugs.  Medications  Current Facility-Administered Medications:    0.9 %  sodium chloride infusion, 250 mL, Intravenous, Continuous, Eubanks, KDanie Chandler NP   acetaminophen (TYLENOL) tablet 650 mg, 650 mg, Oral, Q4H **OR** acetaminophen (TYLENOL) 160  MG/5ML solution 650 mg, 650 mg, Per Tube, Q4H **OR** acetaminophen (TYLENOL) suppository 650 mg, 650 mg, Rectal, Q4H, Eubanks, Danie Chandler, NP   busPIRone (BUSPAR) tablet 30 mg, 30 mg, Oral, Q8H **OR** busPIRone (BUSPAR) tablet 30 mg, 30 mg, Per Tube, Q8H,  Eubanks, Danie Chandler, NP   cefTRIAXone (ROCEPHIN) 2 g in sodium chloride 0.9 % 100 mL IVPB, 2 g, Intravenous, Q24H, Eubanks, Katalina M, NP   docusate (COLACE) 50 MG/5ML liquid 100 mg, 100 mg, Per Tube, BID, Dewaine Oats, Katalina M, NP   fentaNYL (SUBLIMAZE) 100 MCG/2ML injection, , , ,    fentaNYL (SUBLIMAZE) bolus via infusion 50-100 mcg, 50-100 mcg, Intravenous, Q15 min PRN, Dewaine Oats, Katalina M, NP   fentaNYL 2513mg in NS 2534m(104mml) infusion-PREMIX, 50-200 mcg/hr, Intravenous, Continuous, Eubanks, Katalina M, NP   folic acid 1 mg in sodium chloride 0.9 % 50 mL IVPB, 1 mg, Intravenous, Once, Hoffman, PauSilvestre MomentP   heparin injection 5,000 Units, 5,000 Units, Subcutaneous, Q8H, Eubanks, Katalina M, NP   insulin aspart (novoLOG) injection 2-6 Units, 2-6 Units, Subcutaneous, Q4H, Eubanks, Katalina M, NP   LORazepam (ATIVAN) injection 2 mg, 2 mg, Intravenous, Q5 Min x 3 PRN, HofCorey HaroldP   magnesium sulfate IVPB 2 g 50 mL, 2 g, Intravenous, Once, Last Rate: 100 mL/hr at 02/13/21 1834, 2 g at 02/13/21 1834 **AND** miscellaneous pharmacy monitoring, , , Until Discontinued, Eubanks, Katalina M, NP   metroNIDAZOLE (FLAGYL) tablet 500 mg, 500 mg, Per Tube, Q8H, EubOmar PersonP   multivitamin liquid 15 mL, 15 mL, Oral, Daily, HofCorey HaroldP   norepinephrine (LEVOPHED) 4mg58m 250mL82mmix infusion, 2-10 mcg/min, Intravenous, Titrated, EubanOmar Person Last Rate: 75 mL/hr at 02/13/21 1821, 20 mcg/min at 02/13/21 1821   ondansetron (ZOFRAN) injection 4 mg, 4 mg, Intravenous, Q6H PRN, EubanOmar Person  pantoprazole sodium (PROTONIX) 40 mg oral suspension 40 mg, 40 mg, Per Tube, Daily, Eubanks, Katalina M, NP   polyethylene glycol (MIRALAX / GLYCOLAX) packet 17 g, 17 g, Per Tube, Daily, Eubanks, Katalina M, NP   potassium chloride (KLOR-CON) packet 40 mEq, 40 mEq, Per Tube, Q4H, Eubanks, KatalDanie Chandler  potassium chloride 10 mEq in 100 mL IVPB, 10 mEq, Intravenous, Q1 Hr x  2, Eubanks, Katalina M, NP, Last Rate: 100 mL/hr at 02/13/21 1830, 10 mEq at 02/13/21 1830   propofol (DIPRIVAN) 1000 MG/100ML infusion, 5-50 mcg/kg/min (Order-Specific), Intravenous, Continuous, McQuaid, Douglas B, MD, Last Rate: 7.2 mL/hr at 02/13/21 1810, 20 mcg/kg/min at 02/13/21 1810   succinylcholine (ANECTINE) injection, , Intravenous, PRN, Ray, Pattricia Boss 120 mg at 02/13/21 1655   thiamine (B-1) injection 100 mg, 100 mg, Intravenous, Daily, HoffmCorey Harold Current Outpatient Medications:    Multiple Vitamin (MULTIVITAMIN WITH MINERALS) TABS tablet, Take 1 tablet by mouth daily., Disp: , Rfl:    ondansetron (ZOFRAN) 4 MG tablet, Take 1 tablet (4 mg total) by mouth every 6 (six) hours as needed for nausea or vomiting., Disp: 20 tablet, Rfl: 0   pantoprazole (PROTONIX) 40 MG tablet, Take 1 tablet (40 mg total) by mouth daily., Disp: 30 tablet, Rfl: 0   thiamine 100 MG tablet, Take 1 tablet (100 mg total) by mouth daily., Disp: 30 tablet, Rfl: 0   traMADol (ULTRAM) 50 MG tablet, Take 1 tablet (50 mg total) by mouth every 6 (six) hours as needed for severe pain., Disp: 14 tablet, Rfl: 0  Exam: Current vital signs: BP (!)Marland Kitchen  134/111   Pulse (!) 137   Temp 99 F (37.2 C) (Bladder)   Resp (!) 21   Ht 5' 2"  (1.575 m)   SpO2 92%   BMI 24.76 kg/m  Vital signs in last 24 hours: Temp:  [99 F (37.2 C)] 99 F (37.2 C) (09/06 1811) Pulse Rate:  [105-137] 137 (09/06 1810) Resp:  [19-25] 21 (09/06 1810) BP: (99-135)/(80-111) 134/111 (09/06 1810) SpO2:  [92 %-100 %] 92 % (09/06 1824) FiO2 (%):  [40 %-100 %] 40 % (09/06 1748)  GENERAL: Intubated and sedated in the ER, appears critically ill Psych: Unable to assess due to patient's condition Head: Normocephalic and atraumatic, without obvious abnormality EENT: Normal conjunctivae, dry mucous membranes, oral ETT secured midline, OGT secured to ETT LUNGS: Respirations supported via mechanical ventilation, she has spontaneous respirations  over set ventilator rate CV: Tachycardic on cardiac monitor, extremities warm, well perfused.  ABDOMEN: Soft, non-distended Extremities: warm, well perfused, without obvious deformity  NEURO:  Mental Status: Intubated and sedated, does not follow commands, does not open eyes Initially she did not have any response to pain, however, on reassessment, her bilateral lower extremities withdraw to noxious stimuli R > L. Cranial Nerves:  II: PERRL 5 mm/brisk.  III, IV, VI: Roving eye movements, she does not fixate or track examiner V: Corneal reflexes are not intact VII: Unable to assess due to patient ETT in place, does not grimace VIII: Unable to assess due to patient condition IX, X: Cough and gag present XI: Head is grossly midline XII: Does not protrude tongue to command  Motor:  Initially posturing movements of bilateral lower extremities without response to noxious stimuli followed by Right > left lower extremity withdraw to noxious stimuli on reassessment.  Right upper extremity with right wrist extension movements that appear rhythmic with intermittent and inconsistent left arm contraction.  Sensation: No withdraw to noxious stimuli in bilateral upper extremities, initially no response to noxious stimuli in BLE, however, on reassessment RLE > LLE withdraw to noxious stimuli.  Coordination: UTA, patient does not follow commands DTRs: 2+ throughout.  Plantars: Toes upgoing bilaterally Gait: Deferred  Labs I have reviewed labs in epic and the results pertinent to this consultation are: CBC    Component Value Date/Time   WBC 8.1 02/13/2021 1651   RBC 3.79 (L) 02/13/2021 1651   HGB 12.9 02/13/2021 1745   HCT 38.0 02/13/2021 1745   PLT 240 02/13/2021 1651   MCV 109.5 (H) 02/13/2021 1651   MCH 35.1 (H) 02/13/2021 1651   MCHC 32.0 02/13/2021 1651   RDW 12.5 02/13/2021 1651   LYMPHSABS 1.3 02/19/2020 0600   MONOABS 0.3 02/19/2020 0600   EOSABS 0.1 02/19/2020 0600   BASOSABS 0.1  02/19/2020 0600  CMP     Component Value Date/Time   NA 132 (L) 02/13/2021 1745   K 2.4 (LL) 02/13/2021 1745   CL 100 02/13/2021 1657   CO2 13 (L) 02/13/2021 1651   GLUCOSE 241 (H) 02/13/2021 1657   BUN 4 (L) 02/13/2021 1657   CREATININE 0.70 02/13/2021 1657   CALCIUM 7.6 (L) 02/13/2021 1651   PROT 6.3 (L) 02/13/2021 1651   ALBUMIN 3.0 (L) 02/13/2021 1651   AST 307 (H) 02/13/2021 1651   ALT 58 (H) 02/13/2021 1651   ALKPHOS 92 02/13/2021 1651   BILITOT 0.8 02/13/2021 1651   GFRNONAA >60 02/13/2021 1651   GFRAA >60 02/19/2020 0600  Drugs of Abuse     Component Value Date/Time   LABOPIA NONE  DETECTED 02/17/2020 1932   COCAINSCRNUR NONE DETECTED 02/17/2020 1932   COCAINSCRNUR Negative 01/18/2019 0739   LABBENZ NONE DETECTED 02/17/2020 1932   AMPHETMU NONE DETECTED 02/17/2020 1932   THCU NONE DETECTED 02/17/2020 1932   LABBARB NONE DETECTED 02/17/2020 1932    Urinalysis    Component Value Date/Time   COLORURINE AMBER (A) 02/17/2020 1932   APPEARANCEUR CLOUDY (A) 02/17/2020 1932   LABSPEC 1.024 02/17/2020 1932   PHURINE 5.0 02/17/2020 1932   GLUCOSEU NEGATIVE 02/17/2020 1932   HGBUR SMALL (A) 02/17/2020 [place] NEGATIVE 02/17/2020 1932   KETONESUR 80 (A) 02/17/2020 1932   PROTEINUR 100 (A) 02/17/2020 1932   NITRITE NEGATIVE 02/17/2020 1932   LEUKOCYTESUR NEGATIVE 02/17/2020 1932  Alcohol Level    Component Value Date/Time   ETH <10 02/17/2020 2359   Imaging I have reviewed the images obtained:  CT head 02/13/2021: No evidence of acute intracranial abnormality.   CTA neck: No significant (greater than 50%) stenosis.   CTA head: Severely motion limited exam without evidence of a large vessel occlusion.  Assessment: 40 y.o. female who presented to the ED for witnessed-seizure like activity at home witnessed by fiance then with EMS with VF arrest s/p defibrillation and 10 minutes of CPR with PEA until achieving ROSC. She was initiated on pressors for  hypotension prior to transfer to ICU. - Examination reveals patient that is intubated and sedated with posturing movements, roving eye movements, and initially no response to noxious stimuli. After intubation there was ongoing concern for seizure-like activity versus posturing movements with rhythmic right wrist extension with her roving eye movements. On reassessment, her BLE Right > left withdraw to noxious stimuli without response to noxious stimuli in BUE.  - CTH imaging is without evidence of acute intracranial abnormality or significant stenosis.  - Differentials include new onset seizure / status epilepticus versus posturing. Patient does have a history of alcohol abuse and ETOH withdrawal with risk for ETOH withdrawal seizures. LFTs are elevated with AST > ALT. Alcohol level pending.  - Seized prior to cardiac arrest.  - Given cardiac arrest prior in the field, there is concern for possible hypoxic/anoxic brain injury    Impression: -Concern for seizure activity versus abnormal posturing -S/p VF cardiac arrest with 10 minutes to ROSC  Recommendations: - STAT LTM EEG - Loaded with 20 mg/kg Keppra in the ED. Additional 1000 mg administered at 2330 - Continue on Keppra at 1000 mg IV BID - 2 mg IV Ativan PRN for seizure activity lasting > 5 minutes and notify neurology  - Initiate inpatient seizure precautions - MRI brain wo contrast when able to obtain  - Alcohol level, UDS pending - Infectious work up with blood cultures, UA, CXR, COVID screening pending - Intubated and sedated - TTM per ICU team  Pt seen by NP/Neuro and MD.  Anibal Henderson, AGAC-NP [facility] Pager: [phone]  Addendum:  - LTM EEG at 11:20 PM shows no electrographic seizures. Tracings are low amplitude without significant asymmetry between left and right sided leads.  - On propofol at a rate of 50.  - Blood EtOH level came back elevated at 104  I have seen and examined the patient. I have  reviewed the assessment and recommendations which were discussed with the neurology NP and have made amendations as indicated. 40 y.o. female who presented to the ED for witnessed-seizure like activity at home witnessed by fiance then with EMS with VF arrest s/p defibrillation and 10 minutes of  CPR with PEA until achieving ROSC. She was initiated on pressors for hypotension prior to transfer to ICU. Exam findings suggestive of seizure activity versus abnormal posturing. Loaded with Keppra. LTM EEG preliminary read shows no electrographic seizures. Recommendations as above.  Electronically signed: Dr. Kerney Elbe

## 2021-02-13 NOTE — Progress Notes (Signed)
RT NOTES: Order from MD to withdraw ETT x  3cm. Withdrew tube from 24 cm at the lip to 21 cm at the lip.

## 2021-02-13 NOTE — Progress Notes (Signed)
.  eeg

## 2021-02-13 NOTE — Progress Notes (Signed)
RT NOTES: ABG results given to Georgann Housekeeper NP.

## 2021-02-13 NOTE — ED Notes (Signed)
The mother would like a call back as soon as possible  her name is Diane  [phone]

## 2021-02-13 NOTE — Progress Notes (Signed)
RT NOTES: Pt transported from ED to 3M13 on vent without incident.

## 2021-02-13 NOTE — ED Triage Notes (Signed)
Pt via EMS as post arrest-family called EMS for "seizure like activity," for which she was given 2.5 mg versed by EMS. Pt then arrested in front of EMS. She was shocked one time, given one epi, and 300 of amiodarone with a total CPR time of 10 mins before ROSC. Arrives unresponsive with assisted ventilations via BVM.

## 2021-02-13 NOTE — H&P (Addendum)
NAME:  Susan [name], MRN:  [id number], DOB:  07-16-1980, LOS: 0 ADMISSION DATE:  02/13/2021, CONSULTATION DATE: 9/6 REFERRING MD: Dr. 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  She has recent admissions to the [facility] health system for alcoholic ketoacidosis, pancreatitis.  She also has recent office visits for major depressive disorder.  She was at home in her usual state of health until 9/6 when she had witnessed seizure-like activity.  This was witnessed by her fianc who called EMS and started CPR.  Upon EMS arrival the patient was reportedly awake, although it is unclear what her true level of consciousness was.  She did again had seizure-like activity which progressed to cardiac arrest.  Initial rhythm was ventricular fibrillation and she was defibrillated.  She then underwent 10 additional minutes of ACLS with PEA at each additional rhythm check until ROSC.  She was transported to [facility] emergency department she was intubated upon arrival.  No purposeful movements were identified in the emergency department. PCCM asked to admit.  Pertinent  Medical History    has a past medical history of AKI (acute kidney injury) ([place]) (06/15/2019), Alcohol abuse, Alcoholic 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   Significant Hospital Events: Including procedures, antibiotic start and stop dates in addition to other pertinent events   9/6 admit for sz, cardiac arrest.   Interim History / Subjective:    Objective   Blood pressure 99/82, pulse (!) 105, resp. rate (!) 25, height 5' 2"  (1.575 m), SpO2 99 %, not currently breastfeeding.    Vent Mode: PRVC FiO2  (%):  [40 %-100 %] 40 % Set Rate:  [18 bmp] 18 bmp Vt Set:  [400 mL] 400 mL PEEP:  [5 cmH20] 5 cmH20   Intake/Output Summary (Last 24 hours) at 02/13/2021 1800 Last data filed at 02/13/2021 1732 Gross per 24 hour  Intake 10 ml  Output --  Net 10 ml   There were no vitals filed for this visit.  Examination: General: Middle aged female on vent HENT: [place]/AT, No appreciable JVD Lungs: Clear bilateral breath sounds Cardiovascular: RRR, no MRG.  Abdomen: Soft, non-distended, hypoactive.  Extremities: No acute deformity, no edema.  Neuro: Unresponsive. Rhythmic contraction of RUE with synchronous flexor posturing of bilateral lower extremities. Roving horizontal eye movements.   Resolved Hospital Problem list     Assessment & Plan:   Cardiac arrest: Initial rhythm VF.  etiology not certain, electrolyte abnormalities are certainly a concern. Her drinking history is not indicative of withdrawal.  CTA head and CTA chest without obvious cause.  - Admit to ICU - TTM protocol - EEG  - Echocardiogram  Acute hypoxemic respiratory failure Aspiration PNA - Full vent support - ABG reviewed, settings adjusted - Follow CXR - VAP bundle - CTX/Flagyl  DM: following pregnancy 13 months prior. Family does not feels she has not been treating this well.  - Hgb A1c - CBG monitoring and SSI.   Acute encephalopathy: s/p arrest - Neuro workup as above - Propofol/Fentanyl for RASS goal -1 to -2.   Hypokalemia Hypocalcemia - Give K, Ca - Repeat Labs tonight.  - Give Mag to reach goal of 2.   Seizure: likely alcohol withdrawal seizure - Neurology consult, defer AED  to them - PRN ativan for sz  Alcohol abuse history, but recently has been drinking a glass of wine -4 days per week per husband.  - Thiamine - Folate - MVI  Best Practice (right click and "Reselect all SmartList Selections" daily)   Diet/type: NPO DVT prophylaxis: LMWH GI prophylaxis: PPI Lines: N/A Foley:  Yes, and it is  still needed Code Status:  full code Last date of multidisciplinary goals of care discussion [9/6]  Labs   CBC: Recent Labs  Lab 02/13/21 1651 02/13/21 1657 02/13/21 1745  WBC 8.1  --   --   HGB 13.3 14.6 12.9  HCT 41.5 43.0 38.0  MCV 109.5*  --   --   PLT 240  --   --     Basic Metabolic Panel: Recent Labs  Lab 02/13/21 1651 02/13/21 1657 02/13/21 1745  NA 132* 134* 132*  K 3.0* 3.0* 2.4*  CL 99 100  --   CO2 13*  --   --   GLUCOSE 242* 241*  --   BUN 6 4*  --   CREATININE 0.76 0.70  --   CALCIUM 7.6*  --   --   MG 1.7  --   --    GFR: CrCl cannot be calculated (Unknown ideal weight.). Recent Labs  Lab 02/13/21 1651  WBC 8.1    Liver Function Tests: Recent Labs  Lab 02/13/21 1651  AST 307*  ALT 58*  ALKPHOS 92  BILITOT 0.8  PROT 6.3*  ALBUMIN 3.0*   No results for input(s): LIPASE, AMYLASE in the last 168 hours. No results for input(s): AMMONIA in the last 168 hours.  ABG    Component Value Date/Time   PHART 7.213 (L) 02/13/2021 1745   PCO2ART 32.4 02/13/2021 1745   PO2ART 447 (H) 02/13/2021 1745   HCO3 13.1 (L) 02/13/2021 1745   TCO2 14 (L) 02/13/2021 1745   ACIDBASEDEF 14.0 (H) 02/13/2021 1745   O2SAT 100.0 02/13/2021 1745     Coagulation Profile: No results for input(s): INR, PROTIME in the last 168 hours.  Cardiac Enzymes: No results for input(s): CKTOTAL, CKMB, CKMBINDEX, TROPONINI in the last 168 hours.  HbA1C: Hgb A1c MFr Bld  Date/Time Value Ref Range Status  06/16/2019 10:05 AM 5.3 4.8 - 5.6 % Final    Comment:    (NOTE) Pre diabetes:          5.7%-6.4% Diabetes:              >6.4% Glycemic control for   <7.0% adults with diabetes   06/04/2016 06:38 PM 5.1 4.8 - 5.6 % Final    Comment:    (NOTE)         Pre-diabetes: 5.7 - 6.4         Diabetes: >6.4         Glycemic control for adults with diabetes: <7.0     CBG: No results for input(s): GLUCAP in the last 168 hours.  Review of Systems:   Patient is  encephalopathic and/or intubated. Therefore history has been obtained from chart review.   Past Medical History:  She,  has a past medical history of AKI (acute kidney injury) ([place]) (06/15/2019), Alcohol abuse, Alcoholic 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   Surgical History:   Past Surgical History:  Procedure Laterality Date   CERVICAL CERCLAGE Bilateral 01/18/2019   Procedure:  CERCLAGE CERVICAL;  Surgeon: Everett Graff, MD;  Location: [facility] LD ORS;  Service: Gynecology;  Laterality: Bilateral;   DILATATION AND CURETTAGE/HYSTEROSCOPY WITH MINERVA N/A 08/23/2019   Procedure: DILATATION AND CURETTAGE/HYSTEROSCOPY WITH MINERVA;  Surgeon: Waymon Amato, MD;  Location: [facility];  Service: Gynecology;  Laterality: N/A;  rep will be here for this case confirmed on 08/18/19.   ESOPHAGOGASTRODUODENOSCOPY (EGD) WITH PROPOFOL N/A 06/05/2016   Procedure: ESOPHAGOGASTRODUODENOSCOPY (EGD) WITH PROPOFOL;  Surgeon: Otis Brace, MD;  Location: [facility] ENDOSCOPY;  Service: Gastroenterology;  Laterality: N/A;   PLACEMENT OF BREAST IMPLANTS       Social History:   reports that she quit smoking about 7 years ago. She has a 7.50 pack-year smoking history. She has never used smokeless tobacco. She reports current alcohol use. She reports that she does not use drugs.   Family History:  Her family history includes Diabetes in her maternal grandmother; Stroke in her mother.   Allergies Allergies  Allergen Reactions   Chlorhexidine Other (See Comments)   Penicillins Nausea And Vomiting   Effexor [Venlafaxine] Palpitations and Other (See Comments)    Panic attacks   Latex Rash    Tape irritates the skin/ please use paper tape   Tape Rash    pls use paper tape     Home Medications  Prior to Admission  medications   Medication Sig Start Date End Date Taking? Authorizing Provider  Multiple Vitamin (MULTIVITAMIN WITH MINERALS) TABS tablet Take 1 tablet by mouth daily. 02/19/20   Aline August, MD  ondansetron (ZOFRAN) 4 MG tablet Take 1 tablet (4 mg total) by mouth every 6 (six) hours as needed for nausea or vomiting. 02/19/20   Aline August, MD  pantoprazole (PROTONIX) 40 MG tablet Take 1 tablet (40 mg total) by mouth daily. 02/19/20 03/20/20  Aline August, MD  thiamine 100 MG tablet Take 1 tablet (100 mg total) by mouth daily. 02/19/20   Aline August, MD  traMADol (ULTRAM) 50 MG tablet Take 1 tablet (50 mg total) by mouth every 6 (six) hours as needed for severe pain. 02/19/20   Aline August, MD     Critical care time: 46 minutes     Georgann Housekeeper, AGACNP-BC [facility]  See Amion for personal pager PCCM on call pager [phone] until 7pm. Please call Elink 7p-7a. [phone]  02/13/2021 6:32 PM

## 2021-02-13 NOTE — Progress Notes (Signed)
LTM started  [facility] to monitor   Event button tested

## 2021-02-13 NOTE — Progress Notes (Signed)
[facility] Progress Note Patient Name: Susan [name] DOB: August 15, 1980 MRN: [id number]   Date of Service  02/13/2021  HPI/Events of Note  Patient with sub-optimal sedation on Propofol + Fentanyl.  eICU Interventions  Versed 2 mg iv Q 2 hours PRN agitation.        [name] Jawann Urbani 02/13/2021, 10:53 PM

## 2021-02-14 ENCOUNTER — Inpatient Hospital Stay (HOSPITAL_COMMUNITY): Payer: Medicaid Other

## 2021-02-14 ENCOUNTER — Encounter (HOSPITAL_COMMUNITY): Payer: Self-pay | Admitting: Pulmonary Disease

## 2021-02-14 DIAGNOSIS — J9601 Acute respiratory failure with hypoxia: Secondary | ICD-10-CM | POA: Diagnosis not present

## 2021-02-14 DIAGNOSIS — I469 Cardiac arrest, cause unspecified: Secondary | ICD-10-CM | POA: Diagnosis not present

## 2021-02-14 DIAGNOSIS — G9341 Metabolic encephalopathy: Secondary | ICD-10-CM | POA: Diagnosis not present

## 2021-02-14 LAB — CBC WITH DIFFERENTIAL/PLATELET
Abs Immature Granulocytes: 0 10*3/uL (ref 0.00–0.07)
Basophils Absolute: 0 10*3/uL (ref 0.0–0.1)
Basophils Relative: 0 %
Eosinophils Absolute: 0 10*3/uL (ref 0.0–0.5)
Eosinophils Relative: 0 %
HCT: 32 % — ABNORMAL LOW (ref 36.0–46.0)
Hemoglobin: 10.3 g/dL — ABNORMAL LOW (ref 12.0–15.0)
Lymphocytes Relative: 7 %
Lymphs Abs: 0.6 10*3/uL — ABNORMAL LOW (ref 0.7–4.0)
MCH: 34.7 pg — ABNORMAL HIGH (ref 26.0–34.0)
MCHC: 32.2 g/dL (ref 30.0–36.0)
MCV: 107.7 fL — ABNORMAL HIGH (ref 80.0–100.0)
Monocytes Absolute: 0.3 10*3/uL (ref 0.1–1.0)
Monocytes Relative: 3 %
Neutro Abs: 7.9 10*3/uL — ABNORMAL HIGH (ref 1.7–7.7)
Neutrophils Relative %: 90 %
Platelets: 222 10*3/uL (ref 150–400)
RBC: 2.97 MIL/uL — ABNORMAL LOW (ref 3.87–5.11)
RDW: 12.8 % (ref 11.5–15.5)
WBC: 8.8 10*3/uL (ref 4.0–10.5)
nRBC: 0.3 % — ABNORMAL HIGH (ref 0.0–0.2)
nRBC: 1 /100 WBC — ABNORMAL HIGH

## 2021-02-14 LAB — POCT I-STAT 7, (LYTES, BLD GAS, ICA,H+H)
Acid-Base Excess: 0 mmol/L (ref 0.0–2.0)
Bicarbonate: 23.5 mmol/L (ref 20.0–28.0)
Calcium, Ion: 1.09 mmol/L — ABNORMAL LOW (ref 1.15–1.40)
HCT: 35 % — ABNORMAL LOW (ref 36.0–46.0)
Hemoglobin: 11.9 g/dL — ABNORMAL LOW (ref 12.0–15.0)
O2 Saturation: 100 %
Patient temperature: 36.6
Potassium: 3.8 mmol/L (ref 3.5–5.1)
Sodium: 136 mmol/L (ref 135–145)
TCO2: 25 mmol/L (ref 22–32)
pCO2 arterial: 35 mmHg (ref 32.0–48.0)
pH, Arterial: 7.434 (ref 7.350–7.450)
pO2, Arterial: 177 mmHg — ABNORMAL HIGH (ref 83.0–108.0)

## 2021-02-14 LAB — COMPREHENSIVE METABOLIC PANEL
ALT: 46 U/L — ABNORMAL HIGH (ref 0–44)
ALT: 52 U/L — ABNORMAL HIGH (ref 0–44)
AST: 153 U/L — ABNORMAL HIGH (ref 15–41)
AST: 170 U/L — ABNORMAL HIGH (ref 15–41)
Albumin: 2.6 g/dL — ABNORMAL LOW (ref 3.5–5.0)
Albumin: 2.9 g/dL — ABNORMAL LOW (ref 3.5–5.0)
Alkaline Phosphatase: 73 U/L (ref 38–126)
Alkaline Phosphatase: 82 U/L (ref 38–126)
Anion gap: 14 (ref 5–15)
Anion gap: 10 (ref 5–15)
BUN: 6 mg/dL (ref 6–20)
BUN: 6 mg/dL (ref 6–20)
CO2: 19 mmol/L — ABNORMAL LOW (ref 22–32)
CO2: 20 mmol/L — ABNORMAL LOW (ref 22–32)
Calcium: 7.3 mg/dL — ABNORMAL LOW (ref 8.9–10.3)
Calcium: 7.9 mg/dL — ABNORMAL LOW (ref 8.9–10.3)
Chloride: 93 mmol/L — ABNORMAL LOW (ref 98–111)
Chloride: 105 mmol/L (ref 98–111)
Creatinine, Ser: 0.63 mg/dL (ref 0.44–1.00)
Creatinine, Ser: 0.71 mg/dL (ref 0.44–1.00)
GFR, Estimated: >60 mL/min (ref >60)
GFR, Estimated: >60 mL/min (ref >60)
Glucose, Bld: 580 mg/dL (ref 70–99)
Glucose, Bld: 138 mg/dL — ABNORMAL HIGH (ref 70–99)
Potassium: 3.1 mmol/L — ABNORMAL LOW (ref 3.5–5.1)
Potassium: 3.6 mmol/L (ref 3.5–5.1)
Sodium: 126 mmol/L — ABNORMAL LOW (ref 135–145)
Sodium: 135 mmol/L (ref 135–145)
Total Bilirubin: 1.3 mg/dL — ABNORMAL HIGH (ref 0.3–1.2)
Total Bilirubin: 1 mg/dL (ref 0.3–1.2)
Total Protein: 5.8 g/dL — ABNORMAL LOW (ref 6.5–8.1)
Total Protein: 6.3 g/dL — ABNORMAL LOW (ref 6.5–8.1)

## 2021-02-14 LAB — BASIC METABOLIC PANEL
Anion gap: 10 (ref 5–15)
Anion gap: 9 (ref 5–15)
BUN: 6 mg/dL (ref 6–20)
BUN: 7 mg/dL (ref 6–20)
CO2: 23 mmol/L (ref 22–32)
CO2: 23 mmol/L (ref 22–32)
Calcium: 8.2 mg/dL — ABNORMAL LOW (ref 8.9–10.3)
Calcium: 7.9 mg/dL — ABNORMAL LOW (ref 8.9–10.3)
Chloride: 102 mmol/L (ref 98–111)
Chloride: 103 mmol/L (ref 98–111)
Creatinine, Ser: 0.63 mg/dL (ref 0.44–1.00)
Creatinine, Ser: 0.61 mg/dL (ref 0.44–1.00)
GFR, Estimated: >60 mL/min (ref >60)
GFR, Estimated: >60 mL/min (ref >60)
Glucose, Bld: 91 mg/dL (ref 70–99)
Glucose, Bld: 130 mg/dL — ABNORMAL HIGH (ref 70–99)
Potassium: 3.6 mmol/L (ref 3.5–5.1)
Potassium: 3.8 mmol/L (ref 3.5–5.1)
Sodium: 135 mmol/L (ref 135–145)
Sodium: 135 mmol/L (ref 135–145)

## 2021-02-14 LAB — ECHOCARDIOGRAM COMPLETE
Area-P 1/2: 4.86 cm2
Height: 62 in
S' Lateral: 4 cm
Weight: 2070.56 oz

## 2021-02-14 LAB — PHOSPHORUS
Phosphorus: 3 mg/dL (ref 2.5–4.6)
Phosphorus: 3.4 mg/dL (ref 2.5–4.6)
Phosphorus: 2.9 mg/dL (ref 2.5–4.6)
Phosphorus: 2.4 mg/dL — ABNORMAL LOW (ref 2.5–4.6)

## 2021-02-14 LAB — GLUCOSE, CAPILLARY
Glucose-Capillary: 104 mg/dL — ABNORMAL HIGH (ref 70–99)
Glucose-Capillary: 115 mg/dL — ABNORMAL HIGH (ref 70–99)
Glucose-Capillary: 128 mg/dL — ABNORMAL HIGH (ref 70–99)
Glucose-Capillary: 139 mg/dL — ABNORMAL HIGH (ref 70–99)
Glucose-Capillary: 201 mg/dL — ABNORMAL HIGH (ref 70–99)
Glucose-Capillary: 204 mg/dL — ABNORMAL HIGH (ref 70–99)
Glucose-Capillary: 93 mg/dL (ref 70–99)

## 2021-02-14 LAB — LACTIC ACID, PLASMA
Lactic Acid, Venous: 3.8 mmol/L (ref 0.5–1.9)
Lactic Acid, Venous: 2.2 mmol/L (ref 0.5–1.9)
Lactic Acid, Venous: 1.7 mmol/L (ref 0.5–1.9)

## 2021-02-14 LAB — TRIGLYCERIDES: Triglycerides: 222 mg/dL — ABNORMAL HIGH (ref <150)

## 2021-02-14 LAB — TROPONIN I (HIGH SENSITIVITY): Troponin I (High Sensitivity): 50 ng/L — ABNORMAL HIGH (ref <18)

## 2021-02-14 LAB — COOXEMETRY PANEL
Carboxyhemoglobin: 1 % (ref 0.5–1.5)
Methemoglobin: 0.7 % (ref 0.0–1.5)
O2 Saturation: 58.4 %
Total hemoglobin: 11.6 g/dL — ABNORMAL LOW (ref 12.0–16.0)

## 2021-02-14 LAB — CBC
HCT: 35.2 % — ABNORMAL LOW (ref 36.0–46.0)
Hemoglobin: 12.2 g/dL (ref 12.0–15.0)
MCH: 35.8 pg — ABNORMAL HIGH (ref 26.0–34.0)
MCHC: 34.7 g/dL (ref 30.0–36.0)
MCV: 103.2 fL — ABNORMAL HIGH (ref 80.0–100.0)
Platelets: 230 10*3/uL (ref 150–400)
RBC: 3.41 MIL/uL — ABNORMAL LOW (ref 3.87–5.11)
RDW: 12.7 % (ref 11.5–15.5)
WBC: 8.7 10*3/uL (ref 4.0–10.5)
nRBC: 0.5 % — ABNORMAL HIGH (ref 0.0–0.2)

## 2021-02-14 LAB — MAGNESIUM
Magnesium: 2.1 mg/dL (ref 1.7–2.4)
Magnesium: 1.9 mg/dL (ref 1.7–2.4)
Magnesium: 2.4 mg/dL (ref 1.7–2.4)
Magnesium: 3.1 mg/dL — ABNORMAL HIGH (ref 1.7–2.4)

## 2021-02-14 LAB — SEDIMENTATION RATE: Sed Rate: 8 mm/hr (ref 0–22)

## 2021-02-14 MED ORDER — METRONIDAZOLE 500 MG PO TABS: 500.0000 mg | ORAL_TABLET | Freq: Two times a day (BID) | ORAL | Status: DC

## 2021-02-14 MED ORDER — LACTULOSE 10 GM/15ML PO SOLN
10.0000 g | Freq: Three times a day (TID) | ORAL | Status: DC
  Administered 2021-02-14: 10 g
  Filled 2021-02-14: qty 15

## 2021-02-14 MED ORDER — FOLIC ACID 1 MG PO TABS
1.0000 mg | ORAL_TABLET | Freq: Every day | ORAL | Status: DC
  Administered 2021-02-14: 1 mg
  Filled 2021-02-14: qty 1

## 2021-02-14 MED ORDER — AMIODARONE HCL IN DEXTROSE 360-4.14 MG/200ML-% IV SOLN
30.0000 mg/h | INTRAVENOUS | Status: DC
  Administered 2021-02-14 – 2021-02-16 (×4): 30 mg/h via INTRAVENOUS
  Filled 2021-02-14 (×3): qty 200

## 2021-02-14 MED ORDER — SODIUM CHLORIDE 0.9% FLUSH
10.0000 mL | Freq: Two times a day (BID) | INTRAVENOUS | Status: DC
  Administered 2021-02-14 – 2021-02-19 (×10): 10 mL

## 2021-02-14 MED ORDER — POTASSIUM PHOSPHATES 15 MMOLE/5ML IV SOLN
30.0000 mmol | Freq: Once | INTRAVENOUS | Status: AC
  Administered 2021-02-14: 30 mmol via INTRAVENOUS
  Filled 2021-02-14: qty 10

## 2021-02-14 MED ORDER — ADULT MULTIVITAMIN W/MINERALS CH
1.0000 | ORAL_TABLET | Freq: Every day | ORAL | Status: DC
  Administered 2021-02-14: 1
  Filled 2021-02-14: qty 1

## 2021-02-14 MED ORDER — LORAZEPAM 2 MG/ML IJ SOLN: 1.0000 mg | Freq: Once | INTRAMUSCULAR | Status: DC

## 2021-02-14 MED ORDER — MAGNESIUM SULFATE 2 GM/50ML IV SOLN
2.0000 g | Freq: Once | INTRAVENOUS | Status: AC
  Administered 2021-02-14: 2 g via INTRAVENOUS
  Filled 2021-02-14: qty 50

## 2021-02-14 MED ORDER — LORAZEPAM 2 MG/ML IJ SOLN
0.5000 mg | INTRAMUSCULAR | Status: DC | PRN
  Administered 2021-02-14: 1 mg via INTRAVENOUS
  Administered 2021-02-16: 0.5 mg via INTRAVENOUS
  Filled 2021-02-14 (×2): qty 1

## 2021-02-14 MED ORDER — DEXAMETHASONE SODIUM PHOSPHATE 4 MG/ML IJ SOLN
4.0000 mg | Freq: Once | INTRAMUSCULAR | Status: AC
  Administered 2021-02-14: 4 mg via INTRAVENOUS
  Filled 2021-02-14: qty 1

## 2021-02-14 MED ORDER — LORAZEPAM 2 MG/ML IJ SOLN
INTRAMUSCULAR | Status: AC
  Filled 2021-02-14: qty 1

## 2021-02-14 MED ORDER — AMIODARONE IV BOLUS ONLY 150 MG/100ML
150.0000 mg | Freq: Once | INTRAVENOUS | Status: AC
  Administered 2021-02-14: 150 mg via INTRAVENOUS

## 2021-02-14 MED ORDER — POTASSIUM CHLORIDE 10 MEQ/50ML IV SOLN
10.0000 meq | INTRAVENOUS | Status: AC
  Administered 2021-02-14 (×2): 10 meq via INTRAVENOUS
  Filled 2021-02-14: qty 50

## 2021-02-14 MED ORDER — SODIUM CHLORIDE 0.9% FLUSH: 10.0000 mL | INTRAVENOUS | Status: DC | PRN

## 2021-02-14 MED ORDER — AMIODARONE HCL IN DEXTROSE 360-4.14 MG/200ML-% IV SOLN
60.0000 mg/h | INTRAVENOUS | Status: AC
  Administered 2021-02-14: 60 mg/h via INTRAVENOUS
  Filled 2021-02-14: qty 200

## 2021-02-14 MED ORDER — HEPARIN (PORCINE) 25000 UT/250ML-% IV SOLN
1150.0000 [IU]/h | INTRAVENOUS | Status: DC
  Administered 2021-02-14: 800 [IU]/h via INTRAVENOUS
  Filled 2021-02-14: qty 250

## 2021-02-14 MED ORDER — PERFLUTREN LIPID MICROSPHERE
1.0000 mL | INTRAVENOUS | Status: AC | PRN
  Administered 2021-02-14: 2 mL via INTRAVENOUS
  Filled 2021-02-14: qty 10

## 2021-02-14 MED ORDER — NOREPINEPHRINE 4 MG/250ML-% IV SOLN
0.0000 ug/min | INTRAVENOUS | Status: DC
  Administered 2021-02-14 (×2): 10 ug/min via INTRAVENOUS
  Administered 2021-02-14: 5 ug/min via INTRAVENOUS
  Filled 2021-02-14 (×3): qty 250

## 2021-02-14 MED ORDER — POTASSIUM CHLORIDE 20 MEQ PO PACK
20.0000 meq | PACK | Freq: Once | ORAL | Status: AC
  Administered 2021-02-14: 20 meq via ORAL
  Filled 2021-02-14: qty 1

## 2021-02-14 MED ORDER — HEPARIN BOLUS VIA INFUSION
3000.0000 [IU] | Freq: Once | INTRAVENOUS | Status: AC
  Administered 2021-02-14: 3000 [IU] via INTRAVENOUS
  Filled 2021-02-14: qty 3000

## 2021-02-14 NOTE — Progress Notes (Signed)
  Echocardiogram 2D Echocardiogram has been performed.  Susan [name] 02/14/2021, 12:45 PM

## 2021-02-14 NOTE — Consult Note (Addendum)
Advanced Heart Failure Team Consult Note   Primary Physician: Cathlean Sauer, MD PCP-Cardiologist:  None  Reason for Consultation: Acute Systolic Heart Failure, post VF Arrest x 2   HPI:    Susan [name] is seen today for evaluation of Acute Systolic Heart Failure, post VFib arrest x 2, at the request of Dr. Lake Bells, PCCM.   40 y/o female w/ no prior cardiac history. PMH includes ETOH use, alcoholic pancreatitis, and depression, on Prozac. Former smoker. H/o pregnancy. Gave birth 20 months ago. Pregnancy c/b gestational DM and preeclampsia. Recently diagnosed w/ COVID 19 6 days ago. While at home yesterday, suffered witness cardiac arrest w/ immediate bystander CPR. Found to be in VFib on EMS arrival. Treated w/ epi + defibrillation. ROSC achieved after ~10 min w/ ? Seizure like activity pre arrest. Transferred to ED and intubated. Hypotensive on arrival requiring levophed. Intoxicated w/ Alcohol, Ethyl level elevated at 104.  Initial K 3.0, Mg 1.7.  EKG sinus tach 132 w/ prolonged QT/QTC, 393/583 ms. Troponins not checked. Head CT negative. CTA of chest negative for PE. CXR w/ patchy airspace disease in the right perihilar lung, favoring atelectasis versus pneumonia. WBC 8.1, Hgb 13, Scr 0.76, CO2 13, Lactic acid 3.8. Admitted by PCCM and placed on TTM. Electrolytes supp. Neuro also consulted. EEG suggestive of diffuse encephalopathy, nonspecific etiology. No seizures or epileptiform discharges.  She was weaned off sedation earlier today and able to follow commands and was successfully extubated. Post extubation, she had nausea and vomiting and was given IV Zofran. ~1hr later, had recurrent polymorphic VT/Torsades. Received CPR + Defib w/ ROSC in ~2 min. Post arrest  EKG showed prolonged QT/QTc, 492/549 ms. No ST abnormalities. K 3.1. Mg 2.4. Given K supp and placed on IV amio. Echo completed. Per preliminary review, she has severely reduced LVEF ~30% (full report pending). No prior study for  comparison. AHF team consulted.   She is currently alert and conversant w/ some short term memory loss. Tele shows NSR, currently HR 70s. F/u BMP post K supp, 3.6.   Examined at bedside. Her fiance is present. She has short term memory loss post arrest. Fiance assisting w/ most of history. She has family h/o CHF. Maternal uncle had heart transplant in mid-late 40s. No FH of SCD. She repeated reports that her mother had a MI in her 25s but her fiance confirmed w/ mother by phone and she reports that she did not. She had CVA.   Echo: Preliminary report, severely reduced LVEF ~30%. Full report pending.     Review of Systems: [y] = yes, [ ]  = no   General: Weight gain [ ] ; Weight loss [ ] ; Anorexia [ ] ; Fatigue [ ] ; Fever [ ] ; Chills [ ] ; Weakness [ ]   Cardiac: Chest pain/pressure [ ] ; Resting SOB [ ] ; Exertional SOB [ ] ; Orthopnea [ ] ; Pedal Edema [ ] ; Palpitations [ ] ; Syncope [ Y]; Presyncope [ ] ; Paroxysmal nocturnal dyspnea[ ]   Pulmonary: Cough [ ] ; Wheezing[ ] ; Hemoptysis[ ] ; Sputum [ ] ; Snoring [ ]   GI: Vomiting[ ] ; Dysphagia[ ] ; Melena[ ] ; Hematochezia [ ] ; Heartburn[ ] ; Abdominal pain [ ] ; Constipation [ ] ; Diarrhea [ ] ; BRBPR [ ]   GU: Hematuria[ ] ; Dysuria [ ] ; Nocturia[ ]   Vascular: Pain in legs with walking [ ] ; Pain in feet with lying flat [ ] ; Non-healing sores [ ] ; Stroke [ ] ; TIA [ ] ; Slurred speech [ ] ;  Neuro: Headaches[ ] ; Vertigo[ ] ; Seizures[Y ]; Paresthesias[ ] ;  Blurred vision [ ] ; Diplopia [ ] ; Vision changes [ ]   Ortho/Skin: Arthritis [ ] ; Joint pain [ ] ; Muscle pain [ ] ; Joint swelling [ ] ; Back Pain [ ] ; Rash [ ]   Psych: Depression[Y ]; Anxiety[ ]   Heme: Bleeding problems [ ] ; Clotting disorders [ ] ; Anemia [ ]   Endocrine: Diabetes [Y ]; Thyroid dysfunction[ ]   Home Medications Prior to Admission medications   Medication Sig Start Date End Date Taking? Authorizing Provider  cetirizine (ZYRTEC) 10 MG tablet Take 10 mg by mouth daily as needed for allergies.   Yes  [provider]  FLUoxetine (PROZAC) 20 MG capsule Take 20 mg by mouth daily. 01/25/21  Yes [provider]  Multiple Vitamin (MULTIVITAMIN WITH MINERALS) TABS tablet Take 1 tablet by mouth daily. Patient not taking: Reported on 02/13/2021 02/19/20   Aline August, MD  ondansetron (ZOFRAN) 4 MG tablet Take 1 tablet (4 mg total) by mouth every 6 (six) hours as needed for nausea or vomiting. Patient not taking: Reported on 02/13/2021 02/19/20   Aline August, MD  pantoprazole (PROTONIX) 40 MG tablet Take 1 tablet (40 mg total) by mouth daily. 02/19/20 03/20/20  Aline August, MD  thiamine 100 MG tablet Take 1 tablet (100 mg total) by mouth daily. Patient not taking: Reported on 02/13/2021 02/19/20   Aline August, MD  traMADol (ULTRAM) 50 MG tablet Take 1 tablet (50 mg total) by mouth every 6 (six) hours as needed for severe pain. Patient not taking: Reported on 02/13/2021 02/19/20   Aline August, MD    Past Medical History: Past Medical History:  Diagnosis Date   AKI (acute kidney injury) ([place]) 06/15/2019   admitted with delhydration r/o arf   Alcohol abuse    Alcoholic hepatitis without ascites    Depression    Family history of adverse reaction to anesthesia    MOTHER HAD PANIC ATTACKS & NAUSEA  AFTER   Gastritis    GIB (gastrointestinal bleeding)    Hypertension    Iron deficiency anemia    Pancreatitis    PONV (postoperative nausea and vomiting)     Past Surgical History: Past Surgical History:  Procedure Laterality Date   CERVICAL CERCLAGE Bilateral 01/18/2019   Procedure: CERCLAGE CERVICAL;  Surgeon: Everett Graff, MD;  Location: [facility];  Service: Gynecology;  Laterality: Bilateral;   DILATATION AND CURETTAGE/HYSTEROSCOPY WITH MINERVA N/A 08/23/2019   Procedure: DILATATION AND CURETTAGE/HYSTEROSCOPY WITH MINERVA;  Surgeon: Waymon Amato, MD;  Location: [facility];  Service: Gynecology;  Laterality: N/A;  rep will be here for this case confirmed on  08/18/19.   ESOPHAGOGASTRODUODENOSCOPY (EGD) WITH PROPOFOL N/A 06/05/2016   Procedure: ESOPHAGOGASTRODUODENOSCOPY (EGD) WITH PROPOFOL;  Surgeon: Otis Brace, MD;  Location: [facility];  Service: Gastroenterology;  Laterality: N/A;   PLACEMENT OF BREAST IMPLANTS      Family History: Family History  Problem Relation Age of Onset   Stroke Mother    Diabetes Maternal Grandmother     Social History: Social History   Socioeconomic History   Marital status: Single    Spouse name: Not on file   Number of children: Not on file   Years of education: Not on file   Highest education level: Not on file  Occupational History   Occupation: Careers adviser, currently unemployed  Tobacco Use   Smoking status: Former    Packs/day: 0.50    Years: 15.00    Pack years: 7.50    Types: Cigarettes    Quit date: 2015  Years since quitting: 7.6   Smokeless tobacco: Never  Vaping Use   Vaping Use: Never used  Substance and Sexual Activity   Alcohol use: Yes   Drug use: No   Sexual activity: Yes    Birth control/protection: Injection    Comment: depo injection  Other Topics Concern   Not on file  Social History Narrative   Not on file   Social Determinants of Health   Financial Resource Strain: Not on file  Food Insecurity: Not on file  Transportation Needs: Not on file  Physical Activity: Not on file  Stress: Not on file  Social Connections: Not on file    Allergies:  Allergies  Allergen Reactions   Ibuprofen Anaphylaxis   Chlorhexidine Other (See Comments)   Penicillins Nausea And Vomiting   Effexor [Venlafaxine] Palpitations and Other (See Comments)    Panic attacks   Latex Rash    Tape irritates the skin/ please use paper tape   Tape Rash    pls use paper tape    Objective:    Vital Signs:   Temp:  [97.7 F (36.5 C)-99.7 F (37.6 C)] 98 F (36.7 C) (09/07 1539) Pulse Rate:  [62-137] 73 (09/07 1703) Resp:  [14-27] 20 (09/07 1703) BP: (74-142)/(54-120) 115/82  (09/07 1700) SpO2:  [90 %-100 %] 100 % (09/07 1703) FiO2 (%):  [40 %] 40 % (09/07 0800) Weight:  [55.3 kg-58.7 kg] 58.7 kg (09/07 0500) Last BM Date: 02/14/21  Weight change: Filed Weights   02/13/21 1900 02/14/21 0500  Weight: 55.3 kg 58.7 kg    Intake/Output:   Intake/Output Summary (Last 24 hours) at 02/14/2021 1720 Last data filed at 02/14/2021 1130 Gross per 24 hour  Intake 1319.13 ml  Output 1425 ml  Net -105.87 ml      Physical Exam    General:  flushed appearing, No resp difficulty HEENT: normal Neck: supple. JVP elevated to jaw . Carotids 2+ bilat; no bruits. No lymphadenopathy or thyromegaly appreciated. Cor: PMI nondisplaced. Regular rate & rhythm. No rubs, gallops or murmurs. Lungs: decreased BS at the bases bilaterally  Abdomen: soft, nontender, nondistended. No hepatosplenomegaly. No bruits or masses. Good bowel sounds. Extremities: no cyanosis, clubbing, rash, edema, cool distal extremities  Neuro: alert & conversant, moves all 4 extremities w/o difficulty. Affect pleasant, + short term memory loss    Telemetry   Polymorphic VT/Torsades ~12PM today   Currently NSR 70s   EKG    Admit EKG, post VF arrest>>Sinus tach 132 bpm, prolonged QT/QTC, 393/583 ms. No STE   EKG today, post recurrent VF arrest>>NSR 75 bpm, prolonged QT/QTc 492/549. No STE   Labs   Basic Metabolic Panel: Recent Labs  Lab 02/13/21 1651 02/13/21 1657 02/13/21 1745 02/13/21 2300 02/14/21 0021 02/14/21 0325 02/14/21 1203 02/14/21 1340  NA 132* 134*   < > 135 136 135 126* 135  K 3.0* 3.0*   < > 3.6 3.8 3.8 3.1* 3.6  CL 99 100  --  102  --  103 93* 105  CO2 13*  --   --  23  --  23 19* 20*  GLUCOSE 242* 241*  --  91  --  130* 580* 138*  BUN 6 4*  --  6  --  7 6 6   CREATININE 0.76 0.70  --  0.63  --  0.61 0.63 0.71  CALCIUM 7.6*  --   --  8.2*  --  7.9* 7.3* 7.9*  MG 1.7  --   --  2.1  --  1.9 2.4 3.1*  PHOS  --   --   --  3.0  --  3.4 2.9 2.4*   < > = values in this  interval not displayed.    Liver Function Tests: Recent Labs  Lab 02/13/21 1651 02/14/21 1203 02/14/21 1340  AST 307* 153* 170*  ALT 58* 46* 52*  ALKPHOS 92 73 82  BILITOT 0.8 1.3* 1.0  PROT 6.3* 5.8* 6.3*  ALBUMIN 3.0* 2.6* 2.9*   Recent Labs  Lab 02/13/21 2031  LIPASE 20   Recent Labs  Lab 02/13/21 1758  AMMONIA 59*    CBC: Recent Labs  Lab 02/13/21 1651 02/13/21 1657 02/13/21 1745 02/14/21 0021 02/14/21 0325 02/14/21 1203  WBC 8.1  --   --   --  8.7 8.8  NEUTROABS  --   --   --   --   --  7.9*  HGB 13.3 14.6 12.9 11.9* 12.2 10.3*  HCT 41.5 43.0 38.0 35.0* 35.2* 32.0*  MCV 109.5*  --   --   --  103.2* 107.7*  PLT 240  --   --   --  230 222    Cardiac Enzymes: No results for input(s): CKTOTAL, CKMB, CKMBINDEX, TROPONINI in the last 168 hours.  BNP: BNP (last 3 results) No results for input(s): BNP in the last 8760 hours.  ProBNP (last 3 results) No results for input(s): PROBNP in the last 8760 hours.   CBG: Recent Labs  Lab 02/14/21 0008 02/14/21 0307 02/14/21 0728 02/14/21 1218 02/14/21 1507  GLUCAP 93 128* 139* 204* 104*    Coagulation Studies: No results for input(s): LABPROT, INR in the last 72 hours.   Imaging   CT Angio Head W or Wo Contrast  Result Date: 02/13/2021 CLINICAL DATA:  Neuro deficit, acute, stroke suspected EXAM: CT HEAD WITHOUT CONTRAST CT ANGIOGRAPHY OF THE HEAD AND NECK TECHNIQUE: Contiguous axial images were obtained from the base of the skull through the vertex without intravenous contrast. Multidetector CT imaging of the head and neck was performed using the standard protocol during bolus administration of intravenous contrast. Multiplanar CT image reconstructions and MIPs were obtained to evaluate the vascular anatomy. Carotid stenosis measurements (when applicable) are obtained utilizing NASCET criteria, using the distal internal carotid diameter as the denominator. CONTRAST:  155m OMNIPAQUE IOHEXOL 350 MG/ML SOLN  COMPARISON:  None. FINDINGS: CT HEAD Brain: No evidence of acute infarction, hemorrhage, hydrocephalus, extra-axial collection or mass lesion/mass effect. Vascular: See below. Skull: No acute fracture. Sinuses/Orbits: Visualized sinuses are clear. No acute findings in the visualized orbits. CTA NECK Aortic arch: Great vessel origins are patent. Right carotid system: Patent. No significant (greater than 50%) stenosis. Left carotid system: Patent. No significant (greater than 50%) stenosis. Vertebral arteries:Codominant.Patent. No significant (greater than 50%) stenosis. Skeleton: No evidence of acute osseous abnormality in the cervical spine. Other neck: No evidence of acute abnormality. Chest: Please see concurrent CT chest for intrathoracic evaluation. CTA HEAD Severely motion limited evaluation.  Within this limitation: Anterior circulation: Lateralized course of the petrous ICAs bilaterally which appear to still have bony covering. Bilateral intracranial ICAs, MCAs and ACAs are patent proximally. Severely limited evaluation of the distal MCAs and ACAs due to patient motion. Posterior circulation: Bilateral intradural vertebral arteries, basilar artery and proximal posterior cerebral arteries are patent. Left posterior communicating artery with small left P1 PCA, anatomic variant. Limited evaluation of the distal PCAs due to patient motion. Venous sinuses: Poorly evaluated due to arterial timing  and motion. Anatomic variants: As detailed above. IMPRESSION: CT head: No evidence of acute intracranial abnormality. CTA neck: No significant (greater than 50%) stenosis. CTA head: Severely motion limited exam without evidence of a large vessel occlusion. Electronically Signed   By: Margaretha Sheffield M.D.   On: 02/13/2021 17:54   CT Angio Neck W and/or Wo Contrast  Result Date: 02/13/2021 CLINICAL DATA:  Neuro deficit, acute, stroke suspected EXAM: CT HEAD WITHOUT CONTRAST CT ANGIOGRAPHY OF THE HEAD AND NECK TECHNIQUE:  Contiguous axial images were obtained from the base of the skull through the vertex without intravenous contrast. Multidetector CT imaging of the head and neck was performed using the standard protocol during bolus administration of intravenous contrast. Multiplanar CT image reconstructions and MIPs were obtained to evaluate the vascular anatomy. Carotid stenosis measurements (when applicable) are obtained utilizing NASCET criteria, using the distal internal carotid diameter as the denominator. CONTRAST:  112m OMNIPAQUE IOHEXOL 350 MG/ML SOLN COMPARISON:  None. FINDINGS: CT HEAD Brain: No evidence of acute infarction, hemorrhage, hydrocephalus, extra-axial collection or mass lesion/mass effect. Vascular: See below. Skull: No acute fracture. Sinuses/Orbits: Visualized sinuses are clear. No acute findings in the visualized orbits. CTA NECK Aortic arch: Great vessel origins are patent. Right carotid system: Patent. No significant (greater than 50%) stenosis. Left carotid system: Patent. No significant (greater than 50%) stenosis. Vertebral arteries:Codominant.Patent. No significant (greater than 50%) stenosis. Skeleton: No evidence of acute osseous abnormality in the cervical spine. Other neck: No evidence of acute abnormality. Chest: Please see concurrent CT chest for intrathoracic evaluation. CTA HEAD Severely motion limited evaluation.  Within this limitation: Anterior circulation: Lateralized course of the petrous ICAs bilaterally which appear to still have bony covering. Bilateral intracranial ICAs, MCAs and ACAs are patent proximally. Severely limited evaluation of the distal MCAs and ACAs due to patient motion. Posterior circulation: Bilateral intradural vertebral arteries, basilar artery and proximal posterior cerebral arteries are patent. Left posterior communicating artery with small left P1 PCA, anatomic variant. Limited evaluation of the distal PCAs due to patient motion. Venous sinuses: Poorly evaluated  due to arterial timing and motion. Anatomic variants: As detailed above. IMPRESSION: CT head: No evidence of acute intracranial abnormality. CTA neck: No significant (greater than 50%) stenosis. CTA head: Severely motion limited exam without evidence of a large vessel occlusion. Electronically Signed   By: FMargaretha SheffieldM.D.   On: 02/13/2021 17:54   CT Angio Chest PE W and/or Wo Contrast  Result Date: 02/13/2021 CLINICAL DATA:  Syncope, simple, normal neuro exam Post CPR. EXAM: CT ANGIOGRAPHY CHEST WITH CONTRAST TECHNIQUE: Multidetector CT imaging of the chest was performed using the standard protocol during bolus administration of intravenous contrast. Multiplanar CT image reconstructions and MIPs were obtained to evaluate the vascular anatomy. CONTRAST:  106mOMNIPAQUE IOHEXOL 350 MG/ML SOLN COMPARISON:  Radiograph earlier today. FINDINGS: Cardiovascular: There are no filling defects within the pulmonary arteries to suggest pulmonary embolus. Normal caliber thoracic aorta. Can not assess for dissection given phase of contrast tailored to pulmonary artery evaluation. The heart is normal in size. No pericardial effusion. Mediastinum/Nodes: Adjustment of the endotracheal tube with tip now above the carina. There is an enteric tube within the esophagus, despite tube decompression the esophagus is patulous. No mediastinal adenopathy. No hilar adenopathy. No evidence of mediastinal hemorrhage. Lungs/Pleura: Multifocal airspace disease with slight areas of ground-glass nodularity throughout both lungs. There is more confluent superimposed consolidation involving the dependent right upper lobe and central right middle lobe. No septal thickening. Trace right  pleural thickening but no significant effusion. There is no debris within the trachea or central bronchi. Upper Abdomen: Suspected hepatic steatosis not well assessed on this arterial phase exam. Musculoskeletal: Possible nondisplaced left anterior fourth rib  fracture. No other acute osseous findings. Bilateral breast implants. No chest wall soft tissue abnormalities. Review of the MIP images confirms the above findings. IMPRESSION: 1. No pulmonary embolus. 2. Confluent right upper lobe consolidation consistent with pneumonia, possible aspiration. Background ground-glass nodularity throughout both lungs likely bronchiolitis, typically infectious or inflammatory. 3. Adjustment of the endotracheal tube with tip now above the carina. Enteric tube within the esophagus, despite tube decompression the esophagus is patulous. 4. Possible nondisplaced left anterior fourth rib fracture. Electronically Signed   By: Keith Rake M.D.   On: 02/13/2021 18:01   DG Chest Port 1 View  Result Date: 02/14/2021 CLINICAL DATA:  Cardiac arrest, post CPR EXAM: PORTABLE CHEST 1 VIEW COMPARISON:  CT 02/13/2021 FINDINGS: Endotracheal and nasogastric tubes have been removed. Defibrillator pads overlie the left lower chest. There is a left neck angio catheter. Persistent right lung airspace disease, increased in comparison to prior exam. No large pleural effusion or visible pneumothorax. Bones are unchanged. IMPRESSION: Endotracheal tube and nasogastric tubes have been removed. Persistent right lung airspace disease, increased from prior exam. Electronically Signed   By: Maurine Simmering M.D.   On: 02/14/2021 12:26   EEG adult  Result Date: 02/13/2021 Lora Havens, MD     02/14/2021  9:34 AM Patient Name: Susan [name] MRN: [id number] Epilepsy Attending: Lora Havens Referring Physician/Provider: Hayden Pedro, NP Date: 02/13/2021 Duration: 21.05 mins Patient history: 40 y.o. female who presented to the ED for witnessed-seizure like activity at home witnessed by fiance then with EMS with VF arrest s/p defibrillation and 10 minutes of CPR with PEA until achieving ROSC. EEG to evaluate for seizure. Level of alertness: comatose AEDs during EEG study: Keppra, propofol, Versed Technical  aspects: This EEG study was done with scalp electrodes positioned according to the 10-20 International system of electrode placement. Electrical activity was acquired at a sampling rate of 500Hz  and reviewed with a high frequency filter of 70Hz  and a low frequency filter of 1Hz . EEG data were recorded continuously and digitally stored. Description: EEG showed continuous generalized low amplitude 3 to 6 Hz theta-delta slowing.  Hyperventilation and photic stimulation were not performed.   ABNORMALITY - Continuous slow, generalized IMPRESSION: This study is suggestive of moderate to severe diffuse encephalopathy, nonspecific etiology. No seizures or epileptiform discharges were seen throughout the recording. Priyanka Barbra Sarks   Overnight EEG with video  Result Date: 02/14/2021 Lora Havens, MD     02/14/2021 11:14 AM Patient Name: Susan [name] MRN: [id number] Epilepsy Attending: Lora Havens Referring Physician/Provider: Hayden Pedro, NP Duration: 02/13/2021 2013 to 02/14/2021 1027  Patient history: 40 y.o. female who presented to the ED for witnessed-seizure like activity at home witnessed by fiance then with EMS with VF arrest s/p defibrillation and 10 minutes of CPR with PEA until achieving ROSC. EEG to evaluate for seizure.  Level of alertness: awake, asleep  AEDs during EEG study: Keppra, propofol, Versed  Technical aspects: This EEG study was done with scalp electrodes positioned according to the 10-20 International system of electrode placement. Electrical activity was acquired at a sampling rate of 500Hz  and reviewed with a high frequency filter of 70Hz  and a low frequency filter of 1Hz . EEG data were recorded continuously and digitally stored.  Description: No  clear posterior dominant rhythm was seen. Sleep was characterized by sleep spindles (12 to 14 Hz), maximal frontocentral region.  EEG showed continuous generalized predominantly 5 to 9 Hz theta and alpha activity as well as intermittent  generalized 2 to 3 Hz delta slowing.  Hyperventilation and photic stimulation were not performed.    ABNORMALITY - Continuous slow, generalized  IMPRESSION: This study is suggestive of moderate diffuse encephalopathy, nonspecific etiology. No seizures or epileptiform discharges were seen throughout the recording.  Priyanka O Yadav   [place] EKG SITE RITE  Result Date: 02/13/2021 If Site Rite image not attached, placement could not be confirmed due to current cardiac rhythm.    Medications:     Current Medications:  folic acid  1 mg Per Tube Daily   heparin  5,000 Units Subcutaneous Q8H   insulin aspart  2-6 Units Subcutaneous Q4H   multivitamin with minerals  1 tablet Per Tube Daily   polyethylene glycol  17 g Per Tube Daily   sodium chloride flush  10-40 mL Intracatheter Q12H   thiamine injection  100 mg Intravenous Daily    Infusions:  amiodarone 60 mg/hr (02/14/21 1131)   amiodarone     cefTRIAXone (ROCEPHIN)  IV Stopped (02/13/21 2340)   levETIRAcetam 1,000 mg (02/14/21 1300)   norepinephrine (LEVOPHED) Adult infusion 10 mcg/min (02/14/21 1451)   potassium PHOSPHATE IVPB (in mmol) 30 mmol (02/14/21 1645)      Patient Profile   40 y/o female w/ no prior cardiac history. PMH includes ETOH use, alcoholic pancreatitis, and depression, on Prozac. H/o pregnancy. Gave birth 20 months ago. Pregnancy c/b gestational DM and preeclampsia.  Admitted w/ VFib arrest x 2, in setting of acute COVID infection. LVEF severely reduced ~30%  Assessment/Plan   1. COVID 19 Infection  - management per PCCM   2. VFib Arrest x 2 - OOH VT/VF arrest 9/6. Immediate BSCPR + defib w/ ROSC ~10 min  - In setting of hypokalemia/hypomagnesemia w/ prolonged QT interval  - recurrent in hospital PMVT arrest 9/7 w/ 2 min CPR + defib. In setting of hypokalemia (3.1). Post arrest EKG w/ QT/QTC 492/549 ms, after IV Zofran administration. No STE  - Rhythm now stable on tele  - Now on amio gtt at 60/hr. Continue   - Supp K. Keep K > 4.0 and Mg >2.0 - Avoid QT prolonging agents - Also ? Viral Myocarditis, in setting of active COVID 19 infection. EF down ~30%  - Check HS trop and ESR  - Will need cMRI + LHC  - Will need EP assessment for possible ICD prior to d/c   3. Acute Systolic Heart Failure>>Shock   - Shock post arrest. Lactic Acid 3.6. Hypotensive, now on NE 10 - Echo w/ severely reduced LVEF ~30%. No prior study for comparison  - Etiology uncertain. ? Viral CM vs stress induced. Also ? Possible ETOH CM or familial (Maternal Uncle s/p heart transplant in his 22s) - Given recurrent ventricular arrhythmias, will ultimately need LHC to r/o coronary ischemia/CAD - Check HS trop and ESR - Will need cMRI to assess for myocarditis and r/o infiltrative CM  - if above w/u unremarkable, will need genetic testing - She is fluid overloaded, JVD elevated to jaw. Denies resting dyspnea.  - GDMT limited currently by hypotension, currently on NE 10   - She has central access. Will check Co-ox and follow CVPs  - diurese w/ IV Lasix w/ aggressive K supplementation - follow lactic acid for clearance  -  reduction in ETOH intake imperative   4. Hypokalemia/ Hypomagnesemia  - Supp. Keep K > 4.0 and Mg > 2.0   5. Prolonged QT Interval  - on Prozac for depression PTA. Currently on hold - Avoid other QT prolonging agents. D/c PRN Zofran - Keep K > 4.0 and Mg > 2.0   6. Seizures/ Neuro  - w/ post arrest memory loss  - Neuro following - EEG suggestive of diffuse encephalopathy, nonspecific etiology. No seizures or epileptiform discharges - on IV Keppra   7. ETOH Abuse  - H/o heavy use in the past, prior admit for ETOH pancreatitis  - ETOH level elevated on admit, c/w intoxication - Likely chronic abuse, MCV high at 103. Mg 1.7 on admit, Hepatic enzymes chronically elevated  - CIWA protocol per CCM  8. Hyperglycemia - h/o gestational DM  - Glucose severely elevated 580 - Insulin gtt per CCM  - Check  Hgb A1c (last 5.3 in 2021)  9. Elevated LFTs - Chronically elevated, likely 2/2 ETOH use - AST 170 - ALT 52   10. ? Aspiration PNA - CCM covering w/ ceftriaxone    Length of Stay: 1  Brittainy Simmons, PA-C  02/14/2021, 5:20 PM  Advanced Heart Failure Team Pager [phone] (M-F; 7a - 5p)  Please contact [facility] for night-coverage after hours (4p -7a ) and weekends on [email or web address]   Agree with above.   Difficult case.   40 y/o woman with h/o ETOH abuse, chronic pancreatitis, depression. FHx of heart disease.   Admitted with Polymorphic VT/VF arrest in the setting of COVID 19 infection, acute ETOH intoxication and electrolyte abnormalities with QT prolongation. Intubated and shocked in field. Extubated here and had recurrent arrest.   Now on NE 8 and amio drip. Rhythm stable. She has no recollection of event and is struggling with her memory in general.  Echo EF 30-35% with regional WMAs (septum, inferolateral wall and apex)  Hs trop 50   ECG without significant ischemic changes. QTc 583  Co-ox 58%   General:  Awake/alert No resp difficulty HEENT: normal Neck: supple. CVP 10  + R EJ line Carotids 2+ bilat; no bruits. No lymphadenopathy or thryomegaly appreciated. Cor: PMI nondisplaced. Regular rate & rhythm. No rubs, gallops or murmurs. Lungs: clear Abdomen: soft, nontender, nondistended. No hepatosplenomegaly. No bruits or masses. Good bowel sounds. Extremities: no cyanosis, clubbing, rash, edema Neuro: alert. Poor short-term memory cranial nerves grossly intact. moves all 4 extremities w/o difficulty. Affect pleasant  My initial concerns were Covid myocarditis and or primary ischemic event however hstrop is only mildly elevated and this is after VF x 2 so event my be primarily electrical in setting of QT prolongation/electrolyte abnormalities and possible pre-existing cardiomyopathy. There is no clear FHx of prolonged QT syndromes.   That said, the regional wall motion  abnormalities in setting of PMVT are concerning for ischemic heart disease and I think the first order of business is to exclude high-grade CAD. I discussed with her mother Levander Campion) by phone and obtained consent for R/L cath in am. If cath negative will follow with cMRI.   Cardiac output stable on NE. Will continue.  Continue amio now as it seems to be stabilizing rhythm. If has recurrent event, increasing ectopy or widening QT would switch to lidocaine. Keep Mg > 2.0 K > 4.0. Avoid QT prolonging agents.   Will get daily ECGs. Continue to follow co-ox and CVP.   Will not start heparin at this juncture without active ischemic  symptoms.   CRITICAL CARE Performed by: Glori Bickers  Total critical care time: 60 minutes  Critical care time was exclusive of separately billable procedures and treating other patients.  Critical care was necessary to treat or prevent imminent or life-threatening deterioration.  Critical care was time spent personally by me (independent of midlevel providers or residents) on the following activities: development of treatment plan with patient and/or surrogate as well as nursing, discussions with consultants, evaluation of patient's response to treatment, examination of patient, obtaining history from patient or surrogate, ordering and performing treatments and interventions, ordering and review of laboratory studies, ordering and review of radiographic studies, pulse oximetry and re-evaluation of patient's condition.   Glori Bickers, MD  9:11 PM   Addendum: Report by Dr. Radford Pax raises possibility of laminated apical septal thrombus on Definity images. I feel this is equivocal but will start heparin until we can get MRI to further evaluate.   Glori Bickers, MD  9:41 PM

## 2021-02-14 NOTE — Progress Notes (Signed)
Peripherally Inserted Central Catheter Placement  The IV Nurse has discussed with the patient and/or persons authorized to consent for the patient, the purpose of this procedure and the potential benefits and risks involved with this procedure.  The benefits include less needle sticks, lab draws from the catheter, and the patient may be discharged home with the catheter. Risks include, but not limited to, infection, bleeding, blood clot (thrombus formation), and puncture of an artery; nerve damage and irregular heartbeat and possibility to perform a PICC exchange if needed/ordered by physician.  Alternatives to this procedure were also discussed.  Bard Power PICC patient education guide, fact sheet on infection prevention and patient information card has been provided to patient /or left at bedside.    PICC Placement Documentation  PICC Triple Lumen 44/81/85 PICC Right Basilic 41 cm 2 cm (Active)  Indication for Insertion or Continuance of Line Vasoactive infusions 02/14/21 0000  Exposed Catheter (cm) 2 cm 02/14/21 0000  Site Assessment Clean;Dry;Intact 02/14/21 0000  Lumen #1 Status Flushed;Blood return noted;Saline locked 02/14/21 0000  Lumen #2 Status Flushed;Blood return noted;Saline locked 02/14/21 0000  Lumen #3 Status Flushed;Blood return noted;Saline locked 02/14/21 0000  Dressing Type Transparent 02/14/21 0000  Dressing Status Clean;Dry;Intact 02/14/21 0000  Antimicrobial disc in place? Yes 02/14/21 0000  Safety Lock Not Applicable 63/14/97 0263  Line Care Connections checked and tightened 02/14/21 0000  Dressing Intervention New dressing 02/14/21 0000  Dressing Change Due 02/20/21 02/14/21 0000       Azim Gillingham, Cathlyn Parsons 02/14/2021, 12:20 AM

## 2021-02-14 NOTE — Procedures (Signed)
Patient Name: Susan [name]  MRN: [id number]  Epilepsy Attending: Lora Havens  Referring Physician/Provider: Hayden Pedro, NP Date: 02/13/2021 Duration: 21.05 mins  Patient history: 40 y.o. female who presented to the ED for witnessed-seizure like activity at home witnessed by fiance then with EMS with VF arrest s/p defibrillation and 10 minutes of CPR with PEA until achieving ROSC. EEG to evaluate for seizure.   Level of alertness: comatose  AEDs during EEG study: Keppra, propofol, Versed  Technical aspects: This EEG study was done with scalp electrodes positioned according to the 10-20 International system of electrode placement. Electrical activity was acquired at a sampling rate of 500Hz  and reviewed with a high frequency filter of 70Hz  and a low frequency filter of 1Hz . EEG data were recorded continuously and digitally stored.   Description: EEG showed continuous generalized low amplitude 3 to 6 Hz theta-delta slowing.  Hyperventilation and photic stimulation were not performed.     ABNORMALITY - Continuous slow, generalized  IMPRESSION: This study is suggestive of moderate to severe diffuse encephalopathy, nonspecific etiology. No seizures or epileptiform discharges were seen throughout the recording.  Andrya Roppolo Barbra Sarks

## 2021-02-14 NOTE — Progress Notes (Signed)
Per CCM team, okay to stop Targeted Temperature Management - Normothermia. Artic Sun pads remaining on patient at this time, but treatment stopped.   Patient alert, following commands, fiance Kyle in room at time.

## 2021-02-14 NOTE — Progress Notes (Signed)
Cardiologist Dr. Haroldine Laws at bedside, spoke to wife with writer present to perform cardiac cath in the morning. Consent signed and placed in patients chart.   Per cardiologist patient not to go to MRI till post cardiac cath.   Patient will be started on heparin gtt due to suspected thrombus per order.

## 2021-02-14 NOTE — Procedures (Addendum)
Patient Name: Susan [name]  MRN: [id number]  Epilepsy Attending: Lora Havens  Referring Physician/Provider: Hayden Pedro, NP Duration: 02/13/2021 2013 to 02/14/2021 1027   Patient history: 40 y.o. female who presented to the ED for witnessed-seizure like activity at home witnessed by fiance then with EMS with VF arrest s/p defibrillation and 10 minutes of CPR with PEA until achieving ROSC. EEG to evaluate for seizure.    Level of alertness: awake, asleep   AEDs during EEG study: Keppra, propofol, Versed   Technical aspects: This EEG study was done with scalp electrodes positioned according to the 10-20 International system of electrode placement. Electrical activity was acquired at a sampling rate of 500Hz  and reviewed with a high frequency filter of 70Hz  and a low frequency filter of 1Hz . EEG data were recorded continuously and digitally stored.    Description: No clear posterior dominant rhythm was seen. Sleep was characterized by sleep spindles (12 to 14 Hz), maximal frontocentral region.  EEG showed continuous generalized predominantly 5 to 9 Hz theta and alpha activity as well as intermittent generalized 2 to 3 Hz delta slowing.  Hyperventilation and photic stimulation were not performed.      ABNORMALITY - Continuous slow, generalized   IMPRESSION: This study is suggestive of moderate diffuse encephalopathy, nonspecific etiology. No seizures or epileptiform discharges were seen throughout the recording.   Pamela Maddy Barbra Sarks

## 2021-02-14 NOTE — Progress Notes (Signed)
Neurology Progress Note  Brief HPI: 40 y.o. female with PMHx of alcohol abuse, alcoholic hepatitis without ascites, alcoholic ketoacidosis, pancreatitis, HTN, GIB, and MDD who presented to the ED 9/6 via EMS for evaluation of witnessed seizure-like activity at home followed by no detectable pulse per husband, who had started CPR, and a second episode of witnessed seizure-like activity with EMS who also noted VF arrest on arrival, then PEA arrest. CPR was continued by EMS. Total down time per husband was 10-15 minutes prior to achieving ROSC. There was concern for seizure activity versus posturing following intubation in the ED.   02/14/2021: Per fianc at bedside, patient was last admitted with alcohol withdrawal in 2019. He states that he believes that she drinks on 3-4 days each week and has "a couple" of glasses of wine when she drinks. He states that he is unsure how much she drinks because he was unaware that she had been drinking prior to arrival and believes that she may be hiding her drinking from him.   Subjective: Patient was able to wake with sedation and follow commands once sedation was paused for about 10 minutes. Plan for weaning of sedation and extubation today per  ICU team SARS Coronavirus PCR came back positive after admission.   Exam: Vitals:   02/14/21 0630 02/14/21 0700  BP: 91/69   Pulse: 69   Resp: 20   Temp: 99.1 F (37.3 C) 99 F (37.2 C)  SpO2: 99%    Gen: Intubated and sedated in the ICU, in no acute distress Resp: respirations assisted via mechanical ventilation, spontaneous respirations present over set ventilator rate Abd: soft to palpation throughout, non-distended  Neuro: Mental Status: Initially sedated and does not follow commands. While on sedation, she has nystagmus in lateral gaze bilaterally that resolves off of sedation.  With sedation paused for approximately 10 minutes she is able to open her eyes to voice, fixate and track examiner, and follow  simple commands. She remains drowsy and requires constant stimulation for participation.  Cranial Nerves: PERRL, EOMI, will fixate and track examiner, face appears symmetric resting and with grimace but assessment is limited due to oral ETT in place and secured, hearing is intact to voice, cough and gag reflexes are intact, head is grossly midline, Not able to protrude tongue.  Motor: Initially, she does not respond to noxious stimuli throughout, with sedation paused she withdraws bilateral lower extremities to noxious stimuli with restless movements of bilateral upper extremities. She eventually is able to squeeze on command, give a thumbs up, and shows examiner the middle finger with a scowl when asked to show 2 fingers.  Tone and bulk are normal. There are no noted involuntary movements or contractions, myoclonic jerking, tremors, or shaking. She no longer has abnormal wrist extension or posturing movements on examination.  Sensory: Withdraws BLE to noxious stimuli, does not withdraw BUE to noxious stimuli but as she woke more from sedation, she had restless movements of BUE that increased with stimulation. DTR: 2+ and symmetric patellae, biceps, and brachioradialis.  Plantars: Mute bilaterally Gait: Deferred  Pertinent Labs: CBC    Component Value Date/Time   WBC 8.7 02/14/2021 0325   RBC 3.41 (L) 02/14/2021 0325   HGB 12.2 02/14/2021 0325   HCT 35.2 (L) 02/14/2021 0325   PLT 230 02/14/2021 0325   MCV 103.2 (H) 02/14/2021 0325   MCH 35.8 (H) 02/14/2021 0325   MCHC 34.7 02/14/2021 0325   RDW 12.7 02/14/2021 0325   LYMPHSABS 1.3 02/19/2020 0600  MONOABS 0.3 02/19/2020 0600   EOSABS 0.1 02/19/2020 0600   BASOSABS 0.1 02/19/2020 0600   CMP     Component Value Date/Time   NA 135 02/14/2021 0325   K 3.8 02/14/2021 0325   CL 103 02/14/2021 0325   CO2 23 02/14/2021 0325   GLUCOSE 130 (H) 02/14/2021 0325   BUN 7 02/14/2021 0325   CREATININE 0.61 02/14/2021 0325   CALCIUM 7.9 (L)  02/14/2021 0325   PROT 6.3 (L) 02/13/2021 1651   ALBUMIN 3.0 (L) 02/13/2021 1651   AST 307 (H) 02/13/2021 1651   ALT 58 (H) 02/13/2021 1651   ALKPHOS 92 02/13/2021 1651   BILITOT 0.8 02/13/2021 1651   GFRNONAA >60 02/14/2021 0325   GFRAA >60 02/19/2020 0600   Alcohol Level    Component Value Date/Time   ETH 104 (H) 02/13/2021 1758   Drugs of Abuse     Component Value Date/Time   LABOPIA NONE DETECTED 02/13/2021 1758   COCAINSCRNUR NONE DETECTED 02/13/2021 1758   COCAINSCRNUR Negative 01/18/2019 0739   LABBENZ POSITIVE (A) 02/13/2021 1758   AMPHETMU NONE DETECTED 02/13/2021 1758   THCU NONE DETECTED 02/13/2021 1758   LABBARB NONE DETECTED 02/13/2021 1758    Urinalysis    Component Value Date/Time   COLORURINE YELLOW 02/13/2021 1758   APPEARANCEUR HAZY (A) 02/13/2021 1758   LABSPEC 1.016 02/13/2021 1758   PHURINE 6.0 02/13/2021 1758   GLUCOSEU 150 (A) 02/13/2021 1758   HGBUR MODERATE (A) 02/13/2021 1758   BILIRUBINUR NEGATIVE 02/13/2021 1758   KETONESUR NEGATIVE 02/13/2021 1758   PROTEINUR 100 (A) 02/13/2021 1758   NITRITE NEGATIVE 02/13/2021 1758   LEUKOCYTESUR NEGATIVE 02/13/2021 1758   Lab Results  Component Value Date   SARSCOV2NAA POSITIVE (A) 02/13/2021   [place] Not Detected 03/20/2020   [place] NEGATIVE 02/17/2020   [place] NEGATIVE 11/05/2019   Imaging Reviewed: CT head 02/13/2021: No evidence of acute intracranial abnormality.   CTA neck: No significant (greater than 50%) stenosis.   CTA head: Severely motion limited exam without evidence of a large vessel occlusion.  DG Chest portable 02/13/2021: 1. Right mainstem intubation. Technologist notes state clinician is aware and is retracting the endotracheal tube. 2. Patchy airspace disease in the right perihilar lung, atelectasis versus pneumonia. 3. Central pulmonary vascular prominence.  Overnight EEG 02/13/2021 - 02/14/2021: "This study is suggestive of moderate diffuse encephalopathy,  nonspecific etiology. No seizures or epileptiform discharges were seen throughout the recording."  Assessment:  40 y.o. female who presented to the ED 9/6 for witnessed-seizure like activity at home by fiance then with EMS with subsequent VF arrest s/p defibrillation and 10 minutes of CPR with PEA until achieving ROSC. She was initiated on pressors for hypotension prior to transfer to ICU. - Examination reveals patient who wakes to voice with increasing responsiveness once sedation is paused. She is able to fixate and track examiner and follow simple commands. ICU team with plans to wean sedation and extubate patient if she can tolerate. She no longer has abnormal extremity movements or roving eye movements on examination. She does not have any tremors or noted myoclonus on examination.  - CTH imaging is without evidence of acute intracranial abnormality or significant stenosis.  - Differentials include new onset seizure / status epilepticus versus posturing. Patient does have a history of alcohol abuse and ETOH withdrawal last reported in 2019 with risk for ETOH withdrawal seizures. LFTs are elevated with AST > ALT. Alcohol level of 104, COVID-19 PCR positive with CXR evidence of  patchy airspace disease in the right perihilar lung concerning for atelectasis versus pneumonia.  - Seized x 2 prior to cardiac arrest with concern for seizure-like activity versus posturing in the ED. EEG overnight is without evidence of seizures.  - Given cardiac arrest prior in the field, there is concern for possible hypoxic/anoxic brain injury    Impression:  -Concern for seizure activity versus abnormal posturing -S/p cardiac arrest with 10 minutes to ROSC  Recommendations: - Discontinue LTM EEG in the absence of seizure activity overnight - MRI brain wwo when able to obtain - Continue inpatient seizure precautions - Continue Keppra 1,000 mg IV BID - 2 mg IV Ativan PRN for seizure activity lasting > 5 minutes and  notify neurology   Anibal Henderson, AGACNP-BC [facility] [phone]  Electronically signed: Dr. Kerney Elbe

## 2021-02-14 NOTE — Progress Notes (Signed)
[facility] Progress Note Patient Name: Susan [name] DOB: 10/01/1980 MRN: [id number]   Date of Service  02/14/2021  HPI/Events of Note  Lactic acid is 3.8, patient is s/p cardiac arrest.  eICU Interventions  Will trend the lactic acid level.        Merland Holness U Aayush Gelpi 02/14/2021, 2:04 AM

## 2021-02-14 NOTE — Procedures (Signed)
Extubation Procedure Note  Patient Details:   Name: Susan [name] DOB: 1981-06-05 MRN: [id number]   Airway Documentation:    Vent end date: 02/14/21 Vent end time: 0932   Evaluation  O2 sats: stable throughout Complications: No apparent complications Patient did tolerate procedure well. Bilateral Breath Sounds: Clear   Patient extubated per MD order &placed on 4L [place].Patient had good cuff leak prior to extubation.Patient able to speak & cough post extubation.Currently breathing well in no distress.  Kathie Dike 02/14/2021, 9:40 AM

## 2021-02-14 NOTE — Progress Notes (Signed)
ANTICOAGULATION CONSULT NOTE - Initial Consult  Pharmacy Consult for IV heparin Indication: apical mural thrombus  Allergies  Allergen Reactions   Ibuprofen Anaphylaxis   Ondansetron Hcl Other (See Comments)    Led to QT prolongation and cardiac arrest   Chlorhexidine Other (See Comments)   Penicillins Nausea And Vomiting   Effexor [Venlafaxine] Palpitations and Other (See Comments)    Panic attacks   Latex Rash    Tape irritates the skin/ please use paper tape   Tape Rash    pls use paper tape    Patient Measurements: Height: 5' 2"  (157.5 cm) Weight: 58.7 kg (129 lb 6.6 oz) IBW/kg (Calculated) : 50.1 Heparin Dosing Weight: 58.7 kg  Vital Signs: Temp: 98.1 F (36.7 C) (09/07 2000) Temp Source: Oral (09/07 2000) BP: 122/96 (09/07 2100) Pulse Rate: 72 (09/07 2100)  Labs: Recent Labs    02/13/21 1651 02/13/21 1657 02/14/21 0021 02/14/21 0325 02/14/21 1203 02/14/21 1340 02/14/21 1841  HGB 13.3   < > 11.9* 12.2 10.3*  --   --   HCT 41.5   < > 35.0* 35.2* 32.0*  --   --   PLT 240  --   --  230 222  --   --   CREATININE 0.76   < >  --  0.61 0.63 0.71  --   TROPONINIHS  --   --   --   --   --   --  50*   < > = values in this interval not displayed.    Estimated Creatinine Clearance: 74.7 mL/min (by C-G formula based on SCr of 0.71 mg/dL).   Medical History: Past Medical History:  Diagnosis Date   AKI (acute kidney injury) ([place]) 06/15/2019   admitted with delhydration r/o arf   Alcohol abuse    Alcoholic hepatitis without ascites    Depression    Family history of adverse reaction to anesthesia    MOTHER HAD PANIC ATTACKS & NAUSEA  AFTER   Gastritis    GIB (gastrointestinal bleeding)    Hypertension    Iron deficiency anemia    Pancreatitis    PONV (postoperative nausea and vomiting)     Medications:  Infusions:   amiodarone 30 mg/hr (02/14/21 1800)   cefTRIAXone (ROCEPHIN)  IV 2 g (02/14/21 1834)   heparin     levETIRAcetam Stopped (02/14/21 1332)    norepinephrine (LEVOPHED) Adult infusion 10 mcg/min (02/14/21 1800)   potassium PHOSPHATE IVPB (in mmol) 85 mL/hr at 02/14/21 1800    Assessment: 40 yo female admitted after cardiac arrest.  Pharmacy asked to start IV heparin for apical mural thrombus seen on ECHO.  Not taking anticoagulants PTA.  Goal of Therapy:  Heparin level 0.3-0.7 units/ml Monitor platelets by anticoagulation protocol: Yes   Plan:  Start IV Heparin with bolus of 3000 units x 1. Then start heparin gtt at 800 units/hr. Check heparin level in 6 hrs. Daily heparin level and CBC. F/u plans for cardiac cath.  [place] Crane, Roylene Reason, BCCP Clinical Pharmacist  02/14/2021 10:00 PM   [facility] pharmacy phone numbers are listed on [email or web address]

## 2021-02-14 NOTE — Procedures (Signed)
Cardiopulmonary Resuscitation Note  Susan [name]  [id number]  [birth year]  Date:02/14/21  Time:11:31 AM   Provider Performing:Meziah Blasingame Cleon Dew   Procedure: Cardiopulmonary Resuscitation [phone])  Indication(s) Vfib/Torsades  Consent N/A  Anesthesia N/A   Time Out N/A   Sterile Technique Hand hygiene, gloves   Procedure Description Called to patient's room for CODE BLUE. Initial rhythm was Vfib/Vtach. Patient received high quality chest compressions for 2 minutes with defibrillation. Return of spontaneous circulation was achieved. Cardiology was consulted post arrest.   Family at bedside.   Complications/Tolerance N/A   EBL N/A   Specimen(s) N/A  Estimated time to ROSC: 2 minutes. Started at 1116. End 1118  [employer]., MSN, APRN, AGACNP-BC [facility]  02/14/2021 , 11:34 AM  Please see [email or web address] for pager details  If no response, please call [phone] After hours, please call Elink at [phone]

## 2021-02-14 NOTE — TOC Initial Note (Signed)
Transition of Care [facility]) - Initial/Assessment Note    Patient Details  Name: Susan [name] MRN: [id number] Date of Birth: 08-Aug-1980  Transition of Care [facility]) CM/SW Contact:    Tom-Johnson, Renea Ee, RN Phone Number: 02/14/2021, 12:17 PM  Clinical Narrative:                 CM consulted to obtain Advanced Directives and POA information. CM spoke with patient via phone and also spoke with Fiance in the hallway of the unit. Patient states she lives with her Fiance and their 78 old son and also her 50 year old daughter. States she does everything independently and did not need any assist prior to hospitalization. CM spoke with her about Advanced Directives and what it means. Patient voiced understanding and CM told her Chaplain consult will be made because they do the referral.Order for Chaplain consult placed. TOC will continue to follow with needs.    Barriers to Discharge: Continued Medical Work up   Patient Goals and CMS Choice Patient states their goals for this hospitalization and ongoing recovery are:: To go home      Expected Discharge Plan and Services   In-house Referral: Chaplain, Nutrition (For Advanced Directives) Discharge Planning Services: CM Consult   Living arrangements for the past 2 months: [place]                                      Prior Living Arrangements/Services Living arrangements for the past 2 months: Single Family Home Lives with:: Significant Other (Fiance) Patient language and need for interpreter reviewed:: Yes Do you feel safe going back to the place where you live?: Yes      Need for Family Participation in Patient Care: Yes (Comment) Care giver support system in place?: Yes (comment)   Criminal Activity/Legal Involvement Pertinent to Current Situation/Hospitalization: No - Comment as needed  Activities of Daily Living      Permission Sought/Granted Permission sought to share  information with : Case Manager, Customer service manager Permission granted to share information with : Yes, Verbal Permission Granted              Emotional Assessment Appearance:: Appears stated age Attitude/Demeanor/Rapport: Engaged Affect (typically observed): Accepting, Appropriate, Hopeful Orientation: : Oriented to Self, Oriented to Place, Oriented to  Time, Oriented to Situation Alcohol / Substance Use: Not Applicable Psych Involvement: No (comment)  Admission diagnosis:  Cardiac arrest [facility]) [I46.9] Patient Active Problem List   Diagnosis Date Noted   Cardiac arrest ([place]) 02/13/2021   ARF (acute renal failure) ([place]) 02/18/2020   Ketoacidosis 02/18/2020   SVD (11/3) 04/13/2019   AMA (advanced maternal age) multigravida 35+ 04/02/2019   Positive GBS  04/02/2019   Chronic hypertension 04/02/2019   PPROM 04/02/2019   Alcohol use 01/17/2019   Premature cervical dilation in second trimester 01/16/2019   Alcohol-induced pancreatitis 03/25/2018   Foreign body alimentary tract, subsequent encounter 03/25/2018   AKI (acute kidney injury) ([place]) 31/49/7026   Alcoholic hepatitis without ascites 37/85/8850   Alcoholic hepatitis 27/74/1287   Acute pancreatitis 03/22/2018   Gastritis 03/03/2018   Depression 03/03/2018   Iron deficiency anemia 03/03/2018   Alcohol withdrawal ([place]) 86/76/7209   Metabolic acidosis 47/02/6282   GI bleed 09/05/2016   Alcohol abuse 06/04/2016   PCP:  Cathlean Sauer, MD Pharmacy:   [facility] [place] - [address] [place] [address] Phone: [phone] Fax: [phone]     Social Determinants of Health (SDOH) Interventions    Readmission Risk Interventions No flowsheet data found.

## 2021-02-14 NOTE — Progress Notes (Addendum)
NAME:  Susan [name], MRN:  [id number], DOB:  December 31, 1980, LOS: 1 ADMISSION DATE:  02/13/2021, CONSULTATION DATE: 9/6 REFERRING MD: Dr. 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  She has recent admissions to the [facility] for alcoholic ketoacidosis, pancreatitis.  She also has recent office visits for major depressive disorder.  She was at home in her usual state of health until 9/6 when she had witnessed seizure-like activity.  This was witnessed by her fianc who called EMS and started CPR.  Upon EMS arrival the patient was reportedly awake, although it is unclear what her true level of consciousness was.  She did again had seizure-like activity which progressed to cardiac arrest.  Initial rhythm was ventricular fibrillation and she was defibrillated.  She then underwent 10 additional minutes of ACLS with PEA at each additional rhythm check until ROSC.  She was transported to [facility] emergency department she was intubated upon arrival.  No purposeful movements were identified in the emergency department. PCCM asked to admit.  She remains critically ill.  Pertinent  Medical History    has a past medical history of AKI (acute kidney injury) ([place]) (06/15/2019), Alcohol abuse, Alcoholic 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   Significant Hospital Events: Including procedures, antibiotic start and stop dates in addition to other pertinent events   9/6 admit for sz, cardiac arrest.   Interim History / Subjective:  Intbated, sedated  Prop 50, fent 200, levo 10  Tmax 99.1 on TTM  Tmax 99.1, +52m, 775 UOP  Unable to obtain subjective  evaluation due to patient status   Objective   Blood pressure 91/69, pulse 69, temperature 99 F (37.2 C), resp. rate 20, height 5' 2"  (1.575 m), weight 58.7 kg, SpO2 99 %, not currently breastfeeding.    Vent Mode: PRVC FiO2 (%):  [40 %-100 %] 40 % Set Rate:  [18 bmp-20 bmp] 20 bmp Vt Set:  [400 mL] 400 mL PEEP:  [5 cmH20] 5 cmH20 Plateau Pressure:  [12 cmH20-14 cmH20] 13 cmH20   Intake/Output Summary (Last 24 hours) at 02/14/2021 03007Last data filed at 02/14/2021 0600 Gross per 24 hour  Intake 1319.13 ml  Output 775 ml  Net 544.13 ml    Filed Weights   02/13/21 1900 02/14/21 0500  Weight: 55.3 kg 58.7 kg    Examination: General:  in bed, restless, NAD HEENT: MM pink/moist, anicteric, atraumatic Neuro: GCS 11t, RASS +1, PERRL 3104mCV: S1S2, NRS, no m/r/g appreciated PULM:  air movement in all lobes, Trachea midline, chest expansion symmetric GI: soft, bsx4 active, non tender Extremities: warm/dry, no pretibial edema, capillary refill less than 3 seconds  Skin: no rashes or lesions  Resolved Hospital Problem list     Assessment & Plan:   Cardiac arrest: Initial rhythm VF.  etiology not certain, electrolyte abnormalities are certainly a concern. Her drinking history is not indicative of withdrawal.  CTA head and CTA chest without obvious cause.  -Neurology following appreciate assistance -GCS 11t on exam. Plan to stop TTM today and wean to extubate -EEG per neurology -Follow up echo -PRN  EKG -Goal K above 4, goal MG above 2  Acute hypoxemic respiratory failure Aspiration PNA Secondary to arrest. No leukocytosis -LTVV strategy  with tidal volumes of 4-8 cc/kg ideal body weight -Goal plateau pressures less than 30 and driving pressures less than 15 -Wean PEEP/FiO2 for SpO2 92-98% -VAP bundle -Daily SAT and SBT. Following commands on exam. Hope to extubate today -PAD bundle with Propofol gtt and fentanyl gtt -Follow intermittent CXR and ABG PRN -Continue  ceftriaxone, flagyl. Narrow as cultures result -Follow up BC/UC  Lactic acidosis Secondary to arrest. Lactate 3.8>2.2 -Continue supportive care -Goal MAP greater than 65  Hypotension Suspect medication induced. Prop 50, fent 200, levo 10. Lactate improving. WBC WNL. On TTM. Feel more medication induced vs sepsis/cardiac. -Continue levophed. Goal MAP 65 or greater. Adjust to goal -Weaning medication to extubate today. -Follow up echo -Abx as discussed -Follow up cultures  DM: following pregnancy 13 months prior. Family does not feels she has not been treating this well.  BG 91-241 -Blood Glucose goal 140-180. -Continue SSI at current scale. Avoid hypoglycemia.    Seizure: likely alcohol withdrawal seizure -Management and workup per neurology. -AED per neurology -On EEG. Monitor results -MRI brain when able -Continue prn ativan for seizure  Transaminitis ?shock liver vs alcoholic hepatitis -AM LFT -If rising will check RUQ [place]  Acute encephalopathy: s/p arrest Ammonia 59 -Neuro workup as discussed -Wean sedation. Goal rass 0 to -1 -lactulose TID x1 day   Hypokalemia-Improved Hypomagnesia -Goal K above 4, goal MG above 2 -replete -follow up on AM labs  Alcohol abuse history, but recently has been drinking a glass of wine -4 days per week per husband. ETOH 104 on admission -Continue thiamine, folate, MVI -Monitor for withdrawal once off propofol  Best Practice (right click and "Reselect all SmartList Selections" daily)   Diet/type: NPO DVT prophylaxis: prophylactic heparin  GI prophylaxis: PPI Lines: Central line Foley:  Yes, and it is still needed Code Status:  full code Last date of multidisciplinary goals of care discussion [9/6]  Labs   CBC: Recent Labs  Lab 02/13/21 1651 02/13/21 1657 02/13/21 1745 02/14/21 0021 02/14/21 0325  WBC 8.1  --   --   --  8.7  HGB 13.3 14.6 12.9 11.9* 12.2  HCT 41.5 43.0 38.0 35.0* 35.2*  MCV 109.5*  --   --   --   103.2*  PLT 240  --   --   --  230     Basic Metabolic Panel: Recent Labs  Lab 02/13/21 1651 02/13/21 1657 02/13/21 1745 02/13/21 2300 02/14/21 0021 02/14/21 0325  NA 132* 134* 132* 135 136 135  K 3.0* 3.0* 2.4* 3.6 3.8 3.8  CL 99 100  --  102  --  103  CO2 13*  --   --  23  --  23  GLUCOSE 242* 241*  --  91  --  130*  BUN 6 4*  --  6  --  7  CREATININE 0.76 0.70  --  0.63  --  0.61  CALCIUM 7.6*  --   --  8.2*  --  7.9*  MG 1.7  --   --  2.1  --  1.9  PHOS  --   --   --  3.0  --  3.4    GFR: Estimated Creatinine Clearance: 74.7 mL/min (by C-G formula based on SCr of 0.61 mg/dL). Recent Labs  Lab 02/13/21 1651 02/13/21 2311 02/14/21 0325 02/14/21 0629  WBC 8.1  --  8.7  --   LATICACIDVEN  --  3.8*  --  2.2*     Liver Function Tests:  Recent Labs  Lab 02/13/21 1651  AST 307*  ALT 58*  ALKPHOS 92  BILITOT 0.8  PROT 6.3*  ALBUMIN 3.0*    Recent Labs  Lab 02/13/21 2031  LIPASE 20   Recent Labs  Lab 02/13/21 1758  AMMONIA 59*    ABG    Component Value Date/Time   PHART 7.434 02/14/2021 0021   PCO2ART 35.0 02/14/2021 0021   PO2ART 177 (H) 02/14/2021 0021   HCO3 23.5 02/14/2021 0021   TCO2 25 02/14/2021 0021   ACIDBASEDEF 14.0 (H) 02/13/2021 1745   O2SAT 100.0 02/14/2021 0021      Coagulation Profile: No results for input(s): INR, PROTIME in the last 168 hours.  Cardiac Enzymes: No results for input(s): CKTOTAL, CKMB, CKMBINDEX, TROPONINI in the last 168 hours.  HbA1C: Hgb A1c MFr Bld  Date/Time Value Ref Range Status  06/16/2019 10:05 AM 5.3 4.8 - 5.6 % Final    Comment:    (NOTE) Pre diabetes:          5.7%-6.4% Diabetes:              >6.4% Glycemic control for   <7.0% adults with diabetes   06/04/2016 06:38 PM 5.1 4.8 - 5.6 % Final    Comment:    (NOTE)         Pre-diabetes: 5.7 - 6.4         Diabetes: >6.4         Glycemic control for adults with diabetes: <7.0     CBG: Recent Labs  Lab 02/13/21 2025 02/14/21 0008  02/14/21 0307 02/14/21 0728  GLUCAP 138* 93 128* 139*    Review of Systems:   Patient is encephalopathic and/or intubated. Therefore history has been obtained from chart review.   Past Medical History:  She,  has a past medical history of AKI (acute kidney injury) ([place]) (06/15/2019), Alcohol abuse, Alcoholic 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   Surgical History:   Past Surgical History:  Procedure Laterality Date   CERVICAL CERCLAGE Bilateral 01/18/2019   Procedure: CERCLAGE CERVICAL;  Surgeon: Everett Graff, MD;  Location: [facility] LD ORS;  Service: Gynecology;  Laterality: Bilateral;   DILATATION AND CURETTAGE/HYSTEROSCOPY WITH MINERVA N/A 08/23/2019   Procedure: DILATATION AND CURETTAGE/HYSTEROSCOPY WITH MINERVA;  Surgeon: Waymon Amato, MD;  Location: [facility];  Service: Gynecology;  Laterality: N/A;  rep will be here for this case confirmed on 08/18/19.   ESOPHAGOGASTRODUODENOSCOPY (EGD) WITH PROPOFOL N/A 06/05/2016   Procedure: ESOPHAGOGASTRODUODENOSCOPY (EGD) WITH PROPOFOL;  Surgeon: Otis Brace, MD;  Location: [facility] ENDOSCOPY;  Service: Gastroenterology;  Laterality: N/A;   PLACEMENT OF BREAST IMPLANTS       Social History:   reports that she quit smoking about 7 years ago. She has a 7.50 pack-year smoking history. She has never used smokeless tobacco. She reports current alcohol use. She reports that she does not use drugs.   Family History:  Her family history includes Diabetes in her maternal grandmother; Stroke in her mother.   Allergies Allergies  Allergen Reactions   Ibuprofen Anaphylaxis   Chlorhexidine Other (See Comments)   Penicillins Nausea And Vomiting   Effexor [Venlafaxine] Palpitations and Other (See Comments)    Panic attacks   Latex Rash    Tape  irritates the skin/ please use paper tape   Tape Rash    pls use paper tape  Home Medications  Prior to Admission medications   Medication Sig Start Date End Date Taking? Authorizing Provider  Multiple Vitamin (MULTIVITAMIN WITH MINERALS) TABS tablet Take 1 tablet by mouth daily. 02/19/20   Aline August, MD  ondansetron (ZOFRAN) 4 MG tablet Take 1 tablet (4 mg total) by mouth every 6 (six) hours as needed for nausea or vomiting. 02/19/20   Aline August, MD  pantoprazole (PROTONIX) 40 MG tablet Take 1 tablet (40 mg total) by mouth daily. 02/19/20 03/20/20  Aline August, MD  thiamine 100 MG tablet Take 1 tablet (100 mg total) by mouth daily. 02/19/20   Aline August, MD  traMADol (ULTRAM) 50 MG tablet Take 1 tablet (50 mg total) by mouth every 6 (six) hours as needed for severe pain. 02/19/20   Aline August, MD     Critical care time: 43 Minutes     Menashe Kafer Chancy Milroy., MSN, APRN, AGACNP-BC  Pulmonary & Critical Care  02/14/2021 , 8:46 AM  Please see [email or web address] for pager details  If no response, please call [phone] After hours, please call Elink at [phone]

## 2021-02-14 NOTE — Progress Notes (Signed)
PCCM interval progress note:  S: Called to bedside for Vtach with a pulse with patient moving but not responding. See code documentation for full details. Started at 1116, pads were connected, defibrillation was done immediately, CPR started by staff but stopped upon patient moving. 137m amio given. NSR on monitor.  O: Patient back to post extubation neuro exam F/C, some confusion, GCS 14. Episode of emesis.  BP 109/81   Pulse 84   Temp 99.1 F (37.3 C)   Resp 19   Ht 5' 2"  (1.575 m)   Wt 58.7 kg   SpO2 97%   BMI 23.67 kg/m   On 172mlevophed, 2 LNC, air movement in all lobes   A: P:  Vtach/Vfib arrest ?Torsades based on tracing. Boyfriend present for entire code. States that this was the same presentation prior to previous arrest. ?COVID myocarditis.12 lead with no obvious ST changes. QTC 546. -CXR, CMP, MG, Phos,  -amio gtt,  -2gms mg, 1066mKCL ordered.  -Cards consulted by Dr. McQLake Bellsppreciate assistance with workup.    TimRedmond SchoolMSN, APRN, AGACNP-BC [facility]  02/14/2021 , 11:44 AM  Please see [email or web address] for pager details  If no response, please call [phone] After hours, please call Elink at [phone]

## 2021-02-14 NOTE — Progress Notes (Signed)
LTM EEG discontinued - no skin breakdown at unhook.   

## 2021-02-15 ENCOUNTER — Encounter (HOSPITAL_COMMUNITY)
Admission: EM | Disposition: A | Discharge status: Home / Self Care | Payer: Self-pay | Source: Home / Self Care | Attending: Internal Medicine

## 2021-02-15 DIAGNOSIS — I469 Cardiac arrest, cause unspecified: Secondary | ICD-10-CM | POA: Diagnosis not present

## 2021-02-15 DIAGNOSIS — U071 COVID-19: Secondary | ICD-10-CM

## 2021-02-15 DIAGNOSIS — I5021 Acute systolic (congestive) heart failure: Secondary | ICD-10-CM | POA: Diagnosis not present

## 2021-02-15 DIAGNOSIS — R570 Cardiogenic shock: Secondary | ICD-10-CM

## 2021-02-15 DIAGNOSIS — I4901 Ventricular fibrillation: Principal | ICD-10-CM

## 2021-02-15 DIAGNOSIS — E871 Hypo-osmolality and hyponatremia: Secondary | ICD-10-CM

## 2021-02-15 HISTORY — PX: RIGHT/LEFT HEART CATH AND CORONARY ANGIOGRAPHY: CATH118266

## 2021-02-15 LAB — POCT I-STAT EG7
Acid-Base Excess: 1 mmol/L (ref 0.0–2.0)
Acid-base deficit: 1 mmol/L (ref 0.0–2.0)
Bicarbonate: 23.6 mmol/L (ref 20.0–28.0)
Bicarbonate: 25.5 mmol/L (ref 20.0–28.0)
Calcium, Ion: 0.92 mmol/L — ABNORMAL LOW (ref 1.15–1.40)
Calcium, Ion: 1.09 mmol/L — ABNORMAL LOW (ref 1.15–1.40)
HCT: 28 % — ABNORMAL LOW (ref 36.0–46.0)
HCT: 32 % — ABNORMAL LOW (ref 36.0–46.0)
Hemoglobin: 10.9 g/dL — ABNORMAL LOW (ref 12.0–15.0)
Hemoglobin: 9.5 g/dL — ABNORMAL LOW (ref 12.0–15.0)
O2 Saturation: 58 %
O2 Saturation: 60 %
Potassium: 3.7 mmol/L (ref 3.5–5.1)
Potassium: 4.1 mmol/L (ref 3.5–5.1)
Sodium: 135 mmol/L (ref 135–145)
Sodium: 139 mmol/L (ref 135–145)
TCO2: 25 mmol/L (ref 22–32)
TCO2: 27 mmol/L (ref 22–32)
pCO2, Ven: 36.6 mmHg — ABNORMAL LOW (ref 44.0–60.0)
pCO2, Ven: 39.4 mmHg — ABNORMAL LOW (ref 44.0–60.0)
pH, Ven: 7.418 (ref 7.250–7.430)
pH, Ven: 7.42 (ref 7.250–7.430)
pO2, Ven: 30 mmHg — CL (ref 32.0–45.0)
pO2, Ven: 30 mmHg — CL (ref 32.0–45.0)

## 2021-02-15 LAB — BASIC METABOLIC PANEL
Anion gap: 10 (ref 5–15)
BUN: <5 mg/dL — ABNORMAL LOW (ref 6–20)
CO2: 22 mmol/L (ref 22–32)
Calcium: 7.7 mg/dL — ABNORMAL LOW (ref 8.9–10.3)
Chloride: 100 mmol/L (ref 98–111)
Creatinine, Ser: 0.48 mg/dL (ref 0.44–1.00)
GFR, Estimated: >60 mL/min (ref >60)
Glucose, Bld: 181 mg/dL — ABNORMAL HIGH (ref 70–99)
Potassium: 4.3 mmol/L (ref 3.5–5.1)
Sodium: 132 mmol/L — ABNORMAL LOW (ref 135–145)

## 2021-02-15 LAB — MAGNESIUM
Magnesium: 1.9 mg/dL (ref 1.7–2.4)
Magnesium: 1.9 mg/dL (ref 1.7–2.4)

## 2021-02-15 LAB — TROPONIN I (HIGH SENSITIVITY): Troponin I (High Sensitivity): 45 ng/L — ABNORMAL HIGH (ref <18)

## 2021-02-15 LAB — HEPARIN LEVEL (UNFRACTIONATED)
Heparin Unfractionated: 0.17 IU/mL — ABNORMAL LOW (ref 0.30–0.70)
Heparin Unfractionated: 0.16 IU/mL — ABNORMAL LOW (ref 0.30–0.70)

## 2021-02-15 LAB — COMPREHENSIVE METABOLIC PANEL
ALT: 39 U/L (ref 0–44)
AST: 87 U/L — ABNORMAL HIGH (ref 15–41)
Albumin: 2.6 g/dL — ABNORMAL LOW (ref 3.5–5.0)
Alkaline Phosphatase: 71 U/L (ref 38–126)
Anion gap: 12 (ref 5–15)
BUN: <5 mg/dL — ABNORMAL LOW (ref 6–20)
CO2: 22 mmol/L (ref 22–32)
Calcium: 8 mg/dL — ABNORMAL LOW (ref 8.9–10.3)
Chloride: 96 mmol/L — ABNORMAL LOW (ref 98–111)
Creatinine, Ser: 0.44 mg/dL (ref 0.44–1.00)
GFR, Estimated: >60 mL/min (ref >60)
Glucose, Bld: 258 mg/dL — ABNORMAL HIGH (ref 70–99)
Potassium: 3.9 mmol/L (ref 3.5–5.1)
Sodium: 130 mmol/L — ABNORMAL LOW (ref 135–145)
Total Bilirubin: 0.8 mg/dL (ref 0.3–1.2)
Total Protein: 5.9 g/dL — ABNORMAL LOW (ref 6.5–8.1)

## 2021-02-15 LAB — POCT I-STAT 7, (LYTES, BLD GAS, ICA,H+H)
Acid-base deficit: 3 mmol/L — ABNORMAL HIGH (ref 0.0–2.0)
Bicarbonate: 21.1 mmol/L (ref 20.0–28.0)
Calcium, Ion: 0.87 mmol/L — CL (ref 1.15–1.40)
HCT: 30 % — ABNORMAL LOW (ref 36.0–46.0)
Hemoglobin: 10.2 g/dL — ABNORMAL LOW (ref 12.0–15.0)
O2 Saturation: 95 %
Potassium: 3.5 mmol/L (ref 3.5–5.1)
Sodium: 140 mmol/L (ref 135–145)
TCO2: 22 mmol/L (ref 22–32)
pCO2 arterial: 33.1 mmHg (ref 32.0–48.0)
pH, Arterial: 7.413 (ref 7.350–7.450)
pO2, Arterial: 72 mmHg — ABNORMAL LOW (ref 83.0–108.0)

## 2021-02-15 LAB — GLUCOSE, CAPILLARY
Glucose-Capillary: 136 mg/dL — ABNORMAL HIGH (ref 70–99)
Glucose-Capillary: 96 mg/dL (ref 70–99)
Glucose-Capillary: 113 mg/dL — ABNORMAL HIGH (ref 70–99)
Glucose-Capillary: 115 mg/dL — ABNORMAL HIGH (ref 70–99)
Glucose-Capillary: 139 mg/dL — ABNORMAL HIGH (ref 70–99)

## 2021-02-15 LAB — CBC
HCT: 28.8 % — ABNORMAL LOW (ref 36.0–46.0)
Hemoglobin: 9.5 g/dL — ABNORMAL LOW (ref 12.0–15.0)
MCH: 35.4 pg — ABNORMAL HIGH (ref 26.0–34.0)
MCHC: 33 g/dL (ref 30.0–36.0)
MCV: 107.5 fL — ABNORMAL HIGH (ref 80.0–100.0)
Platelets: 185 10*3/uL (ref 150–400)
RBC: 2.68 MIL/uL — ABNORMAL LOW (ref 3.87–5.11)
RDW: 12.7 % (ref 11.5–15.5)
WBC: 9.6 10*3/uL (ref 4.0–10.5)
nRBC: 0 % (ref 0.0–0.2)

## 2021-02-15 LAB — COOXEMETRY PANEL
Carboxyhemoglobin: 1.3 % (ref 0.5–1.5)
Methemoglobin: 0.8 % (ref 0.0–1.5)
O2 Saturation: 74.2 %
Total hemoglobin: 10.6 g/dL — ABNORMAL LOW (ref 12.0–16.0)

## 2021-02-15 LAB — POCT ACTIVATED CLOTTING TIME: Activated Clotting Time: 150 seconds

## 2021-02-15 LAB — PHOSPHORUS: Phosphorus: 4.2 mg/dL (ref 2.5–4.6)

## 2021-02-15 SURGERY — RIGHT/LEFT HEART CATH AND CORONARY ANGIOGRAPHY
Anesthesia: LOCAL

## 2021-02-15 MED ORDER — SODIUM CHLORIDE 0.9 % IV SOLN: INTRAVENOUS | Status: AC

## 2021-02-15 MED ORDER — IOHEXOL 350 MG/ML SOLN
INTRAVENOUS | Status: DC | PRN
  Administered 2021-02-15: 40 mL

## 2021-02-15 MED ORDER — SODIUM CHLORIDE 0.9 % IV SOLN: INTRAVENOUS | Status: DC

## 2021-02-15 MED ORDER — POTASSIUM CHLORIDE 10 MEQ/100ML IV SOLN
10.0000 meq | INTRAVENOUS | Status: DC
  Filled 2021-02-15 (×3): qty 100

## 2021-02-15 MED ORDER — ONDANSETRON HCL 4 MG/2ML IJ SOLN
4.0000 mg | Freq: Four times a day (QID) | INTRAMUSCULAR | Status: DC | PRN
  Administered 2021-02-16 (×2): 4 mg via INTRAVENOUS
  Filled 2021-02-15 (×2): qty 2

## 2021-02-15 MED ORDER — HEPARIN SODIUM (PORCINE) 1000 UNIT/ML IJ SOLN
INTRAMUSCULAR | Status: DC | PRN
  Administered 2021-02-15: 3000 [IU] via INTRAVENOUS

## 2021-02-15 MED ORDER — ASPIRIN 81 MG PO CHEW
81.0000 mg | CHEWABLE_TABLET | ORAL | Status: AC
  Administered 2021-02-15: 81 mg via ORAL
  Filled 2021-02-15 (×2): qty 1

## 2021-02-15 MED ORDER — FENTANYL CITRATE (PF) 100 MCG/2ML IJ SOLN
INTRAMUSCULAR | Status: AC
  Filled 2021-02-15: qty 2

## 2021-02-15 MED ORDER — LIDOCAINE HCL (PF) 1 % IJ SOLN
INTRAMUSCULAR | Status: DC | PRN
  Administered 2021-02-15 (×2): 2 mL

## 2021-02-15 MED ORDER — SODIUM CHLORIDE 0.9% FLUSH
3.0000 mL | Freq: Two times a day (BID) | INTRAVENOUS | Status: DC
  Administered 2021-02-15: 3 mL via INTRAVENOUS

## 2021-02-15 MED ORDER — LIDOCAINE HCL (PF) 1 % IJ SOLN
INTRAMUSCULAR | Status: AC
  Filled 2021-02-15: qty 30

## 2021-02-15 MED ORDER — HEPARIN SODIUM (PORCINE) 1000 UNIT/ML IJ SOLN
INTRAMUSCULAR | Status: AC
  Filled 2021-02-15: qty 1

## 2021-02-15 MED ORDER — SODIUM CHLORIDE 0.9% FLUSH
3.0000 mL | Freq: Two times a day (BID) | INTRAVENOUS | Status: DC
  Administered 2021-02-17 – 2021-02-20 (×7): 3 mL via INTRAVENOUS

## 2021-02-15 MED ORDER — MIDAZOLAM HCL 2 MG/2ML IJ SOLN
INTRAMUSCULAR | Status: AC
  Filled 2021-02-15: qty 2

## 2021-02-15 MED ORDER — LABETALOL HCL 5 MG/ML IV SOLN: 10.0000 mg | INTRAVENOUS | Status: AC | PRN

## 2021-02-15 MED ORDER — VERAPAMIL HCL 2.5 MG/ML IV SOLN
INTRAVENOUS | Status: DC | PRN
  Administered 2021-02-15: 10 mL via INTRA_ARTERIAL

## 2021-02-15 MED ORDER — HEPARIN (PORCINE) IN NACL 1000-0.9 UT/500ML-% IV SOLN
INTRAVENOUS | Status: DC | PRN
  Administered 2021-02-15 (×3): 500 mL

## 2021-02-15 MED ORDER — HEPARIN (PORCINE) IN NACL 1000-0.9 UT/500ML-% IV SOLN
INTRAVENOUS | Status: AC
  Filled 2021-02-15: qty 1000

## 2021-02-15 MED ORDER — INSULIN ASPART 100 UNIT/ML IJ SOLN: 0.0000 [IU] | Freq: Every day | INTRAMUSCULAR | Status: DC

## 2021-02-15 MED ORDER — MAGNESIUM SULFATE 2 GM/50ML IV SOLN
2.0000 g | Freq: Once | INTRAVENOUS | Status: AC
  Administered 2021-02-15: 2 g via INTRAVENOUS
  Filled 2021-02-15 (×2): qty 50

## 2021-02-15 MED ORDER — FENTANYL CITRATE (PF) 100 MCG/2ML IJ SOLN
INTRAMUSCULAR | Status: DC | PRN
  Administered 2021-02-15: 25 ug via INTRAVENOUS

## 2021-02-15 MED ORDER — HEPARIN BOLUS VIA INFUSION
2000.0000 [IU] | Freq: Once | INTRAVENOUS | Status: AC
  Administered 2021-02-15: 2000 [IU] via INTRAVENOUS
  Filled 2021-02-15: qty 2000

## 2021-02-15 MED ORDER — SODIUM CHLORIDE 0.9% FLUSH
3.0000 mL | INTRAVENOUS | Status: DC | PRN
  Administered 2021-02-17 (×2): 3 mL via INTRAVENOUS

## 2021-02-15 MED ORDER — INSULIN ASPART 100 UNIT/ML IJ SOLN
0.0000 [IU] | Freq: Three times a day (TID) | INTRAMUSCULAR | Status: DC
Start: 2021-02-15 — End: 2021-02-20
  Administered 2021-02-16 – 2021-02-18 (×4): 1 [IU] via SUBCUTANEOUS
  Administered 2021-02-19 – 2021-02-20 (×2): 2 [IU] via SUBCUTANEOUS

## 2021-02-15 MED ORDER — POTASSIUM CHLORIDE 10 MEQ/50ML IV SOLN
10.0000 meq | INTRAVENOUS | Status: AC
Start: 2021-02-15 — End: 2021-02-15
  Administered 2021-02-15 (×2): 10 meq via INTRAVENOUS
  Filled 2021-02-15 (×5): qty 50

## 2021-02-15 MED ORDER — MENTHOL 3 MG MT LOZG
1.0000 | LOZENGE | OROMUCOSAL | Status: DC | PRN
  Administered 2021-02-15 (×2): 3 mg via ORAL
  Filled 2021-02-15 (×3): qty 9

## 2021-02-15 MED ORDER — MIDAZOLAM HCL 2 MG/2ML IJ SOLN
INTRAMUSCULAR | Status: DC | PRN
  Administered 2021-02-15: 1 mg via INTRAVENOUS

## 2021-02-15 MED ORDER — HYDRALAZINE HCL 20 MG/ML IJ SOLN: 10.0000 mg | INTRAMUSCULAR | Status: AC | PRN

## 2021-02-15 MED ORDER — ACETAMINOPHEN 325 MG PO TABS
650.0000 mg | ORAL_TABLET | ORAL | Status: DC | PRN
  Administered 2021-02-17 – 2021-02-18 (×4): 650 mg via ORAL
  Filled 2021-02-15 (×4): qty 2

## 2021-02-15 MED ORDER — SODIUM CHLORIDE 0.9 % IV BOLUS
250.0000 mL | Freq: Once | INTRAVENOUS | Status: AC
  Administered 2021-02-15: 250 mL via INTRAVENOUS

## 2021-02-15 MED ORDER — VERAPAMIL HCL 2.5 MG/ML IV SOLN
INTRAVENOUS | Status: AC
  Filled 2021-02-15: qty 2

## 2021-02-15 MED ORDER — HEPARIN (PORCINE) 25000 UT/250ML-% IV SOLN
1900.0000 [IU]/h | INTRAVENOUS | Status: DC
  Administered 2021-02-15 – 2021-02-16 (×2): 1150 [IU]/h via INTRAVENOUS
  Administered 2021-02-16: 1550 [IU]/h via INTRAVENOUS
  Administered 2021-02-17: 1800 [IU]/h via INTRAVENOUS
  Administered 2021-02-18: 1900 [IU]/h via INTRAVENOUS
  Filled 2021-02-15 (×4): qty 250

## 2021-02-15 MED ORDER — SODIUM CHLORIDE 0.9 % IV SOLN
250.0000 mL | INTRAVENOUS | Status: DC | PRN
  Administered 2021-02-16 (×3): 250 mL via INTRAVENOUS

## 2021-02-15 MED ORDER — SODIUM CHLORIDE 0.9 % IV SOLN: 250.0000 mL | INTRAVENOUS | Status: DC | PRN

## 2021-02-15 MED ORDER — GUAIFENESIN-DM 100-10 MG/5ML PO SYRP
5.0000 mL | ORAL_SOLUTION | ORAL | Status: DC | PRN
  Administered 2021-02-15 – 2021-02-19 (×5): 5 mL via ORAL
  Filled 2021-02-15 (×2): qty 5
  Filled 2021-02-15 (×2): qty 10
  Filled 2021-02-15: qty 5

## 2021-02-15 MED ORDER — SODIUM CHLORIDE 0.9% FLUSH: 3.0000 mL | INTRAVENOUS | Status: DC | PRN

## 2021-02-15 SURGICAL SUPPLY — 10 items
CATH 5FR JL3.5 JR4 ANG PIG MP (CATHETERS) ×1 IMPLANT
CATH BALLN WEDGE 5F 110CM (CATHETERS) ×1 IMPLANT
DEVICE RAD TR BAND REGULAR (VASCULAR PRODUCTS) ×1 IMPLANT
GLIDESHEATH SLEND SS 6F .021 (SHEATH) ×1 IMPLANT
GUIDEWIRE INQWIRE 1.5J.035X260 (WIRE) IMPLANT
INQWIRE 1.5J .035X260CM (WIRE) ×2
KIT MICROPUNCTURE NIT STIFF (SHEATH) ×1 IMPLANT
PACK CARDIAC CATHETERIZATION (CUSTOM PROCEDURE TRAY) ×2 IMPLANT
SHEATH GLIDE SLENDER 4/5FR (SHEATH) ×1 IMPLANT
TRANSDUCER W/STOPCOCK (MISCELLANEOUS) ×2 IMPLANT

## 2021-02-15 NOTE — Progress Notes (Signed)
[facility] Progress Note Patient Name: Susan [name] DOB: Aug 13, 1980 MRN: [id number]   Date of Service  02/15/2021  HPI/Events of Note  Patient c/o cough not responsive to Cepacol.  eICU Interventions  Robitussin DM PRN cough ordered, as well as an incentive spirometer.        [name] Shallyn Constancio 02/15/2021, 11:28 PM

## 2021-02-15 NOTE — Progress Notes (Signed)
[place] for IV heparin Indication: apical mural thrombus, ?ACS  Allergies  Allergen Reactions   Ibuprofen Anaphylaxis   Ondansetron Hcl Other (See Comments)    Led to QT prolongation and cardiac arrest   Chlorhexidine Other (See Comments)   Penicillins Nausea And Vomiting   Effexor [Venlafaxine] Palpitations and Other (See Comments)    Panic attacks   Latex Rash    Tape irritates the skin/ please use paper tape   Tape Rash    pls use paper tape    Patient Measurements: Height: 5' 2"  (157.5 cm) Weight: 57.9 kg (127 lb 10.3 oz) IBW/kg (Calculated) : 50.1 Heparin Dosing Weight: 58.7 kg  Vital Signs: Temp: 99.3 F (37.4 C) (09/08 1345) Temp Source: Bladder (09/08 1113) BP: 108/80 (09/08 1345) Pulse Rate: 81 (09/08 1345)  Labs: Recent Labs    02/14/21 0325 02/14/21 1203 02/14/21 1340 02/14/21 1841 02/14/21 2307 02/15/21 0433 02/15/21 1254  HGB 12.2 10.3*  --   --   --  9.5*  --   HCT 35.2* 32.0*  --   --   --  28.8*  --   PLT 230 222  --   --   --  185  --   HEPARINUNFRC  --   --   --   --   --  0.17* 0.16*  CREATININE 0.61 0.63 0.71  --  0.48 0.44  --   TROPONINIHS  --   --   --  50* 45*  --   --      Estimated Creatinine Clearance: 74.7 mL/min (by C-G formula based on SCr of 0.44 mg/dL).   Medical History: Past Medical History:  Diagnosis Date   AKI (acute kidney injury) ([place]) 06/15/2019   admitted with delhydration r/o arf   Alcohol abuse    Alcoholic hepatitis without ascites    Depression    Family history of adverse reaction to anesthesia    MOTHER HAD PANIC ATTACKS & NAUSEA  AFTER   Gastritis    GIB (gastrointestinal bleeding)    Hypertension    Iron deficiency anemia    Pancreatitis    PONV (postoperative nausea and vomiting)     Medications:  Infusions:   sodium chloride     sodium chloride 10 mL/hr at 02/15/21 0939   amiodarone 30 mg/hr (02/15/21 0600)   cefTRIAXone (ROCEPHIN)  IV Stopped  (02/14/21 1910)   heparin 950 Units/hr (02/15/21 0600)   levETIRAcetam 1,000 mg (02/15/21 1112)   norepinephrine (LEVOPHED) Adult infusion 2 mcg/min (02/15/21 0600)    Assessment: 40 yo female admitted after cardiac arrest.  Pharmacy asked to start IV heparin for apical mural thrombus seen on ECHO.  Not taking anticoagulants PTA.  Heparin level below goal 0.16 on 950 units/hr. No bleeding issues noted. Hemoglobin trending down from 12>10>9.5. Plt count down to 185.   Patient to go to cath lab later today therefore will not bolus.   Goal of Therapy:  Heparin level 0.3-0.7 units/ml Monitor platelets by anticoagulation protocol: Yes   Plan:  Increase heparin gtt to 1150 units/hr. Check heparin level post-cath if indicated to continue Daily heparin level and CBC.  Cathrine Muster, PharmD PGY2 Cardiology Pharmacy Resident Phone: [phone] 02/15/2021  2:13 PM Please check [email or web address] for unit-specific pharmacy phone numbers.

## 2021-02-15 NOTE — Interval H&P Note (Signed)
History and Physical Interval Note:  02/15/2021 4:17 PM  Susan [name]  has presented today for surgery, with the diagnosis of HF.  The various methods of treatment have been discussed with the patient and family. After consideration of risks, benefits and other options for treatment, the patient has consented to  Procedure(s): RIGHT/LEFT HEART CATH AND CORONARY ANGIOGRAPHY (N/A) and possible coronary angioplasty as a surgical intervention.  The patient's history has been reviewed, patient examined, no change in status, stable for surgery.  I have reviewed the patient's chart and labs.  Questions were answered to the patient's satisfaction.     Tuvia Woodrick

## 2021-02-15 NOTE — Progress Notes (Addendum)
NAME:  Susan [name], MRN:  [id number], DOB:  15-Jul-1980, LOS: 2 ADMISSION DATE:  02/13/2021, CONSULTATION DATE: 9/6 REFERRING MD: Dr. 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  She has recent admissions to the [facility] health system for alcoholic ketoacidosis, pancreatitis.  She also has recent office visits for major depressive disorder.  She was at home in her usual state of health until 9/6 when she had witnessed seizure-like activity.  This was witnessed by her fianc who called EMS and started CPR.  Upon EMS arrival the patient was reportedly awake, although it is unclear what her true level of consciousness was.  She did again had seizure-like activity which progressed to cardiac arrest.  Initial rhythm was ventricular fibrillation and she was defibrillated.  She then underwent 10 additional minutes of ACLS with PEA at each additional rhythm check until ROSC.  She was transported to [facility] emergency department she was intubated upon arrival.  No purposeful movements were identified in the emergency department. PCCM asked to admit.  She remains critically ill.  Pertinent  Medical History    has a past medical history of AKI (acute kidney injury) ([place]) (06/15/2019), Alcohol abuse, Alcoholic 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   Significant Hospital Events: Including procedures, antibiotic start and stop dates in addition to other pertinent events   9/6 admit for sz, cardiac arrest. Ceftriaxone> Flagyl 9/6>9/7 9/7 TTM stopped due to GCS 11T, extubated. V fib arrest, 2 min cpr. QT prolonged. Cards consulted EF 30-35%  Interim History / Subjective:   Extubated, V fib arrest,   Tmax 99.1  +1.1 L admit  On 2lnc  Levo 2, amio 10m hr, heparin   Subjective: endorses some chest tightness and chest pain, does not feel it is sharp or dull. Endorses some nausea.  Objective   Blood pressure 107/82, pulse 75, temperature 99.1 F (37.3 C), resp. rate 20, height 5' 2"  (1.575 m), weight 57.9 kg, SpO2 100 %, not currently breastfeeding. CVP:  [7 mmHg-22 mmHg] 22 mmHg  FiO2 (%):  [40 %] 40 %   Intake/Output Summary (Last 24 hours) at 02/15/2021 0717 Last data filed at 02/15/2021 0600 Gross per 24 hour  Intake 1902.66 ml  Output 1351 ml  Net 551.66 ml   Filed Weights   02/14/21 0500 02/15/21 0006 02/15/21 0500  Weight: 58.7 kg 57.9 kg 57.9 kg    Examination: General:  in bed, on cell phone, NAD HEENT: MM pink/moist, anicteric, atraumatic Neuro: GCS 15, RASS 0, PERRL 375m does not remember cardiac events of yesterday CV: S1S2, NSR, no m/r/g appreciated PULM:  Clear in the upper lobes and in the lower lobes, Trachea midline, chest expansion symmetric GI: soft, bsx4 active, nontender   Extremities: warm/dry, no pretibial edema, capillary refill less than 3 seconds  Skin: no rashes or lesions   Resolved Hospital Problem list     Assessment & Plan:   Cardiac arrest- x2 Cardiogenic Shock Acute systolic heart failure Prolonged QT Initial rhythm VF.  etiology not certain, electrolyte abnormalities are certainly a concern. CTA head and CTA chest without obvious cause. Polymorphic CT/Torsades arrest again on 9/7 after extubation and 1 hr after zofran administration. 57m657mcpr with 1 shock. Prolonged QT on ekg. Trop  50>45. Coox 58.4>74.2. Endorses some chest pain, not sharp or dull, no radiation. No obvious ischemic changes seen on 12 lead. QTC 583. Suspect secondary to chest compressions x2. On 2 of NE, down from 10. ECHO with EF 30-35% with regional WMA. ? Of laminated apical septal thrombus on echo per cards. -Cardiology consulted.  Appreciate assistance. Plan for LHC today and cardiac MRI when able. -Goal K above 4, goal MG above 2 -Avoid QT prolonging agents -Continue Amio GTT -EP assessment -PRN EKG -Continue heparin gtt per pharmacy -GDMT per cards  COVID 19  Acute hypoxemic respiratory failure-Improving Aspiration PNA Secondary to arrest. No leukocytosis. Extubated on 9/7. On 2LNC. CXR no pneumo, effusion, or infiltrate. Improving from a pulmonary standpoint. Tmax 99.1, no leukocytosis. -Continue pulmonary toilet -Continue Ceftriaxone. Will discuss stop states with pharmacy -Continue to monitor BC/TA. NG at this time.  Lactic acidosis-resolved Secondary to arrest. Lactate 3.8>2.2>1.7 -Continue supportive care -Goal map greater than 65  DM: following pregnancy 13 months prior. Family does not feels she has not been treating this well.  BG 91-241 -Blood Glucose goal 140-180. -SSI  Seizure: suspect secondary to arhythmia  -Management and workup per neurology -AED per neurology -MRI brain when able  Transaminitis-improving ?shock liver vs alcoholic hepatitis. AST 170>87, ALT 52>39 -PRN LFTs -Supportive care  Diarrhea Vomiting ?cardiac vs ETOH withdrawal. Pt received lactulose on 9/7 -Monitor today -Hepatitis panel -Supportive care -Will consider further workup if continues  Hypokalemia-Improved Hypomagnesia Hyponatremia K 3.9, MG 1.9, NA 135>132>130. ? Secondary to vomiting and nausea? -Goal K above 4, goal MG above 2 -Replete -Follow up on AM labs -No free H20 -Check serum osm  Alcohol abuse history, but recently has been drinking a glass of wine -4 days per week per husband. ETOH 104 on admission -Continue thiamine, folate, MVI -Start CIWA today  Best Practice (right click and "Reselect all SmartList Selections" daily)   Diet/type: NPO w/ oral meds DVT prophylaxis: systemic heparin GI prophylaxis: PPI Lines: Central line Foley:  Yes, and it is still needed Code Status:  full  code Last date of multidisciplinary goals of care discussion [9/6]   Critical care time: [place] Bronislaw Switzer, Jr., MSN, APRN, AGACNP-BC  Pulmonary & Critical Care  02/15/2021 , 7:17 AM  Please see [email or web address] for pager details  If no response, please call [phone] After hours, please call Elink at [phone]

## 2021-02-15 NOTE — Progress Notes (Signed)
Patient complains of persistent cough and chest pain from prior compressions. Writer contacted E-link at this time to add different cough suppressant. Awaiting orders. See MAR.

## 2021-02-15 NOTE — Progress Notes (Signed)
[place] for IV heparin Indication: apical mural thrombus  Allergies  Allergen Reactions   Ibuprofen Anaphylaxis   Ondansetron Hcl Other (See Comments)    Led to QT prolongation and cardiac arrest   Chlorhexidine Other (See Comments)   Penicillins Nausea And Vomiting   Effexor [Venlafaxine] Palpitations and Other (See Comments)    Panic attacks   Latex Rash    Tape irritates the skin/ please use paper tape   Tape Rash    pls use paper tape    Patient Measurements: Height: 5' 2"  (157.5 cm) Weight: 57.9 kg (127 lb 10.3 oz) IBW/kg (Calculated) : 50.1 Heparin Dosing Weight: 58.7 kg  Vital Signs: Temp: 99 F (37.2 C) (09/08 0500) Temp Source: Oral (09/07 2000) BP: 113/85 (09/08 0500) Pulse Rate: 76 (09/08 0500)  Labs: Recent Labs    02/14/21 0325 02/14/21 1203 02/14/21 1340 02/14/21 1841 02/14/21 2307 02/15/21 0433  HGB 12.2 10.3*  --   --   --  9.5*  HCT 35.2* 32.0*  --   --   --  28.8*  PLT 230 222  --   --   --  185  HEPARINUNFRC  --   --   --   --   --  0.17*  CREATININE 0.61 0.63 0.71  --  0.48  --   TROPONINIHS  --   --   --  50* 45*  --      Estimated Creatinine Clearance: 74.7 mL/min (by C-G formula based on SCr of 0.48 mg/dL).   Medical History: Past Medical History:  Diagnosis Date   AKI (acute kidney injury) ([place]) 06/15/2019   admitted with delhydration r/o arf   Alcohol abuse    Alcoholic hepatitis without ascites    Depression    Family history of adverse reaction to anesthesia    MOTHER HAD PANIC ATTACKS & NAUSEA  AFTER   Gastritis    GIB (gastrointestinal bleeding)    Hypertension    Iron deficiency anemia    Pancreatitis    PONV (postoperative nausea and vomiting)     Medications:  Infusions:   sodium chloride     [START ON 02/16/2021] sodium chloride     amiodarone 30 mg/hr (02/15/21 0500)   cefTRIAXone (ROCEPHIN)  IV Stopped (02/14/21 1910)   heparin 800 Units/hr (02/15/21 0500)    levETIRAcetam Stopped (02/14/21 2358)   norepinephrine (LEVOPHED) Adult infusion 3 mcg/min (02/15/21 0500)    Assessment: 40 yo female admitted after cardiac arrest.  Pharmacy asked to start IV heparin for apical mural thrombus seen on ECHO.  Not taking anticoagulants PTA.  Initial heparin level below goal 0.17 on 800 units/hr. No bleeding issues noted. Hemoglobin trending down from 12>10>9.5. Plt count down to 185.   Goal of Therapy:  Heparin level 0.3-0.7 units/ml Monitor platelets by anticoagulation protocol: Yes   Plan:  IV Heparin with bolus of 2000 units x 1. Increase heparin gtt to 950 units/hr. Check heparin level in 6 hrs. Daily heparin level and CBC.  Erin Hearing PharmD., BCPS Clinical Pharmacist 02/15/2021 5:33 AM

## 2021-02-15 NOTE — Progress Notes (Addendum)
Advanced Heart Failure Rounding Note  PCP-Cardiologist: Dr. Haroldine Laws    Patient Profile   40 y/o woman with h/o ETOH abuse, chronic pancreatitis, depression. FHx of heart disease.    Admitted with Polymorphic VT/VF arrest in the setting of COVID 19 infection, acute ETOH intoxication and electrolyte abnormalities with QT prolongation. Intubated and shocked in field. Extubated here and had recurrent arrest.    Echo EF 30-35% with regional WMAs (septum, inferolateral wall and apex)  Hs trop 50   ECG without significant ischemic changes. QTc 583  Subjective:    No further VT/VF. On amio gtt at 30/hr. K 3.9. Mg 1.9. EKG today shows QT/QTcB 592/652 ms  Hs trop 50>>45 ESR normal, 8   On NE 2. Co-ox 74%. Lactic acid 3.8>>2.2>>1.7    Objective:   Weight Range: 57.9 kg Body mass index is 23.35 kg/m.   Vital Signs:   Temp:  [98 F (36.7 C)-100 F (37.8 C)] 99.1 F (37.3 C) (09/08 0645) Pulse Rate:  [62-119] 75 (09/08 0645) Resp:  [14-25] 20 (09/08 0645) BP: (86-132)/(60-102) 107/82 (09/08 0645) SpO2:  [90 %-100 %] 100 % (09/08 0645) FiO2 (%):  [40 %] 40 % (09/07 0800) Weight:  [57.9 kg] 57.9 kg (09/08 0500) Last BM Date: 02/08/21  Weight change: Filed Weights   02/14/21 0500 02/15/21 0006 02/15/21 0500  Weight: 58.7 kg 57.9 kg 57.9 kg    Intake/Output:   Intake/Output Summary (Last 24 hours) at 02/15/2021 0753 Last data filed at 02/15/2021 0600 Gross per 24 hour  Intake 1902.66 ml  Output 1351 ml  Net 551.66 ml      Physical Exam    General:  fatigued  appearing. No resp difficulty HEENT: Normal Neck: Supple. JVP elevated . Carotids 2+ bilat; no bruits. No lymphadenopathy or thyromegaly appreciated. Cor: PMI nondisplaced. Regular rate & rhythm. No rubs, gallops or murmurs. Lungs: Clear Abdomen: Soft, nontender, nondistended. No hepatosplenomegaly. No bruits or masses. Good bowel sounds. Extremities: No cyanosis, clubbing, rash, edema Neuro: Alert &  orientedx3, cranial nerves grossly intact. moves all 4 extremities w/o difficulty. Affect pleasant   Telemetry   NSR 80s. No further VT/VF  EKG    NSR 73 bpm, QT/QTcB 592/652 ms  Labs    CBC Recent Labs    02/14/21 1203 02/15/21 0433  WBC 8.8 9.6  NEUTROABS 7.9*  --   HGB 10.3* 9.5*  HCT 32.0* 28.8*  MCV 107.7* 107.5*  PLT 222 625   Basic Metabolic Panel Recent Labs    02/14/21 1340 02/14/21 2307 02/15/21 0433  NA 135 132* 130*  K 3.6 4.3 3.9  CL 105 100 96*  CO2 20* 22 22  GLUCOSE 138* 181* 258*  BUN 6 <5* <5*  CREATININE 0.71 0.48 0.44  CALCIUM 7.9* 7.7* 8.0*  MG 3.1* 1.9 1.9  PHOS 2.4* 4.2  --    Liver Function Tests Recent Labs    02/14/21 1340 02/15/21 0433  AST 170* 87*  ALT 52* 39  ALKPHOS 82 71  BILITOT 1.0 0.8  PROT 6.3* 5.9*  ALBUMIN 2.9* 2.6*   Recent Labs    02/13/21 2031  LIPASE 20   Cardiac Enzymes No results for input(s): CKTOTAL, CKMB, CKMBINDEX, TROPONINI in the last 72 hours.  BNP: BNP (last 3 results) No results for input(s): BNP in the last 8760 hours.  ProBNP (last 3 results) No results for input(s): PROBNP in the last 8760 hours.   D-Dimer No results for input(s): DDIMER in the last  72 hours. Hemoglobin A1C No results for input(s): HGBA1C in the last 72 hours. Fasting Lipid Panel Recent Labs    02/14/21 0325  TRIG 222*   Thyroid Function Tests No results for input(s): TSH, T4TOTAL, T3FREE, THYROIDAB in the last 72 hours.  Invalid input(s): FREET3  Other results:   Imaging    DG Chest Port 1 View  Result Date: 02/14/2021 CLINICAL DATA:  Cardiac arrest, post CPR EXAM: PORTABLE CHEST 1 VIEW COMPARISON:  CT 02/13/2021 FINDINGS: Endotracheal and nasogastric tubes have been removed. Defibrillator pads overlie the left lower chest. There is a left neck angio catheter. Persistent right lung airspace disease, increased in comparison to prior exam. No large pleural effusion or visible pneumothorax. Bones are  unchanged. IMPRESSION: Endotracheal tube and nasogastric tubes have been removed. Persistent right lung airspace disease, increased from prior exam. Electronically Signed   By: Maurine Simmering M.D.   On: 02/14/2021 12:26   Overnight EEG with video  Result Date: 02/14/2021 Lora Havens, MD     02/14/2021 11:14 AM Patient Name: Susan [name] MRN: [id number] Epilepsy Attending: Lora Havens Referring Physician/Provider: Hayden Pedro, NP Duration: 02/13/2021 2013 to 02/14/2021 1027  Patient history: 40 y.o. female who presented to the ED for witnessed-seizure like activity at home witnessed by fiance then with EMS with VF arrest s/p defibrillation and 10 minutes of CPR with PEA until achieving ROSC. EEG to evaluate for seizure.  Level of alertness: awake, asleep  AEDs during EEG study: Keppra, propofol, Versed  Technical aspects: This EEG study was done with scalp electrodes positioned according to the 10-20 International system of electrode placement. Electrical activity was acquired at a sampling rate of 500Hz  and reviewed with a high frequency filter of 70Hz  and a low frequency filter of 1Hz . EEG data were recorded continuously and digitally stored.  Description: No clear posterior dominant rhythm was seen. Sleep was characterized by sleep spindles (12 to 14 Hz), maximal frontocentral region.  EEG showed continuous generalized predominantly 5 to 9 Hz theta and alpha activity as well as intermittent generalized 2 to 3 Hz delta slowing.  Hyperventilation and photic stimulation were not performed.    ABNORMALITY - Continuous slow, generalized  IMPRESSION: This study is suggestive of moderate diffuse encephalopathy, nonspecific etiology. No seizures or epileptiform discharges were seen throughout the recording.  Priyanka Barbra Sarks   ECHOCARDIOGRAM COMPLETE  Addendum Date: 02/14/2021   Report called to Cardiology fellow on call Dr. Conley Canal regarding possible apical septal thrombus by definity Fransico Him MD  Electronically Amended 02/14/2021, 9:09 PM   Final (Amended)    Result Date: 02/14/2021    ECHOCARDIOGRAM REPORT   Patient Name:   Susan [name] Date of Exam: 02/14/2021 Medical Rec #:  [id number]         Height:       62.0 in Accession #:    [id number]        Weight:       129.4 lb Date of Birth:  1980-11-24        BSA:          1.589 m Patient Age:    39 years          BP:           109/81 mmHg Patient Gender: F                 HR:           84 bpm. Exam Location:  Inpatient  Procedure: 2D Echo, Cardiac Doppler, Color Doppler and Intracardiac            Opacification Agent STAT ECHO Indications:    Cardiac arrest  History:        Patient has no prior history of Echocardiogram examinations.                 Arrythmias:Cardiac Arrest and Ventricular Fibrillation; Risk                 Factors:Diabetes. Covid+, alcohol abuse.  Sonographer:    Dustin Flock RDCS Referring Phys: [place]  1. Possible layered mural thrombus overlying the apical septal wall. Left ventricular ejection fraction, by estimation, is 25 to 30%. The left ventricle has severely decreased function. The left ventricle has no regional wall motion abnormalities. Left ventricular diastolic parameters are indeterminate. There is akinesis of the left ventricular, mid inferoseptal wall and anteroseptal wall. There is severe hypokinesis of the left ventricular, mid-apical lateral wall. There is severe hypokinesis of the left ventricular, basal-mid inferolateral wall. There is severe hypokinesis of the left ventricular, mid-apical anterior wall.  2. Right ventricular systolic function is normal. The right ventricular size is normal. There is normal pulmonary artery systolic pressure.  3. The mitral valve is normal in structure. Trivial mitral valve regurgitation. No evidence of mitral stenosis.  4. The aortic valve is normal in structure. Aortic valve regurgitation is not visualized. No aortic stenosis is present.  5. The  inferior vena cava is normal in size with greater than 50% respiratory variability, suggesting right atrial pressure of 3 mmHg. FINDINGS  Left Ventricle: Possible layered mural thrombus overlying the apical septal wall. Left ventricular ejection fraction, by estimation, is 25 to 30%. The left ventricle has severely decreased function. The left ventricle has no regional wall motion abnormalities. Severe hypokinesis of the left ventricular, mid-apical lateral wall. Severe hypokinesis of the left ventricular, basal-mid inferolateral wall. Severe hypokinesis of the left ventricular, mid-apical anterior wall. Definity contrast agent was given IV to delineate the left ventricular endocardial borders. The left ventricular internal cavity size was normal in size. There is no left ventricular hypertrophy. Abnormal (paradoxical) septal motion, consistent with left bundle branch block. Left ventricular diastolic parameters are indeterminate. Normal left ventricular filling pressure. Right Ventricle: The right ventricular size is normal. No increase in right ventricular wall thickness. Right ventricular systolic function is normal. There is normal pulmonary artery systolic pressure. The tricuspid regurgitant velocity is 1.98 m/s, and  with an assumed right atrial pressure of 3 mmHg, the estimated right ventricular systolic pressure is 09.3 mmHg. Left Atrium: Left atrial size was normal in size. Right Atrium: Right atrial size was normal in size. Pericardium: There is no evidence of pericardial effusion. Mitral Valve: The mitral valve is normal in structure. Trivial mitral valve regurgitation. No evidence of mitral valve stenosis. Tricuspid Valve: The tricuspid valve is normal in structure. Tricuspid valve regurgitation is trivial. No evidence of tricuspid stenosis. Aortic Valve: The aortic valve is normal in structure. Aortic valve regurgitation is not visualized. No aortic stenosis is present. Pulmonic Valve: The pulmonic  valve was normal in structure. Pulmonic valve regurgitation is not visualized. No evidence of pulmonic stenosis. Aorta: The aortic root is normal in size and structure. Venous: The inferior vena cava is normal in size with greater than 50% respiratory variability, suggesting right atrial pressure of 3 mmHg. IAS/Shunts: No atrial level shunt detected by color flow Doppler.  LEFT VENTRICLE PLAX 2D LVIDd:  5.10 cm  Diastology LVIDs:         4.00 cm  LV e' medial:    6.20 cm/s LV PW:         0.80 cm  LV E/e' medial:  10.0 LV IVS:        0.70 cm  LV e' lateral:   6.42 cm/s LVOT diam:     2.00 cm  LV E/e' lateral: 9.7 LV SV:         46 LV SV Index:   29 LVOT Area:     3.14 cm  RIGHT VENTRICLE RV Basal diam:  2.90 cm RV S prime:     9.14 cm/s TAPSE (M-mode): 2.0 cm LEFT ATRIUM             Index       RIGHT ATRIUM          Index LA diam:        2.90 cm 1.83 cm/m  RA Area:     8.05 cm LA Vol (A2C):   33.0 ml 20.77 ml/m RA Volume:   15.30 ml 9.63 ml/m LA Vol (A4C):   27.5 ml 17.31 ml/m LA Biplane Vol: 31.6 ml 19.89 ml/m  AORTIC VALVE LVOT Vmax:   74.40 cm/s LVOT Vmean:  54.100 cm/s LVOT VTI:    0.146 m  AORTA Ao Root diam: 2.80 cm MITRAL VALVE               TRICUSPID VALVE MV Area (PHT): 4.86 cm    TR Peak grad:   15.7 mmHg MV Decel Time: 156 msec    TR Vmax:        198.00 cm/s MV E velocity: 62.10 cm/s MV A velocity: 52.70 cm/s  SHUNTS MV E/A ratio:  1.18        Systemic VTI:  0.15 m                            Systemic Diam: 2.00 cm Fransico Him MD Electronically signed by Fransico Him MD Signature Date/Time: 02/14/2021/8:42:00 PM    Final (Amended)      Medications:     Scheduled Medications:  folic acid  1 mg Per Tube Daily   insulin aspart  2-6 Units Subcutaneous Q4H   multivitamin with minerals  1 tablet Per Tube Daily   polyethylene glycol  17 g Per Tube Daily   sodium chloride flush  10-40 mL Intracatheter Q12H   sodium chloride flush  3 mL Intravenous Q12H   thiamine injection  100 mg  Intravenous Daily    Infusions:  sodium chloride     [START ON 02/16/2021] sodium chloride     amiodarone 30 mg/hr (02/15/21 0600)   cefTRIAXone (ROCEPHIN)  IV Stopped (02/14/21 1910)   heparin 950 Units/hr (02/15/21 0600)   levETIRAcetam Stopped (02/14/21 2358)   magnesium sulfate bolus IVPB 2 g (02/15/21 0750)   norepinephrine (LEVOPHED) Adult infusion 2 mcg/min (02/15/21 0600)   potassium chloride 10 mEq (02/15/21 0746)    PRN Medications: sodium chloride, LORazepam, sodium chloride flush, sodium chloride flush     Assessment/Plan   1. COVID 19 Infection  - management per PCCM    2. VFib Arrest x 2 - OOH VT/VF arrest 9/6. Immediate BSCPR + defib w/ ROSC ~10 min  - In setting of hypokalemia/hypomagnesemia w/ prolonged QT interval  - recurrent in hospital PMVT arrest 9/7 w/ 2 min CPR + defib. In setting  of hypokalemia (3.1). Post arrest EKG w/ QT/QTC 492/549 ms, after IV Zofran administration. No STE  - Rhythm now stable on tele, but QTc remains prolonged on EKG  - Now on amio gtt. Continue at 30/hr  - Supp lytes. Keep K > 4.0 and Mg >2.0 - Avoid QT prolonging agents - Troponin trend and normal ESR argues against viral myocarditis  - Concern for coronary ischemia, will plan LHC today   - Will need EP assessment for possible ICD prior to d/c    3. Acute Systolic Heart Failure>>Shock   - Shock post arrest. Lactic Acid 3.6. Hypotensive requiring NE - Echo w/ severely reduced LVEF ~30%. No prior study for comparison  - Etiology uncertain. Trop trend argues against viral myocarditis. ? stress induced. Also ? Possible ETOH CM or familial (Maternal Uncle s/p heart transplant in his 50s) - Given recurrent ventricular arrhythmias, will need LHC to r/o coronary ischemia/CAD. Plan today  - Will need cMRI if cath unremarkable - if above w/u unremarkable, will need genetic testing - GDMT limited currently by hypotension, currently on NE 2   - Co-ox ok at 74%, Lactic acidosis resolved.  Wean NE as tolerated  - reduction in ETOH intake imperative    4. Hypokalemia/ Hypomagnesemia  - Supp. Keep K > 4.0 and Mg > 2.0    5. Prolonged QT Interval  - on Prozac for depression PTA. Currently on hold - Avoid other QT prolonging agents. D/c PRN Zofran - Keep K > 4.0 and Mg > 2.0    6. Seizures/ Neuro  - w/ post arrest memory loss  - Neuro following - EEG suggestive of diffuse encephalopathy, nonspecific etiology. No seizures or epileptiform discharges - on IV Keppra    7. ETOH Abuse  - H/o heavy use in the past, prior admit for ETOH pancreatitis  - ETOH level elevated on admit, c/w intoxication - Likely chronic abuse, MCV high at 103. Mg 1.7 on admit, Hepatic enzymes chronically elevated  - CIWA protocol per CCM   8. Hyperglycemia - h/o gestational DM  - Glucose severely elevated 580 - Insulin gtt per CCM  - Check Hgb A1c (last 5.3 in 2021)   9. Elevated LFTs - Chronically elevated, likely 2/2 ETOH use - AST 170>>87 - ALT 52>>39    10. ? Aspiration PNA - CCM covering w/ ceftriaxone     Plan LHC today w/ Dr. Haroldine Laws     Length of Stay: 2  Lyda Jester, PA-C  02/15/2021, 7:53 AM  Advanced Heart Failure Team Pager [phone] (M-F; 7a - 5p)  Please contact [facility] for night-coverage after hours (5p -7a ) and weekends on [email or web address]   Agree with above.   Remains on NE. Co-ox improved. On IV amio. No further VT. ECG with evolving anterior TWI and QT prolongation. Chest feels tight. Short-term memory remains poor.  General:  Lying in bed No resp difficulty HEENT: normal Neck: supple.  Right EJ catheter Carotids 2+ bilat; no bruits. No lymphadenopathy or thryomegaly appreciated. Cor: PMI nondisplaced. Regular rate & rhythm. No rubs, gallops or murmurs. Lungs: clear Abdomen: soft, nontender, nondistended. No hepatosplenomegaly. No bruits or masses. Good bowel sounds. Extremities: no cyanosis, clubbing, rash, edema Neuro: alert & orientedx3, cranial  nerves grossly intact. moves all 4 extremities w/o difficulty. Affect pleasant  My main concern remains ischemic heart disease. Will plan cath later today. (Discussed with patient and her fiance at bedside). Avoid QT prolonging meds. If cath ok will need cMRI.  CRITICAL  CARE Performed by: Glori Bickers  Total critical care time: 35 minutes  Critical care time was exclusive of separately billable procedures and treating other patients.  Critical care was necessary to treat or prevent imminent or life-threatening deterioration.  Critical care was time spent personally by me (independent of midlevel providers or residents) on the following activities: development of treatment plan with patient and/or surrogate as well as nursing, discussions with consultants, evaluation of patient's response to treatment, examination of patient, obtaining history from patient or surrogate, ordering and performing treatments and interventions, ordering and review of laboratory studies, ordering and review of radiographic studies, pulse oximetry and re-evaluation of patient's condition.  Glori Bickers, MD  1:31 PM

## 2021-02-15 NOTE — Progress Notes (Signed)
EKG completed and placed in patients paper chart. QTC 592/652. Abnormal, prolonged qtc. Normal sinus rhythm, t- wave abnormality, consider anterolateral ischemia per EKG reading.

## 2021-02-15 NOTE — H&P (View-Only) (Signed)
Advanced Heart Failure Rounding Note  PCP-Cardiologist: Dr. Haroldine Laws    Patient Profile   40 y/o woman with h/o ETOH abuse, chronic pancreatitis, depression. FHx of heart disease.    Admitted with Polymorphic VT/VF arrest in the setting of COVID 19 infection, acute ETOH intoxication and electrolyte abnormalities with QT prolongation. Intubated and shocked in field. Extubated here and had recurrent arrest.    Echo EF 30-35% with regional WMAs (septum, inferolateral wall and apex)  Hs trop 50   ECG without significant ischemic changes. QTc 583  Subjective:    No further VT/VF. On amio gtt at 30/hr. K 3.9. Mg 1.9. EKG today shows QT/QTcB 592/652 ms  Hs trop 50>>45 ESR normal, 8   On NE 2. Co-ox 74%. Lactic acid 3.8>>2.2>>1.7    Objective:   Weight Range: 57.9 kg Body mass index is 23.35 kg/m.   Vital Signs:   Temp:  [98 F (36.7 C)-100 F (37.8 C)] 99.1 F (37.3 C) (09/08 0645) Pulse Rate:  [62-119] 75 (09/08 0645) Resp:  [14-25] 20 (09/08 0645) BP: (86-132)/(60-102) 107/82 (09/08 0645) SpO2:  [90 %-100 %] 100 % (09/08 0645) FiO2 (%):  [40 %] 40 % (09/07 0800) Weight:  [57.9 kg] 57.9 kg (09/08 0500) Last BM Date: 02/08/21  Weight change: Filed Weights   02/14/21 0500 02/15/21 0006 02/15/21 0500  Weight: 58.7 kg 57.9 kg 57.9 kg    Intake/Output:   Intake/Output Summary (Last 24 hours) at 02/15/2021 0753 Last data filed at 02/15/2021 0600 Gross per 24 hour  Intake 1902.66 ml  Output 1351 ml  Net 551.66 ml      Physical Exam    General:  fatigued  appearing. No resp difficulty HEENT: Normal Neck: Supple. JVP elevated . Carotids 2+ bilat; no bruits. No lymphadenopathy or thyromegaly appreciated. Cor: PMI nondisplaced. Regular rate & rhythm. No rubs, gallops or murmurs. Lungs: Clear Abdomen: Soft, nontender, nondistended. No hepatosplenomegaly. No bruits or masses. Good bowel sounds. Extremities: No cyanosis, clubbing, rash, edema Neuro: Alert &  orientedx3, cranial nerves grossly intact. moves all 4 extremities w/o difficulty. Affect pleasant   Telemetry   NSR 80s. No further VT/VF  EKG    NSR 73 bpm, QT/QTcB 592/652 ms  Labs    CBC Recent Labs    02/14/21 1203 02/15/21 0433  WBC 8.8 9.6  NEUTROABS 7.9*  --   HGB 10.3* 9.5*  HCT 32.0* 28.8*  MCV 107.7* 107.5*  PLT 222 774   Basic Metabolic Panel Recent Labs    02/14/21 1340 02/14/21 2307 02/15/21 0433  NA 135 132* 130*  K 3.6 4.3 3.9  CL 105 100 96*  CO2 20* 22 22  GLUCOSE 138* 181* 258*  BUN 6 <5* <5*  CREATININE 0.71 0.48 0.44  CALCIUM 7.9* 7.7* 8.0*  MG 3.1* 1.9 1.9  PHOS 2.4* 4.2  --    Liver Function Tests Recent Labs    02/14/21 1340 02/15/21 0433  AST 170* 87*  ALT 52* 39  ALKPHOS 82 71  BILITOT 1.0 0.8  PROT 6.3* 5.9*  ALBUMIN 2.9* 2.6*   Recent Labs    02/13/21 2031  LIPASE 20   Cardiac Enzymes No results for input(s): CKTOTAL, CKMB, CKMBINDEX, TROPONINI in the last 72 hours.  BNP: BNP (last 3 results) No results for input(s): BNP in the last 8760 hours.  ProBNP (last 3 results) No results for input(s): PROBNP in the last 8760 hours.   D-Dimer No results for input(s): DDIMER in the last  72 hours. Hemoglobin A1C No results for input(s): HGBA1C in the last 72 hours. Fasting Lipid Panel Recent Labs    02/14/21 0325  TRIG 222*   Thyroid Function Tests No results for input(s): TSH, T4TOTAL, T3FREE, THYROIDAB in the last 72 hours.  Invalid input(s): FREET3  Other results:   Imaging    DG Chest Port 1 View  Result Date: 02/14/2021 CLINICAL DATA:  Cardiac arrest, post CPR EXAM: PORTABLE CHEST 1 VIEW COMPARISON:  CT 02/13/2021 FINDINGS: Endotracheal and nasogastric tubes have been removed. Defibrillator pads overlie the left lower chest. There is a left neck angio catheter. Persistent right lung airspace disease, increased in comparison to prior exam. No large pleural effusion or visible pneumothorax. Bones are  unchanged. IMPRESSION: Endotracheal tube and nasogastric tubes have been removed. Persistent right lung airspace disease, increased from prior exam. Electronically Signed   By: Maurine Simmering M.D.   On: 02/14/2021 12:26   Overnight EEG with video  Result Date: 02/14/2021 Lora Havens, MD     02/14/2021 11:14 AM Patient Name: MATALYNN GRAFF MRN: [id number] Epilepsy Attending: Lora Havens Referring Physician/Provider: Hayden Pedro, NP Duration: 02/13/2021 2013 to 02/14/2021 1027  Patient history: 39 y.o. female who presented to the ED for witnessed-seizure like activity at home witnessed by fiance then with EMS with VF arrest s/p defibrillation and 10 minutes of CPR with PEA until achieving ROSC. EEG to evaluate for seizure.  Level of alertness: awake, asleep  AEDs during EEG study: Keppra, propofol, Versed  Technical aspects: This EEG study was done with scalp electrodes positioned according to the 10-20 International system of electrode placement. Electrical activity was acquired at a sampling rate of 500Hz  and reviewed with a high frequency filter of 70Hz  and a low frequency filter of 1Hz . EEG data were recorded continuously and digitally stored.  Description: No clear posterior dominant rhythm was seen. Sleep was characterized by sleep spindles (12 to 14 Hz), maximal frontocentral region.  EEG showed continuous generalized predominantly 5 to 9 Hz theta and alpha activity as well as intermittent generalized 2 to 3 Hz delta slowing.  Hyperventilation and photic stimulation were not performed.    ABNORMALITY - Continuous slow, generalized  IMPRESSION: This study is suggestive of moderate diffuse encephalopathy, nonspecific etiology. No seizures or epileptiform discharges were seen throughout the recording.  Priyanka Barbra Sarks   ECHOCARDIOGRAM COMPLETE  Addendum Date: 02/14/2021   Report called to Cardiology fellow on call Dr. Conley Canal regarding possible apical septal thrombus by definity Fransico Him MD  Electronically Amended 02/14/2021, 9:09 PM   Final (Amended)    Result Date: 02/14/2021    ECHOCARDIOGRAM REPORT   Patient Name:   MILILANI MURTHY [place] Date of Exam: 02/14/2021 Medical Rec #:  [id number]         Height:       62.0 in Accession #:    [id number]        Weight:       129.4 lb Date of Birth:  10/03/1980        BSA:          1.589 m Patient Age:    39 years          BP:           109/81 mmHg Patient Gender: F                 HR:           84 bpm. Exam Location:  Inpatient  Procedure: 2D Echo, Cardiac Doppler, Color Doppler and Intracardiac            Opacification Agent STAT ECHO Indications:    Cardiac arrest  History:        Patient has no prior history of Echocardiogram examinations.                 Arrythmias:Cardiac Arrest and Ventricular Fibrillation; Risk                 Factors:Diabetes. Covid+, alcohol abuse.  Sonographer:    Dustin Flock RDCS Referring Phys: [place]  1. Possible layered mural thrombus overlying the apical septal wall. Left ventricular ejection fraction, by estimation, is 25 to 30%. The left ventricle has severely decreased function. The left ventricle has no regional wall motion abnormalities. Left ventricular diastolic parameters are indeterminate. There is akinesis of the left ventricular, mid inferoseptal wall and anteroseptal wall. There is severe hypokinesis of the left ventricular, mid-apical lateral wall. There is severe hypokinesis of the left ventricular, basal-mid inferolateral wall. There is severe hypokinesis of the left ventricular, mid-apical anterior wall.  2. Right ventricular systolic function is normal. The right ventricular size is normal. There is normal pulmonary artery systolic pressure.  3. The mitral valve is normal in structure. Trivial mitral valve regurgitation. No evidence of mitral stenosis.  4. The aortic valve is normal in structure. Aortic valve regurgitation is not visualized. No aortic stenosis is present.  5. The  inferior vena cava is normal in size with greater than 50% respiratory variability, suggesting right atrial pressure of 3 mmHg. FINDINGS  Left Ventricle: Possible layered mural thrombus overlying the apical septal wall. Left ventricular ejection fraction, by estimation, is 25 to 30%. The left ventricle has severely decreased function. The left ventricle has no regional wall motion abnormalities. Severe hypokinesis of the left ventricular, mid-apical lateral wall. Severe hypokinesis of the left ventricular, basal-mid inferolateral wall. Severe hypokinesis of the left ventricular, mid-apical anterior wall. Definity contrast agent was given IV to delineate the left ventricular endocardial borders. The left ventricular internal cavity size was normal in size. There is no left ventricular hypertrophy. Abnormal (paradoxical) septal motion, consistent with left bundle branch block. Left ventricular diastolic parameters are indeterminate. Normal left ventricular filling pressure. Right Ventricle: The right ventricular size is normal. No increase in right ventricular wall thickness. Right ventricular systolic function is normal. There is normal pulmonary artery systolic pressure. The tricuspid regurgitant velocity is 1.98 m/s, and  with an assumed right atrial pressure of 3 mmHg, the estimated right ventricular systolic pressure is 63.7 mmHg. Left Atrium: Left atrial size was normal in size. Right Atrium: Right atrial size was normal in size. Pericardium: There is no evidence of pericardial effusion. Mitral Valve: The mitral valve is normal in structure. Trivial mitral valve regurgitation. No evidence of mitral valve stenosis. Tricuspid Valve: The tricuspid valve is normal in structure. Tricuspid valve regurgitation is trivial. No evidence of tricuspid stenosis. Aortic Valve: The aortic valve is normal in structure. Aortic valve regurgitation is not visualized. No aortic stenosis is present. Pulmonic Valve: The pulmonic  valve was normal in structure. Pulmonic valve regurgitation is not visualized. No evidence of pulmonic stenosis. Aorta: The aortic root is normal in size and structure. Venous: The inferior vena cava is normal in size with greater than 50% respiratory variability, suggesting right atrial pressure of 3 mmHg. IAS/Shunts: No atrial level shunt detected by color flow Doppler.  LEFT VENTRICLE PLAX 2D LVIDd:  5.10 cm  Diastology LVIDs:         4.00 cm  LV e' medial:    6.20 cm/s LV PW:         0.80 cm  LV E/e' medial:  10.0 LV IVS:        0.70 cm  LV e' lateral:   6.42 cm/s LVOT diam:     2.00 cm  LV E/e' lateral: 9.7 LV SV:         46 LV SV Index:   29 LVOT Area:     3.14 cm  RIGHT VENTRICLE RV Basal diam:  2.90 cm RV S prime:     9.14 cm/s TAPSE (M-mode): 2.0 cm LEFT ATRIUM             Index       RIGHT ATRIUM          Index LA diam:        2.90 cm 1.83 cm/m  RA Area:     8.05 cm LA Vol (A2C):   33.0 ml 20.77 ml/m RA Volume:   15.30 ml 9.63 ml/m LA Vol (A4C):   27.5 ml 17.31 ml/m LA Biplane Vol: 31.6 ml 19.89 ml/m  AORTIC VALVE LVOT Vmax:   74.40 cm/s LVOT Vmean:  54.100 cm/s LVOT VTI:    0.146 m  AORTA Ao Root diam: 2.80 cm MITRAL VALVE               TRICUSPID VALVE MV Area (PHT): 4.86 cm    TR Peak grad:   15.7 mmHg MV Decel Time: 156 msec    TR Vmax:        198.00 cm/s MV E velocity: 62.10 cm/s MV A velocity: 52.70 cm/s  SHUNTS MV E/A ratio:  1.18        Systemic VTI:  0.15 m                            Systemic Diam: 2.00 cm Fransico Him MD Electronically signed by Fransico Him MD Signature Date/Time: 02/14/2021/8:42:00 PM    Final (Amended)      Medications:     Scheduled Medications:  folic acid  1 mg Per Tube Daily   insulin aspart  2-6 Units Subcutaneous Q4H   multivitamin with minerals  1 tablet Per Tube Daily   polyethylene glycol  17 g Per Tube Daily   sodium chloride flush  10-40 mL Intracatheter Q12H   sodium chloride flush  3 mL Intravenous Q12H   thiamine injection  100 mg  Intravenous Daily    Infusions:  sodium chloride     [START ON 02/16/2021] sodium chloride     amiodarone 30 mg/hr (02/15/21 0600)   cefTRIAXone (ROCEPHIN)  IV Stopped (02/14/21 1910)   heparin 950 Units/hr (02/15/21 0600)   levETIRAcetam Stopped (02/14/21 2358)   magnesium sulfate bolus IVPB 2 g (02/15/21 0750)   norepinephrine (LEVOPHED) Adult infusion 2 mcg/min (02/15/21 0600)   potassium chloride 10 mEq (02/15/21 0746)    PRN Medications: sodium chloride, LORazepam, sodium chloride flush, sodium chloride flush     Assessment/Plan   1. COVID 19 Infection  - management per PCCM    2. VFib Arrest x 2 - OOH VT/VF arrest 9/6. Immediate BSCPR + defib w/ ROSC ~10 min  - In setting of hypokalemia/hypomagnesemia w/ prolonged QT interval  - recurrent in hospital PMVT arrest 9/7 w/ 2 min CPR + defib. In setting  of hypokalemia (3.1). Post arrest EKG w/ QT/QTC 492/549 ms, after IV Zofran administration. No STE  - Rhythm now stable on tele, but QTc remains prolonged on EKG  - Now on amio gtt. Continue at 30/hr  - Supp lytes. Keep K > 4.0 and Mg >2.0 - Avoid QT prolonging agents - Troponin trend and normal ESR argues against viral myocarditis  - Concern for coronary ischemia, will plan LHC today   - Will need EP assessment for possible ICD prior to d/c    3. Acute Systolic Heart Failure>>Shock   - Shock post arrest. Lactic Acid 3.6. Hypotensive requiring NE - Echo w/ severely reduced LVEF ~30%. No prior study for comparison  - Etiology uncertain. Trop trend argues against viral myocarditis. ? stress induced. Also ? Possible ETOH CM or familial (Maternal Uncle s/p heart transplant in his 46s) - Given recurrent ventricular arrhythmias, will need LHC to r/o coronary ischemia/CAD. Plan today  - Will need cMRI if cath unremarkable - if above w/u unremarkable, will need genetic testing - GDMT limited currently by hypotension, currently on NE 2   - Co-ox ok at 74%, Lactic acidosis resolved.  Wean NE as tolerated  - reduction in ETOH intake imperative    4. Hypokalemia/ Hypomagnesemia  - Supp. Keep K > 4.0 and Mg > 2.0    5. Prolonged QT Interval  - on Prozac for depression PTA. Currently on hold - Avoid other QT prolonging agents. D/c PRN Zofran - Keep K > 4.0 and Mg > 2.0    6. Seizures/ Neuro  - w/ post arrest memory loss  - Neuro following - EEG suggestive of diffuse encephalopathy, nonspecific etiology. No seizures or epileptiform discharges - on IV Keppra    7. ETOH Abuse  - H/o heavy use in the past, prior admit for ETOH pancreatitis  - ETOH level elevated on admit, c/w intoxication - Likely chronic abuse, MCV high at 103. Mg 1.7 on admit, Hepatic enzymes chronically elevated  - CIWA protocol per CCM   8. Hyperglycemia - h/o gestational DM  - Glucose severely elevated 580 - Insulin gtt per CCM  - Check Hgb A1c (last 5.3 in 2021)   9. Elevated LFTs - Chronically elevated, likely 2/2 ETOH use - AST 170>>87 - ALT 52>>39    10. ? Aspiration PNA - CCM covering w/ ceftriaxone     Plan LHC today w/ Dr. Haroldine Laws     Length of Stay: 2  Lyda Jester, PA-C  02/15/2021, 7:53 AM  Advanced Heart Failure Team Pager [phone] (M-F; 7a - 5p)  Please contact [facility] for night-coverage after hours (5p -7a ) and weekends on [email or web address]   Agree with above.   Remains on NE. Co-ox improved. On IV amio. No further VT. ECG with evolving anterior TWI and QT prolongation. Chest feels tight. Short-term memory remains poor.  General:  Lying in bed No resp difficulty HEENT: normal Neck: supple.  Right EJ catheter Carotids 2+ bilat; no bruits. No lymphadenopathy or thryomegaly appreciated. Cor: PMI nondisplaced. Regular rate & rhythm. No rubs, gallops or murmurs. Lungs: clear Abdomen: soft, nontender, nondistended. No hepatosplenomegaly. No bruits or masses. Good bowel sounds. Extremities: no cyanosis, clubbing, rash, edema Neuro: alert & orientedx3, cranial  nerves grossly intact. moves all 4 extremities w/o difficulty. Affect pleasant  My main concern remains ischemic heart disease. Will plan cath later today. (Discussed with patient and her fiance at bedside). Avoid QT prolonging meds. If cath ok will need cMRI.  CRITICAL  CARE Performed by: Glori Bickers  Total critical care time: 35 minutes  Critical care time was exclusive of separately billable procedures and treating other patients.  Critical care was necessary to treat or prevent imminent or life-threatening deterioration.  Critical care was time spent personally by me (independent of midlevel providers or residents) on the following activities: development of treatment plan with patient and/or surrogate as well as nursing, discussions with consultants, evaluation of patient's response to treatment, examination of patient, obtaining history from patient or surrogate, ordering and performing treatments and interventions, ordering and review of laboratory studies, ordering and review of radiographic studies, pulse oximetry and re-evaluation of patient's condition.  Glori Bickers, MD  1:31 PM

## 2021-02-16 ENCOUNTER — Inpatient Hospital Stay (HOSPITAL_COMMUNITY): Payer: Medicaid Other

## 2021-02-16 ENCOUNTER — Other Ambulatory Visit (HOSPITAL_COMMUNITY): Payer: Self-pay

## 2021-02-16 ENCOUNTER — Encounter (HOSPITAL_COMMUNITY): Payer: Self-pay | Admitting: Internal Medicine

## 2021-02-16 DIAGNOSIS — I469 Cardiac arrest, cause unspecified: Secondary | ICD-10-CM

## 2021-02-16 DIAGNOSIS — I4581 Long QT syndrome: Secondary | ICD-10-CM

## 2021-02-16 DIAGNOSIS — F1010 Alcohol abuse, uncomplicated: Secondary | ICD-10-CM

## 2021-02-16 LAB — CBC
HCT: 29.1 % — ABNORMAL LOW (ref 36.0–46.0)
Hemoglobin: 9.6 g/dL — ABNORMAL LOW (ref 12.0–15.0)
MCH: 35 pg — ABNORMAL HIGH (ref 26.0–34.0)
MCHC: 33 g/dL (ref 30.0–36.0)
MCV: 106.2 fL — ABNORMAL HIGH (ref 80.0–100.0)
Platelets: 149 10*3/uL — ABNORMAL LOW (ref 150–400)
RBC: 2.74 MIL/uL — ABNORMAL LOW (ref 3.87–5.11)
RDW: 12.6 % (ref 11.5–15.5)
WBC: 7.9 10*3/uL (ref 4.0–10.5)
nRBC: 0 % (ref 0.0–0.2)

## 2021-02-16 LAB — HEPARIN LEVEL (UNFRACTIONATED)
Heparin Unfractionated: <0.1 IU/mL — ABNORMAL LOW (ref 0.30–0.70)
Heparin Unfractionated: 0.1 IU/mL — ABNORMAL LOW (ref 0.30–0.70)
Heparin Unfractionated: 0.23 IU/mL — ABNORMAL LOW (ref 0.30–0.70)

## 2021-02-16 LAB — OSMOLALITY: Osmolality: 280 mOsm/kg (ref 275–295)

## 2021-02-16 LAB — GLUCOSE, CAPILLARY
Glucose-Capillary: 136 mg/dL — ABNORMAL HIGH (ref 70–99)
Glucose-Capillary: 105 mg/dL — ABNORMAL HIGH (ref 70–99)
Glucose-Capillary: 111 mg/dL — ABNORMAL HIGH (ref 70–99)
Glucose-Capillary: 141 mg/dL — ABNORMAL HIGH (ref 70–99)
Glucose-Capillary: 121 mg/dL — ABNORMAL HIGH (ref 70–99)

## 2021-02-16 LAB — HEMOGLOBIN A1C
Hgb A1c MFr Bld: 5.2 % (ref 4.8–5.6)
Mean Plasma Glucose: 102.54 mg/dL

## 2021-02-16 LAB — BASIC METABOLIC PANEL
Anion gap: 7 (ref 5–15)
BUN: <5 mg/dL — ABNORMAL LOW (ref 6–20)
CO2: 23 mmol/L (ref 22–32)
Calcium: 7.9 mg/dL — ABNORMAL LOW (ref 8.9–10.3)
Chloride: 102 mmol/L (ref 98–111)
Creatinine, Ser: 0.46 mg/dL (ref 0.44–1.00)
GFR, Estimated: >60 mL/min (ref >60)
Glucose, Bld: 127 mg/dL — ABNORMAL HIGH (ref 70–99)
Potassium: 3.8 mmol/L (ref 3.5–5.1)
Sodium: 132 mmol/L — ABNORMAL LOW (ref 135–145)

## 2021-02-16 LAB — MAGNESIUM: Magnesium: 1.9 mg/dL (ref 1.7–2.4)

## 2021-02-16 LAB — PROTIME-INR
INR: 1.1 (ref 0.8–1.2)
Prothrombin Time: 14.6 seconds (ref 11.4–15.2)

## 2021-02-16 LAB — COOXEMETRY PANEL
Carboxyhemoglobin: 0.9 % (ref 0.5–1.5)
Methemoglobin: 0.7 % (ref 0.0–1.5)
O2 Saturation: 67.7 %
Total hemoglobin: 9.9 g/dL — ABNORMAL LOW (ref 12.0–16.0)

## 2021-02-16 LAB — PHOSPHORUS: Phosphorus: 3.6 mg/dL (ref 2.5–4.6)

## 2021-02-16 MED ORDER — ADULT MULTIVITAMIN W/MINERALS CH
1.0000 | ORAL_TABLET | Freq: Every day | ORAL | Status: DC
  Administered 2021-02-17 – 2021-02-20 (×4): 1 via ORAL
  Filled 2021-02-16 (×5): qty 1

## 2021-02-16 MED ORDER — POLYETHYLENE GLYCOL 3350 17 G PO PACK
17.0000 g | PACK | Freq: Every day | ORAL | Status: DC
  Filled 2021-02-16 (×3): qty 1

## 2021-02-16 MED ORDER — POTASSIUM CHLORIDE 10 MEQ/100ML IV SOLN: 10.0000 meq | INTRAVENOUS | Status: DC

## 2021-02-16 MED ORDER — SACUBITRIL-VALSARTAN 24-26 MG PO TABS
1.0000 | ORAL_TABLET | Freq: Two times a day (BID) | ORAL | Status: DC
  Administered 2021-02-16 – 2021-02-20 (×9): 1 via ORAL
  Filled 2021-02-16 (×10): qty 1

## 2021-02-16 MED ORDER — SPIRONOLACTONE 12.5 MG HALF TABLET
12.5000 mg | ORAL_TABLET | Freq: Every day | ORAL | Status: DC
  Administered 2021-02-16 – 2021-02-18 (×3): 12.5 mg via ORAL
  Filled 2021-02-16 (×3): qty 1

## 2021-02-16 MED ORDER — NADOLOL 20 MG PO TABS
10.0000 mg | ORAL_TABLET | Freq: Two times a day (BID) | ORAL | Status: DC
  Filled 2021-02-16: qty 1

## 2021-02-16 MED ORDER — FOLIC ACID 1 MG PO TABS
1.0000 mg | ORAL_TABLET | Freq: Every day | ORAL | Status: DC
  Administered 2021-02-17 – 2021-02-20 (×4): 1 mg via ORAL
  Filled 2021-02-16 (×5): qty 1

## 2021-02-16 MED ORDER — GADOBUTROL 1 MMOL/ML IV SOLN
7.0000 mL | Freq: Once | INTRAVENOUS | Status: AC | PRN
  Administered 2021-02-16: 7 mL via INTRAVENOUS

## 2021-02-16 MED ORDER — MAGNESIUM SULFATE 2 GM/50ML IV SOLN
2.0000 g | Freq: Once | INTRAVENOUS | Status: AC
  Administered 2021-02-16: 2 g via INTRAVENOUS
  Filled 2021-02-16: qty 50

## 2021-02-16 MED ORDER — HEPARIN BOLUS VIA INFUSION
850.0000 [IU] | Freq: Once | INTRAVENOUS | Status: AC
  Administered 2021-02-16: 850 [IU] via INTRAVENOUS
  Filled 2021-02-16: qty 850

## 2021-02-16 MED ORDER — POTASSIUM CHLORIDE 10 MEQ/50ML IV SOLN
10.0000 meq | INTRAVENOUS | Status: AC
  Administered 2021-02-16 (×2): 10 meq via INTRAVENOUS
  Filled 2021-02-16: qty 50

## 2021-02-16 MED ORDER — NADOLOL 20 MG PO TABS
20.0000 mg | ORAL_TABLET | Freq: Two times a day (BID) | ORAL | Status: DC
  Administered 2021-02-16 – 2021-02-20 (×8): 20 mg via ORAL
  Filled 2021-02-16 (×9): qty 1

## 2021-02-16 NOTE — Progress Notes (Addendum)
CSW attempted to speak with Ms. Apsey to introduce self as the heart failure Education officer, museum and to complete a very brief SDOH screening with the patient to address social needs and complete a Cage Aid Screen for the SA consult as needed however the patient did not answer her phone as Ms. Branscom is COVID+ and CSW left a voicemail for her to return the call.  4:27pm - CSW attempted to outreach Ms. Rusk again for the SA consult however no answer and CSW left another voicemail.  CSW will continue to follow for discharge needs and will check back with the patient at another time.  Chitara Clonch, MSW, [place] Heart Failure Social Worker

## 2021-02-16 NOTE — Progress Notes (Addendum)
Advanced Heart Failure Rounding Note  PCP-Cardiologist: Dr. Haroldine Laws    Patient Profile   40 y/o woman with h/o ETOH abuse, chronic pancreatitis, depression. FHx of heart disease.    Admitted with Polymorphic VT/VF arrest in the setting of COVID 19 infection, acute ETOH intoxication and electrolyte abnormalities with QT prolongation. Intubated and shocked in field. Extubated here and had recurrent arrest.    Echo EF 30-35% with regional WMAs (septum, inferolateral wall and apex)  Hs trop 50   ECG without significant ischemic changes. QTc 583  Subjective:   RHC/LHC -RA 3, PCWP 12, CO 3.5, CI 2.3 & normal coronaries.   Norepi stopped earlier today.    Complaining of chest soreness.     Objective:   Weight Range: 56.3 kg Body mass index is 22.7 kg/m.   Vital Signs:   Temp:  [97.9 F (36.6 C)-100.2 F (37.9 C)] 97.9 F (36.6 C) (09/09 0334) Pulse Rate:  [75-102] 88 (09/09 0645) Resp:  [9-34] 23 (09/09 0645) BP: (94-152)/(65-102) 121/92 (09/09 0645) SpO2:  [80 %-100 %] 95 % (09/09 0645) Weight:  [56.3 kg] 56.3 kg (09/09 0500) Last BM Date: 02/15/21  Weight change: Filed Weights   02/15/21 0006 02/15/21 0500 02/16/21 0500  Weight: 57.9 kg 57.9 kg 56.3 kg    Intake/Output:   Intake/Output Summary (Last 24 hours) at 02/16/2021 0731 Last data filed at 02/16/2021 0650 Gross per 24 hour  Intake 1054.3 ml  Output 740 ml  Net 314.3 ml      Physical Exam  CVP 3.   General:  No resp difficulty HEENT: normal Neck: supple. no JVD. Carotids 2+ bilat; no bruits. No lymphadenopathy or thryomegaly appreciated. Cor: PMI nondisplaced. Regular rate & rhythm. No rubs, gallops or murmurs. Lungs: clear Abdomen: soft, nontender, nondistended. No hepatosplenomegaly. No bruits or masses. Good bowel sounds. Extremities: no cyanosis, clubbing, rash, edema. RUE PICC Neuro: alert & orientedx3, cranial nerves grossly intact. moves all 4 extremities w/o difficulty. Affect  pleasant   Telemetry   NSR 70-100 with brief NSVT   EKG    NSR 73 bpm, QT/QTcB 592/652 ms  Labs    CBC Recent Labs    02/14/21 1203 02/15/21 0433 02/15/21 1636 02/15/21 1647 02/16/21 0440  WBC 8.8 9.6  --   --  7.9  NEUTROABS 7.9*  --   --   --   --   HGB 10.3* 9.5*   < > 10.9* 9.6*  HCT 32.0* 28.8*   < > 32.0* 29.1*  MCV 107.7* 107.5*  --   --  106.2*  PLT 222 185  --   --  149*   < > = values in this interval not displayed.   Basic Metabolic Panel Recent Labs    02/14/21 2307 02/15/21 0433 02/15/21 1636 02/15/21 1647 02/16/21 0440  NA 132* 130*   < > 135 132*  K 4.3 3.9   < > 4.1 3.8  CL 100 96*  --   --  102  CO2 22 22  --   --  23  GLUCOSE 181* 258*  --   --  127*  BUN <5* <5*  --   --  <5*  CREATININE 0.48 0.44  --   --  0.46  CALCIUM 7.7* 8.0*  --   --  7.9*  MG 1.9 1.9  --   --  1.9  PHOS 4.2  --   --   --  3.6   < > = values  in this interval not displayed.   Liver Function Tests Recent Labs    02/14/21 1340 02/15/21 0433  AST 170* 87*  ALT 52* 39  ALKPHOS 82 71  BILITOT 1.0 0.8  PROT 6.3* 5.9*  ALBUMIN 2.9* 2.6*   Recent Labs    02/13/21 2031  LIPASE 20   Cardiac Enzymes No results for input(s): CKTOTAL, CKMB, CKMBINDEX, TROPONINI in the last 72 hours.  BNP: BNP (last 3 results) No results for input(s): BNP in the last 8760 hours.  ProBNP (last 3 results) No results for input(s): PROBNP in the last 8760 hours.   D-Dimer No results for input(s): DDIMER in the last 72 hours. Hemoglobin A1C Recent Labs    02/16/21 0440  HGBA1C 5.2   Fasting Lipid Panel Recent Labs    02/14/21 0325  TRIG 222*   Thyroid Function Tests No results for input(s): TSH, T4TOTAL, T3FREE, THYROIDAB in the last 72 hours.  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 Will need cMRI and EP evaluation for possible ICD. Wean NE. Start GDMT. Glori Bickers, MD 5:42 PM    Medications:     Scheduled Medications:  folic acid  1 mg Oral Daily   insulin aspart  0-5 Units Subcutaneous QHS   insulin aspart  0-9 Units Subcutaneous TID WC   multivitamin with minerals  1 tablet Oral Daily   polyethylene glycol  17 g Oral Daily   sodium chloride flush  10-40 mL Intracatheter Q12H   sodium chloride flush  3 mL Intravenous Q12H   thiamine injection  100 mg Intravenous Daily    Infusions:  sodium chloride 10 mL/hr at 02/16/21 0000   amiodarone 30 mg/hr (02/16/21 0551)   cefTRIAXone (ROCEPHIN)  IV Stopped (02/15/21 1919)   heparin 1,350 Units/hr (02/16/21 0545)   levETIRAcetam Stopped (02/15/21 2356)   magnesium sulfate bolus IVPB     potassium chloride      PRN Medications: sodium chloride, acetaminophen, guaiFENesin-dextromethorphan, LORazepam, menthol-cetylpyridinium, sodium chloride flush, sodium chloride flush     Assessment/Plan   1. COVID 19 Infection  - management per PCCM    2. VFib Arrest x 2 - OOH VT/VF arrest 9/6. Immediate BSCPR + defib w/ ROSC ~10 min  - In setting of hypokalemia/hypomagnesemia w/ prolonged QT interval  - recurrent in hospital PMVT arrest 9/7 w/ 2 min CPR + defib. In setting of hypokalemia (3.1). Post arrest EKG w/ QT/QTC 492/549 ms, after IV Zofran administration. No STE  - Rhythm now stable on tele, but QTc remains prolonged on EKG  - Continue amio gtt. Continue at 30/hr  -  Keep K > 4.0 and Mg >2.0. Given 2 grams Mag.  - Avoid QT prolonging agents - Troponin trend and normal ESR argues against viral myocarditis  - LHC no cornary disease.  -  Consult EP.     3. Acute Systolic Heart Failure>>Shock   - Shock post arrest. Lactic Acid 3.6. Hypotensive requiring NE - Echo w/ severely reduced LVEF ~30%. No prior study  for comparison  - Etiology uncertain. Trop trend argues against viral myocarditis. ? stress induced. Also ? Possible ETOH CM or familial (Maternal Uncle s/p heart transplant in his 16s) - Given recurrent ventricular arrhythmias had LHC. LHC with no coronary disease.   - Will need cMRI  - if above w/u unremarkable, will need genetic testing - BP stable off norepi. Starting GDMT slowly.   -Add 12.5 mg spiro daily.  - No ARNI, BB, SGLT2i yet.  - reduction in ETOH intake imperative    4. Hypokalemia/ Hypomagnesemia  - Supp. Keep K > 4.0 and Mg > 2.0  - K 3.8 . K supp Add 12.5 spiro.  - Give 2 grams mag.    5. Prolonged QT Interval  - on Prozac for depression PTA. Currently on hold she wants to restart. - Keppra possible QT prolonging.   - Avoid other QT prolonging agents. Qtc 527 -  - Keep K > 4.0 and Mg > 2.0 . Supp K and Mag    6. Seizures/ Neuro  - w/ post arrest memory loss  - Neuro following - EEG suggestive of diffuse encephalopathy, nonspecific etiology. No seizures or epileptiform discharges - on IV Keppra    7. ETOH Abuse  - H/o heavy use in the past, prior admit for ETOH pancreatitis  - ETOH level elevated on admit, c/w intoxication - Likely chronic abuse, MCV high at 103. Mg 1.7 on admit, Hepatic enzymes chronically elevated  - CIWA protocol per CCM   8. Hyperglycemia - h/o gestational DM  - Glucose severely elevated 580 - Insulin gtt per CCM  - Check Hgb A1c (last 5.3 in 2021)   9. Elevated LFTs - Chronically elevated, likely 2/2 ETOH use - AST 170>>87 - ALT 52>>39    10. ? Aspiration PNA - CCM covering w/ ceftriaxone   11. Depression  -PTA on prozac. She wants to restart if possible.  - QTc 527 today  - Will discuss with EP/pharmacy team    EP consulted.   Length of Stay: 3  Amy  Clegg, NP  02/16/2021, 7:31 AM  Advanced Heart Failure Team Pager [phone] (M-F; 7a - 5p)  Please contact [facility] for night-coverage after hours (5p -7a ) and weekends on [email or web address]  Patient seen and examined with the above-signed Advanced Practice Provider and/or Housestaff. I personally reviewed laboratory data, imaging studies and relevant notes. I independently examined the patient and formulated the important aspects of the plan. I have edited the note to reflect any of my changes or salient points. I have personally discussed the plan with the patient and/or family.  She remains confused. Had brief NSVT yesterday. None since. Cath with no CAD. Compensated hemodynamics.   Now off NE.  CVP 7 (checked personally)   ECG with improving anterolateral TWI QTC down to 527  General:  Confuse No resp difficulty HEENT: normal Neck: supple. no JVD. Carotids 2+ bilat; no bruits. No lymphadenopathy or thryomegaly appreciated. Cor: PMI nondisplaced. Regular rate & rhythm. No rubs, gallops or murmurs. Lungs: clear Abdomen: soft, nontender, nondistended. No hepatosplenomegaly. No bruits or masses. Good bowel sounds. Extremities: no cyanosis, clubbing, rash, edema Neuro: alert confused cranial nerves grossly intact. moves all 4 extremities w/o difficulty. Affect pleasant  Cath yesterday with no CAD. Etiology of VT remains unclear suspect possible underlying cardiomyopathy (?ETOH) with electrolyte abnormalities. However she also has baseline QT prolongation on previous ECG on 11/05/19 so may have long QT syndrome. cMRI pending.  EP to see today.  Start spiro and Entresto.   Management of amio per EP.   Remains on heparin for ? LV thrombus. If no thrombus on cMRI can stop.   OK to go to floor.   Glori Bickers, MD  12:54 PM

## 2021-02-16 NOTE — Progress Notes (Signed)
ANTICOAGULATION CONSULT NOTE   Pharmacy Consult for IV heparin Indication: apical mural thrombus  Allergies  Allergen Reactions   Ibuprofen Anaphylaxis   Ondansetron Hcl Other (See Comments)    Led to QT prolongation and cardiac arrest   Chlorhexidine Other (See Comments)   Penicillins Nausea And Vomiting   Effexor [Venlafaxine] Palpitations and Other (See Comments)    Panic attacks   Latex Rash    Tape irritates the skin/ please use paper tape   Tape Rash    pls use paper tape    Patient Measurements: Height: 5' 2"  (157.5 cm) Weight: 56.3 kg (124 lb 1.9 oz) IBW/kg (Calculated) : 50.1 Heparin Dosing Weight: 58.7 kg  Vital Signs: Temp: 98.4 F (36.9 C) (09/09 2004) Temp Source: Oral (09/09 2004) BP: 112/90 (09/09 1900) Pulse Rate: 76 (09/09 1900)  Labs: Recent Labs    02/14/21 1203 02/14/21 1340 02/14/21 1841 02/14/21 2307 02/15/21 0433 02/15/21 1254 02/15/21 1644 02/15/21 1647 02/16/21 0440 02/16/21 0450 02/16/21 1050 02/16/21 2000  HGB 10.3*  --   --   --  9.5*   < > 9.5* 10.9* 9.6*  --   --   --   HCT 32.0*  --   --   --  28.8*   < > 28.0* 32.0* 29.1*  --   --   --   PLT 222  --   --   --  185  --   --   --  149*  --   --   --   LABPROT  --   --   --   --   --   --   --   --   --   --  14.6  --   INR  --   --   --   --   --   --   --   --   --   --  1.1  --   HEPARINUNFRC  --   --   --   --  0.17*   < >  --   --   --  <0.10* 0.10* 0.23*  CREATININE 0.63   < >  --  0.48 0.44  --   --   --  0.46  --   --   --   TROPONINIHS  --   --  50* 45*  --   --   --   --   --   --   --   --    < > = values in this interval not displayed.     Estimated Creatinine Clearance: 74.7 mL/min (by C-G formula based on SCr of 0.46 mg/dL).   Medical History: Past Medical History:  Diagnosis Date   AKI (acute kidney injury) ([place]) 06/15/2019   admitted with delhydration r/o arf   Alcohol abuse    Alcoholic hepatitis without ascites    Depression    Family history of  adverse reaction to anesthesia    MOTHER HAD PANIC ATTACKS & NAUSEA  AFTER   Gastritis    GIB (gastrointestinal bleeding)    Hypertension    Iron deficiency anemia    Pancreatitis    PONV (postoperative nausea and vomiting)     Medications:  Infusions:   sodium chloride 10 mL/hr at 02/16/21 1900   cefTRIAXone (ROCEPHIN)  IV Stopped (02/16/21 1829)   heparin 1,550 Units/hr (02/16/21 1931)   levETIRAcetam Stopped (02/16/21 1524)    Assessment: 40 yo female admitted after  cardiac arrest.  Pharmacy asked to start IV heparin for apical mural thrombus seen on ECHO.  Not taking anticoagulants PTA.  Heparin level 0.23 @1550  units/hr. No bleeding issues noted. Hemoglobin stable overnight 9.6 this am. Plt count down to 149 (240 on admit).   Goal of Therapy:  Heparin level 0.3-0.7 units/ml Monitor platelets by anticoagulation protocol: Yes   Plan:  Increase heparin gtt to 1650 units/hr. HL with am labs Follow up starting po anticoagulation if no procedures are planned.  Daily heparin level and CBC.  Alanda Slim, PharmD, [facility] Clinical Pharmacist Please see AMION for all Pharmacists' Contact Phone Numbers 02/16/2021, 8:58 PM

## 2021-02-16 NOTE — Progress Notes (Signed)
  EP recommending Life Vest at d/c and GDMT.   Ordered placed for [place] contacted [place] representative. TOC consult placed.   Yarimar Lavis NP-C  2:52 PM

## 2021-02-16 NOTE — Consult Note (Addendum)
Cardiology Consultation:   Patient ID: TARSHA BLANDO MRN: [id number]; DOB: 08-14-1980  Admit date: 02/13/2021 Date of Consult: 02/16/2021  PCP:  Cathlean Sauer, MD   [facility] Providers Cardiologist:  None     Patient Profile:   Susan [name] is a 40 y.o. female with a hx of ETOH use, alcoholic pancreatitis, and depression,  who is being seen 02/16/2021 for the evaluation of CM/cardiac arrest/long QT at the request of Dr. Haroldine Laws.  History of Present Illness:   Susan [name] in review of record notes most recently, she gave birth 16 months ago. Her pregnancy c/b gestational DM and preeclampsia, diagnosed w/ COVID 19 6 days prior to her admission. She was admitted to [facility] comes via EMS after a witnessed cardiac arrest she received bystander CPR, Found to be in VFib on EMS arrival. Treated w/ epi + defibrillation. ROSC achieved after ~10 min w/ ? Seizure like activity pre arrest. Transferred to ED and intubated. Hypotensive on arrival requiring levophed. Intoxicated w/ Alcohol, Ethyl level elevated at 104.  Initial K 3.0, Mg 1.7.  EKG sinus tach 132 w/ prolonged QT/QTC, 393/583 ms. Troponins not checked. Head CT negative. CTA of chest negative for PE. CXR w/ patchy airspace disease in the right perihilar lung, favoring atelectasis versus pneumonia. WBC 8.1, Hgb 13, Scr 0.76, CO2 13, Lactic acid 3.8. Admitted by PCCM and placed on TTM  Able to extubate the following day, 02/14/21, subsequently developed nausea/vomiting and given zofran >> ~1hr later, had recurrent polymorphic VT/Torsades. Received CPR + Defib w/ ROSC in ~2 min. Post arrest  EKG showed prolonged QT/QTc, 492/549 ms  HF team consulted with echo finding EF 28%, acute systolic HF/shock, planned for LHC and c.MRI when able, her prozac held 2/2 QT prolongation.  LHC noted no CAD and EP is asked to weigh in for consideration of ICD implant.  Weaned off pressors, last this AM, she has been maintained on amiodarone HF note  reviewed: Etiology felt uncertain. Trop trend argues against viral myocarditis. ? stress induced. Also ? Possible ETOH CM or familial (Maternal Uncle s/p heart transplant in his 28s).  LVEF by TTE 25-30%, + WMA  LABS K+ 3.0, 3.0, 2.4, 3.6 >>>> 3.8 Mag 1.7, 1.7 >>>>> 1.9 BUN/Creat 6/0.76  >>>> 0.46 WBC 8.1 H/H 13/41 >>> 9.6/29 Plts 240 >>> 149 HS Trop 50, 45   Home meds include: Prozac Prn zofran Protonix  1/2 life of prozac is 1-3days for parent drug, 9 days for metabolites (both longer w/cirrhosis)   The patient's fiancee is at bedside, states she was laying in bed when she seemed to suddenly convulse and gasping for air, he could not find a pulse called 911 and started CPR, EMS arrived, he reports they had to shock her once and was able to get a pulse within about 41mnutes total time.  The patient has no recollection of the events, does not recall an recent symptoms of any kind. She does have hx of some dizziness upon standing and perhaps 1-2 episodes of fainting that she felt weak, had warning and woke quickly, these she attributed to poor eating habits.  She was taking Prozac every day,and has been for a long time, she would use zofran at home particularly around her menses, but not the last few days prior to her event  Past Medical History:  Diagnosis Date   AKI (acute kidney injury) ([place] 06/15/2019   admitted with delhydration r/o arf   Alcohol abuse    Alcoholic hepatitis  without ascites    Depression    Family history of adverse reaction to anesthesia    MOTHER HAD PANIC ATTACKS & NAUSEA  AFTER   Gastritis    GIB (gastrointestinal bleeding)    Hypertension    Iron deficiency anemia    Pancreatitis    PONV (postoperative nausea and vomiting)     Past Surgical History:  Procedure Laterality Date   CERVICAL CERCLAGE Bilateral 01/18/2019   Procedure: CERCLAGE CERVICAL;  Surgeon: Everett Graff, MD;  Location: [facility];  Service: Gynecology;  Laterality:  Bilateral;   DILATATION AND CURETTAGE/HYSTEROSCOPY WITH MINERVA N/A 08/23/2019   Procedure: DILATATION AND CURETTAGE/HYSTEROSCOPY WITH MINERVA;  Surgeon: Waymon Amato, MD;  Location: [facility];  Service: Gynecology;  Laterality: N/A;  rep will be here for this case confirmed on 08/18/19.   ESOPHAGOGASTRODUODENOSCOPY (EGD) WITH PROPOFOL N/A 06/05/2016   Procedure: ESOPHAGOGASTRODUODENOSCOPY (EGD) WITH PROPOFOL;  Surgeon: Otis Brace, MD;  Location: [facility];  Service: Gastroenterology;  Laterality: N/A;   PLACEMENT OF BREAST IMPLANTS     RIGHT/LEFT HEART CATH AND CORONARY ANGIOGRAPHY N/A 02/15/2021   Procedure: RIGHT/LEFT HEART CATH AND CORONARY ANGIOGRAPHY;  Surgeon: Jolaine Artist, MD;  Location: [facility];  Service: Cardiovascular;  Laterality: N/A;     Home Medications:  Prior to Admission medications   Medication Sig Start Date End Date Taking? Authorizing Provider  cetirizine (ZYRTEC) 10 MG tablet Take 10 mg by mouth daily as needed for allergies.   Yes [provider]  FLUoxetine (PROZAC) 20 MG capsule Take 20 mg by mouth daily. 01/25/21  Yes [provider]  Multiple Vitamin (MULTIVITAMIN WITH MINERALS) TABS tablet Take 1 tablet by mouth daily. Patient not taking: Reported on 02/13/2021 02/19/20   Aline August, MD  ondansetron (ZOFRAN) 4 MG tablet Take 1 tablet (4 mg total) by mouth every 6 (six) hours as needed for nausea or vomiting. Patient not taking: Reported on 02/13/2021 02/19/20   Aline August, MD  pantoprazole (PROTONIX) 40 MG tablet Take 1 tablet (40 mg total) by mouth daily. 02/19/20 03/20/20  Aline August, MD  thiamine 100 MG tablet Take 1 tablet (100 mg total) by mouth daily. Patient not taking: Reported on 02/13/2021 02/19/20   Aline August, MD  traMADol (ULTRAM) 50 MG tablet Take 1 tablet (50 mg total) by mouth every 6 (six) hours as needed for severe pain. Patient not taking: Reported on 02/13/2021 02/19/20   Aline August,  MD    Inpatient Medications: Scheduled Meds:  folic acid  1 mg Oral Daily   insulin aspart  0-5 Units Subcutaneous QHS   insulin aspart  0-9 Units Subcutaneous TID WC   multivitamin with minerals  1 tablet Oral Daily   polyethylene glycol  17 g Oral Daily   sodium chloride flush  10-40 mL Intracatheter Q12H   sodium chloride flush  3 mL Intravenous Q12H   spironolactone  12.5 mg Oral Daily   thiamine injection  100 mg Intravenous Daily   Continuous Infusions:  sodium chloride 10 mL/hr at 02/16/21 1100   amiodarone 30 mg/hr (02/16/21 1100)   cefTRIAXone (ROCEPHIN)  IV Stopped (02/15/21 1919)   heparin 1,350 Units/hr (02/16/21 1100)   levETIRAcetam Stopped (02/15/21 2356)   PRN Meds: sodium chloride, acetaminophen, guaiFENesin-dextromethorphan, LORazepam, menthol-cetylpyridinium, sodium chloride flush, sodium chloride flush  Allergies:    Allergies  Allergen Reactions   Ibuprofen Anaphylaxis   Ondansetron Hcl Other (See Comments)    Led to QT prolongation and cardiac  arrest   Chlorhexidine Other (See Comments)   Penicillins Nausea And Vomiting   Effexor [Venlafaxine] Palpitations and Other (See Comments)    Panic attacks   Latex Rash    Tape irritates the skin/ please use paper tape   Tape Rash    pls use paper tape    Social History:   Social History   Socioeconomic History   Marital status: Single    Spouse name: Not on file   Number of children: Not on file   Years of education: Not on file   Highest education level: Not on file  Occupational History   Occupation: Careers adviser, currently unemployed  Tobacco Use   Smoking status: Former    Packs/day: 0.50    Years: 15.00    Pack years: 7.50    Types: Cigarettes    Quit date: 2015    Years since quitting: 7.6   Smokeless tobacco: Never  Vaping Use   Vaping Use: Never used  Substance and Sexual Activity   Alcohol use: Yes   Drug use: No   Sexual activity: Yes    Birth control/protection: Injection     Comment: depo injection  Other Topics Concern   Not on file  Social History Narrative   Not on file   Social Determinants of Health   Financial Resource Strain: Not on file  Food Insecurity: Not on file  Transportation Needs: Not on file  Physical Activity: Not on file  Stress: Not on file  Social Connections: Not on file  Intimate Partner Violence: Not on file    Family History:   Family History  Problem Relation Age of Onset   Stroke Mother    Diabetes Maternal Grandmother      ROS:  Please see the history of present illness.  All other ROS reviewed and negative.     Physical Exam/Data:   Vitals:   02/16/21 1045 02/16/21 1100 02/16/21 1115 02/16/21 1130  BP: 98/79 112/88 120/84 124/86  Pulse: 82 82 86 85  Resp: (!) 27 17 (!) 26 (!) 34  Temp:      TempSrc:      SpO2: 98% 98% 97% 98%  Weight:      Height:        Intake/Output Summary (Last 24 hours) at 02/16/2021 1228 Last data filed at 02/16/2021 1100 Gross per 24 hour  Intake 1447.42 ml  Output 570 ml  Net 877.42 ml   Last 3 Weights 02/16/2021 02/15/2021 02/15/2021  Weight (lbs) 124 lb 1.9 oz 127 lb 10.3 oz 127 lb 10.3 oz  Weight (kg) 56.3 kg 57.9 kg 57.9 kg  Some encounter information is confidential and restricted. Go to Review Flowsheets activity to see all data.     Body mass index is 22.7 kg/m.  General:  Well nourished, well developed, in no acute distress HEENT: normal Lymph: no adenopathy Neck: no JVD Endocrine:  No thryomegaly Vascular: No carotid bruits  Cardiac:  RRR; no murmurs, gallops or rubs Lungs:  CTA b/l no wheezing, rhonchi or rales  Abd: soft, nontender  Ext: no edema Musculoskeletal:  No deformities Skin: warm and dry  Neuro:  no focal abnormalities noted Psych:  Normal affect   EKG:  The EKG was personally reviewed and demonstrates:    ST 132bpm, QT 393, QTc 542m 02/15/21; SR 73bpm, QT 592, Qtc 6563mToday SR 84, QT 446, Qtc 527  OLD 11/05/19 ST 139bpm, QTc 601  Telemetry:   Telemetry was personally reviewed and demonstrates:  SR, no recurrent V arrhythmias   Relevant CV Studies:  02/15/21: R/LHC On norepinephrine 2   Ao = 112/78 (94) LV = 111/22 RA =  3 RV = 26/5 PA = 25/12 (19) PCW = 12 Fick cardiac output/index = 3.6/2.3 PVR = 1.9 SVR = 2,011 Ao sat = 95% PA sat = 58%, 60%   Assessment: Normal coronaries  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 Possible layered mural thrombus overlying the apical septal wall. Left  ventricular ejection fraction, by estimation, is 25 to 30%. The left  ventricle has severely decreased function. The left ventricle has no  regional wall motion abnormalities. Left  ventricular diastolic parameters are indeterminate. There is akinesis of  the left ventricular, mid inferoseptal wall and anteroseptal wall. There  is severe hypokinesis of the left ventricular, mid-apical lateral wall.  There is severe hypokinesis of the  left ventricular, basal-mid inferolateral wall. There is severe  hypokinesis of the left ventricular, mid-apical anterior wall.   2. Right ventricular systolic function is normal. The right ventricular  size is normal. There is normal pulmonary artery systolic pressure.   3. The mitral valve is normal in structure. Trivial mitral valve  regurgitation. No evidence of mitral stenosis.   4. The aortic valve is normal in structure. Aortic valve regurgitation is  not visualized. No aortic stenosis is present.   5. The inferior vena cava is normal in size with greater than 50%  respiratory variability, suggesting right atrial pressure of 3 mmHg.  Laboratory Data:  High Sensitivity Troponin:   Recent Labs  Lab 02/14/21 1841 02/14/21 2307  TROPONINIHS 50* 45*     Chemistry Recent Labs  Lab 02/14/21 2307 02/15/21 0433 02/15/21 1636 02/15/21 1644 02/15/21 1647 02/16/21 0440  NA 132* 130*   < > 139 135 132*  K  4.3 3.9   < > 3.7 4.1 3.8  CL 100 96*  --   --   --  102  CO2 22 22  --   --   --  23  GLUCOSE 181* 258*  --   --   --  127*  BUN <5* <5*  --   --   --  <5*  CREATININE 0.48 0.44  --   --   --  0.46  CALCIUM 7.7* 8.0*  --   --   --  7.9*  GFRNONAA >60 >60  --   --   --  >60  ANIONGAP 10 12  --   --   --  7   < > = values in this interval not displayed.    Recent Labs  Lab 02/14/21 1203 02/14/21 1340 02/15/21 0433  PROT 5.8* 6.3* 5.9*  ALBUMIN 2.6* 2.9* 2.6*  AST 153* 170* 87*  ALT 46* 52* 39  ALKPHOS 73 82 71  BILITOT 1.3* 1.0 0.8   Hematology Recent Labs  Lab 02/14/21 1203 02/15/21 0433 02/15/21 1636 02/15/21 1644 02/15/21 1647 02/16/21 0440  WBC 8.8 9.6  --   --   --  7.9  RBC 2.97* 2.68*  --   --   --  2.74*  HGB 10.3* 9.5*   < > 9.5* 10.9* 9.6*  HCT 32.0* 28.8*   < > 28.0* 32.0* 29.1*  MCV 107.7* 107.5*  --   --   --  106.2*  MCH 34.7* 35.4*  --   --   --  35.0*  MCHC 32.2 33.0  --   --   --  33.0  RDW 12.8 12.7  --   --   --  12.6  PLT 222 185  --   --   --  149*   < > = values in this interval not displayed.   BNPNo results for input(s): BNP, PROBNP in the last 168 hours.  DDimer No results for input(s): DDIMER in the last 168 hours.   Radiology/Studies:  CT Angio Head W or Wo Contrast Result Date: 02/13/2021 CLINICAL DATA:  Neuro deficit, acute, stroke suspected EXAM: CT HEAD WITHOUT CONTRAST CT ANGIOGRAPHY OF THE HEAD AND NECK TECHNIQUE: Contiguous axial images were obtained from the base of the skull through the vertex without intravenous contrast. Multidetector CT imaging of the head and neck was performed using the standard protocol during bolus administration of intravenous contrast. Multiplanar CT image reconstructions and MIPs were obtained to evaluate the vascular anatomy. Carotid stenosis measurements (when applicable) are obtained utilizing NASCET criteria, using the distal internal carotid diameter as the denominator. CONTRAST:  187m OMNIPAQUE  IOHEXOL 350 MG/ML SOLN COMPARISON:  None. FINDINGS: CT HEAD Brain: No evidence of acute infarction, hemorrhage, hydrocephalus, extra-axial collection or mass lesion/mass effect. Vascular: See below. Skull: No acute fracture. Sinuses/Orbits: Visualized sinuses are clear. No acute findings in the visualized orbits. CTA NECK Aortic arch: Great vessel origins are patent. Right carotid system: Patent. No significant (greater than 50%) stenosis. Left carotid system: Patent. No significant (greater than 50%) stenosis. Vertebral arteries:Codominant.Patent. No significant (greater than 50%) stenosis. Skeleton: No evidence of acute osseous abnormality in the cervical spine. Other neck: No evidence of acute abnormality. Chest: Please see concurrent CT chest for intrathoracic evaluation. CTA HEAD Severely motion limited evaluation.  Within this limitation: Anterior circulation: Lateralized course of the petrous ICAs bilaterally which appear to still have bony covering. Bilateral intracranial ICAs, MCAs and ACAs are patent proximally. Severely limited evaluation of the distal MCAs and ACAs due to patient motion. Posterior circulation: Bilateral intradural vertebral arteries, basilar artery and proximal posterior cerebral arteries are patent. Left posterior communicating artery with small left P1 PCA, anatomic variant. Limited evaluation of the distal PCAs due to patient motion. Venous sinuses: Poorly evaluated due to arterial timing and motion. Anatomic variants: As detailed above. IMPRESSION: CT head: No evidence of acute intracranial abnormality. CTA neck: No significant (greater than 50%) stenosis. CTA head: Severely motion limited exam without evidence of a large vessel occlusion. Electronically Signed   By: FMargaretha SheffieldM.D.   On: 02/13/2021 17:54    CT Angio Chest PE W and/or Wo Contrast Result Date: 02/13/2021 CLINICAL DATA:  Syncope, simple, normal neuro exam Post CPR. EXAM: CT ANGIOGRAPHY CHEST WITH CONTRAST  TECHNIQUE: Multidetector CT imaging of the chest was performed using the standard protocol during bolus administration of intravenous contrast. Multiplanar CT image reconstructions and MIPs were obtained to evaluate the vascular anatomy. CONTRAST:  1072mOMNIPAQUE IOHEXOL 350 MG/ML SOLN COMPARISON:  Radiograph earlier today. FINDINGS: Cardiovascular: There are no filling defects within the pulmonary arteries to suggest pulmonary embolus. Normal caliber thoracic aorta. Can not assess for dissection given phase of contrast tailored to pulmonary artery evaluation. The heart is normal in size. No pericardial effusion. Mediastinum/Nodes: Adjustment of the endotracheal tube with tip now above the carina. There is an enteric tube within the esophagus, despite tube decompression the esophagus is patulous. No mediastinal adenopathy. No hilar adenopathy. No evidence of mediastinal hemorrhage. Lungs/Pleura: Multifocal airspace disease with slight  areas of ground-glass nodularity throughout both lungs. There is more confluent superimposed consolidation involving the dependent right upper lobe and central right middle lobe. No septal thickening. Trace right pleural thickening but no significant effusion. There is no debris within the trachea or central bronchi. Upper Abdomen: Suspected hepatic steatosis not well assessed on this arterial phase exam. Musculoskeletal: Possible nondisplaced left anterior fourth rib fracture. No other acute osseous findings. Bilateral breast implants. No chest wall soft tissue abnormalities. Review of the MIP images confirms the above findings. IMPRESSION: 1. No pulmonary embolus. 2. Confluent right upper lobe consolidation consistent with pneumonia, possible aspiration. Background ground-glass nodularity throughout both lungs likely bronchiolitis, typically infectious or inflammatory. 3. Adjustment of the endotracheal tube with tip now above the carina. Enteric tube within the esophagus, despite  tube decompression the esophagus is patulous. 4. Possible nondisplaced left anterior fourth rib fracture. Electronically Signed   By: Keith Rake M.D.   On: 02/13/2021 18:01    DG Chest Port 1 View Result Date: 02/14/2021 CLINICAL DATA:  Cardiac arrest, post CPR EXAM: PORTABLE CHEST 1 VIEW COMPARISON:  CT 02/13/2021 FINDINGS: Endotracheal and nasogastric tubes have been removed. Defibrillator pads overlie the left lower chest. There is a left neck angio catheter. Persistent right lung airspace disease, increased in comparison to prior exam. No large pleural effusion or visible pneumothorax. Bones are unchanged. IMPRESSION: Endotracheal tube and nasogastric tubes have been removed. Persistent right lung airspace disease, increased from prior exam. Electronically Signed   By: Maurine Simmering M.D.   On: 02/14/2021 12:26     Assessment and Plan:   Cardiac arrest NICM LVEF 25-30% by TTE Normal coronaries Long QT on last year's EKG as well  Presents OOH post arrest initial K+ 3.0,  Mag 1.7 Known hx of ETOH abuse ETOH level 104  No known prior cardiac history, no historical cardiac data Unclear etiology for her CM, though there are a few possibilities C.MRI is done, pending reading  Her arrest here in the environment of baseline prolonged QT, on prozac and given zofran   Potentially reversible issues Dr. Lovena Le will see her later today though in brief review of her case, initial recommendations would be to avoid/stop all potential QT prolonging drugs Start GDMT as soon as able Life vest  Her EKG today has some improvement in her QT    Risk Assessment/Risk Scores:    For questions or updates, please contact [place] Please consult [email or web address] for contact info under    Signed, Baldwin Jamaica, PA-C  02/16/2021 12:28 PM  EP Attending  Patient seen and examined. Agree with the findings as above. The patient has had a couple of cardiac arrests due to VF. Review of her old  ECG demonstrates that she has QT prolongation dating back 3 years. The combination of hypokalemia, and prozaac has resulted in a dramatically prolonged QT. Complicating issues is that she has a cardiomyopathy which could be due to her arrest and/or ETOH. Her MRI demonstrates an EF of 37% with no gadolinium uptake. She continues to drink. Her exam demonstrates a somnolent but well appearing woman, NAD. Lungs are clear, CV reveals a regular tachy and extremities demonstrate no edema.  ECG's are reviewed dating back to 2019. She has QTC at baseline of over 500 which puts her at increased risk if untreated with a beta blocker. A/P LV dysfunction - I strongly suspect that on GDMT and ETOH cessation her EF will normalize. MRI did not show Gad uptake.  Long  QT - she desperately needs to stop all drugs that prolong the QT including IV amiodarone. She has been prescribed a low dose of nadolol. Sudden death prevention - she will need a life vest. If her EF normalizes and QT improves then we could discontinue the life vest in 3 months. If she has recurrent VF on medical therapy then an ICD would be recommended. If her EF does not improve the ICD would be recommended at that time.  ETOH abuse - if she does not stop drinking she will live a very short life. I explained this to her. Discussed with Darrick Grinder NP of the CHF team.   Salome Spotted

## 2021-02-16 NOTE — Progress Notes (Addendum)
ANTICOAGULATION CONSULT NOTE   Pharmacy Consult for IV heparin Indication: apical mural thrombus  Allergies  Allergen Reactions   Ibuprofen Anaphylaxis   Ondansetron Hcl Other (See Comments)    Led to QT prolongation and cardiac arrest   Chlorhexidine Other (See Comments)   Penicillins Nausea And Vomiting   Effexor [Venlafaxine] Palpitations and Other (See Comments)    Panic attacks   Latex Rash    Tape irritates the skin/ please use paper tape   Tape Rash    pls use paper tape    Patient Measurements: Height: 5' 2"  (157.5 cm) Weight: 56.3 kg (124 lb 1.9 oz) IBW/kg (Calculated) : 50.1 Heparin Dosing Weight: 58.7 kg  Vital Signs: Temp: 98 F (36.7 C) (09/09 0814) Temp Source: Oral (09/09 0814) BP: 124/86 (09/09 1130) Pulse Rate: 85 (09/09 1130)  Labs: Recent Labs    02/14/21 1203 02/14/21 1203 02/14/21 1340 02/14/21 1841 02/14/21 2307 02/15/21 0433 02/15/21 1254 02/15/21 1636 02/15/21 1644 02/15/21 1647 02/16/21 0440 02/16/21 0450 02/16/21 1050  HGB 10.3*  --   --   --   --  9.5*  --    < > 9.5* 10.9* 9.6*  --   --   HCT 32.0*  --   --   --   --  28.8*  --    < > 28.0* 32.0* 29.1*  --   --   PLT 222  --   --   --   --  185  --   --   --   --  149*  --   --   LABPROT  --   --   --   --   --   --   --   --   --   --   --   --  14.6  INR  --   --   --   --   --   --   --   --   --   --   --   --  1.1  HEPARINUNFRC  --    < >  --   --   --  0.17* 0.16*  --   --   --   --  <0.10* 0.10*  CREATININE 0.63  --    < >  --  0.48 0.44  --   --   --   --  0.46  --   --   TROPONINIHS  --   --   --  50* 45*  --   --   --   --   --   --   --   --    < > = values in this interval not displayed.     Estimated Creatinine Clearance: 74.7 mL/min (by C-G formula based on SCr of 0.46 mg/dL).   Medical History: Past Medical History:  Diagnosis Date   AKI (acute kidney injury) ([place]) 06/15/2019   admitted with delhydration r/o arf   Alcohol abuse    Alcoholic hepatitis  without ascites    Depression    Family history of adverse reaction to anesthesia    MOTHER HAD PANIC ATTACKS & NAUSEA  AFTER   Gastritis    GIB (gastrointestinal bleeding)    Hypertension    Iron deficiency anemia    Pancreatitis    PONV (postoperative nausea and vomiting)     Medications:  Infusions:   sodium chloride 10 mL/hr at 02/16/21 1100   amiodarone 30 mg/hr (  02/16/21 1100)   cefTRIAXone (ROCEPHIN)  IV Stopped (02/15/21 1919)   heparin 1,350 Units/hr (02/16/21 1100)   levETIRAcetam Stopped (02/15/21 2356)    Assessment: 40 yo female admitted after cardiac arrest.  Pharmacy asked to start IV heparin for apical mural thrombus seen on ECHO.  Not taking anticoagulants PTA.  Heparin level 0.1 today 1350 units/hr. No bleeding issues noted. Hemoglobin stable overnight 9.6 this am. Plt count down to 149 (240 on admit).   No obstruction seen on cath 9/8. Will give small bolus to try and get therapeutic.   Goal of Therapy:  Heparin level 0.3-0.7 units/ml Monitor platelets by anticoagulation protocol: Yes   Plan:  Bolus 850 units  Increase heparin gtt to 1550 units/hr. HL in 6 hours Follow up starting po anticoagulation if no procedures are planned.  Daily heparin level and CBC.  Cathrine Muster, PharmD PGY2 Cardiology Pharmacy Resident Phone: [phone] 02/16/2021  1:03 PM  Please check [email or web address] for unit-specific pharmacy phone numbers.

## 2021-02-16 NOTE — Progress Notes (Signed)
MRI called patients Cardiac MRI rescheduled to 11am. Will do brain MRI at same time.

## 2021-02-16 NOTE — Progress Notes (Signed)
Patients cough continues at this time. O2 saturation at 90%. Riley at 3 L increased to 4 L. Patient encouraged to take slow deep breaths thru her nose. HOB elevated. Patient encouraged to use incentive spherometer. Patient now experience Nausea and vomiting. Zofran given see MAR

## 2021-02-16 NOTE — Progress Notes (Signed)
NAME:  Susan [name], MRN:  [id number], DOB:  Oct 09, 1980, LOS: 3 ADMISSION DATE:  02/13/2021, CONSULTATION DATE: 9/6 REFERRING MD: Dr. 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  She has recent admissions to the [facility] for alcoholic ketoacidosis, pancreatitis.  She also has recent office visits for major depressive disorder.  She was at home in her usual state of health until 9/6 when she had witnessed seizure-like activity.  This was witnessed by her fianc who called EMS and started CPR.  Upon EMS arrival the patient was reportedly awake, although it is unclear what her true level of consciousness was.  She did again had seizure-like activity which progressed to cardiac arrest.  Initial rhythm was ventricular fibrillation and she was defibrillated.  She then underwent 10 additional minutes of ACLS with PEA at each additional rhythm check until ROSC.  She was transported to [facility] emergency department she was intubated upon arrival.  No purposeful movements were identified in the emergency department. PCCM asked to admit.   Pertinent  Medical History    has a past medical history of AKI (acute kidney injury) ([place]) (06/15/2019), Alcohol abuse, Alcoholic 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   Significant Hospital Events: Including procedures, antibiotic start and stop dates in addition to other pertinent events   9/6 admit for sz, cardiac arrest. Ceftriaxone> Flagyl 9/6>9/7 9/7 TTM stopped due to GCS 11T, extubated. V fib arrest, 2 min cpr. QT prolonged. Cards consulted EF 30-35% 9/8 R/L HC: Normal coronaries. 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 Moderately  depressed CO on low dose NE  Interim History / Subjective:  Heart cath yesterday  Received x2 doses ondansetron overnight  Tmax 100.1  +1.4L admit, 740 UOP  2LNC  Off levo, on 19m/hr amio, heparin gtt  Subjective: Endorses sharp chest pain and chest tightness, endorses nausea and vomiting. Denies abdominal pain.  Objective   Blood pressure (!) 121/92, pulse 88, temperature 97.9 F (36.6 C), temperature source Oral, resp. rate (!) 23, height 5' 2"  (1.575 m), weight 56.3 kg, SpO2 95 %, not currently breastfeeding. CVP:  [4 mmHg-8 mmHg] 7 mmHg      Intake/Output Summary (Last 24 hours) at 02/16/2021 0742 Last data filed at 02/16/2021 0650 Gross per 24 hour  Intake 1054.3 ml  Output 740 ml  Net 314.3 ml   Filed Weights   02/15/21 0006 02/15/21 0500 02/16/21 0500  Weight: 57.9 kg 57.9 kg 56.3 kg    Examination: General:  in bed, NAD, appears comfortable HEENT: MM pink/moist, anicteric, atraumatic Neuro: GCS 15, RASS 0, PERRL 335mCV: S1S2, NSR, no m/r/g appreciated PULM:  Clear in the upper lobes, clear in the lower lobes, Trachea midline, chest expansion symmetric GI: soft, bsx4 active, non tender, non distended Extremities: warm/dry, no pretibial edema, capillary refill less than 3 seconds  Skin: no rashes or lesions  Resolved Hospital Problem list   Lactic acidosis  Assessment & Plan:   Cardiac arrest- x2 Cardiogenic Shock-Improving Acute systolic heart failure Prolonged QT NICM Initial rhythm VF.  Admit CTA head and CTA chest without obvious cause. Polymorphic CT/Torsades arrest again on 9/7 after extubation and 1 hr after zofran administration. 7m59mcpr with 1 shock. Prolonged QT on ekg. Trop 50>45. Coox 58.4>74.2.  Endorses sharp chest pain when breathing. Suspect secondary to chest compressions x2. On 2 of NE, down from 10. ECHO with EF 30-35% with regional WMA. ? Of laminated apical septal thrombus on echo per cards. Cardiac cath with normal coronaries, NICM with  EF 30-35%, CI 2.3 on low dose NE. Off levophed overnight. 9/9 AM QTC 527, no st changes seen. -Cardiac MRI today -Cardiology consulted. Appreciate assistance. GDMT per cardiology. -Continue Amio gtt. Appreciate cards input. -Goal K above 4, goal MG above 2 -Avoid QT prolonging agents -Heparin GTT per pharmacy -PRN EKG -Will need EP consult post cardiac MRI  COVID 19  Acute hypoxemic respiratory failure-Improving Aspiration PNA Secondary to arrest. No leukocytosis. Extubated on 9/7. On 2LNC. Stable  from a pulmonary standpoint. Tmax 100.2, no leukocytosis. Started on 4LNC overnight, weaned back to [facility] -Continue Pulmonary toilet with IS -PRN robitussin DM and throat lozenges.  -Continue ceftriaxone day 3/5. -NGTD on cultures  Seizure: suspect secondary to arhythmia  -Management and workup per neurology -On keppra. AED per neuro. -MRI brain today.  Transaminitis-improving ?shock liver vs alcoholic hepatitis. AST 170>87, ALT 52>39 -Supportive care -Monitor LFT PRN  Diarrhea Vomiting ?cardiac vs ETOH withdrawal.  -Continue ativan for nausea -No Zofran for nausea d/t qt prolonging. Patient went into torsades 1 hour after admin previously. Denies abdominal pain -Monitor -Follow up RUQ [place] -supportive care  Anemia, Macrocytic HGB 9.6, MCV 106, no signs of active bleedign -Follow up methylmalonic acid level -On folate, MV.   Hypokalemia-Improved Hypomagnesia Hyponatremia K 3.8, MG 1.9 , NA 135>132>130>132. ? Secondary to vomiting and nausea? Serum osm 280 -Goal K above 4, goal MG above 2 -Replete -Follow up AM labs -no free h20 -Check urine osm today  DM: following pregnancy 13 months prior. Family does not feels she has not been treating this well.  BG 139 to 204. A1C 5.2 -Blood Glucose goal 140-180. -SSI  Alcohol abuse history, but recently has been drinking a glass of wine -4 days per week per husband. ETOH 104 on admission -Continue thiamine, folate,  MVI -Continue CIWA  Best Practice (right click and "Reselect all SmartList Selections" daily)   Diet/type: full liquids  DVT prophylaxis: systemic heparin GI prophylaxis: PPI Lines: Central line Foley:  N/A Code Status:  full code Last date of multidisciplinary goals of care discussion [9/6]   Critical care time: Acacia Villas Paula Busenbark, Jr., MSN, APRN, AGACNP-BC [facility]  02/16/2021 , 7:42 AM  Please see [email or web address] for pager details  If no response, please call [phone] After hours, please call Elink at [phone]

## 2021-02-16 NOTE — Progress Notes (Signed)
EKG performed and placed in patients paper chart. EKG reads NSR. Cannot rule out anterior infarct, age undetermined, prolonged Q abnormal EKG. Qtc 446/527.

## 2021-02-16 NOTE — TOC CM/SW Note (Signed)
HF TOC CM faxed demo, H&P, progress and orders for LifeVest to [place], and plan is to process and deliver this evening. [place], Heart Failure TOC CM [phone]

## 2021-02-16 NOTE — TOC Progression Note (Addendum)
Transition of Care [name] - Progression Note    Patient Details  Name: Susan [name] MRN: [id number] Date of Birth: 08/13/1980  Transition of Care [facility]) CM/SW Contact  Erenest Rasher, RN Phone Number: [phone] 02/16/2021, 5:39 PM  Clinical Narrative:    HF TOC CM spoke to pt and states she lives in home with significant other, Kyle. Explained Life Vest was orders and scheduled delivery this evening. Pt states her copay for meds is $3. Sent message for HF Pharmacy to check if auth needed for Entresto.    Expected Discharge Plan: Home/Self Care Barriers to Discharge: Continued Medical Work up  Expected Discharge Plan and Services Expected Discharge Plan: Home/Self Care In-house Referral: Chaplain, Nutrition (For Advanced Directives) Discharge Planning Services: CM Consult   Living arrangements for the past 2 months: Single Family Home                 DME Arranged: Life vest DME Agency: [facility] Date DME Agency Contacted: 02/16/21 Time DME Agency Contacted: [phone] Representative spoke with at DME Agency: Samul Dada             Social Determinants of Health ([place]) Interventions    Readmission Risk Interventions No flowsheet data found.

## 2021-02-16 NOTE — Progress Notes (Signed)
   02/16/21 1000  Clinical Encounter Type  Visited With Patient not available  Visit Type Initial  Referral From Nurse  Consult/Referral To Chaplain   Chaplain responded to consult request for an Advance Directive. Unable to provide AD education and notary service for the following reasons: due to precautions measures on the patient's door, and hospital volunteers unable to enter room to witness the patient's signature. Advance Directive is available once precautions are removed. This note was prepared by Jeanine Luz, M.Div..  For questions please contact by phone [phone].

## 2021-02-16 NOTE — Progress Notes (Signed)
ANTICOAGULATION CONSULT NOTE   Pharmacy Consult for IV heparin Indication: apical mural thrombus  Allergies  Allergen Reactions   Ibuprofen Anaphylaxis   Ondansetron Hcl Other (See Comments)    Led to QT prolongation and cardiac arrest   Chlorhexidine Other (See Comments)   Penicillins Nausea And Vomiting   Effexor [Venlafaxine] Palpitations and Other (See Comments)    Panic attacks   Latex Rash    Tape irritates the skin/ please use paper tape   Tape Rash    pls use paper tape    Patient Measurements: Height: 5' 2"  (157.5 cm) Weight: 56.3 kg (124 lb 1.9 oz) IBW/kg (Calculated) : 50.1 Heparin Dosing Weight: 58.7 kg  Vital Signs: Temp: 97.9 F (36.6 C) (09/09 0334) Temp Source: Oral (09/09 0334) BP: 122/92 (09/09 0400) Pulse Rate: 80 (09/09 0400)  Labs: Recent Labs    02/14/21 1203 02/14/21 1340 02/14/21 1841 02/14/21 2307 02/15/21 0433 02/15/21 1254 02/15/21 1636 02/15/21 1644 02/15/21 1647 02/16/21 0440 02/16/21 0450  HGB 10.3*  --   --   --  9.5*  --    < > 9.5* 10.9* 9.6*  --   HCT 32.0*  --   --   --  28.8*  --    < > 28.0* 32.0* 29.1*  --   PLT 222  --   --   --  185  --   --   --   --  149*  --   HEPARINUNFRC  --   --   --   --  0.17* 0.16*  --   --   --   --  <0.10*  CREATININE 0.63 0.71  --  0.48 0.44  --   --   --   --   --   --   TROPONINIHS  --   --  50* 45*  --   --   --   --   --   --   --    < > = values in this interval not displayed.     Estimated Creatinine Clearance: 74.7 mL/min (by C-G formula based on SCr of 0.44 mg/dL).   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 ascites    Depression    Family history of adverse reaction to anesthesia    MOTHER HAD PANIC ATTACKS & NAUSEA  AFTER   Gastritis    GIB (gastrointestinal bleeding)    Hypertension    Iron deficiency anemia    Pancreatitis     PONV (postoperative nausea and vomiting)     Medications:  Infusions:   sodium chloride 10 mL/hr at 02/16/21 0000   amiodarone 30 mg/hr (02/16/21 0200)   cefTRIAXone (ROCEPHIN)  IV Stopped (02/15/21 1919)   heparin 1,150 Units/hr (02/16/21 0258)   levETIRAcetam Stopped (02/15/21 2356)   norepinephrine (LEVOPHED) Adult infusion Stopped (02/16/21 0221)    Assessment: 40 yo female admitted after cardiac arrest.  Pharmacy asked to start IV heparin for apical mural thrombus seen on ECHO.  Not taking anticoagulants PTA.  Heparin level undetectable this morning on 1150 units/hr. No bleeding issues noted. Hemoglobin stable overnight 9.6 this am. Plt count down to 149 (240 on admit).   No obstruction seen on cath 9/8.   Goal of Therapy:  Heparin level 0.3-0.7 units/ml Monitor platelets by anticoagulation protocol: Yes   Plan:  Increase heparin gtt to 1350 units/hr. Follow up starting po anticoagulation if no procedures are planned.  Will check INR with next lab draw in case warfarin started.  Daily heparin level and CBC.  Erin Hearing PharmD., BCPS Clinical Pharmacist 02/16/2021 5:29 AM

## 2021-02-17 DIAGNOSIS — I10 Essential (primary) hypertension: Secondary | ICD-10-CM

## 2021-02-17 DIAGNOSIS — R7401 Elevation of levels of liver transaminase levels: Secondary | ICD-10-CM

## 2021-02-17 DIAGNOSIS — I469 Cardiac arrest, cause unspecified: Secondary | ICD-10-CM | POA: Diagnosis not present

## 2021-02-17 DIAGNOSIS — F329 Major depressive disorder, single episode, unspecified: Secondary | ICD-10-CM

## 2021-02-17 LAB — BASIC METABOLIC PANEL
Anion gap: 6 (ref 5–15)
BUN: 5 mg/dL — ABNORMAL LOW (ref 6–20)
CO2: 24 mmol/L (ref 22–32)
Calcium: 8 mg/dL — ABNORMAL LOW (ref 8.9–10.3)
Chloride: 103 mmol/L (ref 98–111)
Creatinine, Ser: 0.59 mg/dL (ref 0.44–1.00)
GFR, Estimated: >60 mL/min (ref >60)
Glucose, Bld: 112 mg/dL — ABNORMAL HIGH (ref 70–99)
Potassium: 3.7 mmol/L (ref 3.5–5.1)
Sodium: 133 mmol/L — ABNORMAL LOW (ref 135–145)

## 2021-02-17 LAB — CBC
HCT: 32.6 % — ABNORMAL LOW (ref 36.0–46.0)
Hemoglobin: 11 g/dL — ABNORMAL LOW (ref 12.0–15.0)
MCH: 35.3 pg — ABNORMAL HIGH (ref 26.0–34.0)
MCHC: 33.7 g/dL (ref 30.0–36.0)
MCV: 104.5 fL — ABNORMAL HIGH (ref 80.0–100.0)
Platelets: 247 10*3/uL (ref 150–400)
RBC: 3.12 MIL/uL — ABNORMAL LOW (ref 3.87–5.11)
RDW: 12.6 % (ref 11.5–15.5)
WBC: 8.7 10*3/uL (ref 4.0–10.5)
nRBC: 0 % (ref 0.0–0.2)

## 2021-02-17 LAB — HEPARIN LEVEL (UNFRACTIONATED)
Heparin Unfractionated: 0.18 IU/mL — ABNORMAL LOW (ref 0.30–0.70)
Heparin Unfractionated: 0.25 IU/mL — ABNORMAL LOW (ref 0.30–0.70)
Heparin Unfractionated: 0.21 IU/mL — ABNORMAL LOW (ref 0.30–0.70)

## 2021-02-17 LAB — HEPATIC FUNCTION PANEL
ALT: 26 U/L (ref 0–44)
AST: 43 U/L — ABNORMAL HIGH (ref 15–41)
Albumin: 2.3 g/dL — ABNORMAL LOW (ref 3.5–5.0)
Alkaline Phosphatase: 71 U/L (ref 38–126)
Bilirubin, Direct: 0.3 mg/dL — ABNORMAL HIGH (ref 0.0–0.2)
Indirect Bilirubin: 0.7 mg/dL (ref 0.3–0.9)
Total Bilirubin: 1 mg/dL (ref 0.3–1.2)
Total Protein: 5.5 g/dL — ABNORMAL LOW (ref 6.5–8.1)

## 2021-02-17 LAB — COOXEMETRY PANEL
Carboxyhemoglobin: 1.3 % (ref 0.5–1.5)
Methemoglobin: 0.6 % (ref 0.0–1.5)
O2 Saturation: 57.4 %
Total hemoglobin: 11.2 g/dL — ABNORMAL LOW (ref 12.0–16.0)

## 2021-02-17 LAB — GLUCOSE, CAPILLARY
Glucose-Capillary: 110 mg/dL — ABNORMAL HIGH (ref 70–99)
Glucose-Capillary: 124 mg/dL — ABNORMAL HIGH (ref 70–99)
Glucose-Capillary: 131 mg/dL — ABNORMAL HIGH (ref 70–99)
Glucose-Capillary: 122 mg/dL — ABNORMAL HIGH (ref 70–99)

## 2021-02-17 LAB — MAGNESIUM: Magnesium: 1.8 mg/dL (ref 1.7–2.4)

## 2021-02-17 MED ORDER — POTASSIUM CHLORIDE CRYS ER 20 MEQ PO TBCR
40.0000 meq | EXTENDED_RELEASE_TABLET | Freq: Once | ORAL | Status: AC
  Administered 2021-02-17: 40 meq via ORAL
  Filled 2021-02-17: qty 2

## 2021-02-17 MED ORDER — MAGNESIUM OXIDE -MG SUPPLEMENT 400 (240 MG) MG PO TABS
400.0000 mg | ORAL_TABLET | Freq: Two times a day (BID) | ORAL | Status: DC
  Administered 2021-02-17 (×2): 400 mg via ORAL
  Filled 2021-02-17 (×2): qty 1

## 2021-02-17 NOTE — Progress Notes (Signed)
ANTICOAGULATION CONSULT NOTE   Pharmacy Consult for IV heparin Indication: apical mural thrombus  Allergies  Allergen Reactions   Ibuprofen Anaphylaxis   Ondansetron Hcl Other (See Comments)    Led to QT prolongation and cardiac arrest   Chlorhexidine Other (See Comments)   Penicillins Nausea And Vomiting   Effexor [Venlafaxine] Palpitations and Other (See Comments)    Panic attacks   Latex Rash    Tape irritates the skin/ please use paper tape   Tape Rash    pls use paper tape    Patient Measurements: Height: 5' 2"  (157.5 cm) Weight: 55.4 kg (122 lb 3.2 oz) IBW/kg (Calculated) : 50.1 Heparin Dosing Weight: 58.7 kg  Vital Signs: Temp: 98.8 F (37.1 C) (09/10 0800) Temp Source: Oral (09/10 0800) BP: 103/80 (09/10 0800) Pulse Rate: 70 (09/10 0800)  Labs: Recent Labs    02/14/21 1841 02/14/21 2307 02/14/21 2307 02/15/21 0433 02/15/21 1254 02/15/21 1647 02/16/21 0440 02/16/21 0450 02/16/21 1050 02/16/21 2000 02/17/21 0500 02/17/21 1300  HGB  --   --   --  9.5*   < > 10.9* 9.6*  --   --   --  11.0*  --   HCT  --   --   --  28.8*   < > 32.0* 29.1*  --   --   --  32.6*  --   PLT  --   --   --  185  --   --  149*  --   --   --  247  --   LABPROT  --   --   --   --   --   --   --   --  14.6  --   --   --   INR  --   --   --   --   --   --   --   --  1.1  --   --   --   HEPARINUNFRC  --   --   --  0.17*   < >  --   --    < > 0.10* 0.23* 0.18* 0.25*  CREATININE  --  0.48   < > 0.44  --   --  0.46  --   --   --  0.59  --   TROPONINIHS 50* 45*  --   --   --   --   --   --   --   --   --   --    < > = values in this interval not displayed.     Estimated Creatinine Clearance: 74.7 mL/min (by C-G formula based on SCr of 0.59 mg/dL).   Medical History: Past Medical History:  Diagnosis Date   AKI (acute kidney injury) ([place]) 06/15/2019   admitted with delhydration r/o arf   Alcohol abuse    Alcoholic hepatitis without ascites    Depression    Family history of  adverse reaction to anesthesia    MOTHER HAD PANIC ATTACKS & NAUSEA  AFTER   Gastritis    GIB (gastrointestinal bleeding)    Hypertension    Iron deficiency anemia    Pancreatitis    PONV (postoperative nausea and vomiting)     Medications:  Infusions:   sodium chloride 10 mL/hr at 02/16/21 1900   cefTRIAXone (ROCEPHIN)  IV Stopped (02/16/21 1829)   heparin 1,800 Units/hr (02/17/21 1352)   levETIRAcetam 1,000 mg (02/17/21 1158)    Assessment: 39  yo female admitted after cardiac arrest.  Pharmacy asked to start IV heparin for apical mural thrombus seen on ECHO.  Not taking anticoagulants PTA.  Heparin level 0.25 today on 1800 units/hr. Nurse reports that the pump was beeping and off at some point late this morning/afternoon, but she is unsure how long it was off or when it was turned back on. Due to this, unsure of how to interpret current heparin level. Will recheck level tonight.  No bleeding issues noted. Hemoglobin stable overnight up to 11 this am. Plt count up to 247.  Goal of Therapy:  Heparin level 0.3-0.7 units/ml Monitor platelets by anticoagulation protocol: Yes   Plan:  Continue heparin 1800 units/hr Check repeat level tonight at 1800 Follow up starting po anticoagulation if no procedures are planned Daily heparin level and CBC.  Thank you for including pharmacy in the care of this patient.  Zenaida Deed, PharmD PGY1 Acute Care Pharmacy Resident  Phone: [phone] 02/17/2021  3:33 PM  Please check [email or web address] for unit-specific pharmacy phone numbers.

## 2021-02-17 NOTE — Progress Notes (Signed)
Patient Name: Susan [name]   Patient Profile:    Susan [name] is a 40 y.o. female with a hx of protracted ETOH use, alcoholic pancreatitis, and depression,  who is being seen 02/16/2021 for the evaluation of CM/cardiac arrest/long QT at the request of Dr. Haroldine Laws Adimitted with OOH cardiac arrest, ETOH >100 K 3.0>>2.4   R sided pneumonia  In hospital recurrent VT PM without pause dependency but QT very long ( following Zofran)  EF 30%  Depression treated with Prozac now on hold   Date HR   QTc ([place] correction used for heart rates faster than 100) K  12/17 113 486   10/18 106 467   11/19 78 517   1/21 113 395 3.0  9/21 110 416 3.6  02/13/21 132 520 2.4  02/15/21 73 600 3.9  02/16/21 84 527 3.8   EF 25-30% global hypokinesis  cMRI EF 35% with neg LGE Patient Profile:    Susan [name] is a 40 y.o. female with a hx of ETOH use, alcoholic pancreatitis, and depression,  who is being seen 02/16/2021 for the evaluation of CM/cardiac arrest/long QT at the request of Dr. Hayden Pedro     SUBJECTIVE: Feeling better this morning.  Some chest discomfort.  Lengthy discussion trying to help her understand 1-that she had a cardiac arrest, 2-the precarious impact of alcohol on her life 3-gift of life that she has with her 19-year-old son, #4-question of QT prolongation as an underlying condition suggested but not clear.  Clearly drug-induced QT prolongation, 5-LGE negative which portends a better prognosis in terms of recovery of her heart muscle   Past Medical History:  Diagnosis Date   AKI (acute kidney injury) ([place]) 06/15/2019   admitted with delhydration r/o arf   Alcohol abuse    Alcoholic hepatitis without ascites    Depression    Family history of adverse reaction to anesthesia    MOTHER HAD PANIC ATTACKS & NAUSEA  AFTER   Gastritis    GIB (gastrointestinal bleeding)    Hypertension    Iron deficiency anemia    Pancreatitis    PONV (postoperative nausea and  vomiting)     Scheduled Meds:  Scheduled Meds:  folic acid  1 mg Oral Daily   insulin aspart  0-5 Units Subcutaneous QHS   insulin aspart  0-9 Units Subcutaneous TID WC   multivitamin with minerals  1 tablet Oral Daily   nadolol  20 mg Oral BID   polyethylene glycol  17 g Oral Daily   sacubitril-valsartan  1 tablet Oral BID   sodium chloride flush  10-40 mL Intracatheter Q12H   sodium chloride flush  3 mL Intravenous Q12H   spironolactone  12.5 mg Oral Daily   thiamine injection  100 mg Intravenous Daily   Continuous Infusions:  sodium chloride 10 mL/hr at 02/16/21 1900   cefTRIAXone (ROCEPHIN)  IV Stopped (02/16/21 1829)   heparin 1,650 Units/hr (02/16/21 2143)   levETIRAcetam 1,000 mg (02/17/21 0015)   sodium chloride, acetaminophen, guaiFENesin-dextromethorphan, LORazepam, menthol-cetylpyridinium, sodium chloride flush, sodium chloride flush    PHYSICAL EXAM Vitals:   02/16/21 2150 02/17/21 0000 02/17/21 0010 02/17/21 0504  BP: (!) 105/59 108/88  99/70  Pulse: 68 68  69  Resp: 15 (!) 24  20  Temp: 98.5 F (36.9 C) 98.3 F (36.8 C)  98.3 F (36.8 C)  TempSrc: Oral Oral  Oral  SpO2: 92% 99%  94%  Weight:  55.4 kg   Height:          TELEMETRY: Reviewed personnally pt in no interval ventricular tachycardia Strips reviewed, polymorphic VT was not pause dependent.  QT prolongation was variable.  Most impressive following the Zofran:  ECG personally reviewed. (As above)    Intake/Output Summary (Last 24 hours) at 02/17/2021 0749 Last data filed at 02/16/2021 1900 Gross per 24 hour  Intake 783.74 ml  Output --  Net 783.74 ml    LABS: Basic Metabolic Panel: Recent Labs  Lab 02/14/21 0325 02/14/21 1203 02/14/21 1340 02/14/21 2307 02/15/21 0433 02/15/21 1636 02/15/21 1644 02/15/21 1647 02/16/21 0440 02/17/21 0500  NA 135 126* 135 132* 130* 140 139 135 132* 133*  K 3.8 3.1* 3.6 4.3 3.9 3.5 3.7 4.1 3.8 3.7  CL 103 93* 105 100 96*  --   --   --  102 103   CO2 23 19* 20* 22 22  --   --   --  23 24  GLUCOSE 130* 580* 138* 181* 258*  --   --   --  127* 112*  BUN 7 6 6  <5* <5*  --   --   --  <5* 5*  CREATININE 0.61 0.63 0.71 0.48 0.44  --   --   --  0.46 0.59  CALCIUM 7.9* 7.3* 7.9* 7.7* 8.0*  --   --   --  7.9* 8.0*  MG 1.9 2.4 3.1* 1.9 1.9  --   --   --  1.9 1.8  PHOS 3.4 2.9 2.4* 4.2  --   --   --   --  3.6  --    Cardiac Enzymes: No results for input(s): CKTOTAL, CKMB, CKMBINDEX, TROPONINI in the last 72 hours. CBC: Recent Labs  Lab 02/13/21 1651 02/13/21 1657 02/14/21 0325 02/14/21 1203 02/15/21 0433 02/15/21 1636 02/15/21 1644 02/15/21 1647 02/16/21 0440 02/17/21 0500  WBC 8.1  --  8.7 8.8 9.6  --   --   --  7.9 8.7  NEUTROABS  --   --   --  7.9*  --   --   --   --   --   --   HGB 13.3   < > 12.2 10.3* 9.5* 10.2* 9.5* 10.9* 9.6* 11.0*  HCT 41.5   < > 35.2* 32.0* 28.8* 30.0* 28.0* 32.0* 29.1* 32.6*  MCV 109.5*  --  103.2* 107.7* 107.5*  --   --   --  106.2* 104.5*  PLT 240  --  230 222 185  --   --   --  149* 247   < > = values in this interval not displayed.   PROTIME: Recent Labs    02/16/21 1050  LABPROT 14.6  INR 1.1   Liver Function Tests: Recent Labs    02/15/21 0433 02/17/21 0500  AST 87* 43*  ALT 39 26  ALKPHOS 71 71  BILITOT 0.8 1.0  PROT 5.9* 5.5*  ALBUMIN 2.6* 2.3*        ASSESSMENT AND PLAN:  OOH Cardiac arrest  with recurrent VT-PM  with QT prolongation but no pause dependency  COVID   Drug-induced QT prolongation  Hypokalemia on arrival   Alcoholism with recurrent sequealae pancreatitis hepatitis  Depression   Cardiomyopathy non ischemic - LGE    No clear inflammation on cMRI  to suggest COVID myocarditis as a possible trigger for her cardiac arrest.  Her potassiums were low but it is hard to interpret these in the  context of cardiac arrest and resuscitation.  Unfortunately, or perhaps fortunately, on 9/8 her QTC was over 600 with low potassium suggesting that the potassium is not  the culprit.  In the context of the Zofran it suggest that she has at least drug-induced QT prolongation  Will discuss with colleagues but favor ICD for secondary prevention  Lengthy discussion also regarding her alcoholism and the need for rehab and accountability.  Relevant to her 46-year-old son  Continue nadolol as the preferred beta-blocker with QT prolongation.  Time spent 57 min   Signed, Virl Axe MD  02/17/2021

## 2021-02-17 NOTE — Plan of Care (Signed)
MRI brain shows no evidence of acute intracranial abnormality per the Radiology report. On review of the images by neurology, there is mild diffuse cerebral atrophy; otherwise, no abnormalities are seen.   Neurohospitalist service will sign off. Please call if there are additional questions.   Electronically signed: Dr. Kerney Elbe

## 2021-02-17 NOTE — Progress Notes (Signed)
ANTICOAGULATION CONSULT NOTE   Pharmacy Consult for IV heparin Indication: apical mural thrombus  Allergies  Allergen Reactions   Ibuprofen Anaphylaxis   Ondansetron Hcl Other (See Comments)    Led to QT prolongation and cardiac arrest   Chlorhexidine Other (See Comments)   Penicillins Nausea And Vomiting   Effexor [Venlafaxine] Palpitations and Other (See Comments)    Panic attacks   Latex Rash    Tape irritates the skin/ please use paper tape   Tape Rash    pls use paper tape    Patient Measurements: Height: 5' 2"  (157.5 cm) Weight: 55.4 kg (122 lb 3.2 oz) IBW/kg (Calculated) : 50.1 Heparin Dosing Weight: 58.7 kg  Vital Signs: Temp: 99 F (37.2 C) (09/10 1641) Temp Source: Oral (09/10 1641) BP: 103/79 (09/10 1400) Pulse Rate: 70 (09/10 1400)  Labs: Recent Labs    02/14/21 2307 02/15/21 0433 02/15/21 1254 02/15/21 1647 02/16/21 0440 02/16/21 0450 02/16/21 1050 02/16/21 2000 02/17/21 0500 02/17/21 1300 02/17/21 1812  HGB  --  9.5*   < > 10.9* 9.6*  --   --   --  11.0*  --   --   HCT  --  28.8*   < > 32.0* 29.1*  --   --   --  32.6*  --   --   PLT  --  185  --   --  149*  --   --   --  247  --   --   LABPROT  --   --   --   --   --   --  14.6  --   --   --   --   INR  --   --   --   --   --   --  1.1  --   --   --   --   HEPARINUNFRC  --  0.17*   < >  --   --    < > 0.10*   < > 0.18* 0.25* 0.21*  CREATININE 0.48 0.44  --   --  0.46  --   --   --  0.59  --   --   TROPONINIHS 45*  --   --   --   --   --   --   --   --   --   --    < > = values in this interval not displayed.     Estimated Creatinine Clearance: 74.7 mL/min (by C-G formula based on SCr of 0.59 mg/dL).   Medical History: Past Medical History:  Diagnosis Date   AKI (acute kidney injury) ([place]) 06/15/2019   admitted with delhydration r/o arf   Alcohol abuse    Alcoholic hepatitis without ascites    Depression    Family history of adverse reaction to anesthesia    MOTHER HAD PANIC ATTACKS  & NAUSEA  AFTER   Gastritis    GIB (gastrointestinal bleeding)    Hypertension    Iron deficiency anemia    Pancreatitis    PONV (postoperative nausea and vomiting)     Medications:  Infusions:   sodium chloride 10 mL/hr at 02/16/21 1900   heparin 1,800 Units/hr (02/17/21 1548)   levETIRAcetam 1,000 mg (02/17/21 1158)    Assessment: 40 yo female admitted after cardiac arrest.  Pharmacy asked to start IV heparin for apical mural thrombus seen on ECHO.  Not taking anticoagulants PTA.  Heparin level 0.25 today on 1800 units/hr.  Nurse reports that the pump was beeping and off at some point late this morning/afternoon, but she is unsure how long it was off or when it was turned back on. Due to this, unsure of how to interpret current heparin level. Will recheck level tonight.  No bleeding issues noted. Hemoglobin stable overnight up to 11 this am. Plt count up to 247.  Goal of Therapy:  Heparin level 0.3-0.7 units/ml Monitor platelets by anticoagulation protocol: Yes   Plan:  Increase IV heparin to 1900 units/hr. Repeat heparin level with AM labs. Follow up starting po anticoagulation if no procedures are planned Daily heparin level and CBC.  Thank you for including pharmacy in the care of this patient.  [place] Crane, Roylene Reason, BCCP Clinical Pharmacist  02/17/2021 8:04 PM   [facility] pharmacy phone numbers are listed on [email or web address]

## 2021-02-17 NOTE — Progress Notes (Signed)
PROGRESS NOTE  Susan [name] JHE:[id number] DOB: 1981-06-01 DOA: 02/13/2021 PCP: Cathlean Sauer, MD   LOS: 4 days   Brief narrative: 40 year old female past medical history of alcohol abuse, alcoholic hepatitis, history of GI bleed, hypertension was admitted hospital in the past for alcoholic ketoacidosis, pancreatitis.  Was also treated in the past for major depressive disorder.  Patient was brought into the hospital this time with witnessed seizure-like activity.  Patient had seizure-like activity which progressed to cardiac arrest.  Initial rhythm was ventricular fibrillation and patient was defibrillated.  She underwent 10 minutes of CPR with return of circulation.  Patient was then transferred to hospital and was intubated on arrival.  Patient was then admitted to hospital for further evaluation and treatment.  Assessment/Plan:  Principal Problem:   Cardiac arrest [facility]) Active Problems:   Alcohol abuse   Depression   Chronic hypertension   Cardiac arrest- x2, Cardiogenic Shock-Improving Acute systolic heart failure with prolonged QT and nonischemic cardiomyopathy.  Patient initially had ventricular fibrillation with cardiac arrest.  CTA chest and head was unremarkable.  Patient had polymorphic VT after extubation on 9/7 and after receiving IV Zofran.  2D echocardiogram showed LV ejection fraction of 30 to 35% with regional wall motion abnormality.  Cardiac catheterization was done with normal coronaries.  Patient had received Levophed which has been discontinued as time.  Cardiac MRI showed LV dysfunction at 37% with the basal and apical wall thickening.  She will likely need LifeVest at discharge. Continue heparin drip.  Continue Entresto, nadolol, continue to replenish electrolytes.  Cardiology on board.  COVID 19 with Acute hypoxemic respiratory failure secondary to Aspiration PNA Aspiration pneumonia secondary to cardiac arrest.  Extubated on 9/7.  On nasal cannula oxygen.  No  leukocytosis.  No fever at this time.  On IV Rocephin day 4/5.  No growth on cultures so far.  Seizure: suspect secondary to arhythmia  MRI of the brain reviewed showed no acute intracranial abnormality but diffuse cerebral atrophy.  Patient is on Laie.  We will continue.  No further recommendations given   Elevated LFT.  Likely secondary to cardiac arrest and shock liver, has significantly improved at this time   Diarrhea/Vomiting Improved.  Right upper quadrant ultrasound showed echogenic liver.  Avoid Zofran due to prolonged QT.     Macrocytic anemia. Continue multivitamin folate.  Hypokalemia- Improved, will replenish to keep potassium more than 4.  Hypomagnesemia. Will continue to replenish to keep magnesium more than 2.  Hyponatremia Mild.  We will continue to monitor.    DM type II: Likely gestational diabetes.  Following pregnancy 13 months prior.  Latest hemoglobin A1C 5.2.  Continue sliding insulin, Accu-Cheks, diabetic diet  Alcohol abuse   ETOH 104 on admission -Continue thiamine, folate, MVI, continue CIWA protocol.    DVT prophylaxis: SCDs Start: 02/13/21 1725   Code Status: Full code  Family Communication: Full code  Status is: Inpatient  Remains inpatient appropriate because:IV treatments appropriate due to intensity of illness or inability to take PO and Inpatient level of care appropriate due to severity of illness  Dispo: The patient is from: Home              Anticipated d/c is to: Home              Patient currently is not medically stable to d/c.   Difficult to place patient No   Consultants: PCCM Cardiology  Procedures: MRI of the brain  Anti-infectives:  Rocephin  Anti-infectives (From admission, onward)    Start     Dose/Rate Route Frequency Ordered Stop   02/14/21 2200  metroNIDAZOLE (FLAGYL) tablet 500 mg  Status:  Discontinued        500 mg Per Tube Every 12 hours 02/14/21 0824 02/14/21 1200   02/13/21 2200  metroNIDAZOLE  (FLAGYL) tablet 500 mg  Status:  Discontinued        500 mg Per Tube Every 8 hours 02/13/21 1754 02/14/21 0824   02/13/21 1800  cefTRIAXone (ROCEPHIN) 2 g in sodium chloride 0.9 % 100 mL IVPB        2 g 200 mL/hr over 30 Minutes Intravenous Every 24 hours 02/13/21 1754 02/18/21 1759      Subjective: Today, patient was seen and examined at bedside.  Patient wishes to discontinue her left neck IV line.  Has mild chest discomfort but denies any shortness of breath, dizziness, lightheadedness.  Objective: Vitals:   02/17/21 0504 02/17/21 0800  BP: 99/70 103/80  Pulse: 69 70  Resp: 20 20  Temp: 98.3 F (36.8 C) 98.8 F (37.1 C)  SpO2: 94% 97%    Intake/Output Summary (Last 24 hours) at 02/17/2021 1123 Last data filed at 02/16/2021 1900 Gross per 24 hour  Intake 519.42 ml  Output --  Net 519.42 ml   Filed Weights   02/15/21 0500 02/16/21 0500 02/17/21 0010  Weight: 57.9 kg 56.3 kg 55.4 kg   Body mass index is 22.35 kg/m.   Physical Exam: GENERAL: Patient is alert awake and oriented. Not in obvious distress. HENT: No scleral pallor or icterus. Pupils equally reactive to light. Oral mucosa is moist NECK: is supple, no gross swelling noted. CHEST: Clear to auscultation. No crackles or wheezes.  Diminished breath sounds bilaterally. CVS: S1 and S2 heard, no murmur. Regular rate and rhythm.  ABDOMEN: Soft, non-tender, bowel sounds are present. EXTREMITIES: No edema. CNS: Cranial nerves are intact. No focal motor deficits. SKIN: warm and dry without rashes.  Data Review: I have personally reviewed the following laboratory data and studies,  CBC: Recent Labs  Lab 02/14/21 0325 02/14/21 1203 02/15/21 0433 02/15/21 1636 02/15/21 1644 02/15/21 1647 02/16/21 0440 02/17/21 0500  WBC 8.7 8.8 9.6  --   --   --  7.9 8.7  NEUTROABS  --  7.9*  --   --   --   --   --   --   HGB 12.2 10.3* 9.5* 10.2* 9.5* 10.9* 9.6* 11.0*  HCT 35.2* 32.0* 28.8* 30.0* 28.0* 32.0* 29.1* 32.6*  MCV  103.2* 107.7* 107.5*  --   --   --  106.2* 104.5*  PLT 230 222 185  --   --   --  149* 836   Basic Metabolic Panel: Recent Labs  Lab 02/14/21 0325 02/14/21 1203 02/14/21 1340 02/14/21 2307 02/15/21 0433 02/15/21 1636 02/15/21 1644 02/15/21 1647 02/16/21 0440 02/17/21 0500  NA 135 126* 135 132* 130* 140 139 135 132* 133*  K 3.8 3.1* 3.6 4.3 3.9 3.5 3.7 4.1 3.8 3.7  CL 103 93* 105 100 96*  --   --   --  102 103  CO2 23 19* 20* 22 22  --   --   --  23 24  GLUCOSE 130* 580* 138* 181* 258*  --   --   --  127* 112*  BUN 7 6 6  <5* <5*  --   --   --  <5* 5*  CREATININE 0.61 0.63 0.71 0.48 0.44  --   --   --  0.46 0.59  CALCIUM 7.9* 7.3* 7.9* 7.7* 8.0*  --   --   --  7.9* 8.0*  MG 1.9 2.4 3.1* 1.9 1.9  --   --   --  1.9 1.8  PHOS 3.4 2.9 2.4* 4.2  --   --   --   --  3.6  --    Liver Function Tests: Recent Labs  Lab 02/13/21 1651 02/14/21 1203 02/14/21 1340 02/15/21 0433 02/17/21 0500  AST 307* 153* 170* 87* 43*  ALT 58* 46* 52* 39 26  ALKPHOS 92 73 82 71 71  BILITOT 0.8 1.3* 1.0 0.8 1.0  PROT 6.3* 5.8* 6.3* 5.9* 5.5*  ALBUMIN 3.0* 2.6* 2.9* 2.6* 2.3*   Recent Labs  Lab 02/13/21 2031  LIPASE 20   Recent Labs  Lab 02/13/21 1758  AMMONIA 59*   Cardiac Enzymes: No results for input(s): CKTOTAL, CKMB, CKMBINDEX, TROPONINI in the last 168 hours. BNP (last 3 results) No results for input(s): BNP in the last 8760 hours.  ProBNP (last 3 results) No results for input(s): PROBNP in the last 8760 hours.  CBG: Recent Labs  Lab 02/16/21 1347 02/16/21 1538 02/16/21 1959 02/16/21 2142 02/17/21 0540  GLUCAP 105* 111* 141* 121* 110*   Recent Results (from the past 240 hour(s))  Resp Panel by RT-PCR (Flu A&B, Covid) Nasopharyngeal Swab     Status: Abnormal   Collection Time: 02/13/21  5:02 PM   Specimen: Nasopharyngeal Swab; Nasopharyngeal(NP) swabs in vial transport medium  Result Value Ref Range Status   SARS Coronavirus 2 by RT PCR POSITIVE (A) NEGATIVE Final     Comment: RESULT CALLED TO, READ BACK BY AND VERIFIED WITH: MEGAN RALL RN 02/13/21 @1914  BY JW (NOTE) SARS-CoV-2 target nucleic acids are DETECTED.  The SARS-CoV-2 RNA is generally detectable in upper respiratory specimens during the acute phase of infection. Positive results are indicative of the presence of the identified virus, but do not rule out bacterial infection or co-infection with other pathogens not detected by the test. Clinical correlation with patient history and other diagnostic information is necessary to determine patient infection status. The expected result is Negative.  Fact Sheet for Patients: EntrepreneurPulse.com.au  Fact Sheet for Healthcare Providers: IncredibleEmployment.be  This test is not yet approved or cleared by the [place] FDA and  has been authorized for detection and/or diagnosis of SARS-CoV-2 by FDA under an Emergency Use Authorization (EUA).  This EUA will remain in effect (meaning this test can be  used) for the duration of  the COVID-19 declaration under Section 564(b)(1) of the Act, 21 U.S.C. section 360bbb-3(b)(1), unless the authorization is terminated or revoked sooner.     Influenza A by PCR NEGATIVE NEGATIVE Final   Influenza B by PCR NEGATIVE NEGATIVE Final    Comment: (NOTE) The Xpert Xpress SARS-CoV-2/FLU/RSV plus assay is intended as an aid in the diagnosis of influenza from Nasopharyngeal swab specimens and should not be used as a sole basis for treatment. Nasal washings and aspirates are unacceptable for Xpert Xpress SARS-CoV-2/FLU/RSV testing.  Fact Sheet for Patients: EntrepreneurPulse.com.au  Fact Sheet for Healthcare Providers: IncredibleEmployment.be  This test is not yet approved or cleared by the [place] FDA and has been authorized for detection and/or diagnosis of SARS-CoV-2 by FDA under an Emergency Use Authorization (EUA). This EUA  will remain in effect (meaning this test can be used) for the duration of the COVID-19 declaration under Section 564(b)(1) of the Act, 21 U.S.C. section 360bbb-3(b)(1), unless the authorization is  terminated or revoked.  Performed at [facility], [place] [address], [place], [place] [address]   Culture, blood (routine x 2)     Status: None (Preliminary result)   Collection Time: 02/13/21  5:59 PM   Specimen: BLOOD  Result Value Ref Range Status   Specimen Description BLOOD LEFT ANTECUBITAL  Final   Special Requests   Final    BOTTLES DRAWN AEROBIC AND ANAEROBIC Blood Culture adequate volume   Culture   Final    NO GROWTH 3 DAYS Performed at [facility], [place] [address] [place], [place] [address]    Report Status PENDING  Incomplete  Culture, blood (routine x 2)     Status: None (Preliminary result)   Collection Time: 02/13/21  8:31 PM   Specimen: BLOOD LEFT HAND  Result Value Ref Range Status   Specimen Description BLOOD LEFT HAND  Final   Special Requests   Final    BOTTLES DRAWN AEROBIC AND ANAEROBIC Blood Culture adequate volume   Culture   Final    NO GROWTH 3 DAYS Performed at [facility], [place] [address] [place], [place] [address]    Report Status PENDING  Incomplete     Studies: MR BRAIN W WO CONTRAST  Result Date: 02/16/2021 CLINICAL DATA:  Seizure, abnormal neuro exam EXAM: MRI HEAD WITHOUT AND WITH CONTRAST TECHNIQUE: Multiplanar, multiecho pulse sequences of the brain and surrounding structures were obtained without and with intravenous contrast. CONTRAST:  88m GADAVIST GADOBUTROL 1 MMOL/ML IV SOLN COMPARISON:  CT exams 02/13/2021. FINDINGS: Motion limited study.  Within this limitation: Brain: No acute infarction, hemorrhage, hydrocephalus, extra-axial collection or mass lesion. No abnormal enhancement. Vascular: Major arterial flow voids are maintained at the skull base. Skull and upper cervical spine: Normal marrow signal. Sinuses/Orbits: Mild  ethmoid, maxillary and sphenoid sinus mucosal thickening. Unremarkable orbits. Other: No mastoid effusions. IMPRESSION: No evidence of acute intracranial abnormality on this motion limited exam. Electronically Signed   By: FMargaretha SheffieldM.D.   On: 02/16/2021 16:50   CARDIAC CATHETERIZATION  Result Date: 02/15/2021 Findings: On norepinephrine 2 Ao = 112/78 (94) LV = 111/22 RA =  3 RV = 26/5 PA = 25/12 (19) PCW = 12 Fick cardiac output/index = 3.6/2.3 PVR = 1.9 SVR = 2,011 Ao sat = 95% PA sat = 58%, 60% Assessment: Normal coronaries Nonischemic CM with EF 30-35% by echo Moderately depressed cardiac output on low-dose NE Plan/Discussion: No evidence of ischemic substrate in epicardial coronary arteries. Will need cMRI and EP evaluation for possible ICD. Wean NE. Start GDMT. DGlori Bickers MD 5:42 PM  MR CARDIAC MORPHOLOGY W WO CONTRAST  Result Date: 02/16/2021 CLINICAL DATA:  Cardiomyopathy of uncertain etiology EXAM: CARDIAC MRI TECHNIQUE: The patient was scanned on a 1.5 Tesla GE magnet. A dedicated cardiac coil was used. Functional imaging was done using Fiesta sequences. 2,3, and 4 chamber views were done to assess for RWMA's. Modified Simpson's rule using a short axis stack was used to calculate an ejection fraction on a dedicated work sConservation officer, nature The patient received 8 cc of Gadavist. After 10 minutes inversion recovery sequences were used to assess for infiltration and scar tissue. FINDINGS: Bilateral airspace disease, upper lobe predominant and more pronounced right upper lobe. Small L>R bilateral pleural effusions. Normal left ventricular size and wall thickness. The basal segments and the apex appeared to contract normally. The mid-wall segments (anterior, anteroseptal, inferoseptal, inferior, inferolateral but less so the mid anterolateral wall)  appeared severely hypokinetic to akinetic. Moderate systolic dysfunction, LV EF 37%. Normal right ventricular size and mildly  decreased systolic function, EF 83%. Mild left atrial enlargement. Normal right atrium. Trileaflet aortic valve, no significant stenosis. No significant mitral regurgitation noted. On delayed enhancement imaging, there was no myocardial late gadolinium enhancement (LGE). Measurements: LVEDV 149 mL LVSV 54 mL LVEF 37% RVEDV 98 mL RVSV 35 mL RVEF 36% T2 signal 50 basal septal wall, 57 mid septal wall ECV 35% IMPRESSION: 1.  Airspace disease, upper lobe predominant. 2. Moderate LV systolic dysfunction with EF 37%. The basal and apical wall thickening was relatively normal, the mid ventricular segments were severely hypokinetic to akinetic. 3. Normal RV size with mild to moderately decreased systolic function, RV EF 41%. 4. Mildly elevated extracellular volume, 35%. There was mildly elevated T2 signal in the mid septal wall. 5.  No myocardial late gadolinium enhancement. The pattern of LV dysfunction could be a mid-wall variant of Takotsubo cardiomyopathy. T2 and T1 signal mildly increased, T2 increased in the mid septal wall. Dalton Mclean Electronically Signed   By: Loralie Champagne M.D.   On: 02/16/2021 16:40   US Abdomen Limited RUQ (LIVER/GB)  Result Date: 02/16/2021 CLINICAL DATA:  Transaminitis EXAM: ULTRASOUND ABDOMEN LIMITED RIGHT UPPER QUADRANT COMPARISON:  CT 10/28/2019 FINDINGS: Gallbladder: No gallstones or wall thickening visualized. No sonographic Murphy sign noted by sonographer. Common bile duct: Diameter: 5 mm Liver: Diffusely echogenic. No focal hepatic abnormality portal vein is patent on color Doppler imaging with normal direction of blood flow towards the liver. Other: None. IMPRESSION: Diffusely echogenic liver consistent with hepatic steatosis and or hepatocellular disease. Otherwise negative right upper quadrant abdominal ultrasound. Electronically Signed   By: Donavan Foil M.D.   On: 02/16/2021 16:57      Flora Lipps, MD  [facility] 02/17/2021  If 7PM-7AM, please contact  night-coverage

## 2021-02-17 NOTE — Progress Notes (Signed)
ANTICOAGULATION CONSULT NOTE   Pharmacy Consult for IV heparin Indication: apical mural thrombus  Allergies  Allergen Reactions   Ibuprofen Anaphylaxis   Ondansetron Hcl Other (See Comments)    Led to QT prolongation and cardiac arrest   Chlorhexidine Other (See Comments)   Penicillins Nausea And Vomiting   Effexor [Venlafaxine] Palpitations and Other (See Comments)    Panic attacks   Latex Rash    Tape irritates the skin/ please use paper tape   Tape Rash    pls use paper tape    Patient Measurements: Height: 5' 2"  (157.5 cm) Weight: 55.4 kg (122 lb 3.2 oz) IBW/kg (Calculated) : 50.1 Heparin Dosing Weight: 58.7 kg  Vital Signs: Temp: 98.3 F (36.8 C) (09/10 0504) Temp Source: Oral (09/10 0504) BP: 99/70 (09/10 0504) Pulse Rate: 69 (09/10 0504)  Labs: Recent Labs    02/14/21 1841 02/14/21 2307 02/15/21 0433 02/15/21 1254 02/15/21 1647 02/16/21 0440 02/16/21 0450 02/16/21 1050 02/16/21 2000 02/17/21 0500  HGB  --   --  9.5*   < > 10.9* 9.6*  --   --   --  11.0*  HCT  --   --  28.8*   < > 32.0* 29.1*  --   --   --  32.6*  PLT  --   --  185  --   --  149*  --   --   --  247  LABPROT  --   --   --   --   --   --   --  14.6  --   --   INR  --   --   --   --   --   --   --  1.1  --   --   HEPARINUNFRC  --   --  0.17*   < >  --   --    < > 0.10* 0.23* 0.18*  CREATININE  --  0.48 0.44  --   --  0.46  --   --   --  0.59  TROPONINIHS 50* 45*  --   --   --   --   --   --   --   --    < > = values in this interval not displayed.     Estimated Creatinine Clearance: 74.7 mL/min (by C-G formula based on SCr of 0.59 mg/dL).   Medical History: Past Medical History:  Diagnosis Date   AKI (acute kidney injury) ([place]) 06/15/2019   admitted with delhydration r/o arf   Alcohol abuse    Alcoholic hepatitis without ascites    Depression    Family history of adverse reaction to anesthesia    MOTHER HAD PANIC ATTACKS & NAUSEA  AFTER   Gastritis    GIB (gastrointestinal  bleeding)    Hypertension    Iron deficiency anemia    Pancreatitis    PONV (postoperative nausea and vomiting)     Medications:  Infusions:   sodium chloride 10 mL/hr at 02/16/21 1900   cefTRIAXone (ROCEPHIN)  IV Stopped (02/16/21 1829)   heparin 1,650 Units/hr (02/16/21 2143)   levETIRAcetam 1,000 mg (02/17/21 0015)    Assessment: 40 yo female admitted after cardiac arrest.  Pharmacy asked to start IV heparin for apical mural thrombus seen on ECHO.  Not taking anticoagulants PTA.  Heparin level 0.18 today on 1650 units/hr. No bleeding issues noted. Hemoglobin stable overnight up to 11 this am. Plt count up to 247.  Goal of Therapy:  Heparin level 0.3-0.7 units/ml Monitor platelets by anticoagulation protocol: Yes   Plan:  Increase heparin gtt to 1800 units/hr. HL in 6 hours Follow up starting po anticoagulation if no procedures are planned.  Daily heparin level and CBC.  Erin Hearing PharmD., BCPS Clinical Pharmacist 02/17/2021 6:23 AM

## 2021-02-18 DIAGNOSIS — I469 Cardiac arrest, cause unspecified: Secondary | ICD-10-CM | POA: Diagnosis not present

## 2021-02-18 LAB — CULTURE, BLOOD (ROUTINE X 2)
Culture: NO GROWTH
Culture: NO GROWTH
Special Requests: ADEQUATE
Special Requests: ADEQUATE

## 2021-02-18 LAB — CBC
HCT: 29.2 % — ABNORMAL LOW (ref 36.0–46.0)
Hemoglobin: 9.9 g/dL — ABNORMAL LOW (ref 12.0–15.0)
MCH: 36 pg — ABNORMAL HIGH (ref 26.0–34.0)
MCHC: 33.9 g/dL (ref 30.0–36.0)
MCV: 106.2 fL — ABNORMAL HIGH (ref 80.0–100.0)
Platelets: 275 10*3/uL (ref 150–400)
RBC: 2.75 MIL/uL — ABNORMAL LOW (ref 3.87–5.11)
RDW: 12.7 % (ref 11.5–15.5)
WBC: 8.5 10*3/uL (ref 4.0–10.5)
nRBC: 0 % (ref 0.0–0.2)

## 2021-02-18 LAB — COOXEMETRY PANEL
Carboxyhemoglobin: 1.3 % (ref 0.5–1.5)
Methemoglobin: 0.7 % (ref 0.0–1.5)
O2 Saturation: 66.5 %
Total hemoglobin: 9.9 g/dL — ABNORMAL LOW (ref 12.0–16.0)

## 2021-02-18 LAB — BASIC METABOLIC PANEL
Anion gap: 5 (ref 5–15)
BUN: <5 mg/dL — ABNORMAL LOW (ref 6–20)
CO2: 23 mmol/L (ref 22–32)
Calcium: 8.2 mg/dL — ABNORMAL LOW (ref 8.9–10.3)
Chloride: 107 mmol/L (ref 98–111)
Creatinine, Ser: 0.5 mg/dL (ref 0.44–1.00)
GFR, Estimated: >60 mL/min (ref >60)
Glucose, Bld: 109 mg/dL — ABNORMAL HIGH (ref 70–99)
Potassium: 3.9 mmol/L (ref 3.5–5.1)
Sodium: 135 mmol/L (ref 135–145)

## 2021-02-18 LAB — HEPARIN LEVEL (UNFRACTIONATED)
Heparin Unfractionated: <0.1 IU/mL — ABNORMAL LOW (ref 0.30–0.70)
Heparin Unfractionated: <0.1 IU/mL — ABNORMAL LOW (ref 0.30–0.70)

## 2021-02-18 LAB — C-REACTIVE PROTEIN: CRP: 3.6 mg/dL — ABNORMAL HIGH (ref <1.0)

## 2021-02-18 LAB — FERRITIN: Ferritin: 443 ng/mL — ABNORMAL HIGH (ref 11–307)

## 2021-02-18 LAB — GLUCOSE, CAPILLARY
Glucose-Capillary: 113 mg/dL — ABNORMAL HIGH (ref 70–99)
Glucose-Capillary: 118 mg/dL — ABNORMAL HIGH (ref 70–99)
Glucose-Capillary: 101 mg/dL — ABNORMAL HIGH (ref 70–99)

## 2021-02-18 LAB — MAGNESIUM: Magnesium: 1.8 mg/dL (ref 1.7–2.4)

## 2021-02-18 LAB — D-DIMER, QUANTITATIVE: D-Dimer, Quant: 1.06 ug/mL-FEU — ABNORMAL HIGH (ref 0.00–0.50)

## 2021-02-18 LAB — PHOSPHORUS: Phosphorus: 2.2 mg/dL — ABNORMAL LOW (ref 2.5–4.6)

## 2021-02-18 MED ORDER — POTASSIUM CHLORIDE CRYS ER 20 MEQ PO TBCR
40.0000 meq | EXTENDED_RELEASE_TABLET | Freq: Once | ORAL | Status: AC
  Administered 2021-02-18: 40 meq via ORAL
  Filled 2021-02-18: qty 2

## 2021-02-18 MED ORDER — SODIUM CHLORIDE 0.9 % IV SOLN: INTRAVENOUS | Status: DC

## 2021-02-18 MED ORDER — SODIUM CHLORIDE 0.9 % IV SOLN
80.0000 mg | INTRAVENOUS | Status: AC
  Administered 2021-02-19: 80 mg
  Filled 2021-02-18: qty 2

## 2021-02-18 MED ORDER — MAGNESIUM OXIDE -MG SUPPLEMENT 400 (240 MG) MG PO TABS
800.0000 mg | ORAL_TABLET | Freq: Two times a day (BID) | ORAL | Status: DC
  Administered 2021-02-18 – 2021-02-20 (×5): 800 mg via ORAL
  Filled 2021-02-18 (×5): qty 2

## 2021-02-18 MED ORDER — ENOXAPARIN SODIUM 40 MG/0.4ML IJ SOSY
40.0000 mg | PREFILLED_SYRINGE | INTRAMUSCULAR | Status: DC
  Administered 2021-02-18: 40 mg via SUBCUTANEOUS
  Filled 2021-02-18: qty 0.4

## 2021-02-18 MED ORDER — LEVETIRACETAM ER 500 MG PO TB24: 1000.0000 mg | ORAL_TABLET | Freq: Two times a day (BID) | ORAL | Status: DC

## 2021-02-18 MED ORDER — LEVETIRACETAM 500 MG PO TABS
1000.0000 mg | ORAL_TABLET | Freq: Two times a day (BID) | ORAL | Status: DC
  Administered 2021-02-18 – 2021-02-20 (×4): 1000 mg via ORAL
  Filled 2021-02-18 (×4): qty 2

## 2021-02-18 MED ORDER — MENTHOL 3 MG MT LOZG: 1.0000 | LOZENGE | OROMUCOSAL | Status: DC | PRN

## 2021-02-18 MED ORDER — VANCOMYCIN HCL IN DEXTROSE 1-5 GM/200ML-% IV SOLN
1000.0000 mg | INTRAVENOUS | Status: AC
  Administered 2021-02-19: 1000 mg via INTRAVENOUS
  Filled 2021-02-18: qty 200

## 2021-02-18 MED ORDER — K PHOS MONO-SOD PHOS DI & MONO 155-852-130 MG PO TABS
500.0000 mg | ORAL_TABLET | Freq: Three times a day (TID) | ORAL | Status: DC
  Administered 2021-02-18 – 2021-02-19 (×3): 500 mg via ORAL
  Filled 2021-02-18 (×3): qty 2

## 2021-02-18 MED ORDER — CHLORHEXIDINE GLUCONATE 4 % EX LIQD
Freq: Once | CUTANEOUS | Status: AC
  Filled 2021-02-18: qty 15

## 2021-02-18 NOTE — Plan of Care (Signed)
  Problem: Education: Goal: Knowledge of General Education information will improve Description Including pain rating scale, medication(s)/side effects and non-pharmacologic comfort measures Outcome: Progressing   Problem: Health Behavior/Discharge Planning: Goal: Ability to manage health-related needs will improve Outcome: Progressing   

## 2021-02-18 NOTE — Progress Notes (Signed)
PROGRESS NOTE  Susan [name] RAQ:[id number] DOB: 1980-09-20 DOA: 02/13/2021 PCP: Cathlean Sauer, MD   LOS: 5 days   Brief narrative: 40 year old female past medical history of alcohol abuse, alcoholic hepatitis, history of GI bleed, hypertension was admitted hospital in the past for alcoholic ketoacidosis, pancreatitis.  Was also treated in the past for major depressive disorder.  Patient was brought into the hospital this time with witnessed seizure-like activity.  Patient had seizure-like activity which progressed to cardiac arrest.  Initial rhythm was ventricular fibrillation and patient was defibrillated.  She underwent 10 minutes of CPR with return of circulation.  Patient was then transferred to hospital and was intubated on arrival.  Patient was then admitted to hospital for further evaluation and treatment.  Assessment/Plan:  Principal Problem:   Cardiac arrest [facility]) Active Problems:   Alcohol abuse   Depression   Chronic hypertension   Cardiac arrest- x2, Cardiogenic Shock, Acute systolic heart failure with prolonged QT and nonischemic cardiomyopathy.  Patient initially had ventricular fibrillation with cardiac arrest.  CTA chest and head was unremarkable.  Patient had polymorphic VT after extubation on 02/14/21 and after receiving IV Zofran.  2D echocardiogram showed LV ejection fraction of 30 to 35% with regional wall motion abnormality.  Cardiac catheterization was done with normal coronaries.  Cardiac MRI showed LV dysfunction at 37% with the basal and apical wall thickening.  Was on heparin drip for possible apical thrombus. Has been discontinued by cardiology.  Continue Entresto, nadolol. Cardiology on board.  Cardiology has plans for AICD placement.  COVID 19 with Acute hypoxemic respiratory failure secondary to Aspiration PNA Aspiration pneumonia secondary to cardiac arrest.  Extubated on 02/14/21.  On nasal cannula oxygen.  No leukocytosis or fever.  Completed Rocephin  course.  Blood cultures negative in 4 days.    Seizure: suspect secondary to arhythmia  MRI of the brain reviewed showed no acute intracranial abnormality.Patient is on [place].  We will continue.  No further recommendations given   Elevated LFT.  Likely secondary to cardiac arrest and shock liver, has improved.  Diarrhea/Vomiting Improved.  Right upper quadrant ultrasound showed echogenic liver.  Avoid Zofran due to prolonged QT.     Macrocytic anemia. Continue multivitamin, folate.  Hypokalemia- Continue to replenish to keep potassium more than 4.0   Hypomagnesemia. Plan is to keep more than 2.  Continue magnesium oxide  Hypophosphatemia.  We will continue to replenish with K-Phos.  Check phosphate level in a.m.  Hyponatremia Improved.  Sodium of 135 today  DM type II: Started as gestational diabetes  Latest hemoglobin A1C 5.2.  Continue sliding insulin, Accu-Cheks, diabetic diet  Alcohol abuse   ETOH 104 on admission. Continue thiamine, folate, MVI, continue CIWA protocol.  No signs of withdrawal noted   DVT prophylaxis: SCDs Start: 02/13/21 1725  Code Status: Full code  Family Communication: Spoke with multiple family members at bedside including the fiance and the patient's father.  Status is: Inpatient  Remains inpatient appropriate because:IV treatments appropriate due to intensity of illness or inability to take PO and Inpatient level of care appropriate due to severity of illness  Dispo: The patient is from: Home              Anticipated d/c is to: Home              Patient currently is not medically stable to d/c.   Difficult to place patient No  Consultants: PCCM Cardiology  Procedures: MRI of the brain  Anti-infectives:  Rocephin IV completed   Subjective: Today, patient was seen and examined at bedside.  Patient complains of mild chest discomfort but no shortness of breath, dizziness, lightheadedness, nausea or vomiting   Objective: Vitals:    02/18/21 0736 02/18/21 0738  BP: 106/84 105/67  Pulse: 65 66  Resp: (!) 23 18  Temp: 98.7 F (37.1 C)   SpO2: 100% 100%    Intake/Output Summary (Last 24 hours) at 02/18/2021 0844 Last data filed at 02/18/2021 0834 Gross per 24 hour  Intake 700 ml  Output 500 ml  Net 200 ml    Filed Weights   02/16/21 0500 02/17/21 0010 02/18/21 0204  Weight: 56.3 kg 55.4 kg 56.1 kg   Body mass index is 22.62 kg/m.   Physical Exam: GENERAL: Patient is alert awake and oriented. Not in obvious distress. HENT: No scleral pallor or icterus. Pupils equally reactive to light. Oral mucosa is moist NECK: is supple, no gross swelling noted. CHEST: Clear to auscultation. No crackles or wheezes.  Diminished breath sounds bilaterally.  Chest vest in place. CVS: S1 and S2 heard, no murmur. Regular rate and rhythm.  ABDOMEN: Soft, non-tender, bowel sounds are present. EXTREMITIES: No edema. CNS: Cranial nerves are intact. No focal motor deficits. SKIN: warm and dry without rashes.  Data Review: I have personally reviewed the following laboratory data and studies,  CBC: Recent Labs  Lab 02/14/21 1203 02/15/21 0433 02/15/21 1636 02/15/21 1644 02/15/21 1647 02/16/21 0440 02/17/21 0500 02/18/21 0410  WBC 8.8 9.6  --   --   --  7.9 8.7 8.5  NEUTROABS 7.9*  --   --   --   --   --   --   --   HGB 10.3* 9.5*   < > 9.5* 10.9* 9.6* 11.0* 9.9*  HCT 32.0* 28.8*   < > 28.0* 32.0* 29.1* 32.6* 29.2*  MCV 107.7* 107.5*  --   --   --  106.2* 104.5* 106.2*  PLT 222 185  --   --   --  149* 247 275   < > = values in this interval not displayed.    Basic Metabolic Panel: Recent Labs  Lab 02/14/21 1203 02/14/21 1340 02/14/21 2307 02/15/21 0433 02/15/21 1636 02/15/21 1644 02/15/21 1647 02/16/21 0440 02/17/21 0500 02/18/21 0410  NA 126* 135 132* 130*   < > 139 135 132* 133* 135  K 3.1* 3.6 4.3 3.9   < > 3.7 4.1 3.8 3.7 3.9  CL 93* 105 100 96*  --   --   --  102 103 107  CO2 19* 20* 22 22  --   --    --  23 24 23   GLUCOSE 580* 138* 181* 258*  --   --   --  127* 112* 109*  BUN 6 6 <5* <5*  --   --   --  <5* 5* <5*  CREATININE 0.63 0.71 0.48 0.44  --   --   --  0.46 0.59 0.50  CALCIUM 7.3* 7.9* 7.7* 8.0*  --   --   --  7.9* 8.0* 8.2*  MG 2.4 3.1* 1.9 1.9  --   --   --  1.9 1.8 1.8  PHOS 2.9 2.4* 4.2  --   --   --   --  3.6  --  2.2*   < > = values in this interval not displayed.    Liver Function Tests: Recent Labs  Lab 02/13/21 1651 02/14/21 1203 02/14/21 1340  02/15/21 0433 02/17/21 0500  AST 307* 153* 170* 87* 43*  ALT 58* 46* 52* 39 26  ALKPHOS 92 73 82 71 71  BILITOT 0.8 1.3* 1.0 0.8 1.0  PROT 6.3* 5.8* 6.3* 5.9* 5.5*  ALBUMIN 3.0* 2.6* 2.9* 2.6* 2.3*    Recent Labs  Lab 02/13/21 2031  LIPASE 20    Recent Labs  Lab 02/13/21 1758  AMMONIA 59*    Cardiac Enzymes: No results for input(s): CKTOTAL, CKMB, CKMBINDEX, TROPONINI in the last 168 hours. BNP (last 3 results) No results for input(s): BNP in the last 8760 hours.  ProBNP (last 3 results) No results for input(s): PROBNP in the last 8760 hours.  CBG: Recent Labs  Lab 02/16/21 2142 02/17/21 0540 02/17/21 1131 02/17/21 1636 02/17/21 2146  GLUCAP 121* 110* 124* 131* 122*    Recent Results (from the past 240 hour(s))  Resp Panel by RT-PCR (Flu A&B, Covid) Nasopharyngeal Swab     Status: Abnormal   Collection Time: 02/13/21  5:02 PM   Specimen: Nasopharyngeal Swab; Nasopharyngeal(NP) swabs in vial transport medium  Result Value Ref Range Status   SARS Coronavirus 2 by RT PCR POSITIVE (A) NEGATIVE Final    Comment: RESULT CALLED TO, READ BACK BY AND VERIFIED WITH: MEGAN RALL RN 02/13/21 @1914  BY JW (NOTE) SARS-CoV-2 target nucleic acids are DETECTED.  The SARS-CoV-2 RNA is generally detectable in upper respiratory specimens during the acute phase of infection. Positive results are indicative of the presence of the identified virus, but do not rule out bacterial infection or co-infection with other  pathogens not detected by the test. Clinical correlation with patient history and other diagnostic information is necessary to determine patient infection status. The expected result is Negative.  Fact Sheet for Patients: EntrepreneurPulse.com.au  Fact Sheet for Healthcare Providers: IncredibleEmployment.be  This test is not yet approved or cleared by the [place] FDA and  has been authorized for detection and/or diagnosis of SARS-CoV-2 by FDA under an Emergency Use Authorization (EUA).  This EUA will remain in effect (meaning this test can be  used) for the duration of  the COVID-19 declaration under Section 564(b)(1) of the Act, 21 U.S.C. section 360bbb-3(b)(1), unless the authorization is terminated or revoked sooner.     Influenza A by PCR NEGATIVE NEGATIVE Final   Influenza B by PCR NEGATIVE NEGATIVE Final    Comment: (NOTE) The Xpert Xpress SARS-CoV-2/FLU/RSV plus assay is intended as an aid in the diagnosis of influenza from Nasopharyngeal swab specimens and should not be used as a sole basis for treatment. Nasal washings and aspirates are unacceptable for Xpert Xpress SARS-CoV-2/FLU/RSV testing.  Fact Sheet for Patients: EntrepreneurPulse.com.au  Fact Sheet for Healthcare Providers: IncredibleEmployment.be  This test is not yet approved or cleared by the [place] FDA and has been authorized for detection and/or diagnosis of SARS-CoV-2 by FDA under an Emergency Use Authorization (EUA). This EUA will remain in effect (meaning this test can be used) for the duration of the COVID-19 declaration under Section 564(b)(1) of the Act, 21 U.S.C. section 360bbb-3(b)(1), unless the authorization is terminated or revoked.  Performed at [facility], [place] [address] [place], [place] [address]   Culture, blood (routine x 2)     Status: None (Preliminary result)   Collection Time: 02/13/21   5:59 PM   Specimen: BLOOD  Result Value Ref Range Status   Specimen Description BLOOD LEFT ANTECUBITAL  Final   Special Requests   Final    BOTTLES DRAWN  AEROBIC AND ANAEROBIC Blood Culture adequate volume   Culture   Final    NO GROWTH 4 DAYS Performed at [place] [address] [place], [place] [address]    Report Status PENDING  Incomplete  Culture, blood (routine x 2)     Status: None (Preliminary result)   Collection Time: 02/13/21  8:31 PM   Specimen: BLOOD LEFT HAND  Result Value Ref Range Status   Specimen Description BLOOD LEFT HAND  Final   Special Requests   Final    BOTTLES DRAWN AEROBIC AND ANAEROBIC Blood Culture adequate volume   Culture   Final    NO GROWTH 4 DAYS Performed at [facility], [place] [address], [place], [place] [address]    Report Status PENDING  Incomplete      Studies: MR BRAIN W WO CONTRAST  Result Date: 02/16/2021 CLINICAL DATA:  Seizure, abnormal neuro exam EXAM: MRI HEAD WITHOUT AND WITH CONTRAST TECHNIQUE: Multiplanar, multiecho pulse sequences of the brain and surrounding structures were obtained without and with intravenous contrast. CONTRAST:  31m GADAVIST GADOBUTROL 1 MMOL/ML IV SOLN COMPARISON:  CT exams 02/13/2021. FINDINGS: Motion limited study.  Within this limitation: Brain: No acute infarction, hemorrhage, hydrocephalus, extra-axial collection or mass lesion. No abnormal enhancement. Vascular: Major arterial flow voids are maintained at the skull base. Skull and upper cervical spine: Normal marrow signal. Sinuses/Orbits: Mild ethmoid, maxillary and sphenoid sinus mucosal thickening. Unremarkable orbits. Other: No mastoid effusions. IMPRESSION: No evidence of acute intracranial abnormality on this motion limited exam. Electronically Signed   By: FMargaretha SheffieldM.D.   On: 02/16/2021 16:50   MR CARDIAC MORPHOLOGY W WO CONTRAST  Result Date: 02/16/2021 CLINICAL DATA:  Cardiomyopathy of uncertain etiology EXAM: CARDIAC MRI  TECHNIQUE: The patient was scanned on a 1.5 Tesla GE magnet. A dedicated cardiac coil was used. Functional imaging was done using Fiesta sequences. 2,3, and 4 chamber views were done to assess for RWMA's. Modified Simpson's rule using a short axis stack was used to calculate an ejection fraction on a dedicated work sConservation officer, nature The patient received 8 cc of Gadavist. After 10 minutes inversion recovery sequences were used to assess for infiltration and scar tissue. FINDINGS: Bilateral airspace disease, upper lobe predominant and more pronounced right upper lobe. Small L>R bilateral pleural effusions. Normal left ventricular size and wall thickness. The basal segments and the apex appeared to contract normally. The mid-wall segments (anterior, anteroseptal, inferoseptal, inferior, inferolateral but less so the mid anterolateral wall) appeared severely hypokinetic to akinetic. Moderate systolic dysfunction, LV EF 37%. Normal right ventricular size and mildly decreased systolic function, EF 396% Mild left atrial enlargement. Normal right atrium. Trileaflet aortic valve, no significant stenosis. No significant mitral regurgitation noted. On delayed enhancement imaging, there was no myocardial late gadolinium enhancement (LGE). Measurements: LVEDV 149 mL LVSV 54 mL LVEF 37% RVEDV 98 mL RVSV 35 mL RVEF 36% T2 signal 50 basal septal wall, 57 mid septal wall ECV 35% IMPRESSION: 1.  Airspace disease, upper lobe predominant. 2. Moderate LV systolic dysfunction with EF 37%. The basal and apical wall thickening was relatively normal, the mid ventricular segments were severely hypokinetic to akinetic. 3. Normal RV size with mild to moderately decreased systolic function, RV EF 378% 4. Mildly elevated extracellular volume, 35%. There was mildly elevated T2 signal in the mid septal wall. 5.  No myocardial late gadolinium enhancement. The pattern of LV dysfunction could be a mid-wall variant of Takotsubo  cardiomyopathy.  T2 and T1 signal mildly increased, T2 increased in the mid septal wall. Dalton Mclean Electronically Signed   By: Loralie Champagne M.D.   On: 02/16/2021 16:40   US Abdomen Limited RUQ (LIVER/GB)  Result Date: 02/16/2021 CLINICAL DATA:  Transaminitis EXAM: ULTRASOUND ABDOMEN LIMITED RIGHT UPPER QUADRANT COMPARISON:  CT 10/28/2019 FINDINGS: Gallbladder: No gallstones or wall thickening visualized. No sonographic Murphy sign noted by sonographer. Common bile duct: Diameter: 5 mm Liver: Diffusely echogenic. No focal hepatic abnormality portal vein is patent on color Doppler imaging with normal direction of blood flow towards the liver. Other: None. IMPRESSION: Diffusely echogenic liver consistent with hepatic steatosis and or hepatocellular disease. Otherwise negative right upper quadrant abdominal ultrasound. Electronically Signed   By: Donavan Foil M.D.   On: 02/16/2021 16:57      Flora Lipps, MD  [facility] 02/18/2021  If 7PM-7AM, please contact night-coverage

## 2021-02-18 NOTE — Progress Notes (Signed)
[place] for IV heparin to enoxaparin Indication: apical mural thrombus  Allergies  Allergen Reactions   Ibuprofen Anaphylaxis   Ondansetron Hcl Other (See Comments)    Led to QT prolongation and cardiac arrest   Chlorhexidine Other (See Comments)   Penicillins Nausea And Vomiting   Effexor [Venlafaxine] Palpitations and Other (See Comments)    Panic attacks   Latex Rash    Tape irritates the skin/ please use paper tape   Tape Rash    pls use paper tape    Patient Measurements: Height: 5' 2"  (157.5 cm) Weight: 56.1 kg (123 lb 10.9 oz) IBW/kg (Calculated) : 50.1 Heparin Dosing Weight: 58.7 kg  Vital Signs: Temp: 98.4 F (36.9 C) (09/11 1146) Temp Source: Oral (09/11 1146) BP: 117/88 (09/11 1146) Pulse Rate: 72 (09/11 1146)  Labs: Recent Labs    02/16/21 0440 02/16/21 0450 02/16/21 1050 02/16/21 2000 02/17/21 0500 02/17/21 1300 02/17/21 1812 02/18/21 0410 02/18/21 0841  HGB 9.6*  --   --   --  11.0*  --   --  9.9*  --   HCT 29.1*  --   --   --  32.6*  --   --  29.2*  --   PLT 149*  --   --   --  247  --   --  275  --   LABPROT  --   --  14.6  --   --   --   --   --   --   INR  --   --  1.1  --   --   --   --   --   --   HEPARINUNFRC  --    < > 0.10*   < > 0.18*   < > 0.21* <0.10* <0.10*  CREATININE 0.46  --   --   --  0.59  --   --  0.50  --    < > = values in this interval not displayed.     Estimated Creatinine Clearance: 74.7 mL/min (by C-G formula based on SCr of 0.5 mg/dL).   Medical History: Past Medical History:  Diagnosis Date   AKI (acute kidney injury) ([place]) 06/15/2019   admitted with delhydration r/o arf   Alcohol abuse    Alcoholic hepatitis without ascites    Depression    Family history of adverse reaction to anesthesia    MOTHER HAD PANIC ATTACKS & NAUSEA  AFTER   Gastritis    GIB (gastrointestinal bleeding)    Hypertension    Iron deficiency anemia    Pancreatitis    PONV (postoperative  nausea and vomiting)     Medications:  Infusions:   sodium chloride 10 mL/hr at 02/16/21 1900   heparin Stopped (02/18/21 1319)   levETIRAcetam 1,000 mg (02/18/21 1245)    Assessment: 40 yo female admitted after cardiac arrest.  Pharmacy asked to start IV heparin for apical mural thrombus seen on ECHO.  Not taking anticoagulants PTA.  Heparin level <0.10 on heparin 1900 units/hr. Repeated to confirm, heparin level still <0.10. Patient has been consistently subtherapeutic on heparin. Per MD, transition to prophylactic dose enoxaparin.  Goal of Therapy:  Heparin level 0.3-0.7 units/ml Monitor platelets by anticoagulation protocol: Yes   Plan:  Discontinue heparin 1900 units/hr Start enoxaparin 40 mg daily Follow up starting po anticoagulation if no procedures are planned Monitor daily CBC, s/sx of bleeding  Thank you for including pharmacy in the care  of this patient.  Zenaida Deed, PharmD PGY1 Acute Care Pharmacy Resident  Phone: [phone] 02/18/2021  1:20 PM  Please check [email or web address] for unit-specific pharmacy phone numbers.

## 2021-02-18 NOTE — Progress Notes (Signed)
Patient Name: Susan [name]   Patient Profile:    Susan [name] is a 40 y.o. female with a hx of protracted ETOH use, alcoholic pancreatitis, and depression,  who is being seen 02/16/2021 for the evaluation of CM/cardiac arrest/long QT at the request of Dr. Haroldine Laws Adimitted with OOH cardiac arrest, ETOH >100 K 3.0>>2.4   R sided pneumonia  In hospital recurrent VT PM without pause dependency but QT very long ( following Zofran)  EF 30%  Depression treated with Prozac now on hold   Date HR   QTc ([place] correction used for heart rates faster than 100) K  12/17 113 486   10/18 106 467   11/19 78 517   1/21 113 395 3.0  9/21 110 416 3.6  02/13/21 132 520 2.4  02/15/21 73 600 3.9  02/16/21 84 527 3.8   EF 25-30% global hypokinesis  cMRI EF 35% with neg LGE Patient Profile:    Susan [name] is a 40 y.o. female with a hx of ETOH use, alcoholic pancreatitis, and depression,  who is being seen 02/16/2021 for the evaluation of CM/cardiac arrest/long QT at the request of Dr. Haroldine Laws      SUBJECTIVE: Without chest pain or shortness of breath.   Lengthy discussion trying to help her understand 1-that she had a cardiac arrest, 2-the precarious impact of alcohol on her life 3-gift of life that she has with her 3-year-old son, #4-question of QT prolongation as an underlying condition suggested but not clear.  Clearly drug-induced QT prolongation, 5-LGE negative which portends a better prognosis in terms of recovery of her heart muscle   Past Medical History:  Diagnosis Date   AKI (acute kidney injury) ([place]) 06/15/2019   admitted with delhydration r/o arf   Alcohol abuse    Alcoholic hepatitis without ascites    Depression    Family history of adverse reaction to anesthesia    MOTHER HAD PANIC ATTACKS & NAUSEA  AFTER   Gastritis    GIB (gastrointestinal bleeding)    Hypertension    Iron deficiency anemia    Pancreatitis    PONV (postoperative nausea and vomiting)      Scheduled Meds:  Scheduled Meds:  folic acid  1 mg Oral Daily   insulin aspart  0-5 Units Subcutaneous QHS   insulin aspart  0-9 Units Subcutaneous TID WC   magnesium oxide  800 mg Oral BID   multivitamin with minerals  1 tablet Oral Daily   nadolol  20 mg Oral BID   polyethylene glycol  17 g Oral Daily   sacubitril-valsartan  1 tablet Oral BID   sodium chloride flush  10-40 mL Intracatheter Q12H   sodium chloride flush  3 mL Intravenous Q12H   spironolactone  12.5 mg Oral Daily   thiamine injection  100 mg Intravenous Daily   Continuous Infusions:  sodium chloride 10 mL/hr at 02/16/21 1900   heparin 1,900 Units/hr (02/18/21 0655)   levETIRAcetam 1,000 mg (02/17/21 2323)   sodium chloride, acetaminophen, guaiFENesin-dextromethorphan, LORazepam, menthol-cetylpyridinium, sodium chloride flush, sodium chloride flush    PHYSICAL EXAM Vitals:   02/18/21 0204 02/18/21 0419 02/18/21 0736 02/18/21 0738  BP:   106/84 105/67  Pulse:   65 66  Resp:   (!) 23 18  Temp:  98.7 F (37.1 C) 98.7 F (37.1 C)   TempSrc:  Oral Oral   SpO2:  94% 100% 100%  Weight: 56.1 kg  Height:          TELEMETRY: normal sinus  some artifact which looks like flutter but I suspect is sinus   ECG personally reviewed.    Intake/Output Summary (Last 24 hours) at 02/18/2021 1134 Last data filed at 02/18/2021 0834 Gross per 24 hour  Intake 700 ml  Output 500 ml  Net 200 ml     LABS: Basic Metabolic Panel: Recent Labs  Lab 02/14/21 1203 02/14/21 1340 02/14/21 2307 02/15/21 0433 02/15/21 1636 02/15/21 1644 02/15/21 1647 02/16/21 0440 02/17/21 0500 02/18/21 0410  NA 126* 135 132* 130* 140 139 135 132* 133* 135  K 3.1* 3.6 4.3 3.9 3.5 3.7 4.1 3.8 3.7 3.9  CL 93* 105 100 96*  --   --   --  102 103 107  CO2 19* 20* 22 22  --   --   --  23 24 23   GLUCOSE 580* 138* 181* 258*  --   --   --  127* 112* 109*  BUN 6 6 <5* <5*  --   --   --  <5* 5* <5*  CREATININE 0.63 0.71 0.48 0.44  --    --   --  0.46 0.59 0.50  CALCIUM 7.3* 7.9* 7.7* 8.0*  --   --   --  7.9* 8.0* 8.2*  MG 2.4 3.1* 1.9 1.9  --   --   --  1.9 1.8 1.8  PHOS 2.9 2.4* 4.2  --   --   --   --  3.6  --  2.2*    Cardiac Enzymes: No results for input(s): CKTOTAL, CKMB, CKMBINDEX, TROPONINI in the last 72 hours. CBC: Recent Labs  Lab 02/13/21 1651 02/13/21 1657 02/14/21 0325 02/14/21 1203 02/15/21 0433 02/15/21 1636 02/15/21 1644 02/15/21 1647 02/16/21 0440 02/17/21 0500 02/18/21 0410  WBC 8.1  --  8.7 8.8 9.6  --   --   --  7.9 8.7 8.5  NEUTROABS  --   --   --  7.9*  --   --   --   --   --   --   --   HGB 13.3   < > 12.2 10.3* 9.5* 10.2* 9.5* 10.9* 9.6* 11.0* 9.9*  HCT 41.5   < > 35.2* 32.0* 28.8* 30.0* 28.0* 32.0* 29.1* 32.6* 29.2*  MCV 109.5*  --  103.2* 107.7* 107.5*  --   --   --  106.2* 104.5* 106.2*  PLT 240  --  230 222 185  --   --   --  149* 247 275   < > = values in this interval not displayed.    PROTIME: Recent Labs    02/16/21 1050  LABPROT 14.6  INR 1.1    Liver Function Tests: Recent Labs    02/17/21 0500  AST 43*  ALT 26  ALKPHOS 71  BILITOT 1.0  PROT 5.5*  ALBUMIN 2.3*         ASSESSMENT AND PLAN:  OOH Cardiac arrest  with recurrent VT-PM  with QT prolongation but no pause dependency  COVID   Drug-induced QT prolongation  Hypokalemia on arrival   Alcoholism with recurrent sequealae pancreatitis hepatitis  Depression   Cardiomyopathy non ischemic - LGE   Discussed with Dr Reine Just he also is in favor of ICD   Continue nadolol   20 bid   Discontinue heparin  Given long standing hx of QT prolongation (As above) would proceed with ICD implantation  Have reviewed the potential  benefits and risks of ICD implantation including but not limited to death, perforation of heart or lung, lead dislodgement, infection,  device malfunction and inappropriate shocks.  The patient and family express understanding  and are willing to proceed.      Signed, Virl Axe MD  02/18/2021

## 2021-02-19 ENCOUNTER — Inpatient Hospital Stay (HOSPITAL_COMMUNITY)
Admission: EM | Disposition: A | Discharge status: Home / Self Care | Payer: Self-pay | Source: Home / Self Care | Attending: Internal Medicine

## 2021-02-19 ENCOUNTER — Other Ambulatory Visit (HOSPITAL_COMMUNITY): Payer: Self-pay

## 2021-02-19 ENCOUNTER — Encounter (HOSPITAL_COMMUNITY): Payer: Self-pay | Admitting: Pulmonary Disease

## 2021-02-19 ENCOUNTER — Telehealth (HOSPITAL_COMMUNITY): Payer: Self-pay | Admitting: Pharmacy Technician

## 2021-02-19 DIAGNOSIS — I469 Cardiac arrest, cause unspecified: Secondary | ICD-10-CM

## 2021-02-19 HISTORY — PX: ICD IMPLANT: EP1208

## 2021-02-19 LAB — BASIC METABOLIC PANEL
Anion gap: 9 (ref 5–15)
BUN: 5 mg/dL — ABNORMAL LOW (ref 6–20)
CO2: 24 mmol/L (ref 22–32)
Calcium: 8.6 mg/dL — ABNORMAL LOW (ref 8.9–10.3)
Chloride: 102 mmol/L (ref 98–111)
Creatinine, Ser: 0.53 mg/dL (ref 0.44–1.00)
GFR, Estimated: >60 mL/min (ref >60)
Glucose, Bld: 94 mg/dL (ref 70–99)
Potassium: 3.8 mmol/L (ref 3.5–5.1)
Sodium: 135 mmol/L (ref 135–145)

## 2021-02-19 LAB — COOXEMETRY PANEL
Carboxyhemoglobin: 1 % (ref 0.5–1.5)
Methemoglobin: 0.7 % (ref 0.0–1.5)
O2 Saturation: 55.9 %
Total hemoglobin: 9.7 g/dL — ABNORMAL LOW (ref 12.0–16.0)

## 2021-02-19 LAB — PROTIME-INR
INR: 1.1 (ref 0.8–1.2)
Prothrombin Time: 14 seconds (ref 11.4–15.2)

## 2021-02-19 LAB — GLUCOSE, CAPILLARY
Glucose-Capillary: 98 mg/dL (ref 70–99)
Glucose-Capillary: 174 mg/dL — ABNORMAL HIGH (ref 70–99)
Glucose-Capillary: 77 mg/dL (ref 70–99)
Glucose-Capillary: 143 mg/dL — ABNORMAL HIGH (ref 70–99)

## 2021-02-19 LAB — CBC
HCT: 29.4 % — ABNORMAL LOW (ref 36.0–46.0)
Hemoglobin: 9.8 g/dL — ABNORMAL LOW (ref 12.0–15.0)
MCH: 35.3 pg — ABNORMAL HIGH (ref 26.0–34.0)
MCHC: 33.3 g/dL (ref 30.0–36.0)
MCV: 105.8 fL — ABNORMAL HIGH (ref 80.0–100.0)
Platelets: 339 10*3/uL (ref 150–400)
RBC: 2.78 MIL/uL — ABNORMAL LOW (ref 3.87–5.11)
RDW: 12.9 % (ref 11.5–15.5)
WBC: 7 10*3/uL (ref 4.0–10.5)
nRBC: 0.3 % — ABNORMAL HIGH (ref 0.0–0.2)

## 2021-02-19 LAB — SURGICAL PCR SCREEN
MRSA, PCR: NEGATIVE
Staphylococcus aureus: NEGATIVE

## 2021-02-19 LAB — OSMOLALITY, URINE: Osmolality, Ur: 199 mosm/kg — ABNORMAL LOW (ref 300–900)

## 2021-02-19 LAB — MAGNESIUM: Magnesium: 1.8 mg/dL (ref 1.7–2.4)

## 2021-02-19 LAB — PREGNANCY, URINE: Preg Test, Ur: NEGATIVE

## 2021-02-19 LAB — PHOSPHORUS: Phosphorus: 4.1 mg/dL (ref 2.5–4.6)

## 2021-02-19 LAB — AMMONIA: Ammonia: 26 umol/L (ref 9–35)

## 2021-02-19 SURGERY — ICD IMPLANT

## 2021-02-19 MED ORDER — INFLUENZA VAC SPLIT QUAD 0.5 ML IM SUSY
0.5000 mL | PREFILLED_SYRINGE | INTRAMUSCULAR | Status: AC
  Administered 2021-02-20: 0.5 mL via INTRAMUSCULAR
  Filled 2021-02-19: qty 0.5

## 2021-02-19 MED ORDER — VANCOMYCIN HCL IN DEXTROSE 1-5 GM/200ML-% IV SOLN
1000.0000 mg | Freq: Two times a day (BID) | INTRAVENOUS | Status: AC
  Administered 2021-02-20: 1000 mg via INTRAVENOUS
  Filled 2021-02-19 (×2): qty 200

## 2021-02-19 MED ORDER — SPIRONOLACTONE 25 MG PO TABS
25.0000 mg | ORAL_TABLET | Freq: Every day | ORAL | Status: DC
  Administered 2021-02-19 – 2021-02-20 (×2): 25 mg via ORAL
  Filled 2021-02-19 (×2): qty 1

## 2021-02-19 MED ORDER — FENTANYL CITRATE (PF) 100 MCG/2ML IJ SOLN
INTRAMUSCULAR | Status: DC | PRN
  Administered 2021-02-19: 25 ug via INTRAVENOUS
  Administered 2021-02-19: 50 ug via INTRAVENOUS

## 2021-02-19 MED ORDER — VANCOMYCIN HCL IN DEXTROSE 1-5 GM/200ML-% IV SOLN
INTRAVENOUS | Status: AC
  Filled 2021-02-19: qty 200

## 2021-02-19 MED ORDER — LIDOCAINE HCL (PF) 1 % IJ SOLN
INTRAMUSCULAR | Status: DC | PRN
  Administered 2021-02-19: 60 mL

## 2021-02-19 MED ORDER — SODIUM CHLORIDE 0.9 % IV SOLN
INTRAVENOUS | Status: AC
  Filled 2021-02-19: qty 2

## 2021-02-19 MED ORDER — THIAMINE HCL 100 MG PO TABS
100.0000 mg | ORAL_TABLET | Freq: Every day | ORAL | Status: DC
  Administered 2021-02-20: 100 mg via ORAL
  Filled 2021-02-19: qty 1

## 2021-02-19 MED ORDER — ACETAMINOPHEN 325 MG PO TABS
325.0000 mg | ORAL_TABLET | ORAL | Status: DC | PRN
  Filled 2021-02-19: qty 2

## 2021-02-19 MED ORDER — HEPARIN (PORCINE) IN NACL 1000-0.9 UT/500ML-% IV SOLN
INTRAVENOUS | Status: DC | PRN
  Administered 2021-02-19: 500 mL

## 2021-02-19 MED ORDER — LIDOCAINE HCL 1 % IJ SOLN
INTRAMUSCULAR | Status: AC
  Filled 2021-02-19: qty 60

## 2021-02-19 MED ORDER — MAGNESIUM SULFATE 4 GM/100ML IV SOLN
4.0000 g | Freq: Once | INTRAVENOUS | Status: AC
  Administered 2021-02-19: 4 g via INTRAVENOUS
  Filled 2021-02-19: qty 100

## 2021-02-19 MED ORDER — HEPARIN (PORCINE) IN NACL 1000-0.9 UT/500ML-% IV SOLN
INTRAVENOUS | Status: AC
  Filled 2021-02-19: qty 500

## 2021-02-19 MED ORDER — ACETAMINOPHEN 325 MG PO TABS
650.0000 mg | ORAL_TABLET | ORAL | Status: DC | PRN
  Administered 2021-02-19: 650 mg via ORAL

## 2021-02-19 MED ORDER — FENTANYL CITRATE (PF) 100 MCG/2ML IJ SOLN
INTRAMUSCULAR | Status: AC
  Filled 2021-02-19: qty 2

## 2021-02-19 MED ORDER — MIDAZOLAM HCL 5 MG/5ML IJ SOLN
INTRAMUSCULAR | Status: DC | PRN
  Administered 2021-02-19 (×2): 2 mg via INTRAVENOUS

## 2021-02-19 MED ORDER — MIDAZOLAM HCL 5 MG/5ML IJ SOLN
INTRAMUSCULAR | Status: AC
  Filled 2021-02-19: qty 5

## 2021-02-19 MED ORDER — POTASSIUM CHLORIDE CRYS ER 20 MEQ PO TBCR
40.0000 meq | EXTENDED_RELEASE_TABLET | Freq: Once | ORAL | Status: AC
  Administered 2021-02-19: 40 meq via ORAL
  Filled 2021-02-19: qty 2

## 2021-02-19 MED FILL — Medication: Qty: 1 | Status: AC

## 2021-02-19 SURGICAL SUPPLY — 8 items
CABLE SURGICAL S-101-97-12 (CABLE) ×2 IMPLANT
HEMOSTAT SURGICEL 2X4 FIBR (HEMOSTASIS) ×1 IMPLANT
ICD MOMENTUM D120 (ICD Generator) ×1 IMPLANT
KIT MICROPUNCTURE NIT STIFF (SHEATH) ×1 IMPLANT
LEAD RELIANCE 0137-59 (Lead) ×1 IMPLANT
PAD PRO RADIOLUCENT 2001M-C (PAD) ×2 IMPLANT
SHEATH 9FR PRELUDE SNAP 13 (SHEATH) ×1 IMPLANT
TRAY PACEMAKER INSERTION (PACKS) ×2 IMPLANT

## 2021-02-19 NOTE — Progress Notes (Addendum)
Progress Note  Patient Name: Susan [name] Date of Encounter: 02/19/2021  [facility] Cardiologist: None previously  Subjective   No CP, no SOB.  A little nasal congestion remains only  Inpatient Medications    Scheduled Meds:  enoxaparin (LOVENOX) injection  40 mg Subcutaneous T06Y   folic acid  1 mg Oral Daily   gentamicin irrigation  80 mg Irrigation On Call   insulin aspart  0-5 Units Subcutaneous QHS   insulin aspart  0-9 Units Subcutaneous TID WC   levETIRAcetam  1,000 mg Oral BID   magnesium oxide  800 mg Oral BID   multivitamin with minerals  1 tablet Oral Daily   nadolol  20 mg Oral BID   phosphorus  500 mg Oral TID   polyethylene glycol  17 g Oral Daily   sacubitril-valsartan  1 tablet Oral BID   sodium chloride flush  10-40 mL Intracatheter Q12H   sodium chloride flush  3 mL Intravenous Q12H   spironolactone  12.5 mg Oral Daily   thiamine injection  100 mg Intravenous Daily   Continuous Infusions:  sodium chloride 10 mL/hr at 02/16/21 1900   sodium chloride 50 mL/hr at 02/19/21 0409   sodium chloride 50 mL/hr at 02/19/21 0410   vancomycin     PRN Meds: sodium chloride, acetaminophen, guaiFENesin-dextromethorphan, LORazepam, menthol-cetylpyridinium, sodium chloride flush, sodium chloride flush   Vital Signs    Vitals:   02/18/21 2128 02/18/21 2343 02/19/21 0600 02/19/21 0700  BP: 98/73 (!) 135/93  113/78  Pulse: 69 75    Resp: 18     Temp: 98.6 F (37 C) 99.3 F (37.4 C)  98.7 F (37.1 C)  TempSrc: Oral Oral  Oral  SpO2: 96% 97%    Weight:   55.2 kg   Height:        Intake/Output Summary (Last 24 hours) at 02/19/2021 0844 Last data filed at 02/19/2021 0646 Gross per 24 hour  Intake 1796.07 ml  Output 1000 ml  Net 796.07 ml   Last 3 Weights 02/19/2021 02/18/2021 02/17/2021  Weight (lbs) 121 lb 12.8 oz 123 lb 10.9 oz 122 lb 3.2 oz  Weight (kg) 55.248 kg 56.1 kg 55.43 kg  Some encounter information is confidential and restricted. Go to  Review Flowsheets activity to see all data.      Telemetry    SR 60's generally - Personally Reviewed  ECG    SR 64bpm, QT 498, Qtc 513 - Personally Reviewed  Physical Exam   GEN: No acute distress.   Neck: No JVD Cardiac: RRR, no murmurs, rubs, or gallops.  Respiratory: CTA b/l. GI: Soft, nontender, non-distended  MS: No edema; No deformity. Neuro:  Nonfocal  Psych: Normal affect   Labs    High Sensitivity Troponin:   Recent Labs  Lab 02/14/21 1841 02/14/21 2307  TROPONINIHS 50* 45*      Chemistry Recent Labs  Lab 02/14/21 1340 02/14/21 2307 02/15/21 0433 02/15/21 1636 02/17/21 0500 02/18/21 0410 02/19/21 0335  NA 135   < > 130*   < > 133* 135 135  K 3.6   < > 3.9   < > 3.7 3.9 3.8  CL 105   < > 96*   < > 103 107 102  CO2 20*   < > 22   < > 24 23 24   GLUCOSE 138*   < > 258*   < > 112* 109* 94  BUN 6   < > <5*   < >  5* <5* 5*  CREATININE 0.71   < > 0.44   < > 0.59 0.50 0.53  CALCIUM 7.9*   < > 8.0*   < > 8.0* 8.2* 8.6*  PROT 6.3*  --  5.9*  --  5.5*  --   --   ALBUMIN 2.9*  --  2.6*  --  2.3*  --   --   AST 170*  --  87*  --  43*  --   --   ALT 52*  --  39  --  26  --   --   ALKPHOS 82  --  71  --  71  --   --   BILITOT 1.0  --  0.8  --  1.0  --   --   GFRNONAA >60   < > >60   < > >60 >60 >60  ANIONGAP 10   < > 12   < > 6 5 9    < > = values in this interval not displayed.     Hematology Recent Labs  Lab 02/17/21 0500 02/18/21 0410 02/19/21 0335  WBC 8.7 8.5 7.0  RBC 3.12* 2.75* 2.78*  HGB 11.0* 9.9* 9.8*  HCT 32.6* 29.2* 29.4*  MCV 104.5* 106.2* 105.8*  MCH 35.3* 36.0* 35.3*  MCHC 33.7 33.9 33.3  RDW 12.6 12.7 12.9  PLT 247 275 339    BNPNo results for input(s): BNP, PROBNP in the last 168 hours.   DDimer  Recent Labs  Lab 02/18/21 0410  DDIMER 1.06*     Radiology    No results found.  Cardiac Studies   02/16/21: c.MRI IMPRESSION: 1.  Airspace disease, upper lobe predominant.   2. Moderate LV systolic dysfunction with EF  37%. The basal and apical wall thickening was relatively normal, the mid ventricular segments were severely hypokinetic to akinetic.   3. Normal RV size with mild to moderately decreased systolic function, RV EF 38%.   4. Mildly elevated extracellular volume, 35%. There was mildly elevated T2 signal in the mid septal wall.   5.  No myocardial late gadolinium enhancement.   The pattern of LV dysfunction could be a mid-wall variant of Takotsubo cardiomyopathy. T2 and T1 signal mildly increased, T2 increased in the mid septal wall.   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 Possible layered mural thrombus overlying the apical septal wall. Left  ventricular ejection fraction, by estimation, is 25 to 30%. The left  ventricle has severely decreased function. The left ventricle has no  regional wall motion abnormalities. Left  ventricular diastolic parameters are indeterminate. There is akinesis of  the left ventricular, mid inferoseptal wall and anteroseptal wall. There  is severe hypokinesis of the left ventricular, mid-apical lateral wall.  There is severe hypokinesis of the  left ventricular, basal-mid inferolateral wall. There is severe  hypokinesis of the left ventricular, mid-apical anterior wall.   2. Right ventricular systolic function is normal. The right ventricular  size is normal. There is normal pulmonary artery systolic pressure.   3. The mitral valve is normal in structure. Trivial  mitral valve  regurgitation. No evidence of mitral stenosis.   4. The aortic valve is normal in structure. Aortic valve regurgitation is  not visualized. No aortic  stenosis is present.   5. The inferior vena cava is normal in size with greater than 50%  respiratory variability, suggesting right atrial pressure of 3 mmHg.  Patient Profile     40 y.o. female with a hx of ETOH use, alcoholic pancreatitis, and depression admitted with OOH cardiac arrest  Treated w/ epi + defibrillation. ROSC achieved after ~10 min w/ ? Seizure like activity pre arrest. Transferred to ED and intubated. Hypotensive on arrival requiring levophed. Intoxicated w/ Alcohol, Ethyl level elevated at 104.  Initial K 3.0, Mg 1.7.  EKG sinus tach 132 w/ prolonged QT/QTC, 393/583 ms. Troponins not checked. Head CT negative. CTA of chest negative for PE. CXR w/ patchy airspace disease in the right perihilar lung, favoring atelectasis versus pneumonia. WBC 8.1, Hgb 13, Scr 0.76, CO2 13, Lactic acid 3.8. Admitted by PCCM and placed on TTM   Able to extubate the following day, 02/14/21, subsequently developed nausea/vomiting and given zofran >> ~1hr later, had recurrent polymorphic VT/Torsades. Received CPR + Defib w/ ROSC in ~2 min. Post arrest  EKG showed prolonged QT/QTc, 492/549 ms  Assessment & Plan    Cardiac arrest NICM QT prolongation on nadolol QTc this Am 532m LVEF 25-30% by TTE Normal coronaries prolonged QT on last year's EKG as well   Initial EP consultation felt she had reversible causes for her prolonged QT and VF, recommended GDMT, life vest and if CM did not improve >> ICD After further discussion with EP and AHF team, planned to proceed with ICD implant  On today's schedule with Dr. KCaryl ComesShe has no follow up questions, remains agreeable to proceed  D/w HF APP, volume is good, will order for picc removal today  4. Possible LV mural thrombus on TTE Not noted on c.MRI Heparin gtt stopped On lovenonx > stop > SCDs    5. COVID By record she tested + 5 days prior to admission ( 02/08/21) Will defer to IM, though wonder if her isolation can be  discontinued    For questions or updates, please contact CFrankfordPlease consult [email or web address] for contact info under        Signed, RBaldwin Jamaica PA-C  02/19/2021, 8:44 AM

## 2021-02-19 NOTE — Progress Notes (Signed)
PICC d/c'd per orders for ICD placement.  Vaseline gauze pressure dressing applied with manual pressure held x 5 minutes.  Patient aware to stay lying in bed for 30 minutes post removal, do not get dressing wet, leave on at least 24 hours no more than 48 hours, notify bedside RN if bleeding occurs.  Verbalizes understanding and teach back.  Bedside RN notified of out of bed time 1203.

## 2021-02-19 NOTE — Telephone Encounter (Signed)
Patient Advocate Encounter   Received notification from [facility] that prior authorization for Entresto 24-26 is required.   PA submitted on CoverMyMeds Key [facility] Status is pending   Will continue to follow.

## 2021-02-19 NOTE — TOC CM/SW Note (Signed)
HF TOC CM contacted [employer] rep, Dorian Pod to pick up [employer]. States she will pick up today. Life Vest was cancelled. [place], Heart Failure TOC CM [phone]

## 2021-02-19 NOTE — Interval H&P Note (Signed)
History and Physical Interval Note:  02/19/2021 2:38 PM  Susan [name]  has presented today for surgery, with the diagnosis of cardiac arrest.  The various methods of treatment have been discussed with the patient and family. After consideration of risks, benefits and other options for treatment, the patient has consented to  Procedure(s): ICD IMPLANT (N/A) as a surgical intervention.  The patient's history has been reviewed, patient examined, no change in status, stable for surgery.  I have reviewed the patient's chart and labs.  Questions were answered to the patient's satisfaction.     Virl Axe

## 2021-02-19 NOTE — Progress Notes (Signed)
PROGRESS NOTE  Susan [name] FGH:[id number] DOB: [birth year] DOA: 02/13/2021 PCP: Cathlean Sauer, MD   LOS: 6 days   Brief narrative:  40 year old female past medical history of alcohol abuse, alcoholic hepatitis, history of GI bleed, hypertension, pancreatitis, major depressive disorder presented to hospital with witnessed seizure-like activity.  Patient had seizure-like activity which progressed to cardiac arrest.  Initial rhythm was ventricular fibrillation and patient was defibrillated.  She underwent 10 minutes of CPR with return of circulation.  Patient was then transferred to hospital and was intubated on arrival.    Assessment/Plan:  Principal Problem:   Cardiac arrest [facility]) Active Problems:   Alcohol abuse   Depression   Chronic hypertension   Cardiac arrest- x2, Cardiogenic Shock, Acute systolic heart failure with prolonged QT and nonischemic cardiomyopathy.  Patient initially had ventricular fibrillation with cardiac arrest.  CTA chest and head was unremarkable.  Patient had polymorphic VT after extubation on 02/14/21 and after receiving IV Zofran.  2D echocardiogram showed LV ejection fraction of 30 to 35% with regional wall motion abnormality.  Cardiac catheterization was done with normal coronaries.  Cardiac MRI showed LV dysfunction at 37% with the basal and apical wall thickening.  Patient was initially on  heparin drip for possible apical thrombus.  Anticoagulation has been discontinued by cardiology since there is no thrombus..  Continue Entresto, nadolol. Cardiology on board.  Cardiology has plans for AICD placement on 02/19/21  COVID 19 with Acute hypoxemic respiratory failure secondary to Aspiration PNA Aspiration pneumonia secondary to cardiac arrest.  Extubated on 02/14/21.  On nasal cannula oxygen.  No leukocytosis or fever.  Completed Rocephin IV course.  Blood cultures negative in 4 days.    Seizure:  suspect secondary to arhythmia. MRI of the brain reviewed showed  no acute intracranial abnormality. Patient is on [place].  We will continue.  No further recommendations given   Elevated LFT.  Likely secondary to cardiac arrest and shock liver, has improved.  Diarrhea/Vomiting Improved.  Right upper quadrant ultrasound showed echogenic liver.  Avoid Zofran due to prolonged QT.     Macrocytic anemia. Continue multivitamin, folate.  Hypokalemia- Potassium of 3.8.  Sinew to replenish to keep potassium more than 4  Hypomagnesemia. Plan is to keep more than 2.  Continue magnesium oxide  Hypophosphatemia.  improved after Neutra-Phos replacement.  Hyponatremia Improved.  Sodium of 135 today  DM type II: Started as gestational diabetes  Latest hemoglobin A1C 5.2.  Continue sliding insulin, Accu-Cheks, diabetic diet  Alcohol abuse   ETOH 104 on admission. Continue thiamine, folate, MVI, continue CIWA protocol.  No signs of withdrawal noted   DVT prophylaxis: Place and maintain sequential compression device Start: 02/19/21 0855 SCDs Start: 02/13/21 1725  Code Status: Full code  Family Communication:  None today.  Spoke with the patient's family.  Status is: Inpatient  Remains inpatient appropriate because:IV treatments appropriate due to intensity of illness or inability to take PO and Inpatient level of care appropriate due to severity of illness  Dispo: The patient is from: Home              Anticipated d/c is to: Home              Patient currently is not medically stable to d/c.   Difficult to place patient No  Consultants: PCCM Cardiology  Procedures: MRI of the brain  Anti-infectives:  Rocephin IV completed   Subjective: Today, patient was seen and examined at bedside.  Patient denies any chest  pain, dizziness, lightheadedness.  Awaiting for AICD placement.  Objective: Vitals:   02/18/21 2343 02/19/21 0700  BP: (!) 135/93 113/78  Pulse: 75   Resp:    Temp: 99.3 F (37.4 C) 98.7 F (37.1 C)  SpO2: 97%      Intake/Output Summary (Last 24 hours) at 02/19/2021 0920 Last data filed at 02/19/2021 0646 Gross per 24 hour  Intake 1796.07 ml  Output 1000 ml  Net 796.07 ml    Filed Weights   02/17/21 0010 02/18/21 0204 02/19/21 0600  Weight: 55.4 kg 56.1 kg 55.2 kg   Body mass index is 22.28 kg/m.   Physical Exam:  General: Patient is alert awake and oriented, not in obvious distress HENT:   No scleral pallor or icterus noted. Oral mucosa is moist.  Chest:  Diminished breath sounds bilaterally.   CVS: S1 &S2 heard. No murmur.  Regular rate and rhythm. Abdomen: Soft, nontender, nondistended.  Bowel sounds are heard.   Extremities: No cyanosis, clubbing or edema.  Peripheral pulses are palpable. Psych: Alert, awake and oriented, normal mood CNS:  No cranial nerve deficits.  Power equal in all extremities.   Skin: Warm and dry.  No rashes noted.   Data Review: I have personally reviewed the following laboratory data and studies,  CBC: Recent Labs  Lab 02/14/21 1203 02/15/21 0433 02/15/21 1636 02/15/21 1647 02/16/21 0440 02/17/21 0500 02/18/21 0410 02/19/21 0335  WBC 8.8 9.6  --   --  7.9 8.7 8.5 7.0  NEUTROABS 7.9*  --   --   --   --   --   --   --   HGB 10.3* 9.5*   < > 10.9* 9.6* 11.0* 9.9* 9.8*  HCT 32.0* 28.8*   < > 32.0* 29.1* 32.6* 29.2* 29.4*  MCV 107.7* 107.5*  --   --  106.2* 104.5* 106.2* 105.8*  PLT 222 185  --   --  149* 247 275 339   < > = values in this interval not displayed.    Basic Metabolic Panel: Recent Labs  Lab 02/14/21 1340 02/14/21 2307 02/15/21 0433 02/15/21 1636 02/15/21 1647 02/16/21 0440 02/17/21 0500 02/18/21 0410 02/19/21 0335  NA 135 132* 130*   < > 135 132* 133* 135 135  K 3.6 4.3 3.9   < > 4.1 3.8 3.7 3.9 3.8  CL 105 100 96*  --   --  102 103 107 102  CO2 20* 22 22  --   --  23 24 23 24   GLUCOSE 138* 181* 258*  --   --  127* 112* 109* 94  BUN 6 <5* <5*  --   --  <5* 5* <5* 5*  CREATININE 0.71 0.48 0.44  --   --  0.46 0.59 0.50  0.53  CALCIUM 7.9* 7.7* 8.0*  --   --  7.9* 8.0* 8.2* 8.6*  MG 3.1* 1.9 1.9  --   --  1.9 1.8 1.8 1.8  PHOS 2.4* 4.2  --   --   --  3.6  --  2.2* 4.1   < > = values in this interval not displayed.    Liver Function Tests: Recent Labs  Lab 02/13/21 1651 02/14/21 1203 02/14/21 1340 02/15/21 0433 02/17/21 0500  AST 307* 153* 170* 87* 43*  ALT 58* 46* 52* 39 26  ALKPHOS 92 73 82 71 71  BILITOT 0.8 1.3* 1.0 0.8 1.0  PROT 6.3* 5.8* 6.3* 5.9* 5.5*  ALBUMIN 3.0* 2.6* 2.9* 2.6* 2.3*  Recent Labs  Lab 02/13/21 2031  LIPASE 20    Recent Labs  Lab 02/13/21 1758 02/19/21 0335  AMMONIA 59* 26    Cardiac Enzymes: No results for input(s): CKTOTAL, CKMB, CKMBINDEX, TROPONINI in the last 168 hours. BNP (last 3 results) No results for input(s): BNP in the last 8760 hours.  ProBNP (last 3 results) No results for input(s): PROBNP in the last 8760 hours.  CBG: Recent Labs  Lab 02/17/21 2146 02/18/21 1145 02/18/21 1625 02/18/21 2125 02/19/21 0658  GLUCAP 122* 113* 118* 101* 98    Recent Results (from the past 240 hour(s))  Resp Panel by RT-PCR (Flu A&B, Covid) Nasopharyngeal Swab     Status: Abnormal   Collection Time: 02/13/21  5:02 PM   Specimen: Nasopharyngeal Swab; Nasopharyngeal(NP) swabs in vial transport medium  Result Value Ref Range Status   SARS Coronavirus 2 by RT PCR POSITIVE (A) NEGATIVE Final    Comment: RESULT CALLED TO, READ BACK BY AND VERIFIED WITH: MEGAN RALL RN 02/13/21 @1914  BY JW (NOTE) SARS-CoV-2 target nucleic acids are DETECTED.  The SARS-CoV-2 RNA is generally detectable in upper respiratory specimens during the acute phase of infection. Positive results are indicative of the presence of the identified virus, but do not rule out bacterial infection or co-infection with other pathogens not detected by the test. Clinical correlation with patient history and other diagnostic information is necessary to determine patient infection status. The  expected result is Negative.  Fact Sheet for Patients: EntrepreneurPulse.com.au  Fact Sheet for Healthcare Providers: IncredibleEmployment.be  This test is not yet approved or cleared by the [place] FDA and  has been authorized for detection and/or diagnosis of SARS-CoV-2 by FDA under an Emergency Use Authorization (EUA).  This EUA will remain in effect (meaning this test can be  used) for the duration of  the COVID-19 declaration under Section 564(b)(1) of the Act, 21 U.S.C. section 360bbb-3(b)(1), unless the authorization is terminated or revoked sooner.     Influenza A by PCR NEGATIVE NEGATIVE Final   Influenza B by PCR NEGATIVE NEGATIVE Final    Comment: (NOTE) The Xpert Xpress SARS-CoV-2/FLU/RSV plus assay is intended as an aid in the diagnosis of influenza from Nasopharyngeal swab specimens and should not be used as a sole basis for treatment. Nasal washings and aspirates are unacceptable for Xpert Xpress SARS-CoV-2/FLU/RSV testing.  Fact Sheet for Patients: EntrepreneurPulse.com.au  Fact Sheet for Healthcare Providers: IncredibleEmployment.be  This test is not yet approved or cleared by the [place] FDA and has been authorized for detection and/or diagnosis of SARS-CoV-2 by FDA under an Emergency Use Authorization (EUA). This EUA will remain in effect (meaning this test can be used) for the duration of the COVID-19 declaration under Section 564(b)(1) of the Act, 21 U.S.C. section 360bbb-3(b)(1), unless the authorization is terminated or revoked.  Performed at [facility], [place] [address], [place], [place] [address]   Culture, blood (routine x 2)     Status: None   Collection Time: 02/13/21  5:59 PM   Specimen: BLOOD  Result Value Ref Range Status   Specimen Description BLOOD LEFT ANTECUBITAL  Final   Special Requests   Final    BOTTLES DRAWN AEROBIC AND ANAEROBIC Blood  Culture adequate volume   Culture   Final    NO GROWTH 5 DAYS Performed at [facility], [place] [address], [place], [place] [address]    Report Status 02/18/2021 FINAL  Final  Culture, blood (routine x 2)  Status: None   Collection Time: 02/13/21  8:31 PM   Specimen: BLOOD LEFT HAND  Result Value Ref Range Status   Specimen Description BLOOD LEFT HAND  Final   Special Requests   Final    BOTTLES DRAWN AEROBIC AND ANAEROBIC Blood Culture adequate volume   Culture   Final    NO GROWTH 5 DAYS Performed at [facility], [address] [place], [place] [address]    Report Status 02/18/2021 FINAL  Final  Surgical PCR screen     Status: None   Collection Time: 02/19/21  3:38 AM   Specimen: Nasal Mucosa; Nasal Swab  Result Value Ref Range Status   MRSA, PCR NEGATIVE NEGATIVE Final   Staphylococcus aureus NEGATIVE NEGATIVE Final    Comment: (NOTE) The Xpert SA Assay (FDA approved for NASAL specimens in patients 11 years of age and older), is one component of a comprehensive surveillance program. It is not intended to diagnose infection nor to guide or monitor treatment. Performed at [facility], [place] [address], [place], [place] [address]       Studies: No results found.    Flora Lipps, MD  [facility] 02/19/2021  If 7PM-7AM, please contact night-coverage

## 2021-02-19 NOTE — TOC Progression Note (Addendum)
Transition of Care [name]) - Progression Note    Patient Details  Name: Susan [name] MRN: [id number] Date of Birth: [birth year]  Transition of Care [facility]) CM/SW [place], [place] Phone Number: 02/19/2021, 2:47 PM  Clinical Narrative:    CSW spoke with [name] over the phone due to COVID-19 and attempted to complete an SDOH with her however she had to get off the phone as she is headed for an ICD. CSW will continue to outreach patient. CSW attempted to outreach patient after ICD however patient's phone busy and unable to get through.  CSW will continue to follow throughout discharge.    Expected Discharge Plan: Home/Self Care Barriers to Discharge: Continued Medical Work up  Expected Discharge Plan and Services Expected Discharge Plan: Home/Self Care In-house Referral: Chaplain, Nutrition (For Advanced Directives) Discharge Planning Services: CM Consult   Living arrangements for the past 2 months: Single Family Home                 DME Arranged: Life vest DME Agency: [employer] Date DME Agency Contacted: 02/16/21 Time DME Agency Contacted: [phone] Representative spoke with at DME Agency: Samul Dada             Social Determinants of Health ([place]) Interventions    Readmission Risk Interventions No flowsheet data found.  Maria Gallicchio, MSW, [place] Heart Failure Social Worker

## 2021-02-19 NOTE — Progress Notes (Signed)
Pt completed second Hibiclens bath and received fresh gown.

## 2021-02-19 NOTE — H&P (View-Only) (Signed)
Progress Note  Patient Name: Susan [name] Date of Encounter: 02/19/2021  [facility] Cardiologist: None previously  Subjective   No CP, no SOB.  A little nasal congestion remains only  Inpatient Medications    Scheduled Meds:  enoxaparin (LOVENOX) injection  40 mg Subcutaneous E83T   folic acid  1 mg Oral Daily   gentamicin irrigation  80 mg Irrigation On Call   insulin aspart  0-5 Units Subcutaneous QHS   insulin aspart  0-9 Units Subcutaneous TID WC   levETIRAcetam  1,000 mg Oral BID   magnesium oxide  800 mg Oral BID   multivitamin with minerals  1 tablet Oral Daily   nadolol  20 mg Oral BID   phosphorus  500 mg Oral TID   polyethylene glycol  17 g Oral Daily   sacubitril-valsartan  1 tablet Oral BID   sodium chloride flush  10-40 mL Intracatheter Q12H   sodium chloride flush  3 mL Intravenous Q12H   spironolactone  12.5 mg Oral Daily   thiamine injection  100 mg Intravenous Daily   Continuous Infusions:  sodium chloride 10 mL/hr at 02/16/21 1900   sodium chloride 50 mL/hr at 02/19/21 0409   sodium chloride 50 mL/hr at 02/19/21 0410   vancomycin     PRN Meds: sodium chloride, acetaminophen, guaiFENesin-dextromethorphan, LORazepam, menthol-cetylpyridinium, sodium chloride flush, sodium chloride flush   Vital Signs    Vitals:   02/18/21 2128 02/18/21 2343 02/19/21 0600 02/19/21 0700  BP: 98/73 (!) 135/93  113/78  Pulse: 69 75    Resp: 18     Temp: 98.6 F (37 C) 99.3 F (37.4 C)  98.7 F (37.1 C)  TempSrc: Oral Oral  Oral  SpO2: 96% 97%    Weight:   55.2 kg   Height:        Intake/Output Summary (Last 24 hours) at 02/19/2021 0844 Last data filed at 02/19/2021 0646 Gross per 24 hour  Intake 1796.07 ml  Output 1000 ml  Net 796.07 ml   Last 3 Weights 02/19/2021 02/18/2021 02/17/2021  Weight (lbs) 121 lb 12.8 oz 123 lb 10.9 oz 122 lb 3.2 oz  Weight (kg) 55.248 kg 56.1 kg 55.43 kg  Some encounter information is confidential and restricted. Go to  Review Flowsheets activity to see all data.      Telemetry    SR 60's generally - Personally Reviewed  ECG    SR 64bpm, QT 498, Qtc 513 - Personally Reviewed  Physical Exam   GEN: No acute distress.   Neck: No JVD Cardiac: RRR, no murmurs, rubs, or gallops.  Respiratory: CTA b/l. GI: Soft, nontender, non-distended  MS: No edema; No deformity. Neuro:  Nonfocal  Psych: Normal affect   Labs    High Sensitivity Troponin:   Recent Labs  Lab 02/14/21 1841 02/14/21 2307  TROPONINIHS 50* 45*      Chemistry Recent Labs  Lab 02/14/21 1340 02/14/21 2307 02/15/21 0433 02/15/21 1636 02/17/21 0500 02/18/21 0410 02/19/21 0335  NA 135   < > 130*   < > 133* 135 135  K 3.6   < > 3.9   < > 3.7 3.9 3.8  CL 105   < > 96*   < > 103 107 102  CO2 20*   < > 22   < > 24 23 24   GLUCOSE 138*   < > 258*   < > 112* 109* 94  BUN 6   < > <5*   < >  5* <5* 5*  CREATININE 0.71   < > 0.44   < > 0.59 0.50 0.53  CALCIUM 7.9*   < > 8.0*   < > 8.0* 8.2* 8.6*  PROT 6.3*  --  5.9*  --  5.5*  --   --   ALBUMIN 2.9*  --  2.6*  --  2.3*  --   --   AST 170*  --  87*  --  43*  --   --   ALT 52*  --  39  --  26  --   --   ALKPHOS 82  --  71  --  71  --   --   BILITOT 1.0  --  0.8  --  1.0  --   --   GFRNONAA >60   < > >60   < > >60 >60 >60  ANIONGAP 10   < > 12   < > 6 5 9    < > = values in this interval not displayed.     Hematology Recent Labs  Lab 02/17/21 0500 02/18/21 0410 02/19/21 0335  WBC 8.7 8.5 7.0  RBC 3.12* 2.75* 2.78*  HGB 11.0* 9.9* 9.8*  HCT 32.6* 29.2* 29.4*  MCV 104.5* 106.2* 105.8*  MCH 35.3* 36.0* 35.3*  MCHC 33.7 33.9 33.3  RDW 12.6 12.7 12.9  PLT 247 275 339    BNPNo results for input(s): BNP, PROBNP in the last 168 hours.   DDimer  Recent Labs  Lab 02/18/21 0410  DDIMER 1.06*     Radiology    No results found.  Cardiac Studies   02/16/21: c.MRI IMPRESSION: 1.  Airspace disease, upper lobe predominant.   2. Moderate LV systolic dysfunction with EF  37%. The basal and apical wall thickening was relatively normal, the mid ventricular segments were severely hypokinetic to akinetic.   3. Normal RV size with mild to moderately decreased systolic function, RV EF 24%.   4. Mildly elevated extracellular volume, 35%. There was mildly elevated T2 signal in the mid septal wall.   5.  No myocardial late gadolinium enhancement.   The pattern of LV dysfunction could be a mid-wall variant of Takotsubo cardiomyopathy. T2 and T1 signal mildly increased, T2 increased in the mid septal wall.   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 Possible layered mural thrombus overlying the apical septal wall. Left  ventricular ejection fraction, by estimation, is 25 to 30%. The left  ventricle has severely decreased function. The left ventricle has no  regional wall motion abnormalities. Left  ventricular diastolic parameters are indeterminate. There is akinesis of  the left ventricular, mid inferoseptal wall and anteroseptal wall. There  is severe hypokinesis of the left ventricular, mid-apical lateral wall.  There is severe hypokinesis of the  left ventricular, basal-mid inferolateral wall. There is severe  hypokinesis of the left ventricular, mid-apical anterior wall.   2. Right ventricular systolic function is normal. The right ventricular  size is normal. There is normal pulmonary artery systolic pressure.   3. The mitral valve is normal in structure. Trivial  mitral valve  regurgitation. No evidence of mitral stenosis.   4. The aortic valve is normal in structure. Aortic valve regurgitation is  not visualized. No aortic  stenosis is present.   5. The inferior vena cava is normal in size with greater than 50%  respiratory variability, suggesting right atrial pressure of 3 mmHg.  Patient Profile     40 y.o. female with a hx of ETOH use, alcoholic pancreatitis, and depression admitted with OOH cardiac arrest  Treated w/ epi + defibrillation. ROSC achieved after ~10 min w/ ? Seizure like activity pre arrest. Transferred to ED and intubated. Hypotensive on arrival requiring levophed. Intoxicated w/ Alcohol, Ethyl level elevated at 104.  Initial K 3.0, Mg 1.7.  EKG sinus tach 132 w/ prolonged QT/QTC, 393/583 ms. Troponins not checked. Head CT negative. CTA of chest negative for PE. CXR w/ patchy airspace disease in the right perihilar lung, favoring atelectasis versus pneumonia. WBC 8.1, Hgb 13, Scr 0.76, CO2 13, Lactic acid 3.8. Admitted by PCCM and placed on TTM   Able to extubate the following day, 02/14/21, subsequently developed nausea/vomiting and given zofran >> ~1hr later, had recurrent polymorphic VT/Torsades. Received CPR + Defib w/ ROSC in ~2 min. Post arrest  EKG showed prolonged QT/QTc, 492/549 ms  Assessment & Plan    Cardiac arrest NICM QT prolongation on nadolol QTc this Am 585m LVEF 25-30% by TTE Normal coronaries prolonged QT on last year's EKG as well   Initial EP consultation felt she had reversible causes for her prolonged QT and VF, recommended GDMT, life vest and if CM did not improve >> ICD After further discussion with EP and AHF team, planned to proceed with ICD implant  On today's schedule with Dr. KCaryl ComesShe has no follow up questions, remains agreeable to proceed  D/w HF APP, volume is good, will order for picc removal today  4. Possible LV mural thrombus on TTE Not noted on c.MRI Heparin gtt stopped On lovenonx > stop > SCDs    5. COVID By record she tested + 5 days prior to admission ( 02/08/21) Will defer to IM, though wonder if her isolation can be  discontinued    For questions or updates, please contact CZephyrhills NorthPlease consult [email or web address] for contact info under        Signed, RBaldwin Jamaica PA-C  02/19/2021, 8:44 AM

## 2021-02-19 NOTE — Progress Notes (Addendum)
Advanced Heart Failure Rounding Note  PCP-Cardiologist: Dr. Haroldine Laws    Patient Profile   40 y/o woman with h/o ETOH abuse, chronic pancreatitis, depression. FHx of heart disease.    Admitted with Polymorphic VT/VF arrest in the setting of COVID 19 infection, acute ETOH intoxication and electrolyte abnormalities with QT prolongation. Intubated and shocked in field. Extubated here and had recurrent arrest.    Echo EF 30-35% with regional WMAs (septum, inferolateral wall and apex)  Hs trop 50   ECG without significant ischemic changes. QTc 583  Subjective:   RHC/LHC -RA 3, PCWP 12, CO 3.5, CI 2.3 & normal coronaries.  cMRI: LVEF 37%, RV mildly reduced EF 36%. No myocardial LGE. The pattern of LV dysfunction could be a mid-wall variant of Takotsubo cardiomyopathy. No LV thrombus noted.   Off NE since 9/9. Co-ox 68>57>67>56%. Volume status good. CVP 6. Denies dyspnea.   Scr normal, 0.53. CO2 24. LFTs much improved/normalized.   EKG today, NSR w/ anterolateral TW abnormalities. QT/QTc prolonged, 498/513 ms  NSR on tele, HR 70s. No further VT/VF. No longer on amio gtt.   K 3.8  Mg 1.8  Awaiting ICD today  BP tolerating Entresto and spiro ok. No orthostatic symptoms.   Only complaint today is mild nasal congestion.     Objective:   Weight Range: 55.2 kg Body mass index is 22.28 kg/m.   Vital Signs:   Temp:  [98.4 F (36.9 C)-99.3 F (37.4 C)] 98.7 F (37.1 C) (09/12 0700) Pulse Rate:  [69-75] 75 (09/11 2343) Resp:  [18-22] 18 (09/11 2128) BP: (98-135)/(73-93) 113/78 (09/12 0700) SpO2:  [95 %-99 %] 97 % (09/11 2343) Weight:  [55.2 kg] 55.2 kg (09/12 0600) Last BM Date: 02/18/21  Weight change: Filed Weights   02/17/21 0010 02/18/21 0204 02/19/21 0600  Weight: 55.4 kg 56.1 kg 55.2 kg    Intake/Output:   Intake/Output Summary (Last 24 hours) at 02/19/2021 0823 Last data filed at 02/19/2021 0646 Gross per 24 hour  Intake 2016.07 ml  Output 1000 ml  Net  1016.07 ml      Physical Exam   CVP 6  General:  Well appearing. No respiratory difficulty HEENT: normal Neck: supple. no JVD. Carotids 2+ bilat; no bruits. No lymphadenopathy or thyromegaly appreciated. Cor: PMI nondisplaced. Regular rate & rhythm. No rubs, gallops or murmurs. Lungs: clear Abdomen: soft, nontender, nondistended. No hepatosplenomegaly. No bruits or masses. Good bowel sounds. Extremities: no cyanosis, clubbing, rash, edema + RUE PICC  Neuro: alert & oriented x 3, cranial nerves grossly intact. moves all 4 extremities w/o difficulty. Affect pleasant.   Telemetry   NSR 70s. No VT/VF, personally reviewed   EKG    EKG today, NSR w/ anterolateral TW abnormalities. QT/QTc prolonged, 498/513 ms, personally reviewed   Labs    CBC Recent Labs    02/18/21 0410 02/19/21 0335  WBC 8.5 7.0  HGB 9.9* 9.8*  HCT 29.2* 29.4*  MCV 106.2* 105.8*  PLT 275 419   Basic Metabolic Panel Recent Labs    02/18/21 0410 02/19/21 0335  NA 135 135  K 3.9 3.8  CL 107 102  CO2 23 24  GLUCOSE 109* 94  BUN <5* 5*  CREATININE 0.50 0.53  CALCIUM 8.2* 8.6*  MG 1.8 1.8  PHOS 2.2* 4.1   Liver Function Tests Recent Labs    02/17/21 0500  AST 43*  ALT 26  ALKPHOS 71  BILITOT 1.0  PROT 5.5*  ALBUMIN 2.3*   No results  for input(s): LIPASE, AMYLASE in the last 72 hours.  Cardiac Enzymes No results for input(s): CKTOTAL, CKMB, CKMBINDEX, TROPONINI in the last 72 hours.  BNP: BNP (last 3 results) No results for input(s): BNP in the last 8760 hours.  ProBNP (last 3 results) No results for input(s): PROBNP in the last 8760 hours.   D-Dimer Recent Labs    02/18/21 0410  DDIMER 1.06*   Hemoglobin A1C No results for input(s): HGBA1C in the last 72 hours.  Fasting Lipid Panel No results for input(s): CHOL, HDL, LDLCALC, TRIG, CHOLHDL, LDLDIRECT in the last 72 hours.  Thyroid Function Tests No results for input(s): TSH, T4TOTAL, T3FREE, THYROIDAB in the last 72  hours.  Invalid input(s): FREET3  Other results:   Imaging    No results found.   Medications:     Scheduled Medications:  enoxaparin (LOVENOX) injection  40 mg Subcutaneous K74Q   folic acid  1 mg Oral Daily   gentamicin irrigation  80 mg Irrigation On Call   insulin aspart  0-5 Units Subcutaneous QHS   insulin aspart  0-9 Units Subcutaneous TID WC   levETIRAcetam  1,000 mg Oral BID   magnesium oxide  800 mg Oral BID   multivitamin with minerals  1 tablet Oral Daily   nadolol  20 mg Oral BID   phosphorus  500 mg Oral TID   polyethylene glycol  17 g Oral Daily   sacubitril-valsartan  1 tablet Oral BID   sodium chloride flush  10-40 mL Intracatheter Q12H   sodium chloride flush  3 mL Intravenous Q12H   spironolactone  12.5 mg Oral Daily   thiamine injection  100 mg Intravenous Daily    Infusions:  sodium chloride 10 mL/hr at 02/16/21 1900   sodium chloride 50 mL/hr at 02/19/21 0409   sodium chloride 50 mL/hr at 02/19/21 0410   vancomycin      PRN Medications: sodium chloride, acetaminophen, guaiFENesin-dextromethorphan, LORazepam, menthol-cetylpyridinium, sodium chloride flush, sodium chloride flush     Assessment/Plan   1. COVID 19 Infection  - management per PCCM    2. VFib Arrest x 2 - OOH VT/VF arrest 9/6. Immediate BSCPR + defib w/ ROSC ~10 min  - In setting of hypokalemia/hypomagnesemia w/ prolonged QT interval  - recurrent in hospital PMVT arrest 9/7 w/ 2 min CPR + defib. In setting of hypokalemia (3.1). Post arrest EKG w/ QT/QTC 492/549 ms, after IV Zofran administration. No STE  - LHC no cornary disease.  - Troponin trend and normal ESR argues against viral myocarditis  - cMRI no myocardial LGE  - Etiology of VT remains unclear suspect possible underlying cardiomyopathy (?ETOH) with electrolyte abnormalities. However she also has baseline QT prolongation on previous ECG on 11/05/19 so may have long QT syndrome. - Rhythm now stable on tele w/ no  further VT/VF. Now off amio gtt. QTc remains prolonged on EKG  - EP planing ICD today  - Keep K > 4.0 and Mg >2.0  - Avoid QT prolonging agents - Appreciate EP's assistance    3. Acute Systolic Heart Failure>>Shock   - Shock post arrest. Lactic Acid 3.6. Hypotensive requiring NE - Echo w/ severely reduced LVEF ~30%. No prior study for comparison  - Etiology uncertain. Trop trend argues against viral myocarditis. ? stress induced. Also ? Possible ETOH CM or familial (Maternal Uncle s/p heart transplant in his 63s) - Given recurrent ventricular arrhythmias had LHC. LHC with no coronary disease.   - cMRI c/w NICM. No  LGE  - consider outpatient genetic testing - now off NE. Co-ox marginal but ok, 56%. Remove PICC today   - volume status good. CVP 6  - Continue slow titration of GDMT - Increase Spiro to 25 mg today to help w/ hypokalemia - Continue Entresto 24-25 mg bid  - on Nadolol 20 mg bid (per EP)  - reduction in ETOH intake imperative    4. Hypokalemia/ Hypomagnesemia  - Supp. Keep K > 4.0 and Mg > 2.0  - K 3.8, Mg 1.8  - Give 40 mEq of KCL +  4 g Mg - Increase spiro to 25 mg    5. Prolonged QT Interval  - on Prozac for depression PTA. Currently on hold she wants to restart. - Keppra possible QT prolonging.   - Avoid other QT prolonging agents. - Keep K > 4.0 and Mg > 2.0 . Supp K and Mag  - EP has started nadolol 20 bid to help shorten QT   6. Seizures/ Neuro  - w/ post arrest memory loss  - Neuro following - EEG suggestive of diffuse encephalopathy, nonspecific etiology. No seizures or epileptiform discharges - on IV Keppra    7. ETOH Abuse  - H/o heavy use in the past, prior admit for ETOH pancreatitis  - ETOH level elevated on admit, c/w intoxication - Likely chronic abuse, MCV high at 103. Mg 1.7 on admit, Hepatic enzymes chronically elevated  - CIWA protocol per CCM   8. Hyperglycemia - h/o gestational DM  - Glucose severely elevated 580 on admit  - now  resolved and off insulin gtt  - Hgb A1c 5.2    9. Elevated LFTs - Chronically elevated, likely 2/2 ETOH use - AST 170>>87>>43 - ALT 52>>39>>26   10. ? Aspiration PNA - finished course of ceftriaxone   11. Depression  - PTA on prozac. She wants to restart if possible.  - QTc 513 today  - Will discuss with EP/pharmacy team    D/c PICC today ahead of ICD. Possible d/c home tomorrow.    Length of Stay: [address], PA-C  02/19/2021, 8:23 AM  Advanced Heart Failure Team Pager [phone] (M-F; 7a - 5p)  Please contact [facility] for night-coverage after hours (5p -7a ) and weekends on [email or web address]  Patient seen and examined with the above-signed Advanced Practice Provider and/or Housestaff. I personally reviewed laboratory data, imaging studies and relevant notes. I independently examined the patient and formulated the important aspects of the plan. I have edited the note to reflect any of my changes or salient points. I have personally discussed the plan with the patient and/or family.  Denies CP or SOB. No further VT. Remains confused. Co-ox 56%. CVP 5-6. Bp soft. No further VT.   General:  Well appearing. No resp difficulty HEENT: normal Neck: supple. no JVD. Carotids 2+ bilat; no bruits. No lymphadenopathy or thryomegaly appreciated. Cor: PMI nondisplaced. Regular rate & rhythm. No rubs, gallops or murmurs. Lungs: clear Abdomen: soft, nontender, nondistended. No hepatosplenomegaly. No bruits or masses. Good bowel sounds. Extremities: no cyanosis, clubbing, rash, edema Neuro: alert & orientedx3, cranial nerves grossly intact. moves all 4 extremities w/o difficulty. Affect pleasant  Tolerating GDMT well. On nadolol per EP. Volume status ok. No room to titrate GDMT further at this time.  cMRI without LGE. EF 37%   No further VT. QTC still prolonged at 567m. Agree with plan for ICD today. Ok to pull PICC.   DGlori Bickers MD  11:07  AM

## 2021-02-19 NOTE — Discharge Instructions (Signed)
    Supplemental Discharge Instructions for  Pacemaker/Defibrillator Patients   Activity No heavy lifting or vigorous activity with your left/right arm for 6 to 8 weeks.  Do not raise your left/right arm above your head for one week.  Gradually raise your affected arm as drawn below.             02/24/21                     02/25/21                    02/26/21                  02/27/21 __  NO DRIVING 6 months.  WOUND CARE Keep the wound area clean and dry.  Do not get this area wet , no showers for 24 hours; you may shower on   02/20/21 evening  . Dr. Caryl Comes used DERMABOND to close your wound.  DO NOT peel this off.  Do not rub/scrub the area, pat dry. No bandage is needed on the site.  DO  NOT apply any creams, oils, or ointments to the wound area. If you notice any drainage or discharge from the wound, any swelling or bruising at the site, or you develop a fever > 101? F after you are discharged home, call the office at once.  Special Instructions You are still able to use cellular telephones; use the ear opposite the side where you have your pacemaker/defibrillator.  Avoid carrying your cellular phone near your device. When traveling through airports, show security personnel your identification card to avoid being screened in the metal detectors.  Ask the security personnel to use the hand wand. Avoid arc welding equipment, MRI testing (magnetic resonance imaging), TENS units (transcutaneous nerve stimulators).  Call the office for questions about other devices. Avoid electrical appliances that are in poor condition or are not properly grounded. Microwave ovens are safe to be near or to operate.  Additional information for defibrillator patients should your device go off: If your device goes off ONCE and you feel fine afterward, notify the device clinic nurses. If your device goes off ONCE and you do not feel well afterward, call 911. If your device goes off TWICE, call 911. If your  device goes off THREE times in one day, call 911.  DO NOT DRIVE YOURSELF OR A FAMILY MEMBER WITH A DEFIBRILLATOR TO THE HOSPITAL--CALL 911.

## 2021-02-20 ENCOUNTER — Inpatient Hospital Stay (HOSPITAL_COMMUNITY): Payer: Medicaid Other

## 2021-02-20 ENCOUNTER — Encounter (HOSPITAL_COMMUNITY): Payer: Self-pay | Admitting: Internal Medicine

## 2021-02-20 ENCOUNTER — Telehealth (HOSPITAL_COMMUNITY): Payer: Self-pay | Admitting: Pharmacy Technician

## 2021-02-20 ENCOUNTER — Other Ambulatory Visit (HOSPITAL_COMMUNITY): Payer: Self-pay

## 2021-02-20 DIAGNOSIS — I5021 Acute systolic (congestive) heart failure: Secondary | ICD-10-CM | POA: Diagnosis not present

## 2021-02-20 LAB — CBC
HCT: 32.3 % — ABNORMAL LOW (ref 36.0–46.0)
Hemoglobin: 10.9 g/dL — ABNORMAL LOW (ref 12.0–15.0)
MCH: 35.2 pg — ABNORMAL HIGH (ref 26.0–34.0)
MCHC: 33.7 g/dL (ref 30.0–36.0)
MCV: 104.2 fL — ABNORMAL HIGH (ref 80.0–100.0)
Platelets: 376 10*3/uL (ref 150–400)
RBC: 3.1 MIL/uL — ABNORMAL LOW (ref 3.87–5.11)
RDW: 13.1 % (ref 11.5–15.5)
WBC: 8.8 10*3/uL (ref 4.0–10.5)
nRBC: 0 % (ref 0.0–0.2)

## 2021-02-20 LAB — BASIC METABOLIC PANEL
Anion gap: 8 (ref 5–15)
BUN: 8 mg/dL (ref 6–20)
CO2: 24 mmol/L (ref 22–32)
Calcium: 8.6 mg/dL — ABNORMAL LOW (ref 8.9–10.3)
Chloride: 99 mmol/L (ref 98–111)
Creatinine, Ser: 0.51 mg/dL (ref 0.44–1.00)
GFR, Estimated: >60 mL/min (ref >60)
Glucose, Bld: 225 mg/dL — ABNORMAL HIGH (ref 70–99)
Potassium: 4.4 mmol/L (ref 3.5–5.1)
Sodium: 131 mmol/L — ABNORMAL LOW (ref 135–145)

## 2021-02-20 LAB — METHYLMALONIC ACID, SERUM: Methylmalonic Acid, Quantitative: 80 nmol/L (ref 0–378)

## 2021-02-20 LAB — GLUCOSE, CAPILLARY
Glucose-Capillary: 163 mg/dL — ABNORMAL HIGH (ref 70–99)
Glucose-Capillary: 93 mg/dL (ref 70–99)

## 2021-02-20 LAB — SURGICAL PCR SCREEN

## 2021-02-20 LAB — MAGNESIUM: Magnesium: 2 mg/dL (ref 1.7–2.4)

## 2021-02-20 MED ORDER — FOLIC ACID 1 MG PO TABS
1.0000 mg | ORAL_TABLET | Freq: Every day | ORAL | 0 refills | Status: DC
  Filled 2021-02-20: qty 30, 30d supply, fill #0

## 2021-02-20 MED ORDER — SACUBITRIL-VALSARTAN 24-26 MG PO TABS
1.0000 | ORAL_TABLET | Freq: Two times a day (BID) | ORAL | 2 refills | Status: DC
  Filled 2021-02-20: qty 60, 30d supply, fill #0

## 2021-02-20 MED ORDER — DAPAGLIFLOZIN PROPANEDIOL 10 MG PO TABS
10.0000 mg | ORAL_TABLET | Freq: Every day | ORAL | 2 refills | Status: DC
  Filled 2021-02-20: qty 30, 30d supply, fill #0

## 2021-02-20 MED ORDER — LEVETIRACETAM 500 MG PO TABS
1000.0000 mg | ORAL_TABLET | Freq: Two times a day (BID) | ORAL | 2 refills | Status: DC
  Filled 2021-02-20: qty 120, 30d supply, fill #0

## 2021-02-20 MED ORDER — MAGNESIUM OXIDE -MG SUPPLEMENT 400 (240 MG) MG PO TABS: 400.0000 mg | ORAL_TABLET | Freq: Every day | ORAL | Status: AC

## 2021-02-20 MED ORDER — DAPAGLIFLOZIN PROPANEDIOL 10 MG PO TABS
10.0000 mg | ORAL_TABLET | Freq: Every day | ORAL | Status: DC
  Filled 2021-02-20: qty 1

## 2021-02-20 MED ORDER — NADOLOL 20 MG PO TABS
20.0000 mg | ORAL_TABLET | Freq: Two times a day (BID) | ORAL | 2 refills | Status: DC
  Filled 2021-02-20: qty 60, 30d supply, fill #0

## 2021-02-20 MED ORDER — SPIRONOLACTONE 25 MG PO TABS
25.0000 mg | ORAL_TABLET | Freq: Every day | ORAL | 2 refills | Status: DC
  Filled 2021-02-20: qty 30, 30d supply, fill #0

## 2021-02-20 MED ORDER — THIAMINE HCL 100 MG PO TABS: 100.0000 mg | ORAL_TABLET | Freq: Every day | ORAL | 0 refills | Status: AC

## 2021-02-20 MED FILL — Lidocaine HCl Local Inj 1%: INTRAMUSCULAR | Qty: 60 | Status: AC

## 2021-02-20 NOTE — Progress Notes (Addendum)
Advanced Heart Failure Rounding Note  PCP-Cardiologist: Dr. Haroldine Laws    Patient Profile   40 y/o woman with h/o ETOH abuse, chronic pancreatitis, depression. FHx of heart disease.    Admitted with Polymorphic VT/VF arrest in the setting of COVID 19 infection, acute ETOH intoxication and electrolyte abnormalities with QT prolongation. Intubated and shocked in field. Extubated here and had recurrent arrest.    Echo EF 30-35% with regional WMAs (septum, inferolateral wall and apex)  Hs trop 50   ECG without significant ischemic changes. QTc 583  Subjective:   RHC/LHC -RA 3, PCWP 12, CO 3.5, CI 2.3 & normal coronaries.  cMRI: LVEF 37%, RV mildly reduced EF 36%. No myocardial LGE. The pattern of LV dysfunction could be a mid-wall variant of Takotsubo cardiomyopathy. No LV thrombus noted.   Doing well. No further VT/VF. S/p ICD yesterday. CXR negative for PTX. Device interrogation pending. Device pocket ok.   Denies dyspnea. Continues w/ mild chest discomfort from chest compressions. Seems more clearer today. Less confusion during our conversation. Eager to go home.    Today' EKG: NSR, QT/QTcB 494/521 ms  K 4.4 Mg 2.0   Objective:   Weight Range: 55 kg Body mass index is 22.17 kg/m.   Vital Signs:   Temp:  [98.3 F (36.8 C)-99 F (37.2 C)] 98.3 F (36.8 C) (09/13 0100) Pulse Rate:  [60-83] 66 (09/13 0345) Resp:  [13-26] 18 (09/13 0345) BP: (106-140)/(82-122) 134/101 (09/13 0345) SpO2:  [90 %-100 %] 93 % (09/13 0345) Weight:  [55 kg] 55 kg (09/13 0607) Last BM Date: 02/18/21  Weight change: Filed Weights   02/18/21 0204 02/19/21 0600 02/20/21 0607  Weight: 56.1 kg 55.2 kg 55 kg    Intake/Output:  No intake or output data in the 24 hours ending 02/20/21 0714     Physical Exam   General:  Well appearing. No respiratory difficulty HEENT: normal Neck: supple. no JVD. Carotids 2+ bilat; no bruits. No lymphadenopathy or thyromegaly appreciated. Cor: PMI  nondisplaced. Regular rate & rhythm. No rubs, gallops or murmurs. Device pocket left upper chest stable. No drainage. No hematoma Lungs: clear Abdomen: soft, nontender, nondistended. No hepatosplenomegaly. No bruits or masses. Good bowel sounds. Extremities: no cyanosis, clubbing, rash, edema, LUE in arm sling  Neuro: alert & oriented x 3, cranial nerves grossly intact. moves all 4 extremities w/o difficulty. Affect pleasant.   Telemetry   NSR 80s. No VT/VF, personally reviewed   EKG    EKG today, NSR 67 bpm, QT/QTcB 494/521 ms personally reviewed   Labs    CBC Recent Labs    02/19/21 0335 02/20/21 0423  WBC 7.0 8.8  HGB 9.8* 10.9*  HCT 29.4* 32.3*  MCV 105.8* 104.2*  PLT 339 166   Basic Metabolic Panel Recent Labs    02/18/21 0410 02/19/21 0335 02/20/21 0423  NA 135 135 131*  K 3.9 3.8 4.4  CL 107 102 99  CO2 23 24 24   GLUCOSE 109* 94 225*  BUN <5* 5* 8  CREATININE 0.50 0.53 0.51  CALCIUM 8.2* 8.6* 8.6*  MG 1.8 1.8 2.0  PHOS 2.2* 4.1  --    Liver Function Tests No results for input(s): AST, ALT, ALKPHOS, BILITOT, PROT, ALBUMIN in the last 72 hours.  No results for input(s): LIPASE, AMYLASE in the last 72 hours.  Cardiac Enzymes No results for input(s): CKTOTAL, CKMB, CKMBINDEX, TROPONINI in the last 72 hours.  BNP: BNP (last 3 results) No results for input(s): BNP in  the last 8760 hours.  ProBNP (last 3 results) No results for input(s): PROBNP in the last 8760 hours.   D-Dimer Recent Labs    02/18/21 0410  DDIMER 1.06*   Hemoglobin A1C No results for input(s): HGBA1C in the last 72 hours.  Fasting Lipid Panel No results for input(s): CHOL, HDL, LDLCALC, TRIG, CHOLHDL, LDLDIRECT in the last 72 hours.  Thyroid Function Tests No results for input(s): TSH, T4TOTAL, T3FREE, THYROIDAB in the last 72 hours.  Invalid input(s): FREET3  Other results:   Imaging    No results found.   Medications:     Scheduled Medications:  folic acid   1 mg Oral Daily   influenza vac split quadrivalent PF  0.5 mL Intramuscular Tomorrow-1000   insulin aspart  0-5 Units Subcutaneous QHS   insulin aspart  0-9 Units Subcutaneous TID WC   levETIRAcetam  1,000 mg Oral BID   magnesium oxide  800 mg Oral BID   multivitamin with minerals  1 tablet Oral Daily   nadolol  20 mg Oral BID   polyethylene glycol  17 g Oral Daily   sacubitril-valsartan  1 tablet Oral BID   sodium chloride flush  10-40 mL Intracatheter Q12H   sodium chloride flush  3 mL Intravenous Q12H   spironolactone  25 mg Oral Daily   thiamine  100 mg Oral Daily    Infusions:  sodium chloride 10 mL/hr at 02/16/21 1900   vancomycin 1,000 mg (02/20/21 0635)    PRN Medications: sodium chloride, acetaminophen, acetaminophen, guaiFENesin-dextromethorphan, LORazepam, menthol-cetylpyridinium, sodium chloride flush, sodium chloride flush     Assessment/Plan   1. COVID 19 Infection  - management per PCCM    2. VFib Arrest x 2 - OOH VT/VF arrest 9/6. Immediate BSCPR + defib w/ ROSC ~10 min  - In setting of hypokalemia/hypomagnesemia w/ prolonged QT interval  - recurrent in hospital PMVT arrest 9/7 w/ 2 min CPR + defib. In setting of hypokalemia (3.1). Post arrest EKG w/ QT/QTC 492/549 ms, after IV Zofran administration. No STE  - LHC no cornary disease.  - Troponin trend and normal ESR argues against viral myocarditis  - cMRI no myocardial LGE  - Etiology of VT remains unclear suspect possible underlying cardiomyopathy (?ETOH) with electrolyte abnormalities. However she also has baseline QT prolongation on previous ECG on 11/05/19 so may have long QT syndrome. - Rhythm now stable on tele w/ no further VT/VF. Now off amio gtt. QTc remains prolonged on EKG  - S/p ICD 02/19/21 - Keep K > 4.0 and Mg >2.0  - Avoid QT prolonging agents - Appreciate EP's assistance    3. Acute Systolic Heart Failure>>Shock   - Shock post arrest. Lactic Acid 3.6. Hypotensive requiring NE - Echo w/  severely reduced LVEF ~30%. No prior study for comparison  - Etiology uncertain. Trop trend argues against viral myocarditis. ? stress induced. Also ? Possible ETOH CM or familial (Maternal Uncle s/p heart transplant in his 22s) - Given recurrent ventricular arrhythmias had LHC. LHC with no coronary disease.   - cMRI c/w NICM. No LGE  - consider outpatient genetic testing - now off NE and stable  - volume status good. Euvolemic  - Continue slow titration of GDMT - Continue Spironolactone 25 mg daily  - Continue Entresto 24-25 mg bid  - on Nadolol 20 mg bid (per EP)  - start SGLT2i, Farxiga 10  - reduction in ETOH intake imperative    4. Hypokalemia/ Hypomagnesemia  - stable today,  K 4.4, Mg 2.0  - C/w spiro 25 mg    5. Prolonged QT Interval  - on Prozac for depression PTA. Currently on hold she wants to restart. - Keppra possible QT prolonging.   - Avoid other QT prolonging agents. - Keep K > 4.0 and Mg > 2.0 . Supp K and Mag  - EP has started nadolol 20 bid to help shorten QT   6. Seizures/ Neuro  - w/ post arrest memory loss  - Neuro following - EEG suggestive of diffuse encephalopathy, nonspecific etiology. No seizures or epileptiform discharges - on Keppra    7. ETOH Abuse  - H/o heavy use in the past, prior admit for ETOH pancreatitis  - ETOH level elevated on admit, c/w intoxication - Likely chronic abuse, MCV high at 103. Mg 1.7 on admit, Hepatic enzymes chronically elevated  - CIWA protocol per CCM   8. Hyperglycemia - h/o gestational DM  - Glucose severely elevated 580 on admit  - now resolved and off insulin gtt  - Hgb A1c 5.2    9. Elevated LFTs - Chronically elevated, likely 2/2 ETOH use - AST 170>>87>>43 - ALT 52>>39>>26   10. ? Aspiration PNA - finished course of ceftriaxone   11. Depression  - PTA on prozac, discontinued for prolonged  QTc  - D/w EP/pharmacy team. Will need to avoid restart - she will need to f/u w/ PCP for alternative      Ok  for d/c home from HF standpoint. She has post hospital f/u in [facility] on 9/19. Appointment info in AVS.  Cardiac Meds for Discharge  Nadolol 20 mg bid Entresto 24-26 mg bid Spironolactone 25 mg daily  Farxiga 10 mg daily    Length of Stay: [address] PA-C  02/20/2021, 7:14 AM  Advanced Heart Failure Team Pager [phone] (M-F; 7a - 5p)  Please contact [facility] for night-coverage after hours (5p -7a ) and weekends on [email or web address]   Agree with above. She is s/p ICD implant. Site looks fine. Seems less confused. Vitals stable. No VT. Volume status ok. QTc still prolonged at 521 ms.   Meds reviewed with HF Pharmacy team.   [facility] for d/c today. F/u in HF Clinic.   Glori Bickers, MD  4:11 PM

## 2021-02-20 NOTE — Progress Notes (Addendum)
Progress Note  Patient Name: Susan [name] Date of Encounter: 02/20/2021  [facility] Cardiologist: None previously  Subjective   No CP, no SOB, minimal site discomfort  Inpatient Medications    Scheduled Meds:  folic acid  1 mg Oral Daily   influenza vac split quadrivalent PF  0.5 mL Intramuscular Tomorrow-1000   insulin aspart  0-5 Units Subcutaneous QHS   insulin aspart  0-9 Units Subcutaneous TID WC   levETIRAcetam  1,000 mg Oral BID   magnesium oxide  800 mg Oral BID   multivitamin with minerals  1 tablet Oral Daily   nadolol  20 mg Oral BID   polyethylene glycol  17 g Oral Daily   sacubitril-valsartan  1 tablet Oral BID   sodium chloride flush  10-40 mL Intracatheter Q12H   sodium chloride flush  3 mL Intravenous Q12H   spironolactone  25 mg Oral Daily   thiamine  100 mg Oral Daily   Continuous Infusions:  sodium chloride 10 mL/hr at 02/16/21 1900   PRN Meds: sodium chloride, acetaminophen, acetaminophen, guaiFENesin-dextromethorphan, LORazepam, menthol-cetylpyridinium, sodium chloride flush, sodium chloride flush   Vital Signs    Vitals:   02/19/21 2345 02/20/21 0100 02/20/21 0345 02/20/21 0607  BP: (!) 139/105  (!) 134/101   Pulse: 71  66   Resp: (!) 23  18   Temp:  98.3 F (36.8 C)    TempSrc:      SpO2: 95%  93%   Weight:    55 kg  Height:       No intake or output data in the 24 hours ending 02/20/21 0753  Last 3 Weights 02/20/2021 02/19/2021 02/18/2021  Weight (lbs) 121 lb 3.2 oz 121 lb 12.8 oz 123 lb 10.9 oz  Weight (kg) 54.976 kg 55.248 kg 56.1 kg  Some encounter information is confidential and restricted. Go to Review Flowsheets activity to see all data.      Telemetry    SR 60's generally - Personally Reviewed  ECG    SR 67bpm, QTc 577m - Personally Reviewed  Physical Exam   GEN: No acute distress.   Neck: No JVD Cardiac: RRR, no murmurs, rubs, or gallops.  Respiratory: CTA b/l. GI: Soft, nontender, non-distended  MS: No  edema; No deformity. Neuro:  Nonfocal  Psych: Normal affect   ID site is stable, no bleeding or hematoma, no ecchymosis  Labs    High Sensitivity Troponin:   Recent Labs  Lab 02/14/21 1841 02/14/21 2307  TROPONINIHS 50* 45*      Chemistry Recent Labs  Lab 02/14/21 1340 02/14/21 2307 02/15/21 0433 02/15/21 1636 02/17/21 0500 02/18/21 0410 02/19/21 0335 02/20/21 0423  NA 135   < > 130*   < > 133* 135 135 131*  K 3.6   < > 3.9   < > 3.7 3.9 3.8 4.4  CL 105   < > 96*   < > 103 107 102 99  CO2 20*   < > 22   < > 24 23 24 24   GLUCOSE 138*   < > 258*   < > 112* 109* 94 225*  BUN 6   < > <5*   < > 5* <5* 5* 8  CREATININE 0.71   < > 0.44   < > 0.59 0.50 0.53 0.51  CALCIUM 7.9*   < > 8.0*   < > 8.0* 8.2* 8.6* 8.6*  PROT 6.3*  --  5.9*  --  5.5*  --   --   --  ALBUMIN 2.9*  --  2.6*  --  2.3*  --   --   --   AST 170*  --  87*  --  43*  --   --   --   ALT 52*  --  39  --  26  --   --   --   ALKPHOS 82  --  71  --  71  --   --   --   BILITOT 1.0  --  0.8  --  1.0  --   --   --   GFRNONAA >60   < > >60   < > >60 >60 >60 >60  ANIONGAP 10   < > 12   < > 6 5 9 8    < > = values in this interval not displayed.     Hematology Recent Labs  Lab 02/18/21 0410 02/19/21 0335 02/20/21 0423  WBC 8.5 7.0 8.8  RBC 2.75* 2.78* 3.10*  HGB 9.9* 9.8* 10.9*  HCT 29.2* 29.4* 32.3*  MCV 106.2* 105.8* 104.2*  MCH 36.0* 35.3* 35.2*  MCHC 33.9 33.3 33.7  RDW 12.7 12.9 13.1  PLT 275 339 376    BNPNo results for input(s): BNP, PROBNP in the last 168 hours.   DDimer  Recent Labs  Lab 02/18/21 0410  DDIMER 1.06*     Radiology    No results found.  Cardiac Studies   02/16/21: c.MRI IMPRESSION: 1.  Airspace disease, upper lobe predominant.   2. Moderate LV systolic dysfunction with EF 37%. The basal and apical wall thickening was relatively normal, the mid ventricular segments were severely hypokinetic to akinetic.   3. Normal RV size with mild to moderately decreased  systolic function, RV EF 35%.   4. Mildly elevated extracellular volume, 35%. There was mildly elevated T2 signal in the mid septal wall.   5.  No myocardial late gadolinium enhancement.   The pattern of LV dysfunction could be a mid-wall variant of Takotsubo cardiomyopathy. T2 and T1 signal mildly increased, T2 increased in the mid septal wall.   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 Possible layered mural thrombus overlying the apical septal wall. Left  ventricular ejection fraction, by estimation, is 25 to 30%. The left  ventricle has severely decreased function. The left ventricle has no  regional wall motion abnormalities. Left  ventricular diastolic parameters are indeterminate. There is akinesis of  the left ventricular, mid inferoseptal wall and anteroseptal wall. There  is severe hypokinesis of the left ventricular, mid-apical lateral wall.  There is severe hypokinesis of the  left ventricular, basal-mid inferolateral wall. There is severe  hypokinesis of the left ventricular, mid-apical anterior wall.   2. Right ventricular systolic function is normal. The right ventricular  size is normal. There is normal pulmonary artery systolic pressure.   3. The mitral valve is normal in structure. Trivial mitral valve  regurgitation. No evidence of mitral stenosis.   4. The aortic valve is normal in structure. Aortic valve regurgitation is  not visualized. No aortic stenosis is present.   5.  The inferior vena cava is normal in size with greater than 50%  respiratory variability, suggesting right atrial pressure of 3 mmHg.  Patient Profile     40 y.o.  female with a hx of ETOH use, alcoholic pancreatitis, and depression admitted with OOH cardiac arrest  Treated w/ epi + defibrillation. ROSC achieved after ~10 min w/ ? Seizure like activity pre arrest. Transferred to ED and intubated. Hypotensive on arrival requiring levophed. Intoxicated w/ Alcohol, Ethyl level elevated at 104.  Initial K 3.0, Mg 1.7.  EKG sinus tach 132 w/ prolonged QT/QTC, 393/583 ms. Troponins not checked. Head CT negative. CTA of chest negative for PE. CXR w/ patchy airspace disease in the right perihilar lung, favoring atelectasis versus pneumonia. WBC 8.1, Hgb 13, Scr 0.76, CO2 13, Lactic acid 3.8. Admitted by PCCM and placed on TTM   Able to extubate the following day, 02/14/21, subsequently developed nausea/vomiting and given zofran >> ~1hr later, had recurrent polymorphic VT/Torsades. Received CPR + Defib w/ ROSC in ~2 min. Post arrest  EKG showed prolonged QT/QTc, 492/549 ms  Assessment & Plan    Cardiac arrest NICM QT prolongation on nadolol QTc this Am 566m LVEF 25-30% by TTE Normal coronaries prolonged QT on last year's EKG as well   Initial EP consultation felt she had reversible causes for her prolonged QT and VF, recommended GDMT, life vest and if CM did not improve >> ICD After further discussion with EP and AHF team, planned to proceed with ICD implant  S/p ICD implant yesterday (single lead) Site is stable CXR this am without described ptx Device check this AM with stable device measurements Wound care and activity restrictions discussed with the patient Routine post implant follow up is in place  [place] law, NO driving [id number] re-discussed with the patient  Avoid all QT prolonging medicines, advise PMD or psychiatry pursue alternative to Prozac and similar medicines   OK from EP perspective to discharge when medically ready otherwise Dr. KlCaryl Comesill see later today    4. Possible LV mural thrombus on TTE Not noted on c.MRI Heparin gtt  stopped On lovenonx > stopped for implant > SCDs    5. COVID By record she tested + 5 days prior to admission ( 02/08/21) Will defer to IM, though wonder if her isolation can be discontinued    For questions or updates, please contact [facility] consult [email or web address] for contact info under        Signed, ReBaldwin JamaicaPA-C  02/20/2021, 7:53 AM

## 2021-02-20 NOTE — Discharge Summary (Signed)
Physician Discharge Summary  Susan [name]:[id number] DOB: February 11, 1981 DOA: 02/13/2021  PCP: Cathlean Sauer, MD  Admit date: 02/13/2021 Discharge date: 02/20/2021  Admitted From: Home  Discharge disposition: Home  Recommendations for Outpatient Follow-Up:   Follow up with your primary care provider in one week.  Check CBC, BMP, magnesium in the next visit Follow-up with cardiology as has been scheduled. Follow-up with [facility] in 2 to 4 weeks. Please try to avoid QT prolonging medications for now. Patient would benefit from outpatient psychiatric follow-up to address her history of depression.  Fluoxetine has been discontinued due to QT prolongation.  Discharge Diagnosis:   Principal Problem:   Cardiac arrest [facility]) Active Problems:   Alcohol abuse   Depression   Chronic hypertension   Discharge Condition: Improved.  Diet recommendation: Low sodium, heart healthy.    Wound care: None.  Code status: Full.   History of Present Illness:   40 year old female past medical history of alcohol abuse, alcoholic hepatitis, history of GI bleed, hypertension, pancreatitis, major depressive disorder presented to hospital with witnessed seizure-like activity.  Patient had seizure-like activity which progressed to cardiac arrest.  Initial rhythm was ventricular fibrillation and patient was defibrillated.  She underwent 10 minutes of CPR with return of circulation.  Patient was then transferred to hospital and was intubated on arrival.      Hospital Course:   Following conditions were addressed during hospitalization as listed below,  Cardiac arrest- x2, Cardiogenic Shock, Acute systolic heart failure with prolonged QT and nonischemic cardiomyopathy.   Patient initially had ventricular fibrillation with cardiac arrest.  CTA chest and head was unremarkable.  Patient had polymorphic VT after extubation on 02/14/21 and after receiving IV Zofran.  2D  echocardiogram showed LV ejection fraction of 30 to 35% with regional wall motion abnormality.  Cardiac catheterization was done with normal coronaries.  Cardiac MRI showed LV dysfunction at 37% with the basal and apical wall thickening.  Patient was initially on  heparin drip for possible apical thrombus but was subsequently discontinued.  Continue Entresto, nadolol, Farxiga, spironolactone on discharge.  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  Patient has been seen by EP Cardiology and cardiology who recommended okay for discharge today.   COVID 19 with Acute hypoxemic respiratory failure secondary to Aspiration PNA Aspiration pneumonia secondary to cardiac arrest.  Extubated on 02/14/21.   Completed Rocephin IV course.  Blood cultures were negative.   Seizure:  suspect secondary to arhythmia. MRI of the brain reviewed showed no acute intracranial abnormality. Patient is on [place].  We will continue.  Spoke with neurology about Keppra and recommended continuation.  Neurology to follow the patient in 2 to 4 weeks.    Elevated LFT.  Resolved.  Likely secondary to cardiac arrest and shock liver,    Diarrhea/Vomiting Resolved.  Macrocytic anemia. Continue thiamine, multivitamin and folate.   Hypokalemia- Resolved after replacement.   Hypomagnesemia. Plan is to keep more than 2.  Continue magnesium oxide on discharge.   Hypophosphatemia.  improved after Neutra-Phos replacement.   Hyponatremia Improved.  Sodium of 131.  Will need outpatient monitoring.   DM type II:  Hemoglobin A1c of 5.2.  On Farxiga  Alcohol abuse with history of pancreatitis  ETOH 104 on admission. Continue thiamine, folate on discharge.  She was encouraged on cessation of alcohol  Disposition.  At this time, patient is stable for disposition home with outpatient PCP and cardiology follow-up.  Medical Consultants:   PCCM Cardiology  Procedures:  AICD placement on 02/19/2021 Intubation and  extubation. Subjective:   Today, patient was seen and examined at bedside.  Patient denies any nausea, vomiting fever chills or rigor.  Discharge Exam:   Vitals:   02/20/21 0827 02/20/21 1159  BP:    Pulse: 75   Resp:    Temp: 98.6 F (37 C) 98.5 F (36.9 C)  SpO2: 94%    Vitals:   02/20/21 0345 02/20/21 0607 02/20/21 0827 02/20/21 1159  BP: (!) 134/101     Pulse: 66  75   Resp: 18     Temp: 98.6 F (37 C)  98.6 F (37 C) 98.5 F (36.9 C)  TempSrc:   Oral Oral  SpO2: 93%  94%   Weight:  55 kg    Height:       General: Alert awake, not in obvious distress, thinly built HENT: pupils equally reacting to light,  No scleral pallor or icterus noted. Oral mucosa is moist.  Chest:  Clear breath sounds.  Diminished breath sounds bilaterally. No crackles or wheezes.  AICD in place. CVS: S1 &S2 heard. No murmur.  Regular rate and rhythm. Abdomen: Soft, nontender, nondistended.  Bowel sounds are heard.   Extremities: No cyanosis, clubbing or edema.  Peripheral pulses are palpable. Psych: Alert, awake and oriented, normal mood CNS:  No cranial nerve deficits.  Power equal in all extremities.   Skin: Warm and dry.  No rashes noted.  The results of significant diagnostics from this hospitalization (including imaging, microbiology, ancillary and laboratory) are listed below for reference.     Diagnostic Studies:   CT Angio Head W or Wo Contrast  Result Date: 02/13/2021 CLINICAL DATA:  Neuro deficit, acute, stroke suspected EXAM: CT HEAD WITHOUT CONTRAST CT ANGIOGRAPHY OF THE HEAD AND NECK TECHNIQUE: Contiguous axial images were obtained from the base of the skull through the vertex without intravenous contrast. Multidetector CT imaging of the head and neck was performed using the standard protocol during bolus administration of intravenous contrast. Multiplanar CT image reconstructions and MIPs were obtained to evaluate the vascular anatomy. Carotid stenosis measurements (when  applicable) are obtained utilizing NASCET criteria, using the distal internal carotid diameter as the denominator. CONTRAST:  154m OMNIPAQUE IOHEXOL 350 MG/ML SOLN COMPARISON:  None. FINDINGS: CT HEAD Brain: No evidence of acute infarction, hemorrhage, hydrocephalus, extra-axial collection or mass lesion/mass effect. Vascular: See below. Skull: No acute fracture. Sinuses/Orbits: Visualized sinuses are clear. No acute findings in the visualized orbits. CTA NECK Aortic arch: Great vessel origins are patent. Right carotid system: Patent. No significant (greater than 50%) stenosis. Left carotid system: Patent. No significant (greater than 50%) stenosis. Vertebral arteries:Codominant.Patent. No significant (greater than 50%) stenosis. Skeleton: No evidence of acute osseous abnormality in the cervical spine. Other neck: No evidence of acute abnormality. Chest: Please see concurrent CT chest for intrathoracic evaluation. CTA HEAD Severely motion limited evaluation.  Within this limitation: Anterior circulation: Lateralized course of the petrous ICAs bilaterally which appear to still have bony covering. Bilateral intracranial ICAs, MCAs and ACAs are patent proximally. Severely limited evaluation of the distal MCAs and ACAs due to patient motion. Posterior circulation: Bilateral intradural vertebral arteries, basilar artery and proximal posterior cerebral arteries are patent. Left posterior communicating artery with small left P1 PCA, anatomic variant. Limited evaluation of the distal PCAs due to patient motion. Venous sinuses: Poorly evaluated due to arterial timing and motion. Anatomic variants: As detailed above. IMPRESSION: CT head: No evidence of acute intracranial abnormality. CTA neck: No  significant (greater than 50%) stenosis. CTA head: Severely motion limited exam without evidence of a large vessel occlusion. Electronically Signed   By: Margaretha Sheffield M.D.   On: 02/13/2021 17:54   CT Angio Neck W and/or Wo  Contrast  Result Date: 02/13/2021 CLINICAL DATA:  Neuro deficit, acute, stroke suspected EXAM: CT HEAD WITHOUT CONTRAST CT ANGIOGRAPHY OF THE HEAD AND NECK TECHNIQUE: Contiguous axial images were obtained from the base of the skull through the vertex without intravenous contrast. Multidetector CT imaging of the head and neck was performed using the standard protocol during bolus administration of intravenous contrast. Multiplanar CT image reconstructions and MIPs were obtained to evaluate the vascular anatomy. Carotid stenosis measurements (when applicable) are obtained utilizing NASCET criteria, using the distal internal carotid diameter as the denominator. CONTRAST:  16m OMNIPAQUE IOHEXOL 350 MG/ML SOLN COMPARISON:  None. FINDINGS: CT HEAD Brain: No evidence of acute infarction, hemorrhage, hydrocephalus, extra-axial collection or mass lesion/mass effect. Vascular: See below. Skull: No acute fracture. Sinuses/Orbits: Visualized sinuses are clear. No acute findings in the visualized orbits. CTA NECK Aortic arch: Great vessel origins are patent. Right carotid system: Patent. No significant (greater than 50%) stenosis. Left carotid system: Patent. No significant (greater than 50%) stenosis. Vertebral arteries:Codominant.Patent. No significant (greater than 50%) stenosis. Skeleton: No evidence of acute osseous abnormality in the cervical spine. Other neck: No evidence of acute abnormality. Chest: Please see concurrent CT chest for intrathoracic evaluation. CTA HEAD Severely motion limited evaluation.  Within this limitation: Anterior circulation: Lateralized course of the petrous ICAs bilaterally which appear to still have bony covering. Bilateral intracranial ICAs, MCAs and ACAs are patent proximally. Severely limited evaluation of the distal MCAs and ACAs due to patient motion. Posterior circulation: Bilateral intradural vertebral arteries, basilar artery and proximal posterior cerebral arteries are patent. Left  posterior communicating artery with small left P1 PCA, anatomic variant. Limited evaluation of the distal PCAs due to patient motion. Venous sinuses: Poorly evaluated due to arterial timing and motion. Anatomic variants: As detailed above. IMPRESSION: CT head: No evidence of acute intracranial abnormality. CTA neck: No significant (greater than 50%) stenosis. CTA head: Severely motion limited exam without evidence of a large vessel occlusion. Electronically Signed   By: FMargaretha SheffieldM.D.   On: 02/13/2021 17:54   CT Angio Chest PE W and/or Wo Contrast  Result Date: 02/13/2021 CLINICAL DATA:  Syncope, simple, normal neuro exam Post CPR. EXAM: CT ANGIOGRAPHY CHEST WITH CONTRAST TECHNIQUE: Multidetector CT imaging of the chest was performed using the standard protocol during bolus administration of intravenous contrast. Multiplanar CT image reconstructions and MIPs were obtained to evaluate the vascular anatomy. CONTRAST:  1046mOMNIPAQUE IOHEXOL 350 MG/ML SOLN COMPARISON:  Radiograph earlier today. FINDINGS: Cardiovascular: There are no filling defects within the pulmonary arteries to suggest pulmonary embolus. Normal caliber thoracic aorta. Can not assess for dissection given phase of contrast tailored to pulmonary artery evaluation. The heart is normal in size. No pericardial effusion. Mediastinum/Nodes: Adjustment of the endotracheal tube with tip now above the carina. There is an enteric tube within the esophagus, despite tube decompression the esophagus is patulous. No mediastinal adenopathy. No hilar adenopathy. No evidence of mediastinal hemorrhage. Lungs/Pleura: Multifocal airspace disease with slight areas of ground-glass nodularity throughout both lungs. There is more confluent superimposed consolidation involving the dependent right upper lobe and central right middle lobe. No septal thickening. Trace right pleural thickening but no significant effusion. There is no debris within the trachea or  central bronchi. Upper Abdomen:  Suspected hepatic steatosis not well assessed on this arterial phase exam. Musculoskeletal: Possible nondisplaced left anterior fourth rib fracture. No other acute osseous findings. Bilateral breast implants. No chest wall soft tissue abnormalities. Review of the MIP images confirms the above findings. IMPRESSION: 1. No pulmonary embolus. 2. Confluent right upper lobe consolidation consistent with pneumonia, possible aspiration. Background ground-glass nodularity throughout both lungs likely bronchiolitis, typically infectious or inflammatory. 3. Adjustment of the endotracheal tube with tip now above the carina. Enteric tube within the esophagus, despite tube decompression the esophagus is patulous. 4. Possible nondisplaced left anterior fourth rib fracture. Electronically Signed   By: Keith Rake M.D.   On: 02/13/2021 18:01   DG Chest Port 1 View  Result Date: 02/14/2021 CLINICAL DATA:  Cardiac arrest, post CPR EXAM: PORTABLE CHEST 1 VIEW COMPARISON:  CT 02/13/2021 FINDINGS: Endotracheal and nasogastric tubes have been removed. Defibrillator pads overlie the left lower chest. There is a left neck angio catheter. Persistent right lung airspace disease, increased in comparison to prior exam. No large pleural effusion or visible pneumothorax. Bones are unchanged. IMPRESSION: Endotracheal tube and nasogastric tubes have been removed. Persistent right lung airspace disease, increased from prior exam. Electronically Signed   By: Maurine Simmering M.D.   On: 02/14/2021 12:26   DG Chest Portable 1 View  Result Date: 02/13/2021 CLINICAL DATA:  Intubation. EXAM: PORTABLE CHEST 1 VIEW COMPARISON:  Radiograph 02/17/2020 FINDINGS: Right mainstem intubation. Tip and side port of the enteric tube below the diaphragm in the stomach. Normal heart size. Slightly prominent central pulmonary vasculature. Patchy airspace disease in the right perihilar lung. Trace fluid in the right minor fissure. No  pneumothorax or significant sub pulmonic effusion. No acute osseous abnormalities are seen. IMPRESSION: 1. Right mainstem intubation. Technologist notes state clinician is aware and is retracting the endotracheal tube. 2. Patchy airspace disease in the right perihilar lung, atelectasis versus pneumonia. 3. Central pulmonary vascular prominence. Electronically Signed   By: Keith Rake M.D.   On: 02/13/2021 17:36   EEG adult  Result Date: 02/13/2021 Lora Havens, MD     02/14/2021  9:34 AM Patient Name: EMMALEIGH LONGO MRN: [id number] Epilepsy Attending: Lora Havens Referring Physician/Provider: Hayden Pedro, NP Date: 02/13/2021 Duration: 21.05 mins Patient history: 40 y.o. female who presented to the ED for witnessed-seizure like activity at home witnessed by fiance then with EMS with VF arrest s/p defibrillation and 10 minutes of CPR with PEA until achieving ROSC. EEG to evaluate for seizure. Level of alertness: comatose AEDs during EEG study: Keppra, propofol, Versed Technical aspects: This EEG study was done with scalp electrodes positioned according to the 10-20 International system of electrode placement. Electrical activity was acquired at a sampling rate of 500Hz  and reviewed with a high frequency filter of 70Hz  and a low frequency filter of 1Hz . EEG data were recorded continuously and digitally stored. Description: EEG showed continuous generalized low amplitude 3 to 6 Hz theta-delta slowing.  Hyperventilation and photic stimulation were not performed.   ABNORMALITY - Continuous slow, generalized IMPRESSION: This study is suggestive of moderate to severe diffuse encephalopathy, nonspecific etiology. No seizures or epileptiform discharges were seen throughout the recording. Priyanka Barbra Sarks   Overnight EEG with video  Result Date: 02/14/2021 Lora Havens, MD     02/14/2021 11:14 AM Patient Name: ANIDA DEOL MRN: [id number] Epilepsy Attending: Lora Havens Referring  Physician/Provider: Hayden Pedro, NP Duration: 02/13/2021 2013 to 02/14/2021 1027  Patient history: 40 y.o. female who  presented to the ED for witnessed-seizure like activity at home witnessed by fiance then with EMS with VF arrest s/p defibrillation and 10 minutes of CPR with PEA until achieving ROSC. EEG to evaluate for seizure.  Level of alertness: awake, asleep  AEDs during EEG study: Keppra, propofol, Versed  Technical aspects: This EEG study was done with scalp electrodes positioned according to the 10-20 International system of electrode placement. Electrical activity was acquired at a sampling rate of 500Hz  and reviewed with a high frequency filter of 70Hz  and a low frequency filter of 1Hz . EEG data were recorded continuously and digitally stored.  Description: No clear posterior dominant rhythm was seen. Sleep was characterized by sleep spindles (12 to 14 Hz), maximal frontocentral region.  EEG showed continuous generalized predominantly 5 to 9 Hz theta and alpha activity as well as intermittent generalized 2 to 3 Hz delta slowing.  Hyperventilation and photic stimulation were not performed.    ABNORMALITY - Continuous slow, generalized  IMPRESSION: This study is suggestive of moderate diffuse encephalopathy, nonspecific etiology. No seizures or epileptiform discharges were seen throughout the recording.  Priyanka Barbra Sarks   ECHOCARDIOGRAM COMPLETE  Addendum Date: 02/14/2021   Report called to Cardiology fellow on call Dr. Conley Canal regarding possible apical septal thrombus by definity Fransico Him MD Electronically Amended 02/14/2021, 9:09 PM   Final (Amended)    Result Date: 02/14/2021    ECHOCARDIOGRAM REPORT   Patient Name:   NAYLANI BRADNER Dubas Date of Exam: 02/14/2021 Medical Rec #:  [id number]         Height:       62.0 in Accession #:    [id number]        Weight:       129.4 lb Date of Birth:  August 11, 1980        BSA:          1.589 m Patient Age:    54 years          BP:           109/81 mmHg Patient  Gender: F                 HR:           84 bpm. Exam Location:  Inpatient Procedure: 2D Echo, Cardiac Doppler, Color Doppler and Intracardiac            Opacification Agent STAT ECHO Indications:    Cardiac arrest  History:        Patient has no prior history of Echocardiogram examinations.                 Arrythmias:Cardiac Arrest and Ventricular Fibrillation; Risk                 Factors:Diabetes. Covid+, alcohol abuse.  Sonographer:    Dustin Flock RDCS Referring Phys: [place]  1. Possible layered mural thrombus overlying the apical septal wall. Left ventricular ejection fraction, by estimation, is 25 to 30%. The left ventricle has severely decreased function. The left ventricle has no regional wall motion abnormalities. Left ventricular diastolic parameters are indeterminate. There is akinesis of the left ventricular, mid inferoseptal wall and anteroseptal wall. There is severe hypokinesis of the left ventricular, mid-apical lateral wall. There is severe hypokinesis of the left ventricular, basal-mid inferolateral wall. There is severe hypokinesis of the left ventricular, mid-apical anterior wall.  2. Right ventricular systolic function is normal. The right ventricular size is normal. There is normal  pulmonary artery systolic pressure.  3. The mitral valve is normal in structure. Trivial mitral valve regurgitation. No evidence of mitral stenosis.  4. The aortic valve is normal in structure. Aortic valve regurgitation is not visualized. No aortic stenosis is present.  5. The inferior vena cava is normal in size with greater than 50% respiratory variability, suggesting right atrial pressure of 3 mmHg. FINDINGS  Left Ventricle: Possible layered mural thrombus overlying the apical septal wall. Left ventricular ejection fraction, by estimation, is 25 to 30%. The left ventricle has severely decreased function. The left ventricle has no regional wall motion abnormalities. Severe  hypokinesis of the left ventricular, mid-apical lateral wall. Severe hypokinesis of the left ventricular, basal-mid inferolateral wall. Severe hypokinesis of the left ventricular, mid-apical anterior wall. Definity contrast agent was given IV to delineate the left ventricular endocardial borders. The left ventricular internal cavity size was normal in size. There is no left ventricular hypertrophy. Abnormal (paradoxical) septal motion, consistent with left bundle branch block. Left ventricular diastolic parameters are indeterminate. Normal left ventricular filling pressure. Right Ventricle: The right ventricular size is normal. No increase in right ventricular wall thickness. Right ventricular systolic function is normal. There is normal pulmonary artery systolic pressure. The tricuspid regurgitant velocity is 1.98 m/s, and  with an assumed right atrial pressure of 3 mmHg, the estimated right ventricular systolic pressure is 74.9 mmHg. Left Atrium: Left atrial size was normal in size. Right Atrium: Right atrial size was normal in size. Pericardium: There is no evidence of pericardial effusion. Mitral Valve: The mitral valve is normal in structure. Trivial mitral valve regurgitation. No evidence of mitral valve stenosis. Tricuspid Valve: The tricuspid valve is normal in structure. Tricuspid valve regurgitation is trivial. No evidence of tricuspid stenosis. Aortic Valve: The aortic valve is normal in structure. Aortic valve regurgitation is not visualized. No aortic stenosis is present. Pulmonic Valve: The pulmonic valve was normal in structure. Pulmonic valve regurgitation is not visualized. No evidence of pulmonic stenosis. Aorta: The aortic root is normal in size and structure. Venous: The inferior vena cava is normal in size with greater than 50% respiratory variability, suggesting right atrial pressure of 3 mmHg. IAS/Shunts: No atrial level shunt detected by color flow Doppler.  LEFT VENTRICLE PLAX 2D LVIDd:          5.10 cm  Diastology LVIDs:         4.00 cm  LV e' medial:    6.20 cm/s LV PW:         0.80 cm  LV E/e' medial:  10.0 LV IVS:        0.70 cm  LV e' lateral:   6.42 cm/s LVOT diam:     2.00 cm  LV E/e' lateral: 9.7 LV SV:         46 LV SV Index:   29 LVOT Area:     3.14 cm  RIGHT VENTRICLE RV Basal diam:  2.90 cm RV S prime:     9.14 cm/s TAPSE (M-mode): 2.0 cm LEFT ATRIUM             Index       RIGHT ATRIUM          Index LA diam:        2.90 cm 1.83 cm/m  RA Area:     8.05 cm LA Vol (A2C):   33.0 ml 20.77 ml/m RA Volume:   15.30 ml 9.63 ml/m LA Vol (A4C):   27.5 ml 17.31 ml/m  LA Biplane Vol: 31.6 ml 19.89 ml/m  AORTIC VALVE LVOT Vmax:   74.40 cm/s LVOT Vmean:  54.100 cm/s LVOT VTI:    0.146 m  AORTA Ao Root diam: 2.80 cm MITRAL VALVE               TRICUSPID VALVE MV Area (PHT): 4.86 cm    TR Peak grad:   15.7 mmHg MV Decel Time: 156 msec    TR Vmax:        198.00 cm/s MV E velocity: 62.10 cm/s MV A velocity: 52.70 cm/s  SHUNTS MV E/A ratio:  1.18        Systemic VTI:  0.15 m                            Systemic Diam: 2.00 cm Fransico Him MD Electronically signed by Fransico Him MD Signature Date/Time: 02/14/2021/8:42:00 PM    Final (Amended)    [place] EKG SITE RITE  Result Date: 02/13/2021 If Site Rite image not attached, placement could not be confirmed due to current cardiac rhythm.    Labs:   Basic Metabolic Panel: Recent Labs  Lab 02/14/21 1340 02/14/21 2307 02/15/21 0433 02/16/21 0440 02/17/21 0500 02/18/21 0410 02/19/21 0335 02/20/21 0423  NA 135 132*   < > 132* 133* 135 135 131*  K 3.6 4.3   < > 3.8 3.7 3.9 3.8 4.4  CL 105 100   < > 102 103 107 102 99  CO2 20* 22   < > 23 24 23 24 24   GLUCOSE 138* 181*   < > 127* 112* 109* 94 225*  BUN 6 <5*   < > <5* 5* <5* 5* 8  CREATININE 0.71 0.48   < > 0.46 0.59 0.50 0.53 0.51  CALCIUM 7.9* 7.7*   < > 7.9* 8.0* 8.2* 8.6* 8.6*  MG 3.1* 1.9   < > 1.9 1.8 1.8 1.8 2.0  PHOS 2.4* 4.2  --  3.6  --  2.2* 4.1  --    < > = values in this  interval not displayed.   GFR Estimated Creatinine Clearance: 74.7 mL/min (by C-G formula based on SCr of 0.51 mg/dL). Liver Function Tests: Recent Labs  Lab 02/13/21 1651 02/14/21 1203 02/14/21 1340 02/15/21 0433 02/17/21 0500  AST 307* 153* 170* 87* 43*  ALT 58* 46* 52* 39 26  ALKPHOS 92 73 82 71 71  BILITOT 0.8 1.3* 1.0 0.8 1.0  PROT 6.3* 5.8* 6.3* 5.9* 5.5*  ALBUMIN 3.0* 2.6* 2.9* 2.6* 2.3*   Recent Labs  Lab 02/13/21 2031  LIPASE 20   Recent Labs  Lab 02/13/21 1758 02/19/21 0335  AMMONIA 59* 26   Coagulation profile Recent Labs  Lab 02/16/21 1050 02/18/21 2340  INR 1.1 1.1    CBC: Recent Labs  Lab 02/14/21 1203 02/15/21 0433 02/16/21 0440 02/17/21 0500 02/18/21 0410 02/19/21 0335 02/20/21 0423  WBC 8.8   < > 7.9 8.7 8.5 7.0 8.8  NEUTROABS 7.9*  --   --   --   --   --   --   HGB 10.3*   < > 9.6* 11.0* 9.9* 9.8* 10.9*  HCT 32.0*   < > 29.1* 32.6* 29.2* 29.4* 32.3*  MCV 107.7*   < > 106.2* 104.5* 106.2* 105.8* 104.2*  PLT 222   < > 149* 247 275 339 376   < > = values in this interval not displayed.   Cardiac  Enzymes: No results for input(s): CKTOTAL, CKMB, CKMBINDEX, TROPONINI in the last 168 hours. BNP: Invalid input(s): POCBNP CBG: Recent Labs  Lab 02/19/21 1124 02/19/21 1642 02/19/21 2118 02/20/21 0604 02/20/21 1157  GLUCAP 174* 77 143* 163* 93   D-Dimer Recent Labs    02/18/21 0410  DDIMER 1.06*   Hgb A1c No results for input(s): HGBA1C in the last 72 hours. Lipid Profile No results for input(s): CHOL, HDL, LDLCALC, TRIG, CHOLHDL, LDLDIRECT in the last 72 hours. Thyroid function studies No results for input(s): TSH, T4TOTAL, T3FREE, THYROIDAB in the last 72 hours.  Invalid input(s): FREET3 Anemia work up Recent Labs    02/18/21 0410  FERRITIN 443*   Microbiology Recent Results (from the past 240 hour(s))  Resp Panel by RT-PCR (Flu A&B, Covid) Nasopharyngeal Swab     Status: Abnormal   Collection Time: 02/13/21  5:02 PM    Specimen: Nasopharyngeal Swab; Nasopharyngeal(NP) swabs in vial transport medium  Result Value Ref Range Status   SARS Coronavirus 2 by RT PCR POSITIVE (A) NEGATIVE Final    Comment: RESULT CALLED TO, READ BACK BY AND VERIFIED WITH: MEGAN RALL RN 02/13/21 @1914  BY JW (NOTE) SARS-CoV-2 target nucleic acids are DETECTED.  The SARS-CoV-2 RNA is generally detectable in upper respiratory specimens during the acute phase of infection. Positive results are indicative of the presence of the identified virus, but do not rule out bacterial infection or co-infection with other pathogens not detected by the test. Clinical correlation with patient history and other diagnostic information is necessary to determine patient infection status. The expected result is Negative.  Fact Sheet for Patients: EntrepreneurPulse.com.au  Fact Sheet for Healthcare Providers: IncredibleEmployment.be  This test is not yet approved or cleared by the [place] FDA and  has been authorized for detection and/or diagnosis of SARS-CoV-2 by FDA under an Emergency Use Authorization (EUA).  This EUA will remain in effect (meaning this test can be  used) for the duration of  the COVID-19 declaration under Section 564(b)(1) of the Act, 21 U.S.C. section 360bbb-3(b)(1), unless the authorization is terminated or revoked sooner.     Influenza A by PCR NEGATIVE NEGATIVE Final   Influenza B by PCR NEGATIVE NEGATIVE Final    Comment: (NOTE) The Xpert Xpress SARS-CoV-2/FLU/RSV plus assay is intended as an aid in the diagnosis of influenza from Nasopharyngeal swab specimens and should not be used as a sole basis for treatment. Nasal washings and aspirates are unacceptable for Xpert Xpress SARS-CoV-2/FLU/RSV testing.  Fact Sheet for Patients: EntrepreneurPulse.com.au  Fact Sheet for Healthcare Providers: IncredibleEmployment.be  This test is not  yet approved or cleared by the [place] FDA and has been authorized for detection and/or diagnosis of SARS-CoV-2 by FDA under an Emergency Use Authorization (EUA). This EUA will remain in effect (meaning this test can be used) for the duration of the COVID-19 declaration under Section 564(b)(1) of the Act, 21 U.S.C. section 360bbb-3(b)(1), unless the authorization is terminated or revoked.  Performed at [facility], [place] [address], [place], [place] [address]   Culture, blood (routine x 2)     Status: None   Collection Time: 02/13/21  5:59 PM   Specimen: BLOOD  Result Value Ref Range Status   Specimen Description BLOOD LEFT ANTECUBITAL  Final   Special Requests   Final    BOTTLES DRAWN AEROBIC AND ANAEROBIC Blood Culture adequate volume   Culture   Final    NO GROWTH 5 DAYS Performed at [facility]  Lab, [address], [place], [place] [address]    Report Status 02/18/2021 FINAL  Final  Culture, blood (routine x 2)     Status: None   Collection Time: 02/13/21  8:31 PM   Specimen: BLOOD LEFT HAND  Result Value Ref Range Status   Specimen Description BLOOD LEFT HAND  Final   Special Requests   Final    BOTTLES DRAWN AEROBIC AND ANAEROBIC Blood Culture adequate volume   Culture   Final    NO GROWTH 5 DAYS Performed at [facility], [place] [address] [place], [place] [address]    Report Status 02/18/2021 FINAL  Final  Surgical PCR screen     Status: None   Collection Time: 02/18/21 11:45 PM   Specimen: Nasal Mucosa; Nasal Swab  Result Value Ref Range Status   MRSA, PCR  NEGATIVE Final    THIS TEST WAS CREDITED AND RECOLLECT WAS SENT AND WAS TESTED AND RESULTED 02/19/21 BY TW   Staphylococcus aureus  NEGATIVE Final    THIS TEST WAS CREDITED AND RECOLLECT WAS SENT AND WAS TESTED AND RESULTED BY TW 02/19/21.    Comment: Performed at [facility], [place] [address] [place], [place] [address]  Surgical PCR screen     Status: None   Collection Time: 02/19/21   3:38 AM   Specimen: Nasal Mucosa; Nasal Swab  Result Value Ref Range Status   MRSA, PCR NEGATIVE NEGATIVE Final   Staphylococcus aureus NEGATIVE NEGATIVE Final    Comment: (NOTE) The Xpert SA Assay (FDA approved for NASAL specimens in patients 59 years of age and older), is one component of a comprehensive surveillance program. It is not intended to diagnose infection nor to guide or monitor treatment. Performed at [facility], [place] [address] [place],  [address]      Discharge Instructions:   Discharge Instructions     Ambulatory referral to Neurology   Complete by: As directed    An appointment is requested in approximately: 2-4 weeks   Call MD for:  redness, tenderness, or signs of infection (pain, swelling, redness, odor or green/yellow discharge around incision site)   Complete by: As directed    Call MD for:  severe uncontrolled pain   Complete by: As directed    Call MD for:  temperature >100.4   Complete by: As directed    Diet - low sodium heart healthy   Complete by: As directed    Discharge instructions   Complete by: As directed    Follow up with your primary care physician in 1 week.  Follow-up with cardiology as has been scheduled. Follow up with [facility] neurology associates as outpatient. (Clinic to schedule appointment).  No overexertion.  No alcohol consumption.  Try to avoid Zofran Prozac for now.  Follow-up with your primary care physician to discuss about medications safe for the  heart going forward.   Increase activity slowly   Complete by: As directed       Allergies as of 02/20/2021       Reactions   Ibuprofen Anaphylaxis   Ondansetron Hcl Other (See Comments)   Led to QT prolongation and cardiac arrest   Chlorhexidine Other (See Comments)   Penicillins Nausea And Vomiting   Effexor [venlafaxine] Palpitations, Other (See Comments)   Panic attacks   Latex Rash   Tape irritates the skin/ please use paper tape   Tape Rash   pls  use paper tape  Medication List     STOP taking these medications    FLUoxetine 20 MG capsule Commonly known as: PROZAC   ondansetron 4 MG tablet Commonly known as: Zofran   pantoprazole 40 MG tablet Commonly known as: Protonix   traMADol 50 MG tablet Commonly known as: Ultram       TAKE these medications    cetirizine 10 MG tablet Commonly known as: ZYRTEC Take 10 mg by mouth daily as needed for allergies.   Entresto 24-26 MG Generic drug: sacubitril-valsartan Take 1 tablet by mouth 2 (two) times daily.   Farxiga 10 MG Tabs tablet Generic drug: dapagliflozin propanediol Take 1 tablet (10 mg total) by mouth daily before breakfast.   folic acid 1 MG tablet Commonly known as: FOLVITE Take 1 tablet (1 mg total) by mouth daily.   levETIRAcetam 500 MG tablet Commonly known as: KEPPRA Take 2 tablets (1,000 mg total) by mouth 2 (two) times daily.   magnesium oxide 400 (240 Mg) MG tablet Commonly known as: MAG-OX Take 1 tablet (400 mg total) by mouth daily.   multivitamin with minerals Tabs tablet Take 1 tablet by mouth daily.   nadolol 20 MG tablet Commonly known as: CORGARD Take 1 tablet (20 mg total) by mouth 2 (two) times daily.   spironolactone 25 MG tablet Commonly known as: ALDACTONE Take 1 tablet (25 mg total) by mouth daily.   thiamine 100 MG tablet Take 1 tablet (100 mg total) by mouth daily.        Follow-up Information     [facility] Follow up on 02/26/2021.   Specialty: Cardiology Why: at 2:00 Entr[facility]. Contact information: [address] [id number] [place]        Deboraha Sprang, MD .   Specialty: Cardiology Why: 05/23/21 @ 8:45AM Contact information: [address] [place] [address] [phone]         Shirley Friar, PA-C Follow up.   Specialty: Physician  Assistant Why: 03/01/21 @ 9:00AM, wound check visit Contact information: [place] [address] [phone]         Cathlean Sauer, MD. Go on 03/07/2021.   Specialty: Family Medicine Why: @1 :00pm Contact information: [address] [place] [address] [phone]         Tomasa Hose, NP. Go in 3 day(s).   Specialty: Nurse Practitioner Why: Hospital Follow up scheduled for Friday 02/23/21 at 11:30am Contact information: [place] [id number] [phone]                  Time coordinating discharge: 39 minutes  Signed:  Storie Heffern  [facility] 02/20/2021, 3:04 PM

## 2021-02-20 NOTE — TOC Progression Note (Signed)
Transition of Care [facility]) - Progression Note    Patient Details  Name: Susan [name] MRN: [id number] Date of Birth: 19-Feb-1981  Transition of Care [facility]) CM/SW [place], [place] Phone Number: 02/20/2021, 12:41 PM  Clinical Narrative:    CSW spoke with the patient over the phone due to COVID-19 and completed a very brief SDOH with the patient who reporting needing transportation since she can't drive for 6 months due to her ICD. CSW enrolled Susan [name] in [employer] and texted her their information. Patient reported they do have a PCP and she has previously been able to get to the pharmacy to pick up her medications. CSW scheduled a PCP follow up with her primary care provider Susan [name] for 02/23/21 at 11:30am. Susan [name] will need to fill out disability paperwork with the [facility] at her follow up appointment at the [facility] on 02/26/21 at 2pm and encouraged her to speak with social work at her appointment about this. Susan [name] declined substance use resources from the CSW. Susan [name] reports having transportation home today at time of discharge.   CSW will sign off for now as social work intervention is no longer needed. Please consult [place] again if new needs arise.   Expected Discharge Plan: Home/Self Care Barriers to Discharge: Continued Medical Work up  Expected Discharge Plan and Services Expected Discharge Plan: Home/Self Care In-house Referral: Chaplain, Nutrition (For Advanced Directives) Discharge Planning Services: CM Consult   Living arrangements for the past 2 months: Single Family Home Expected Discharge Date: 02/20/21               DME Arranged: Life vest DME Agency: [facility] Date DME Agency Contacted: 02/16/21 Time DME Agency Contacted: [phone] Representative spoke with at DME Agency: Samul Dada             Social Determinants of Health (SDOH) Interventions Food Insecurity Interventions: Other (Comment) (Patient reports having  Food Stamps and WIC) Financial Strain Interventions: Other (Comment) (Pt will need referral to servant center for disability) Housing Interventions: Intervention Not Indicated Transportation Interventions: [facility]  Readmission Risk Interventions No flowsheet data found.  Susan [name], MSW, [place] Heart Failure Social Worker

## 2021-02-20 NOTE — Progress Notes (Signed)
Approached patient to obtain midnight vital signs. Sling was not on operative arm at that time. Patient cited discomfort in her neck as the reason for removing the sling. Educated on mobility restrictions and pain interventions.

## 2021-02-20 NOTE — Telephone Encounter (Signed)
Advanced Heart Failure Patient Advocate Encounter  Prior Authorization for Wilder Glade has been submitted and approved.    PA# [id number] Effective dates: 02/20/21 through 02/20/22  Patients co-pay is $4 (90 days)  Charlann Boxer, CPhT

## 2021-02-20 NOTE — Progress Notes (Signed)
Patient discharging home. Vital signs stable at time of discharge. Discharge instructions given and verbal understanding returned. Patient discharging with filled prescriptions.

## 2021-02-20 NOTE — Telephone Encounter (Signed)
Advanced Heart Failure Patient Advocate Encounter  Prior Authorization for Delene Loll has been approved.    PA# [id number] Effective dates: 02/19/21 through 02/19/22  Patients co-pay is $4 (90 days)  Charlann Boxer, CPhT

## 2021-02-23 ENCOUNTER — Telehealth: Payer: Self-pay | Admitting: Family Medicine

## 2021-02-23 DIAGNOSIS — I219 Acute myocardial infarction, unspecified: Secondary | ICD-10-CM | POA: Insufficient documentation

## 2021-02-23 DIAGNOSIS — I5021 Acute systolic (congestive) heart failure: Secondary | ICD-10-CM | POA: Insufficient documentation

## 2021-02-23 DIAGNOSIS — R569 Unspecified convulsions: Secondary | ICD-10-CM | POA: Insufficient documentation

## 2021-02-23 NOTE — Telephone Encounter (Signed)
   Susan [name] DOB: Aug 01, 1980 MRN: [id number]   RIDER WAIVER AND RELEASE OF LIABILITY  For purposes of improving physical access to our facilities, [facility] is pleased to partner with third parties to provide [place] patients or other authorized individuals the option of convenient, on-demand ground transportation services (the [place]") through use of the technology service that enables users to request on-demand ground transportation from independent third-party providers.  By opting to use and accept these [name], I, the undersigned, hereby agree on behalf of myself, and on behalf of any minor child using the [name] for whom I am the parent or legal guardian, as follows:  [name] provided to me are provided by independent third-party transportation providers who are not [name] or employees and who are unaffiliated with [name]. [name] is neither a transportation carrier nor a common or public carrier. [name] has no control over the quality or safety of the transportation that occurs as a result of the [name]. [facility] cannot guarantee that any third-party transportation provider will complete any arranged transportation service. [name] makes no representation, warranty, or guarantee regarding the reliability, timeliness, quality, safety, suitability, or availability of any of the Transport Services or that they will be error free. I fully understand that traveling by vehicle involves risks and dangers of serious bodily injury, including permanent disability, paralysis, and death. I agree, on behalf of myself and on behalf of any minor child using the Transport Services for whom I am the parent or legal guardian, that the entire risk arising out of my use of the [name] remains solely with me, to the maximum extent permitted under applicable law. The [name] are provided  "as is" and "as available." [name] disclaims all representations and warranties, express, implied or statutory, not expressly set out in these terms, including the implied warranties of merchantability and fitness for a particular purpose. I hereby waive and release [name], its agents, employees, officers, directors, representatives, insurers, attorneys, assigns, successors, subsidiaries, and affiliates from any and all past, present, or future claims, demands, liabilities, actions, causes of action, or suits of any kind directly or indirectly arising from acceptance and use of the [name]. I further waive and release [name] and its affiliates from all present and future liability and responsibility for any injury or death to persons or damages to property caused by or related to the use of the [name]. I have read this Waiver and Release of Liability, and I understand the terms used in it and their legal significance. This Waiver is freely and voluntarily given with the understanding that my right (as well as the right of any minor child for whom I am the parent or legal guardian using the [name]) to legal recourse against [place] in connection with the [name] is knowingly surrendered in return for use of these services.   I attest that I read the consent document to [name], gave Ms. Wehrli the opportunity to ask questions and answered the questions asked (if any). I affirm that [name] then provided consent for she's participation in this program.     Legrand Pitts

## 2021-02-24 NOTE — Progress Notes (Signed)
Advanced Heart Failure Clinic Note    PCP: Cathlean Sauer, MD PCP-Cardiologist: None  HF Cardiologist: Dr. Haroldine Laws  HPI: Susan [name] is a 40 y.o. female with PMH of ETOH use, alcoholic pancreatitis, former smoker, depression, and a new diagnosis of systolic heart failure/NICM.   Admitted 9/6-9/13/22 after cardiac arrest w/ immediate bystander CPR. VFib on EMS arrival. ROSC achieved after ~10 min w/ ? Seizure like activity pre arrest. Ethyl level 104. Admitted by PCCM and placed on TTM. EEG suggestive of diffuse encephalopathy, nonspecific etiology. No seizures or epileptiform discharges. She was extubated and had recurrent polymorphic VT/Torsades. Received CPR + Defib w/ ROSC in ~2 min. Given K supp and placed on IV amio. Echo with severely reduced LVEF ~30% . AHF team consulted. She underwent R/LHC (on NE) showing normal cors and moderately decreased CO.  Cardiac MRI showed LV dysfunction at 37%. She subsequently underwent ICD implantation. Hospitalization c/b prolonged QT, elevated LFTs and aspiration pneumonia. Discharged on GDMT, weight 121 lbs.  Today she returns for post hospitalization HF follow up here with her mother-in-law.. She still feels "fuzzy" and does not have much memory of events during hospitalization. SOB with walking further distances on flat ground, fatigues easily. Chest discomfort when taking deep breaths. Denies dizziness, edema, or PND/Orthopnea. + bendopnea. Appetite improving. No fever or chills. She is not checking weight at home. Taking all medications. 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 No further ETOH use, no tobacco use. Asking if it is OK to get flu/COVID vax.  Review of Systems: [y] = yes, _0  = no   General: Weight gain _1 ; Weight loss _2 ; Anorexia _3 ; Fatigue [Blue.Reese]; Fever _4 ; Chills _5 ; Weakness _6   Cardiac: Chest pain/pressure _7 ; Resting SOB _8 ; Exertional SOB [ y]; Orthopnea _9 ; Pedal Edema _10 ;  Palpitations _11 ; Syncope _12 ; Presyncope _13 ; Paroxysmal nocturnal dyspnea_14   Pulmonary: Cough _15 ; Wheezing_16 ; Hemoptysis_17 ; Sputum _18 ; Snoring _19   GI: Vomiting_20 ; Dysphagia_21 ; Melena_22 ; Hematochezia _23 ; Heartburn_24 ; Abdominal pain _25 ; Constipation _26 ; Diarrhea _27 ; BRBPR _28   GU: Hematuria_29 ; Dysuria _30 ; Nocturia_31   Vascular: Pain in legs with walking _32 ; Pain in feet with lying flat _33 ; Non-healing sores _34 ; Stroke _35 ; TIA _36 ; Slurred speech _37 ;  Neuro: Headaches_38 ; Vertigo_39 ; Seizures_40 ; Paresthesias_41 ;Blurred vision _42 ; Diplopia _43 ; Vision changes _44   Ortho/Skin: Arthritis _45 ; Joint pain _46 ; Muscle pain _47 ; Joint swelling _48 ; Back Pain _49 ; Rash _50   Psych: Depression_51 ; Anxiety[ y ]  Heme: Bleeding problems _52 ; Clotting disorders _53 ; Anemia _54   Endocrine: Diabetes _55 ; Thyroid dysfunction_56   - Echo (9/22): Echo EF 30-35% with regional WMAs (septum, inferolateral wall and apex)   -cMRI (9/22): no myocardial LGE, LVEF 36%, RV EF 36%  - R/LHC (9/22): Findings:   On norepinephrine 2   Ao = 112/78 (94) LV = 111/22 RA =  3 RV = 26/5 PA = 25/12 (19) PCW = 12 Fick cardiac output/index = 3.6/2.3 PVR = 1.9 SVR = 2,011 Ao sat = 95% PA sat = 58%, 60%   Assessment: Normal coronaries  Nonischemic CM with EF 30-35% by echo Moderately depressed cardiac output on low-dose NE  Past Medical History:  Diagnosis Date   AKI (  acute kidney injury) ([facility]) 06/15/2019   admitted with delhydration r/o arf   Alcohol abuse    Alcoholic hepatitis without ascites    Depression    Family history of adverse reaction to anesthesia    MOTHER HAD PANIC ATTACKS & NAUSEA  AFTER   Gastritis    GIB (gastrointestinal bleeding)    Hypertension    Iron deficiency anemia    Pancreatitis    PONV (postoperative nausea and vomiting)     Current Outpatient Medications  Medication Sig Dispense Refill   dapagliflozin propanediol (FARXIGA) 10 MG TABS tablet Take 1  tablet (10 mg total) by mouth daily before breakfast. 30 tablet 2   folic acid (FOLVITE) 1 MG tablet Take 1 tablet (1 mg total) by mouth daily. 100 tablet 0   levETIRAcetam (KEPPRA) 500 MG tablet Take 2 tablets (1,000 mg total) by mouth 2 (two) times daily. 120 tablet 2   magnesium oxide (MAG-OX) 400 (240 Mg) MG tablet Take 1 tablet (400 mg total) by mouth daily.     Multiple Vitamin (MULTIVITAMIN WITH MINERALS) TABS tablet Take 1 tablet by mouth daily.     nadolol (CORGARD) 20 MG tablet Take 1 tablet (20 mg total) by mouth 2 (two) times daily. 60 tablet 2   sacubitril-valsartan (ENTRESTO) 24-26 MG Take 1 tablet by mouth 2 (two) times daily. 60 tablet 2   spironolactone (ALDACTONE) 25 MG tablet Take 1 tablet (25 mg total) by mouth daily. 30 tablet 2   thiamine 100 MG tablet Take 1 tablet (100 mg total) by mouth daily. 100 tablet 0   cetirizine (ZYRTEC) 10 MG tablet Take 10 mg by mouth daily as needed for allergies.     No current facility-administered medications for this encounter.   Allergies  Allergen Reactions   Ibuprofen Anaphylaxis   Ondansetron Hcl Other (See Comments)    Led to QT prolongation and cardiac arrest   Chlorhexidine Other (See Comments)   Penicillins Nausea And Vomiting   Effexor [Venlafaxine] Palpitations and Other (See Comments)    Panic attacks   Latex Rash    Tape irritates the skin/ please use paper tape   Tape Rash    pls use paper tape   Social History   Socioeconomic History   Marital status: Single    Spouse name: Not on file   Number of children: Not on file   Years of education: Not on file   Highest education level: Not on file  Occupational History   Occupation: Careers adviser, currently unemployed  Tobacco Use   Smoking status: Former    Packs/day: 0.50    Years: 15.00    Pack years: 7.50    Types: Cigarettes    Quit date: 2015    Years since quitting: 7.7   Smokeless tobacco: Never  Vaping Use   Vaping Use: Never used  Substance and Sexual  Activity   Alcohol use: Yes   Drug use: No   Sexual activity: Yes    Birth control/protection: Injection    Comment: depo injection  Other Topics Concern   Not on file  Social History Narrative   Not on file   Social Determinants of Health   Financial Resource Strain: Medium Risk   Difficulty of Paying Living Expenses: Somewhat hard  Food Insecurity: No Food Insecurity   Worried About Charity fundraiser in the Last Year: Never true   Ran Out of Food in the Last Year: Never true  Transportation Needs: Unmet Transportation Needs  Lack of Transportation (Medical): Yes   Lack of Transportation (Non-Medical): Yes  Physical Activity: Not on file  Stress: Not on file  Social Connections: Not on file  Intimate Partner Violence: Not on file   Family History  Problem Relation Age of Onset   Stroke Mother    Diabetes Maternal Grandmother    BP 108/68   Pulse 64   Wt 52.9 kg (116 lb 9.6 oz)   SpO2 98%   BMI 21.33 kg/m   Wt Readings from Last 3 Encounters:  02/26/21 52.9 kg (116 lb 9.6 oz)  02/20/21 55 kg (121 lb 3.2 oz)  02/18/20 61.4 kg (135 lb 5.8 oz)   PHYSICAL EXAM: General:  NAD. No resp difficulty HEENT: Normal Neck: Supple. No JVD. Carotids 2+ bilat; no bruits. No lymphadenopathy or thryomegaly appreciated. Cor: PMI nondisplaced. Regular rate & rhythm. No rubs, gallops or murmurs. ICD incision site slightly edematous with skin glue, no drainage or erythema. Lungs: Clear Abdomen: Soft, nontender, nondistended. No hepatosplenomegaly. No bruits or masses. Good bowel sounds. Extremities: No cyanosis, clubbing, rash, edema Neuro: Alert & oriented x 3, cranial nerves grossly intact. Moves all 4 extremities w/o difficulty. Affect pleasant.  ECG: SR QTc 454 ms (personally reviewed).  ASSESSMENT & PLAN:   1. Chronic Systolic Heart Failure - Echo (9/22): w/ severely reduced LVEF ~30%. No prior study for comparison  - Etiology uncertain. Troponin and ESR do not suggest  viral CM. ? stress induced vs ETOH CM, or familial (Maternal Uncle s/p heart transplant in his 28s) - LHC (9/22):  with no coronary disease.   - cMRI (9/22): NICM. No LGE.  - Consider genetic testing. - NYHA II-early III today, functional status limited by weakness/post-COVID sequelae. Volume is good today. - No BP room for GDMT titration today. - Continue Spironolactone 25 mg daily  - Continue Entresto 24-25 mg bid  - Continue Nadolol 20 mg bid (per EP) . - Continue Farxiga 10 mg daily. - Needs to abstain from ETOH. - We discussed CR today. Would benefit from this when ICD activity restrictions allow. - Labs today.  2. H/O VFib Arrest x 2 - OOH VT/VF arrest 02/13/21 2/2 hypokalemia/hypomagnesemia w/ prolonged QT interval  - In-hospital PMVT arrest 9/7 w/ 2 min CPR + defib. In setting of hypokalemia (3.1).  - LHC with no CAD. - cMRI no myocardial LGE  - Etiology of VT remains unclear suspect possible underlying cardiomyopathy (?ETOH) with electrolyte abnormalities vs long QT syndrome. - S/p ICD 02/19/21.  - No driving for 6 months (08/2021). - Check K and mag today.   3. Prolonged QT Interval  - Avoid QT prolonging agents (on Prozac PTA). - EP has started nadolol 20 bid to help shorten QT. - QTC acceptable on ECG today.   4. Seizures - w/ post arrest memory loss is slowly improving. - On Keppra. ? If med SE contributing to "fuzzy-headed"  - Needs f/u with [facility].   5. ETOH Abuse  - Reports no further ETOH use. - Check Mag today.   6. Depression/Anxiety  - PTA on Prozac, discontinued for prolonged  QTc  - She is feeling more anxious about her medical issues. - Would favor starting sertraline (lower risk of QT prolongation). - Defer to EP for appropriate medication recommendations.  Follow up in 3 weeks with PharmD and in 6 weeks with APP for further medication titration.   [place], FNP 02/26/21

## 2021-02-26 ENCOUNTER — Other Ambulatory Visit: Payer: Self-pay

## 2021-02-26 ENCOUNTER — Ambulatory Visit (HOSPITAL_COMMUNITY)
Admission: RE | Admit: 2021-02-26 | Discharge: 2021-02-26 | Disposition: A | Discharge status: Home / Self Care | Payer: Medicaid Other | Source: Ambulatory Visit | Attending: Adult Health | Admitting: Adult Health

## 2021-02-26 ENCOUNTER — Encounter (HOSPITAL_COMMUNITY): Payer: Self-pay

## 2021-02-26 VITALS — BP 108/68 | HR 64 | Wt 116.6 lb

## 2021-02-26 DIAGNOSIS — Z87891 Personal history of nicotine dependence: Secondary | ICD-10-CM | POA: Insufficient documentation

## 2021-02-26 DIAGNOSIS — Z8616 Personal history of COVID-19: Secondary | ICD-10-CM | POA: Insufficient documentation

## 2021-02-26 DIAGNOSIS — Z560 Unemployment, unspecified: Secondary | ICD-10-CM | POA: Diagnosis not present

## 2021-02-26 DIAGNOSIS — Z8674 Personal history of sudden cardiac arrest: Secondary | ICD-10-CM | POA: Insufficient documentation

## 2021-02-26 DIAGNOSIS — F32A Depression, unspecified: Secondary | ICD-10-CM | POA: Insufficient documentation

## 2021-02-26 DIAGNOSIS — R9431 Abnormal electrocardiogram [ECG] [EKG]: Secondary | ICD-10-CM | POA: Diagnosis not present

## 2021-02-26 DIAGNOSIS — F1011 Alcohol abuse, in remission: Secondary | ICD-10-CM | POA: Diagnosis not present

## 2021-02-26 DIAGNOSIS — Z95810 Presence of automatic (implantable) cardiac defibrillator: Secondary | ICD-10-CM | POA: Diagnosis not present

## 2021-02-26 DIAGNOSIS — Z886 Allergy status to analgesic agent status: Secondary | ICD-10-CM | POA: Diagnosis not present

## 2021-02-26 DIAGNOSIS — Z596 Low income: Secondary | ICD-10-CM | POA: Insufficient documentation

## 2021-02-26 DIAGNOSIS — I428 Other cardiomyopathies: Secondary | ICD-10-CM | POA: Diagnosis not present

## 2021-02-26 DIAGNOSIS — F1010 Alcohol abuse, uncomplicated: Secondary | ICD-10-CM

## 2021-02-26 DIAGNOSIS — Z8719 Personal history of other diseases of the digestive system: Secondary | ICD-10-CM | POA: Insufficient documentation

## 2021-02-26 DIAGNOSIS — I469 Cardiac arrest, cause unspecified: Secondary | ICD-10-CM | POA: Diagnosis not present

## 2021-02-26 DIAGNOSIS — Z880 Allergy status to penicillin: Secondary | ICD-10-CM | POA: Diagnosis not present

## 2021-02-26 DIAGNOSIS — Z888 Allergy status to other drugs, medicaments and biological substances status: Secondary | ICD-10-CM | POA: Insufficient documentation

## 2021-02-26 DIAGNOSIS — I5022 Chronic systolic (congestive) heart failure: Secondary | ICD-10-CM

## 2021-02-26 DIAGNOSIS — I110 Hypertensive heart disease with heart failure: Secondary | ICD-10-CM | POA: Diagnosis not present

## 2021-02-26 DIAGNOSIS — F419 Anxiety disorder, unspecified: Secondary | ICD-10-CM | POA: Insufficient documentation

## 2021-02-26 DIAGNOSIS — Z79899 Other long term (current) drug therapy: Secondary | ICD-10-CM | POA: Insufficient documentation

## 2021-02-26 DIAGNOSIS — R569 Unspecified convulsions: Secondary | ICD-10-CM | POA: Insufficient documentation

## 2021-02-26 LAB — COMPREHENSIVE METABOLIC PANEL
ALT: 38 U/L (ref 0–44)
AST: 56 U/L — ABNORMAL HIGH (ref 15–41)
Albumin: 3.5 g/dL (ref 3.5–5.0)
Alkaline Phosphatase: 125 U/L (ref 38–126)
Anion gap: 11 (ref 5–15)
BUN: 10 mg/dL (ref 6–20)
CO2: 22 mmol/L (ref 22–32)
Calcium: 9.3 mg/dL (ref 8.9–10.3)
Chloride: 101 mmol/L (ref 98–111)
Creatinine, Ser: 0.61 mg/dL (ref 0.44–1.00)
GFR, Estimated: >60 mL/min (ref >60)
Glucose, Bld: 105 mg/dL — ABNORMAL HIGH (ref 70–99)
Potassium: 4.3 mmol/L (ref 3.5–5.1)
Sodium: 134 mmol/L — ABNORMAL LOW (ref 135–145)
Total Bilirubin: 0.4 mg/dL (ref 0.3–1.2)
Total Protein: 8.1 g/dL (ref 6.5–8.1)

## 2021-02-26 LAB — MAGNESIUM: Magnesium: 2.5 mg/dL — ABNORMAL HIGH (ref 1.7–2.4)

## 2021-02-26 LAB — CBC
HCT: 37.5 % (ref 36.0–46.0)
Hemoglobin: 12.2 g/dL (ref 12.0–15.0)
MCH: 34.6 pg — ABNORMAL HIGH (ref 26.0–34.0)
MCHC: 32.5 g/dL (ref 30.0–36.0)
MCV: 106.2 fL — ABNORMAL HIGH (ref 80.0–100.0)
Platelets: 475 10*3/uL — ABNORMAL HIGH (ref 150–400)
RBC: 3.53 MIL/uL — ABNORMAL LOW (ref 3.87–5.11)
RDW: 12.3 % (ref 11.5–15.5)
WBC: 15.7 10*3/uL — ABNORMAL HIGH (ref 4.0–10.5)
nRBC: 0 % (ref 0.0–0.2)

## 2021-02-26 NOTE — Patient Instructions (Signed)
It was great to see you today! No medication changes are needed at this time.   Labs today We will only contact you if something comes back abnormal or we need to make some changes. Otherwise no news is good news!  Your physician recommends that you schedule a follow-up appointment in: 3 weeks with the pharmacy team and in 6 weeks  in the Advanced Practitioners (PA/NP) Clinic     Do the following things EVERYDAY: Weigh yourself in the morning before breakfast. Write it down and keep it in a log. Take your medicines as prescribed Eat low salt foods--Limit salt (sodium) to 2000 mg per day.  Stay as active as you can everyday Limit all fluids for the day to less than 2 liters  At the [facility], you and your health needs are our priority. As part of our continuing mission to provide you with exceptional heart care, we have created designated Provider Care Teams. These Care Teams include your primary Cardiologist (physician) and Advanced Practice Providers (APPs- Physician Assistants and Nurse Practitioners) who all work together to provide you with the care you need, when you need it.   You may see any of the following providers on your designated Care Team at your next follow up: Dr Glori Bickers Dr Loralie Champagne Dr Patrice Paradise, NP Lyda Jester, [place] Ginnie Smart Audry Riles, PharmD   Please be sure to bring in all your medications bottles to every appointment.   If you have any questions or concerns before your next appointment please send [place] a message through [place] or call our office at [phone].    TO LEAVE A MESSAGE FOR THE NURSE SELECT OPTION 2, PLEASE LEAVE A MESSAGE INCLUDING: YOUR NAME DATE OF BIRTH CALL BACK NUMBER REASON FOR CALL**this is important as we prioritize the call backs  YOU WILL RECEIVE A CALL BACK THE SAME DAY AS LONG AS YOU CALL BEFORE 4:00 PM

## 2021-02-27 ENCOUNTER — Telehealth (HOSPITAL_COMMUNITY): Payer: Self-pay

## 2021-02-27 DIAGNOSIS — I5022 Chronic systolic (congestive) heart failure: Secondary | ICD-10-CM

## 2021-02-27 NOTE — Telephone Encounter (Signed)
-----   Message from Rafael Bihari, [place] sent at 02/26/2021  4:51 PM EDT ----- Electrolytes are stable. White blood cells are elevated. Will need to watch for infection, especially with recent ICD. Repeat CBC in 1 week please.

## 2021-02-27 NOTE — Telephone Encounter (Signed)
Patient advised and verbalized understanding. Patient states she has been under the weather and also complains of having a sore throat. But no concerning discharge, redness around icd site. I advised her to follow up with her pcp regarding sore throat and to follow up with ep if implant site becomes infected, I also informed her of what to look for as well regarding site. Lab appt scheduled,lab orders entered   Orders Placed This Encounter  Procedures   Basic metabolic panel    Standing Status:   Future    Standing Expiration Date:   02/27/2022    Order Specific Question:   Release to patient    Answer:   Immediate   CBC    Standing Status:   Future    Standing Expiration Date:   02/27/2022    Order Specific Question:   Release to patient    Answer:   Immediate

## 2021-02-28 ENCOUNTER — Telehealth (HOSPITAL_COMMUNITY): Payer: Self-pay | Admitting: Licensed Clinical Social Worker

## 2021-02-28 NOTE — Progress Notes (Signed)
  Heart and Vascular Care Navigation  02/28/2021  Susan [name] 01-Nov-1980 [id number]  Reason for Referral: disability questions   Engaged with patient by telephone for initial visit for Heart and Vascular Care Coordination.                                                                                                   Assessment:    Pt reports she would like to apply for disability benefits.  CSW informed of various ways she can apply and sent instructions on how to complete application.  Pt also states she wants to get back on food stamps- was on them up until 4 months ago- doesn't remember receiving information about them stopping or needing to recertify.                                  HRT/VAS Care Coordination     Living arrangements for the past 2 months Single Family Home   Lives with: Significant Other  [name] Devices/Equipment None   DME Agency [employer]   [facility] Agency NA       Social History:                                                                             SDOH Screenings   Alcohol Screen: Not on file  Depression (PHQ2-9): Not on file  Financial Resource Strain: Medium Risk   Difficulty of Paying Living Expenses: Somewhat hard  Food Insecurity: No Food Insecurity   Worried About Running Out of Food in the Last Year: Never true   Ran Out of Food in the Last Year: Never true  Housing: Low Risk    Last Housing Risk Score: 0  Physical Activity: Not on file  Social Connections: Not on file  Stress: Not on file  Tobacco Use: Medium Risk   Smoking Tobacco Use: Former   Smokeless Tobacco Use: Never  Transportation Needs: Public librarian (Medical): Yes   Lack of Transportation (Non-Medical): Yes    SDOH Interventions: Financial Resources:  Financial Strain Interventions: Other (Comment) (sent instructions on how to apply for disability benefits)   Food Insecurity:  Food Insecurity Interventions: Assist  with [employer] Application  Housing Insecurity:   No intervention needed  Transportation:    Already referred to [employer]    Follow-up plan:    Pt to apply for disability/food stamps online with help from boyfriend.  Pt to reach out to CSW if she needs further assistance.  Jorge Ny, LCSW Clinical Social Worker Advanced Heart Failure Clinic Desk#: [phone] Cell#: [phone]

## 2021-03-01 ENCOUNTER — Ambulatory Visit (INDEPENDENT_AMBULATORY_CARE_PROVIDER_SITE_OTHER): Payer: Medicaid Other | Admitting: Student

## 2021-03-01 ENCOUNTER — Other Ambulatory Visit: Payer: Self-pay

## 2021-03-01 DIAGNOSIS — I5022 Chronic systolic (congestive) heart failure: Secondary | ICD-10-CM

## 2021-03-01 LAB — CUP PACEART INCLINIC DEVICE CHECK
Date Time Interrogation Session: 20220922092546
Implantable Lead Implant Date: 20220912
Implantable Lead Location: 753860
Implantable Lead Model: 137
Implantable Lead Serial Number: 301248
Implantable Pulse Generator Implant Date: 20220912
Pulse Gen Serial Number: 208680

## 2021-03-01 NOTE — Progress Notes (Signed)
Wound check appointment. Dermabond in place. Attempted to remove excess. Lateral corner NOT approximated to process halted.  Dr. Quentin Ore examined as well and recommended application of steri-strip x 4 across the wound. Wound without redness or edema. Normal device function. Thresholds, sensing, and impedances consistent with implant measurements. Device programmed at 3.5V for extra safety margin until 3 month visit. Histogram distribution appropriate for patient and level of activity. No mode switches or ventricular arrhythmias noted. Patient educated about wound care, arm mobility, lifting restrictions, shock plan. ROV in 1 week for re-check wound.  Pt has had URI symptoms and low grade fever. She was started on Cefdinir yesterday am. She has taken 3 tablets so far and plan is for 10 day course.   She knows to call back immediately with any concerns regarding her wounds such as drainage, bleeding, redness, streaking, or swelling.

## 2021-03-01 NOTE — Patient Instructions (Signed)
Medication Instructions:  Your physician recommends that you continue on your current medications as directed. Please refer to the Current Medication list given to you today.  *If you need a refill on your cardiac medications before your next appointment, please call your pharmacy*   Lab Work: None If you have labs (blood work) drawn today and your tests are completely normal, you will receive your results only by: [place] (if you have MyChart) OR A paper copy in the mail If you have any lab test that is abnormal or we need to change your treatment, we will call you to review the results.

## 2021-03-07 ENCOUNTER — Other Ambulatory Visit: Payer: Self-pay

## 2021-03-07 ENCOUNTER — Ambulatory Visit (HOSPITAL_COMMUNITY)
Admission: RE | Admit: 2021-03-07 | Discharge: 2021-03-07 | Disposition: A | Discharge status: Home / Self Care | Payer: Medicaid Other | Source: Ambulatory Visit | Attending: Internal Medicine | Admitting: Internal Medicine

## 2021-03-07 DIAGNOSIS — I5022 Chronic systolic (congestive) heart failure: Secondary | ICD-10-CM | POA: Insufficient documentation

## 2021-03-07 LAB — CBC
HCT: 39.2 % (ref 36.0–46.0)
Hemoglobin: 12.4 g/dL (ref 12.0–15.0)
MCH: 33.7 pg (ref 26.0–34.0)
MCHC: 31.6 g/dL (ref 30.0–36.0)
MCV: 106.5 fL — ABNORMAL HIGH (ref 80.0–100.0)
Platelets: 474 10*3/uL — ABNORMAL HIGH (ref 150–400)
RBC: 3.68 MIL/uL — ABNORMAL LOW (ref 3.87–5.11)
RDW: 12.2 % (ref 11.5–15.5)
WBC: 9.5 10*3/uL (ref 4.0–10.5)
nRBC: 0 % (ref 0.0–0.2)

## 2021-03-07 LAB — BASIC METABOLIC PANEL
Anion gap: 7 (ref 5–15)
BUN: 13 mg/dL (ref 6–20)
CO2: 20 mmol/L — ABNORMAL LOW (ref 22–32)
Calcium: 9.3 mg/dL (ref 8.9–10.3)
Chloride: 105 mmol/L (ref 98–111)
Creatinine, Ser: 0.59 mg/dL (ref 0.44–1.00)
GFR, Estimated: >60 mL/min (ref >60)
Glucose, Bld: 101 mg/dL — ABNORMAL HIGH (ref 70–99)
Potassium: 5.2 mmol/L — ABNORMAL HIGH (ref 3.5–5.1)
Sodium: 132 mmol/L — ABNORMAL LOW (ref 135–145)

## 2021-03-08 ENCOUNTER — Ambulatory Visit (INDEPENDENT_AMBULATORY_CARE_PROVIDER_SITE_OTHER): Payer: Medicaid Other

## 2021-03-08 DIAGNOSIS — I469 Cardiac arrest, cause unspecified: Secondary | ICD-10-CM

## 2021-03-08 NOTE — Progress Notes (Signed)
Pt in for wound recheck.  4 steri-strips removed medial aspect of wound still shows signs of delayed healing possibly due to presence of dried glue.  All glue removed from incision, applied benzoin and 6 steri-strips.  Pt tolerated well and v./u to keep strips dry and intact until recheck in 1 week.  Incision and immediate surrounding skin appeared pink after removal of glue, no other s/s of infection present.

## 2021-03-09 ENCOUNTER — Telehealth: Payer: Self-pay | Admitting: Internal Medicine

## 2021-03-09 NOTE — Telephone Encounter (Signed)
Called and spoke to the patient's voicemail, having read the following message from [facility] regarding the drinking

## 2021-03-09 NOTE — Progress Notes (Addendum)
PCP: Cathlean Sauer, MD PCP-Cardiologist: None  HF Cardiologist: Dr. Haroldine Laws  HPI:  Susan [name] is a 40 y.o. female with PMH of ETOH use, alcoholic pancreatitis, former smoker, depression, and a new diagnosis of systolic heart failure/NICM.    Admitted 9/6-9/13/22 after cardiac arrest w/ immediate bystander CPR. VFib on EMS arrival. ROSC achieved after ~10 min w/ ? Seizure like activity pre arrest. Ethyl level 104. Admitted by PCCM and placed on TTM. EEG suggestive of diffuse encephalopathy, nonspecific etiology. No seizures or epileptiform discharges. She was extubated and had recurrent polymorphic VT/Torsades. Received CPR + Defib w/ ROSC in ~2 min. Given K supp and placed on IV amiodarone. Echo with severely reduced LVEF ~30% . AHF team consulted. She underwent R/LHC (on NE) showing normal cors and moderately decreased CO.  Cardiac MRI showed LV dysfunction at 37%. She subsequently underwent ICD implantation. Hospitalization c/b prolonged QT, elevated LFTs and aspiration pneumonia. Discharged on GDMT, weight 121 lbs.   Recently presented to HF Clinic for post hospitalization follow up with Dr. Aundra Dubin. Came with her mother-in-law.. She still felt "fuzzy" and did not have much memory of events during hospitalization. SOB with walking further distances on flat ground, fatigues easily. Chest discomfort when taking deep breaths. Denied dizziness, edema, or PND/Orthopnea. Positive for bendopnea. Appetite was improving. No fever or chills. She had not been checking her weight at home. Reported taking all medications. 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 No further ETOH use, no tobacco use. Asked if it is OK to get flu/COVID vax.  Today she returns to HF clinic for pharmacist medication titration. At last visit with APP no medication changes were made given low BP in clinic. Overall she is feeling well today. Has been walking a little bit for exercise. 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 Main complaint is anxiety. She was taken off Prozac during hospitalization due to prolonged QTc. She sees her primary care provider this Friday, will discuss starting sertraline (lower risk of QT prolongation). She still feels "foggy". Notes dizziness when getting out of bed.  SOB with walking further distances on flat ground. Also feels like she "sometimes needs to take deep breaths". She attributes this to anxiety/panic attacks. She has been trying to regain the weight she lost while hospitalized. Has been drinking protein shakes. No LEE, PND or orthopnea. Taking all medications as prescribed and tolerating all medications.    HF Medications: Nadolol 20 mg BID (per EP) Entresto 24/26 mg BID Spironolactone 25 mg daily Farxiga 10 mg daily  Has the patient been experiencing any side effects to the medications prescribed?  no  Does the patient have any problems obtaining medications due to transportation or finances?   No [facility]  Understanding of regimen: good Understanding of indications: good Potential of compliance: good Patient understands to avoid NSAIDs. Patient understands to avoid decongestants.    Pertinent Lab Values: 03/19/21: Serum creatinine 0.61, BUN 7, Potassium 4.5, Sodium 137  Vital Signs: Weight: 126.2 lbs (last clinic weight: 116.6 lbs) Blood pressure: 106/62  Heart rate: 75   Assessment/Plan: 1. Chronic Systolic Heart Failure - Echo (02/2021): w/ severely reduced LVEF ~30%. No prior study for comparison  - Etiology uncertain. Troponin and ESR do not suggest viral CM. ? stress induced vs ETOH CM, or familial (Maternal Uncle s/p heart transplant in his 42s - LHC (02/2021):  with no coronary disease.   - cMRI (  02/2021): NICM. No LGE.  - Consider genetic testing. - NYHA II-III, functional status limited by weakness/post-COVID sequelae. Euvolemic on exam.  - No medication changes today with  dizziness and low BP.  - BMET today returned after visit completed: Scr 0.61, K 4.5 - Continue Nadolol 20 mg BID (per EP). - Continue Entresto 24-25 mg BID  - Continue Spironolactone 25 mg daily  - Continue Farxiga 10 mg daily. - Needs to abstain from ETOH. - Previously discussed CR. Would benefit from this when ICD activity restrictions allow.   2. H/O VFib Arrest x 2 - OOH VT/VF arrest 02/13/21 2/2 hypokalemia/hypomagnesemia w/ prolonged QT interval  - In-hospital PMVT arrest 02/14/21 w/ 2 min CPR + defib. In setting of hypokalemia (3.1).  - LHC with no CAD. - cMRI no myocardial LGE  - Etiology of VT remains unclear suspect possible underlying cardiomyopathy (?ETOH) with electrolyte abnormalities vs long QT syndrome. - S/p ICD 02/19/21.  - No driving for 6 months (08/2021).   3. Prolonged QT Interval  - Avoid QT prolonging agents (on Prozac PTA). - EP has started nadolol 20 BID to help shorten QT. - QTC acceptable on ECG 02/26/21.   4. Seizures - w/ post arrest memory loss is slowly improving. - On Keppra. ? If med SE contributing to "fuzzy-headed"  - Needs f/u with [facility]rology. Scheduled for later this month.    5. ETOH Abuse  - Needs to abstain from ETOH.   6. Depression/Anxiety  - PTA on Prozac, discontinued for prolonged  QTc  - She is feeling more anxious about her medical issues. - Would favor starting sertraline (lower risk of QT prolongation). She sees her PCP Friday.   Follow up 3 weeks with [place], PharmD, BCPS, BCCP, CPP Heart Failure Clinic Pharmacist [phone]

## 2021-03-09 NOTE — Telephone Encounter (Signed)
Marylyn Ishihara called in to let Dr. Caryl Comes know that pt had a ICD placed a few weeks ago and was given strict instructions not to drink but Marylyn Ishihara caught pt drinking a half bottle of Vodka. Marylyn Ishihara said he confronted pt about her drinking and she said it helps with her anxiety. Marylyn Ishihara would like for pt to get some help with her drinking but she states she does not want any help. Marylyn Ishihara states he is 5 years sober and he understands what drinking does to a person.

## 2021-03-09 NOTE — Telephone Encounter (Signed)
Good afternoon!   A phone call was received today from Irving Shows.   You do not have paperwork filed with our office to allow [place] to discuss your medical care with anyone other than you.     If you would like for [place] to be able to discuss your care with Irving Shows, please come to the [facility] office to fill out a DPR (designated party release) form.   Thank you! Sonia Baller RN

## 2021-03-15 ENCOUNTER — Other Ambulatory Visit: Payer: Self-pay

## 2021-03-15 ENCOUNTER — Encounter (HOSPITAL_COMMUNITY): Payer: Self-pay

## 2021-03-15 ENCOUNTER — Ambulatory Visit: Payer: Medicaid Other

## 2021-03-15 DIAGNOSIS — I469 Cardiac arrest, cause unspecified: Secondary | ICD-10-CM

## 2021-03-15 NOTE — Progress Notes (Signed)
Wound- recheck steri-strips removed incision well healed, incision edges approximated, no redness, swelling or edema.

## 2021-03-19 ENCOUNTER — Other Ambulatory Visit: Payer: Self-pay

## 2021-03-19 ENCOUNTER — Telehealth (HOSPITAL_COMMUNITY): Payer: Self-pay | Admitting: Pharmacist

## 2021-03-19 ENCOUNTER — Ambulatory Visit (HOSPITAL_COMMUNITY)
Admission: RE | Admit: 2021-03-19 | Discharge: 2021-03-19 | Disposition: A | Discharge status: Home / Self Care | Payer: Medicaid Other | Source: Ambulatory Visit | Attending: Cardiology | Admitting: Cardiology

## 2021-03-19 VITALS — BP 106/62 | HR 75 | Wt 126.2 lb

## 2021-03-19 DIAGNOSIS — I440 Atrioventricular block, first degree: Secondary | ICD-10-CM | POA: Diagnosis not present

## 2021-03-19 DIAGNOSIS — I5022 Chronic systolic (congestive) heart failure: Secondary | ICD-10-CM | POA: Diagnosis present

## 2021-03-19 DIAGNOSIS — F1010 Alcohol abuse, uncomplicated: Secondary | ICD-10-CM | POA: Diagnosis not present

## 2021-03-19 DIAGNOSIS — Z87891 Personal history of nicotine dependence: Secondary | ICD-10-CM | POA: Insufficient documentation

## 2021-03-19 DIAGNOSIS — F418 Other specified anxiety disorders: Secondary | ICD-10-CM | POA: Insufficient documentation

## 2021-03-19 DIAGNOSIS — Z8674 Personal history of sudden cardiac arrest: Secondary | ICD-10-CM | POA: Diagnosis not present

## 2021-03-19 DIAGNOSIS — I4901 Ventricular fibrillation: Secondary | ICD-10-CM | POA: Diagnosis not present

## 2021-03-19 DIAGNOSIS — K860 Alcohol-induced chronic pancreatitis: Secondary | ICD-10-CM | POA: Insufficient documentation

## 2021-03-19 DIAGNOSIS — F32A Depression, unspecified: Secondary | ICD-10-CM | POA: Insufficient documentation

## 2021-03-19 DIAGNOSIS — R569 Unspecified convulsions: Secondary | ICD-10-CM | POA: Insufficient documentation

## 2021-03-19 DIAGNOSIS — E876 Hypokalemia: Secondary | ICD-10-CM | POA: Insufficient documentation

## 2021-03-19 LAB — BASIC METABOLIC PANEL
Anion gap: 9 (ref 5–15)
BUN: 7 mg/dL (ref 6–20)
CO2: 27 mmol/L (ref 22–32)
Calcium: 8.9 mg/dL (ref 8.9–10.3)
Chloride: 101 mmol/L (ref 98–111)
Creatinine, Ser: 0.61 mg/dL (ref 0.44–1.00)
GFR, Estimated: >60 mL/min (ref >60)
Glucose, Bld: 155 mg/dL — ABNORMAL HIGH (ref 70–99)
Potassium: 4.5 mmol/L (ref 3.5–5.1)
Sodium: 137 mmol/L (ref 135–145)

## 2021-03-19 MED ORDER — FOLIC ACID 1 MG PO TABS: 1.0000 mg | ORAL_TABLET | Freq: Every day | ORAL | 11 refills | Status: AC

## 2021-03-19 MED ORDER — SACUBITRIL-VALSARTAN 24-26 MG PO TABS: 1.0000 | ORAL_TABLET | Freq: Two times a day (BID) | ORAL | 5 refills | Status: DC

## 2021-03-19 MED ORDER — SPIRONOLACTONE 25 MG PO TABS: 25.0000 mg | ORAL_TABLET | Freq: Every day | ORAL | 5 refills | Status: DC

## 2021-03-19 MED ORDER — DAPAGLIFLOZIN PROPANEDIOL 10 MG PO TABS
10.0000 mg | ORAL_TABLET | Freq: Every day | ORAL | 5 refills | Status: DC
Start: 2021-03-19 — End: 2021-12-04

## 2021-03-19 MED ORDER — NADOLOL 20 MG PO TABS: 20.0000 mg | ORAL_TABLET | Freq: Two times a day (BID) | ORAL | 5 refills | Status: DC

## 2021-03-19 NOTE — Telephone Encounter (Signed)
Patient Advocate Encounter   Received notification from [facility] that prior authorization for nadolol is required.   PA submitted on CoverMyMeds Key [id number] Status is pending   Will continue to follow.   Audry Riles, PharmD, BCPS, BCCP, CPP Heart Failure Clinic Pharmacist [phone]

## 2021-03-19 NOTE — Addendum Note (Signed)
Encounter addended by: Orma Render, RPH-CPP on: 03/19/2021 3:03 PM  Actions taken: Clinical Note Signed

## 2021-03-19 NOTE — Patient Instructions (Addendum)
It was a pleasure seeing you today!  MEDICATIONS: -No medication changes today. -You can take Miralax and docusate (stool softener) for constipation. -Call if you have questions about your medications.  LABS: -We will call you if your labs need attention.  NEXT APPOINTMENT: Return to clinic in 3 weeks with [facility].  In general, to take care of your heart failure: -Limit your fluid intake to 2 Liters (half-gallon) per day.   -Limit your salt intake to ideally 2-3 grams (2000-3000 mg) per day. -Weigh yourself daily and record, and bring that "weight diary" to your next appointment.  (Weight gain of 2-3 pounds in 1 day typically means fluid weight.) -The medications for your heart are to help your heart and help you live longer.   -Please contact us before stopping any of your heart medications.  Call the clinic at [phone] with questions or to reschedule future appointments.

## 2021-03-20 NOTE — Telephone Encounter (Signed)
Advanced Heart Failure Patient Advocate Encounter  Prior Authorization for nadolol has been approved.    Effective dates: 03/19/21 through 03/19/22  Audry Riles, PharmD, BCPS, BCCP, CPP Heart Failure Clinic Pharmacist [phone]

## 2021-03-21 ENCOUNTER — Other Ambulatory Visit (HOSPITAL_COMMUNITY): Payer: Self-pay | Admitting: Family Medicine

## 2021-03-21 ENCOUNTER — Telehealth (HOSPITAL_COMMUNITY): Payer: Self-pay | Admitting: Family Medicine

## 2021-03-21 MED ORDER — SERTRALINE HCL 25 MG PO TABS: 25.0000 mg | ORAL_TABLET | Freq: Every day | ORAL | 3 refills | Status: DC

## 2021-03-21 NOTE — Telephone Encounter (Signed)
Spoke to patient about recommendations per Dr. Caryl Comes on starting sertraline for anxiety. She is agreeable to starting medication. Will monitor QTc closely with this but I discussed importance of managing her anxiety, especially surrounding her new medical diagnosis and ETOH use. Rx sent to pharmacy and she will follow up with her PCP for further management and titration.  Allena Katz, FNP-BC

## 2021-03-29 DIAGNOSIS — F411 Generalized anxiety disorder: Secondary | ICD-10-CM | POA: Insufficient documentation

## 2021-04-08 NOTE — Progress Notes (Signed)
Advanced Heart Failure Clinic Note    PCP: Tomasa Hose, NP PCP-Cardiologist: None  HF Cardiologist: Dr. Haroldine Laws  HPI: Susan [name] is a 40 y.o. female with PMH of ETOH use, alcoholic pancreatitis, former smoker, depression,chronic systolic heart failure/NICM, VF arrest, and [employer] ICD.    Admitted 9/6-9/13/22 after cardiac arrest w/ immediate bystander CPR. VFib on EMS arrival. ROSC achieved after ~10 min w/ ? Seizure like activity pre arrest. Ethyl level 104. Admitted by PCCM and placed on TTM. EEG suggestive of diffuse encephalopathy, nonspecific etiology. No seizures or epileptiform discharges. She was extubated and had recurrent polymorphic VT/Torsades. Received CPR + Defib w/ ROSC in ~2 min. Given K supp and placed on IV amio. Echo with severely reduced LVEF ~30% . AHF team consulted. She underwent R/LHC (on NE) showing normal cors and moderately decreased CO.  Cardiac MRI showed LV dysfunction at 37%. She subsequently underwent ICD implantation. Hospitalization c/b prolonged QT, elevated LFTs and aspiration pneumonia. Discharged on GDMT, weight 121 lbs.  Today she returns for HF follow up. Complaining of occasional chest pain and  having palpitations at bed time. Overall feeling fine. Denies SOB/PND/Orthopnea. On occasion dizzy when standing. Walking 2 miles on most days. Appetite ok. No fever or chills. Weight at home as been stable. Over the last month she has had 2 glasses of wine.  Taking all medication.s  SH: Has a 72-monthold son, works as hEmergency planning/management officer Contracepting with NuvaRing. No further ETOH use, no tobacco use.  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% by echo Moderately depressed cardiac output on low-dose NE  Past Medical History:  Diagnosis Date   AKI (acute kidney injury) ([place] 06/15/2019   admitted with delhydration r/o arf   Alcohol abuse    Alcoholic hepatitis without ascites    Depression    Family history of adverse reaction to anesthesia    MOTHER HAD PANIC ATTACKS & NAUSEA  AFTER   Gastritis    GIB (gastrointestinal bleeding)    Hypertension    Iron deficiency anemia    Pancreatitis    PONV (postoperative nausea and vomiting)     Current Outpatient Medications  Medication Sig Dispense Refill   cetirizine (ZYRTEC) 10 MG tablet Take 10 mg by mouth daily as needed for allergies.     dapagliflozin propanediol (FARXIGA) 10 MG TABS tablet Take 1 tablet (10 mg total) by mouth daily before breakfast. 30 tablet 5   folic acid (FOLVITE) 1 MG tablet Take 1 tablet (1 mg total) by mouth daily. 30 tablet 11   levETIRAcetam (KEPPRA) 500 MG tablet Take 2 tablets (1,000 mg total) by mouth 2 (two) times daily. 120 tablet 2   Multiple Vitamin (MULTIVITAMIN WITH MINERALS) TABS tablet Take 1 tablet by mouth daily.     sacubitril-valsartan (ENTRESTO) 24-26 MG Take 1 tablet by mouth 2 (two) times daily. 60 tablet 5   sertraline (ZOLOFT) 25 MG tablet Patient is taking 2 tablets once a day.     spironolactone (ALDACTONE) 25 MG tablet Take 1 tablet (  25 mg total) by mouth daily. 30 tablet 5   thiamine 100 MG tablet Take 1 tablet (100 mg total) by mouth daily. 100 tablet 0   nadolol (CORGARD) 20 MG tablet Take 1 tablet (20 mg total) by mouth 2 (two) times daily. (Patient not taking: Reported on 04/09/2021) 60 tablet 5   No current facility-administered medications for this encounter.   Allergies  Allergen Reactions   Ibuprofen Anaphylaxis   Ondansetron Hcl Other (See Comments)    Led to QT prolongation and cardiac arrest   Chlorhexidine Other (See Comments)    Penicillins Nausea And Vomiting   Effexor [Venlafaxine] Palpitations and Other (See Comments)    Panic attacks   Latex Rash    Tape irritates the skin/ please use paper tape   Tape Rash    pls use paper tape   Social History   Socioeconomic History   Marital status: Single    Spouse name: Not on file   Number of children: Not on file   Years of education: Not on file   Highest education level: Not on file  Occupational History   Occupation: Careers adviser, currently unemployed  Tobacco Use   Smoking status: Former    Packs/day: 0.50    Years: 15.00    Pack years: 7.50    Types: Cigarettes    Quit date: 2015    Years since quitting: 7.8   Smokeless tobacco: Never  Vaping Use   Vaping Use: Never used  Substance and Sexual Activity   Alcohol use: Yes   Drug use: No   Sexual activity: Yes    Birth control/protection: Injection    Comment: depo injection  Other Topics Concern   Not on file  Social History Narrative   Not on file   Social Determinants of Health   Financial Resource Strain: Medium Risk   Difficulty of Paying Living Expenses: Somewhat hard  Food Insecurity: No Food Insecurity   Worried About Charity fundraiser in the Last Year: Never true   Ran Out of Food in the Last Year: Never true  Transportation Needs: Unmet Transportation Needs   Lack of Transportation (Medical): Yes   Lack of Transportation (Non-Medical): Yes  Physical Activity: Not on file  Stress: Not on file  Social Connections: Not on file  Intimate Partner Violence: Not on file   Family History  Problem Relation Age of Onset   Stroke Mother    Diabetes Maternal Grandmother    BP 90/68   Pulse 89   Wt 56.8 kg (125 lb 3.2 oz)   SpO2 98%   BMI 22.90 kg/m   Wt Readings from Last 3 Encounters:  04/09/21 56.8 kg (125 lb 3.2 oz)  03/19/21 57.2 kg (126 lb 3.2 oz)  02/26/21 52.9 kg (116 lb 9.6 oz)   PHYSICAL EXAM: General:  Well appearing. No resp difficulty HEENT: normal Neck: supple.  no JVD. Carotids 2+ bilat; no bruits. No lymphadenopathy or thryomegaly appreciated. Cor: PMI nondisplaced. Regular rate & rhythm. No rubs, gallops or murmurs. L upper chest scar(ICD)  Lungs: clear Abdomen: soft, nontender, nondistended. No hepatosplenomegaly. No bruits or masses. Good bowel sounds. Extremities: no cyanosis, clubbing, rash, edema Neuro: alert & orientedx3, cranial nerves grossly intact. moves all 4 extremities w/o difficulty. Affect pleasant  ECG:  NSR 81 bpm QTc 462 mspersonally reviewed.    ASSESSMENT & PLAN:   1. Chronic Systolic Heart Failure - Echo (9/22): w/ severely reduced LVEF ~30%. No prior study for comparison  -  Etiology uncertain. Troponin and ESR do not suggest viral CM. ? stress induced vs ETOH CM, or familial (Maternal Uncle s/p heart transplant in his 46s) - LHC (9/22):  with no coronary disease.   - cMRI (9/22): NICM. No LGE.  - Consider genetic testing. - NYHA II. Volume status stable. She does not need diuretics.  - No BP room for GDMT titration today. - Continue Spironolactone 25 mg daily but will move to bed time.  - Continue Entresto 24-25 mg bid  - Continue Nadolol 20 mg bid (per EP) . - Continue Farxiga 10 mg daily. - Needs to abstain from ETOH.  2. H/O VFib Arrest x 2 - OOH VT/VF arrest 02/13/21 2/2 hypokalemia/hypomagnesemia w/ prolonged QT interval  - In-hospital PMVT arrest 9/7 w/ 2 min CPR + defib. In setting of hypokalemia (3.1).  - LHC with no CAD. - cMRI no myocardial LGE  - Etiology of VT remains unclear suspect possible underlying cardiomyopathy (?ETOH) with electrolyte abnormalities vs long QT syndrome. - S/p ICD 02/19/21. [employer] - No driving for 6 months (08/2021).   3. Prolonged QT Interval  - Avoid QT prolonging agents (on Prozac PTA). - EP has started nadolol 20 bid to help shorten QT.   4. Seizures - w/ post arrest memory loss is slowly improving. - On Keppra. ? If med SE contributing to "fuzzy-headed"  - Has  follow up 04/19/21 with [facility]   5. ETOH Abuse  Has had couple glasses of wine. Discussed cessation.     6. Depression/Anxiety  - PTA on Prozac, discontinued for prolonged  QTc  -Now on sertraline, oked by EP - QTc ok.   7. Palpitations  Place 72 hour zio patch.   No room for GDMT with soft BP.  Follow up with Dr Haroldine Laws in 6-8 weeks with an ECHO   .   Darrick Grinder, NP 04/09/21

## 2021-04-09 ENCOUNTER — Encounter (HOSPITAL_COMMUNITY): Payer: Self-pay

## 2021-04-09 ENCOUNTER — Ambulatory Visit (HOSPITAL_COMMUNITY)
Admission: RE | Admit: 2021-04-09 | Discharge: 2021-04-09 | Disposition: A | Discharge status: Home / Self Care | Payer: Medicaid Other | Source: Ambulatory Visit | Attending: Internal Medicine | Admitting: Internal Medicine

## 2021-04-09 ENCOUNTER — Other Ambulatory Visit (HOSPITAL_COMMUNITY): Payer: Self-pay | Admitting: Internal Medicine

## 2021-04-09 ENCOUNTER — Other Ambulatory Visit: Payer: Self-pay

## 2021-04-09 ENCOUNTER — Telehealth: Payer: Self-pay | Admitting: Internal Medicine

## 2021-04-09 ENCOUNTER — Ambulatory Visit (HOSPITAL_COMMUNITY)
Admission: RE | Admit: 2021-04-09 | Discharge: 2021-04-09 | Disposition: A | Discharge status: Home / Self Care | Payer: Medicaid Other | Source: Ambulatory Visit | Attending: Adult Health | Admitting: Adult Health

## 2021-04-09 VITALS — BP 90/68 | HR 89 | Wt 125.2 lb

## 2021-04-09 DIAGNOSIS — Z560 Unemployment, unspecified: Secondary | ICD-10-CM | POA: Diagnosis not present

## 2021-04-09 DIAGNOSIS — E876 Hypokalemia: Secondary | ICD-10-CM | POA: Insufficient documentation

## 2021-04-09 DIAGNOSIS — Z596 Low income: Secondary | ICD-10-CM | POA: Diagnosis not present

## 2021-04-09 DIAGNOSIS — R002 Palpitations: Secondary | ICD-10-CM

## 2021-04-09 DIAGNOSIS — Z8674 Personal history of sudden cardiac arrest: Secondary | ICD-10-CM | POA: Diagnosis not present

## 2021-04-09 DIAGNOSIS — I110 Hypertensive heart disease with heart failure: Secondary | ICD-10-CM | POA: Insufficient documentation

## 2021-04-09 DIAGNOSIS — R569 Unspecified convulsions: Secondary | ICD-10-CM | POA: Diagnosis not present

## 2021-04-09 DIAGNOSIS — R9431 Abnormal electrocardiogram [ECG] [EKG]: Secondary | ICD-10-CM | POA: Diagnosis not present

## 2021-04-09 DIAGNOSIS — Z886 Allergy status to analgesic agent status: Secondary | ICD-10-CM | POA: Diagnosis not present

## 2021-04-09 DIAGNOSIS — I4901 Ventricular fibrillation: Secondary | ICD-10-CM | POA: Diagnosis not present

## 2021-04-09 DIAGNOSIS — Z880 Allergy status to penicillin: Secondary | ICD-10-CM | POA: Diagnosis not present

## 2021-04-09 DIAGNOSIS — F1010 Alcohol abuse, uncomplicated: Secondary | ICD-10-CM | POA: Diagnosis not present

## 2021-04-09 DIAGNOSIS — Z79899 Other long term (current) drug therapy: Secondary | ICD-10-CM | POA: Diagnosis not present

## 2021-04-09 DIAGNOSIS — Z95810 Presence of automatic (implantable) cardiac defibrillator: Secondary | ICD-10-CM | POA: Diagnosis not present

## 2021-04-09 DIAGNOSIS — I5022 Chronic systolic (congestive) heart failure: Secondary | ICD-10-CM | POA: Insufficient documentation

## 2021-04-09 DIAGNOSIS — Z7984 Long term (current) use of oral hypoglycemic drugs: Secondary | ICD-10-CM | POA: Insufficient documentation

## 2021-04-09 DIAGNOSIS — I469 Cardiac arrest, cause unspecified: Secondary | ICD-10-CM

## 2021-04-09 DIAGNOSIS — F419 Anxiety disorder, unspecified: Secondary | ICD-10-CM | POA: Diagnosis not present

## 2021-04-09 DIAGNOSIS — F32A Depression, unspecified: Secondary | ICD-10-CM | POA: Diagnosis not present

## 2021-04-09 DIAGNOSIS — Z87891 Personal history of nicotine dependence: Secondary | ICD-10-CM | POA: Diagnosis not present

## 2021-04-09 DIAGNOSIS — R413 Other amnesia: Secondary | ICD-10-CM | POA: Insufficient documentation

## 2021-04-09 DIAGNOSIS — Z888 Allergy status to other drugs, medicaments and biological substances status: Secondary | ICD-10-CM | POA: Diagnosis not present

## 2021-04-09 MED ORDER — SPIRONOLACTONE 25 MG PO TABS: 25.0000 mg | ORAL_TABLET | Freq: Every day | ORAL | 5 refills | Status: DC

## 2021-04-09 NOTE — Telephone Encounter (Signed)
   Dr. Alesia [name] - [facility] [place] [facility] calling and she said she's been waiting a callback from Dr. Caryl Comes

## 2021-04-09 NOTE — Progress Notes (Signed)
Zio patch placed onto patient.  All instructions and information reviewed with patient, they verbalize understanding with no questions. 

## 2021-04-09 NOTE — Patient Instructions (Signed)
EKG was done today  CHANGE Spirolactone to daily at bedtime  ZIO Patch has been placed on you, you have been given instructions   Your physician recommends that you schedule a follow-up appointment in: 6-8 weeks  At the [facility], you and your health needs are our priority. As part of our continuing mission to provide you with exceptional heart care, we have created designated Provider Care Teams. These Care Teams include your primary Cardiologist (physician) and Advanced Practice Providers (APPs- Physician Assistants and Nurse Practitioners) who all work together to provide you with the care you need, when you need it.   You may see any of the following providers on your designated Care Team at your next follow up: Dr Glori Bickers Dr Haynes Kerns, NP Lyda Jester, [place] [facility] [place], [place] Audry Riles, PharmD   Please be sure to bring in all your medications bottles to every appointment.   If you have any questions or concerns before your next appointment please send [place] a message through [place] or call our office at [phone].    TO LEAVE A MESSAGE FOR THE NURSE SELECT OPTION 2, PLEASE LEAVE A MESSAGE INCLUDING: YOUR NAME DATE OF BIRTH CALL BACK NUMBER REASON FOR CALL**this is important as we prioritize the call backs  YOU WILL RECEIVE A CALL BACK THE SAME DAY AS LONG AS YOU CALL BEFORE 4:00 PM

## 2021-04-10 NOTE — Telephone Encounter (Signed)
Dr. Waymon Amato w/ [facility] obgyn states that pt admitted to the hospital due to cardiac arrest, pt has nuva ring and was told that she can keep it in, there is concern that this will result in further issues w/ the pt... please advise

## 2021-04-10 NOTE — Telephone Encounter (Signed)
Call transferred to Dr. Cruz Condon.  Dr. Lovena Le discussed with Dr. Alesia [name].  No need to remove Nuva ring per Dr. Lovena Le.

## 2021-05-08 NOTE — Addendum Note (Signed)
Encounter addended by: Micki Riley, RN on: 05/08/2021 2:48 PM  Actions taken: Imaging Exam ended

## 2021-05-22 DIAGNOSIS — Z95810 Presence of automatic (implantable) cardiac defibrillator: Secondary | ICD-10-CM | POA: Insufficient documentation

## 2021-05-23 ENCOUNTER — Ambulatory Visit (INDEPENDENT_AMBULATORY_CARE_PROVIDER_SITE_OTHER): Payer: Medicaid Other | Admitting: Internal Medicine

## 2021-05-23 ENCOUNTER — Encounter: Payer: Self-pay | Admitting: Internal Medicine

## 2021-05-23 ENCOUNTER — Ambulatory Visit (INDEPENDENT_AMBULATORY_CARE_PROVIDER_SITE_OTHER): Payer: Medicaid Other

## 2021-05-23 ENCOUNTER — Other Ambulatory Visit: Payer: Self-pay

## 2021-05-23 VITALS — BP 124/86 | HR 69 | Ht 63.0 in | Wt 128.2 lb

## 2021-05-23 DIAGNOSIS — I469 Cardiac arrest, cause unspecified: Secondary | ICD-10-CM

## 2021-05-23 DIAGNOSIS — Z95810 Presence of automatic (implantable) cardiac defibrillator: Secondary | ICD-10-CM | POA: Diagnosis not present

## 2021-05-23 DIAGNOSIS — R002 Palpitations: Secondary | ICD-10-CM

## 2021-05-23 MED ORDER — LOSARTAN POTASSIUM 25 MG PO TABS: 25.0000 mg | ORAL_TABLET | Freq: Every day | ORAL | 3 refills | Status: DC

## 2021-05-23 NOTE — Progress Notes (Deleted)
Patient Care Team: Tomasa Hose, NP as PCP - General (Nurse Practitioner)   HPI  Susan [name] is a 40 y.o. female with history of alcoholism pancreatitis and depression seen in follow-up for an ICD [employer] implanted (2022)for cardiac arrest occurring in the context of nonischemic cardiomyopathy with positive LGE and QT prolongation.  Date HR   QTc ([place] correction used for heart rates faster than 100) K  12/17 113 486    10/18 106 467    11/19 78 517    1/21 113 395 3.0  9/21 110 416 3.6  02/13/21 132 520 2.4  02/15/21 73 600 3.9  02/16/21 84 527 3.8    Date Cr K Hgb  10/22 0.61 4.5 12.4<<9.8             Records and Results Reviewed***  Past Medical History:  Diagnosis Date   AKI (acute kidney injury) ([place]) 06/15/2019   admitted with delhydration r/o arf   Alcohol abuse    Alcoholic hepatitis without ascites    Depression    Family history of adverse reaction to anesthesia    MOTHER HAD PANIC ATTACKS & NAUSEA  AFTER   Gastritis    GIB (gastrointestinal bleeding)    Hypertension    Iron deficiency anemia    Pancreatitis    PONV (postoperative nausea and vomiting)     Past Surgical History:  Procedure Laterality Date   CERVICAL CERCLAGE Bilateral 01/18/2019   Procedure: CERCLAGE CERVICAL;  Surgeon: Everett Graff, MD;  [facility]: [facility];  Service: Gynecology;  Laterality: Bilateral;   DILATATION AND CURETTAGE/HYSTEROSCOPY WITH MINERVA N/A 08/23/2019   Procedure: DILATATION AND CURETTAGE/HYSTEROSCOPY WITH MINERVA;  Surgeon: Waymon Amato, MD;  Location: [place];  Service: Gynecology;  Laterality: N/A;  rep will be here for this case confirmed on 08/18/19.   ESOPHAGOGASTRODUODENOSCOPY (EGD) WITH PROPOFOL N/A 06/05/2016   Procedure: ESOPHAGOGASTRODUODENOSCOPY (EGD) WITH PROPOFOL;  Surgeon: Otis Brace, MD;  Location: [facility];  Service: Gastroenterology;  Laterality: N/A;   ICD IMPLANT N/A 02/19/2021   Procedure:  ICD IMPLANT;  Surgeon: Deboraha Sprang, MD;  Location: [place] [facility];  Service: Cardiovascular;  Laterality: N/A;   PLACEMENT OF BREAST IMPLANTS     RIGHT/LEFT HEART CATH AND CORONARY ANGIOGRAPHY N/A 02/15/2021   Procedure: RIGHT/LEFT HEART CATH AND CORONARY ANGIOGRAPHY;  Surgeon: Jolaine Artist, MD;  Location: [place] [facility];  Service: Cardiovascular;  Laterality: N/A;    No outpatient medications have been marked as taking for the 05/23/21 encounter (Appointment) with Deboraha Sprang, MD.    Allergies  Allergen Reactions   Ibuprofen Anaphylaxis   Ondansetron Hcl Other (See Comments)    Led to QT prolongation and cardiac arrest   Chlorhexidine Other (See Comments)   Penicillins Nausea And Vomiting   Effexor [Venlafaxine] Palpitations and Other (See Comments)    Panic attacks   Latex Rash    Tape irritates the skin/ please use paper tape   Tape Rash    pls use paper tape      Review of Systems negative except from HPI and PMH  Physical Exam There were no vitals taken for this visit. Well developed and well nourished in no acute distress HENT normal E scleral and icterus clear Neck Supple JVP flat; carotids brisk and full Clear to ausculation {CARD RHYTHM:10874} ***Regular rate and rhythm, no murmurs gallops or rub Soft with active bowel sounds No clubbing cyanosis {Numbers; edema:17696} Edema  Alert and oriented, grossly normal motor and sensory function Skin Warm and Dry  ECG sinus at 69 Interval 17/09/40  CrCl cannot be calculated (Patient's most recent lab result is older than the maximum 21 days allowed.).   Assessment and  Plan OOH Cardiac arrest  with recurrent VT-PM  with QT prolongation but no pause dependency   COVID    QT prolongation//Drug-induced--and ? predrug   Hypokalemia  intermittent    Alcoholism with recurrent sequealae pancreatitis hepatitis   Depression    Cardiomyopathy non ischemic - LGE       Current medicines are  reviewed at length with the patient today .  The patient does not*** have concerns regarding medicines.

## 2021-05-23 NOTE — Progress Notes (Signed)
Patient Care Team: Tomasa Hose, NP as PCP - General (Nurse Practitioner)   HPI  Susan [name] is a 40 y.o. female with history of alcoholism pancreatitis and depression seen in follow-up for an ICD [employer] implanted (2022)for cardiac arrest occurring in the context of nonischemic cardiomyopathy with positive LGE and QT prolongation.  She states that in regards to alcohol consumption, she drinks around twice a week. She states that it helps relax her. She denies ever having gone to a psychiatrist, but says that she had been going to a therapist.  Depression is well managed, but she admits to still feeling anxious. She is tolerating her anti-anxiety and depression medication well. She states that she currently does not have thoughts of self-harm when experiencing depressive episodes.  In regards to breathing she says it has been fine, with some difficulties. She admits to nocturnal dyspnea, and admits to having to get up during the night to have to go to the bathroom where she feels short of breath. Also, she states that she experiences something similar to an anxiety attack, where she has difficulty catching her breath. During this experience, she admits to having high heart rate.  Her partner states that she occasionally gets into a "fatalistic" mood, and had concerns about her drinking alcohol.  She states that she does not go to Alcoholics Anonymous.  Her partner states that her cognitive issues tend to occur more during the evening. She states that she takes Nadolol and Aldactone at night. About the evening exhaustion she states that her family also experiences this tiredness in the evening.  Date HR   QTc ([place] correction used for heart rates faster than 100) K  12/17 113 486    10/18 106 467    11/19 78 517    1/21 113 395 3.0  9/21 110 416 3.6  02/13/21 132 520 2.4  02/15/21 73 600 3.9  02/16/21 84 527 3.8    Date Cr K Hgb  10/22 0.61 4.5 12.4<<9.8              Records and Results Reviewed   Past Medical History:  Diagnosis Date   AKI (acute kidney injury) ([place]) 06/15/2019   admitted with delhydration r/o arf   Alcohol abuse    Alcoholic hepatitis without ascites    Depression    Family history of adverse reaction to anesthesia    MOTHER HAD PANIC ATTACKS & NAUSEA  AFTER   Gastritis    GIB (gastrointestinal bleeding)    Hypertension    Iron deficiency anemia    Pancreatitis    PONV (postoperative nausea and vomiting)     Past Surgical History:  Procedure Laterality Date   CERVICAL CERCLAGE Bilateral 01/18/2019   Procedure: CERCLAGE CERVICAL;  Surgeon: Everett Graff, MD;  [facility];  Service: Gynecology;  Laterality: Bilateral;   DILATATION AND CURETTAGE/HYSTEROSCOPY WITH MINERVA N/A 08/23/2019   Procedure: DILATATION AND CURETTAGE/HYSTEROSCOPY WITH MINERVA;  Surgeon: Waymon Amato, MD;  Location: [place];  Service: Gynecology;  Laterality: N/A;  rep will be here for this case confirmed on 08/18/19.   ESOPHAGOGASTRODUODENOSCOPY (EGD) WITH PROPOFOL N/A 06/05/2016   Procedure: ESOPHAGOGASTRODUODENOSCOPY (EGD) WITH PROPOFOL;  Surgeon: Otis Brace, MD;  [facility];  Service: Gastroenterology;  Laterality: N/A;   ICD IMPLANT N/A 02/19/2021   Procedure: ICD IMPLANT;  Surgeon: Deboraha Sprang, MD;  Location: [place] [facility];  Service: Cardiovascular;  Laterality: N/A;  PLACEMENT OF BREAST IMPLANTS     RIGHT/LEFT HEART CATH AND CORONARY ANGIOGRAPHY N/A 02/15/2021   Procedure: RIGHT/LEFT HEART CATH AND CORONARY ANGIOGRAPHY;  Surgeon: Jolaine Artist, MD;  Location: [place] [facility];  Service: Cardiovascular;  Laterality: N/A;    Current Meds  Medication Sig   cetirizine (ZYRTEC) 10 MG tablet Take 10 mg by mouth daily as needed for allergies.   dapagliflozin propanediol (FARXIGA) 10 MG TABS tablet Take 1 tablet (10 mg total) by mouth daily before breakfast.   folic acid (FOLVITE)  1 MG tablet Take 1 tablet (1 mg total) by mouth daily.   levETIRAcetam (KEPPRA) 500 MG tablet Take 2 tablets (1,000 mg total) by mouth 2 (two) times daily.   Multiple Vitamin (MULTIVITAMIN WITH MINERALS) TABS tablet Take 1 tablet by mouth daily.   nadolol (CORGARD) 20 MG tablet Take 1 tablet by mouth daily.   sacubitril-valsartan (ENTRESTO) 24-26 MG Take 1 tablet by mouth 2 (two) times daily.   sertraline (ZOLOFT) 50 MG tablet Take 50 mg by mouth daily.   spironolactone (ALDACTONE) 25 MG tablet Take 1 tablet (25 mg total) by mouth at bedtime.   thiamine 100 MG tablet Take 1 tablet (100 mg total) by mouth daily.   [DISCONTINUED] nadolol (CORGARD) 20 MG tablet Take 1 tablet (20 mg total) by mouth 2 (two) times daily.   [DISCONTINUED] sertraline (ZOLOFT) 25 MG tablet Patient is taking 2 tablets once a day.    Allergies  Allergen Reactions   Ibuprofen Anaphylaxis   Ondansetron Hcl Other (See Comments)    Led to QT prolongation and cardiac arrest   Chlorhexidine Other (See Comments)   Penicillins Nausea And Vomiting   Effexor [Venlafaxine] Palpitations and Other (See Comments)    Panic attacks   Latex Rash    Tape irritates the skin/ please use paper tape   Tape Rash    pls use paper tape      Review of Systems negative except from HPI and PMH  Physical Exam BP 124/86    Pulse 69    Ht 5' 3"  (1.6 m)    Wt 128 lb 3.2 oz (58.2 kg)    SpO2 99%    BMI 22.71 kg/m  Well developed and well nourished in no acute distress HENT normal E scleral and icterus clear Neck Supple JVP flat; carotids brisk and full Clear to ausculation Regular rate and rhythm, no murmurs gallops or rub Soft with active bowel sounds No clubbing cyanosis  Edema Alert and oriented, grossly normal motor and sensory function Skin Warm and Dry  ECG sinus at 69 Interval 17/09/40  CrCl cannot be calculated (Patient's most recent lab result is older than the maximum 21 days allowed.).   Assessment and  Plan OOH  Cardiac arrest  with recurrent VT-PM  with QT prolongation but no pause dependency   COVID    QT prolongation//Drug-induced--and ? predrug   Hypokalemia  intermittent    Alcoholism with recurrent sequealae pancreatitis hepatitis   Depression   Question panic attack versus tachy palpitations   Cardiomyopathy non ischemic - LGE   Rhythm is quiet.  We will continue nadolol at 20 mg which he takes at night  Episodic palpitations?  Panic attack was not identified on her 3-day monitor.  We will take a 14-day monitor.  Potassium levels are stable.  Lengthy discussion regarding episodic alcohol use and depression and anxiety.  Have encouraged her to consider restoration place, she is open to be reaching out  to psychiatry although it may be limited because of Medicaid, and have encouraged her to avoid alcohol altogether.  Discussed with her S0 the role of Al-Anon    Current medicines are reviewed at length with the patient today .  The patient does not  have concerns regarding medicines.   I,Jordan Kelly,acting as a Education administrator for Virl Axe, MD.,have documented all relevant documentation on the behalf of Virl Axe, MD,as directed by  Virl Axe, MD while in the presence of Virl Axe, MD. I, Virl Axe, MD, have reviewed all documentation for this visit. The documentation on 05/23/21 for the exam, diagnosis, procedures, and orders are all accurate and complete.

## 2021-05-23 NOTE — Progress Notes (Unsigned)
Enrolled patient for a 14 day Zio XT  monitor to be mailed to patients home  °

## 2021-05-23 NOTE — Patient Instructions (Signed)
Medication Instructions:  Your physician has recommended you make the following change in your medication:   ** Stop Entresto  ** Begin Losartan 25mg   - 1 tablet by mouth daily.  *If you need a refill on your cardiac medications before your next appointment, please call your pharmacy*   Lab Work: None ordered.  If you have labs (blood work) drawn today and your tests are completely normal, you will receive your results only by: MyChart Message (if you have MyChart) OR A paper copy in the mail If you have any lab test that is abnormal or we need to change your treatment, we will call you to review the results.   Testing/Procedures: - Long Term Monitor Instructions  Your physician has requested you wear a ZIO patch monitor for 14 days.  This is a single patch monitor. [employer] supplies one patch monitor per enrollment. Additional stickers are not available. Please do not apply patch if you will be having a Nuclear Stress Test,  Echocardiogram, Cardiac CT, MRI, or Chest Xray during the period you would be wearing the  monitor. The patch cannot be worn during these tests. You cannot remove and re-apply the  ZIO XT patch monitor.  Your ZIO patch monitor will be mailed 3 day USPS to your address on file. It may take 3-5 days  to receive your monitor after you have been enrolled.  Once you have received your monitor, please review the enclosed instructions. Your monitor  has already been registered assigning a specific monitor serial # to you.  Billing and Patient Assistance Program Information  We have supplied [employer] with any of your insurance information on file for billing purposes. [employer] offers a sliding scale Patient Assistance Program for patients that do not have  insurance, or whose insurance does not completely cover the cost of the ZIO monitor.  You must apply for the Patient Assistance Program to qualify for this discounted rate.  To apply, please call [employer] at  [phone], select option 4, select option 2, ask to apply for  Patient Assistance Program. [phone] will ask your household income, and how many people  are in your household. They will quote your out-of-pocket cost based on that information.  [employer] will also be able to set up a 71-month, interest-free payment plan if needed.  Applying the monitor   Shave hair from upper left chest.  Hold abrader disc by orange tab. Rub abrader in 40 strokes over the upper left chest as  indicated in your monitor instructions.  Clean area with 4 enclosed alcohol pads. Let dry.  Apply patch as indicated in monitor instructions. Patch will be placed under collarbone on left  side of chest with arrow pointing upward.  Rub patch adhesive wings for 2 minutes. Remove white label marked "1". Remove the white  label marked "2". Rub patch adhesive wings for 2 additional minutes.  While looking in a mirror, press and release button in center of patch. A small green light will  flash 3-4 times. This will be your only indicator that the monitor has been turned on.  Do not shower for the first 24 hours. You may shower after the first 24 hours.  Press the button if you feel a symptom. You will hear a small click. Record Date, Time and  Symptom in the Patient Logbook.  When you are ready to remove the patch, follow instructions on the last 2 pages of Patient  Logbook. Stick patch monitor onto the last page  of Patient Logbook.  Place Patient Logbook in the blue and white box. Use locking tab on box and tape box closed  securely. The blue and white box has prepaid postage on it. Please place it in the mailbox as  soon as possible. Your physician should have your test results approximately 7 days after the  monitor has been mailed back to [facility].  Call [facility] Customer Care at [phone] if you have questions regarding  your ZIO XT patch monitor. Call them immediately if you see an orange light  blinking on your  monitor.  If your monitor falls off in less than 4 days, contact our Monitor department at [phone].  If your monitor becomes loose or falls off after 4 days call [employer] at [phone] for  suggestions on securing your monitor     Follow-Up: At [facility], you and your health needs are our priority.  As part of our continuing mission to provide you with exceptional heart care, we have created designated Provider Care Teams.  These Care Teams include your primary Cardiologist (physician) and Advanced Practice Providers (APPs -  Physician Assistants and Nurse Practitioners) who all work together to provide you with the care you need, when you need it.  We recommend signing up for the patient portal called "MyChart".  Sign up information is provided on this After Visit Summary.  MyChart is used to connect with patients for Virtual Visits (Telemedicine).  Patients are able to view lab/test results, encounter notes, upcoming appointments, etc.  Non-urgent messages can be sent to your provider as well.   To learn more about what you can do with MyChart, go to [email or web address].    Your next appointment:   9 moths with Dr Graciela Husbands :1}    Other Instructions [facility] - [phone]

## 2021-05-23 NOTE — Telephone Encounter (Signed)
Pt's medication was sent to pt's pharmacy as requested. Confirmation received.  °

## 2021-05-28 ENCOUNTER — Ambulatory Visit (INDEPENDENT_AMBULATORY_CARE_PROVIDER_SITE_OTHER): Payer: Medicaid Other

## 2021-05-28 DIAGNOSIS — I469 Cardiac arrest, cause unspecified: Secondary | ICD-10-CM

## 2021-05-28 LAB — CUP PACEART REMOTE DEVICE CHECK
Battery Remaining Longevity: 156 mo
Battery Remaining Percentage: 100 %
Brady Statistic RV Percent Paced: 0 %
Date Time Interrogation Session: 20221219034500
HighPow Impedance: 62 Ohm
Implantable Lead Implant Date: 20220912
Implantable Lead Location: 753860
Implantable Lead Model: 137
Implantable Lead Serial Number: 301248
Implantable Pulse Generator Implant Date: 20220912
Lead Channel Impedance Value: 664 Ohm
Lead Channel Setting Pacing Amplitude: 2.5 V
Lead Channel Setting Pacing Pulse Width: 0.4 ms
Lead Channel Setting Sensing Sensitivity: 0.5 mV
Pulse Gen Serial Number: 208680

## 2021-05-31 DIAGNOSIS — I469 Cardiac arrest, cause unspecified: Secondary | ICD-10-CM | POA: Diagnosis not present

## 2021-05-31 DIAGNOSIS — R002 Palpitations: Secondary | ICD-10-CM

## 2021-06-05 ENCOUNTER — Encounter: Payer: Self-pay | Admitting: Internal Medicine

## 2021-06-07 NOTE — Progress Notes (Signed)
Remote ICD transmission.   

## 2021-06-22 ENCOUNTER — Other Ambulatory Visit: Payer: Self-pay

## 2021-06-22 ENCOUNTER — Encounter (HOSPITAL_COMMUNITY): Payer: Self-pay | Admitting: Internal Medicine

## 2021-06-22 ENCOUNTER — Ambulatory Visit (HOSPITAL_COMMUNITY)
Admission: RE | Admit: 2021-06-22 | Discharge: 2021-06-22 | Disposition: A | Discharge status: Home / Self Care | Payer: Medicaid Other | Source: Ambulatory Visit | Attending: Internal Medicine | Admitting: Internal Medicine

## 2021-06-22 ENCOUNTER — Ambulatory Visit (HOSPITAL_BASED_OUTPATIENT_CLINIC_OR_DEPARTMENT_OTHER)
Admission: RE | Admit: 2021-06-22 | Discharge: 2021-06-22 | Disposition: A | Discharge status: Home / Self Care | Payer: Medicaid Other | Source: Ambulatory Visit | Attending: Internal Medicine | Admitting: Internal Medicine

## 2021-06-22 VITALS — BP 118/74 | HR 80 | Ht 63.0 in | Wt 129.6 lb

## 2021-06-22 DIAGNOSIS — Z95810 Presence of automatic (implantable) cardiac defibrillator: Secondary | ICD-10-CM | POA: Insufficient documentation

## 2021-06-22 DIAGNOSIS — I5022 Chronic systolic (congestive) heart failure: Secondary | ICD-10-CM | POA: Insufficient documentation

## 2021-06-22 DIAGNOSIS — I110 Hypertensive heart disease with heart failure: Secondary | ICD-10-CM | POA: Diagnosis present

## 2021-06-22 DIAGNOSIS — Z8674 Personal history of sudden cardiac arrest: Secondary | ICD-10-CM | POA: Diagnosis not present

## 2021-06-22 DIAGNOSIS — K860 Alcohol-induced chronic pancreatitis: Secondary | ICD-10-CM | POA: Diagnosis not present

## 2021-06-22 DIAGNOSIS — R9431 Abnormal electrocardiogram [ECG] [EKG]: Secondary | ICD-10-CM | POA: Insufficient documentation

## 2021-06-22 DIAGNOSIS — Z87891 Personal history of nicotine dependence: Secondary | ICD-10-CM | POA: Insufficient documentation

## 2021-06-22 DIAGNOSIS — Z8701 Personal history of pneumonia (recurrent): Secondary | ICD-10-CM | POA: Insufficient documentation

## 2021-06-22 DIAGNOSIS — R002 Palpitations: Secondary | ICD-10-CM

## 2021-06-22 DIAGNOSIS — R7989 Other specified abnormal findings of blood chemistry: Secondary | ICD-10-CM | POA: Insufficient documentation

## 2021-06-22 DIAGNOSIS — R413 Other amnesia: Secondary | ICD-10-CM | POA: Insufficient documentation

## 2021-06-22 DIAGNOSIS — Z8616 Personal history of COVID-19: Secondary | ICD-10-CM | POA: Diagnosis not present

## 2021-06-22 DIAGNOSIS — I4901 Ventricular fibrillation: Secondary | ICD-10-CM | POA: Diagnosis not present

## 2021-06-22 DIAGNOSIS — F419 Anxiety disorder, unspecified: Secondary | ICD-10-CM | POA: Insufficient documentation

## 2021-06-22 DIAGNOSIS — E119 Type 2 diabetes mellitus without complications: Secondary | ICD-10-CM | POA: Diagnosis not present

## 2021-06-22 DIAGNOSIS — I428 Other cardiomyopathies: Secondary | ICD-10-CM | POA: Insufficient documentation

## 2021-06-22 DIAGNOSIS — Z7984 Long term (current) use of oral hypoglycemic drugs: Secondary | ICD-10-CM | POA: Diagnosis not present

## 2021-06-22 DIAGNOSIS — E876 Hypokalemia: Secondary | ICD-10-CM | POA: Diagnosis not present

## 2021-06-22 DIAGNOSIS — F32A Depression, unspecified: Secondary | ICD-10-CM | POA: Insufficient documentation

## 2021-06-22 DIAGNOSIS — R569 Unspecified convulsions: Secondary | ICD-10-CM | POA: Insufficient documentation

## 2021-06-22 DIAGNOSIS — Z79899 Other long term (current) drug therapy: Secondary | ICD-10-CM | POA: Insufficient documentation

## 2021-06-22 DIAGNOSIS — F1010 Alcohol abuse, uncomplicated: Secondary | ICD-10-CM

## 2021-06-22 LAB — ECHOCARDIOGRAM COMPLETE
AR max vel: 2.17 cm2
AV Area VTI: 2.11 cm2
AV Area mean vel: 2.01 cm2
AV Mean grad: 3 mmHg
AV Peak grad: 4.6 mmHg
Ao pk vel: 1.07 m/s
Area-P 1/2: 3.7 cm2
MV VTI: 2 cm2
S' Lateral: 3.5 cm

## 2021-06-22 NOTE — Patient Instructions (Signed)
Medication Changes:  No changes  Lab Work:  none  Testing/Procedures:  Your physician has requested that you have an echocardiogram. Echocardiography is a painless test that uses sound waves to create images of your heart. It provides your doctor with information about the size and shape of your heart and how well your hearts chambers and valves are working. This procedure takes approximately one hour. There are no restrictions for this procedure.   Referrals:  none  Special Instructions // Education:  none  Follow-Up in: 6 months (July 2023)  ** Call in the office in June for appointment**  At the [facility], you and your health needs are our priority. We have a designated team specialized in the treatment of Heart Failure. This Care Team includes your primary Heart Failure Specialized Cardiologist (physician), Advanced Practice Providers (APPs- Physician Assistants and Nurse Practitioners), and Pharmacist who all work together to provide you with the care you need, when you need it.   You may see any of the following providers on your designated Care Team at your next follow up:  Dr Arvilla Meres Dr Carron Curie, NP Robbie Lis, [place] [facility], [place] Karle Plumber, PharmD   Please be sure to bring in all your medications bottles to every appointment.   Need to Contact us:  If you have any questions or concerns before your next appointment please send [place] a message through [place] or call our office at [phone].    TO LEAVE A MESSAGE FOR THE NURSE SELECT OPTION 2, PLEASE LEAVE A MESSAGE INCLUDING: YOUR NAME DATE OF BIRTH CALL BACK NUMBER REASON FOR CALL**this is important as we prioritize the call backs  YOU WILL RECEIVE A CALL BACK THE SAME DAY AS LONG AS YOU CALL BEFORE 4:00 PM

## 2021-06-22 NOTE — Progress Notes (Signed)
°  Echocardiogram 2D Echocardiogram has been performed.  Susan [name] 06/22/2021, 2:38 PM

## 2021-06-22 NOTE — Progress Notes (Addendum)
[facility] Note    PCP: Tomasa Hose, NP PCP-Cardiologist: None  HF Cardiologist: Dr. Haroldine Laws  HPI: Susan [name] is a 41 y.o. female with PMH of ETOH use, alcoholic pancreatitis, former smoker, depression,chronic systolic heart failure/NICM, VF arrest, and [employer] ICD.    Admitted 9/6-9/13/22 after cardiac arrest w/ immediate bystander CPR. VFib on EMS arrival. ROSC achieved after ~10 min w/ ? Seizure like activity pre arrest. Ethyl level 104. Admitted by PCCM and placed on TTM. EEG suggestive of diffuse encephalopathy, nonspecific etiology. No seizures or epileptiform discharges. She was extubated and had recurrent polymorphic VT/Torsades. Received CPR + Defib w/ ROSC in ~2 min. Given K supp and placed on IV amio. Echo with severely reduced LVEF ~30% . AHF team consulted. She underwent R/LHC (on NE) showing normal cors and moderately decreased CO.  Cardiac MRI showed LV dysfunction at 37%. She subsequently underwent ICD implantation. Hospitalization c/b prolonged QT, elevated LFTs and aspiration pneumonia. Discharged on GDMT, weight 121 lbs.  - Echo (9/22): Echo EF 30-35% with regional WMAs (septum, inferolateral wall and apex)   -cMRI (9/22): no myocardial LGE, LVEF 36%, RV EF 36%  Today she returns for routine f/u. Doing well. Walking 2 miles with her 2-y/o son. Rare dyspnea. No CP. No edema, orthopnea or PND. Has been off Keppra. BP ok. Still having some problems with memory. Drinking 2 drinks per week. (Vodka and seltzer)  Echo today 06/22/20 EF 55-60% Personally reviewed Zio 11/22: Sinus. No arrhthymias    SH: Has a 40-monthold son, works as hEmergency planning/management officer Contracepting with NuvaRing.    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 Moderately depressed cardiac output on low-dose NE  Past Medical History:  Diagnosis Date   [place] ([place]) ([place] 06/15/2019   admitted with delhydration r/o arf   Alcohol abuse    Alcoholic hepatitis without ascites    Depression    Family history of adverse reaction to anesthesia    MOTHER HAD PANIC ATTACKS & NAUSEA  AFTER   Gastritis    GIB (gastrointestinal bleeding)    Hypertension    Iron deficiency anemia    Pancreatitis    PONV (postoperative nausea and vomiting)     Current Outpatient Medications  Medication Sig Dispense Refill   cetirizine (ZYRTEC) 10 MG tablet Take 10 mg by mouth daily as needed for allergies.     dapagliflozin propanediol (FARXIGA) 10 MG TABS tablet Take 1 tablet (10 mg total) by mouth daily before breakfast. 30 tablet 5   folic acid (FOLVITE) 1 MG tablet Take 1 tablet (1 mg total) by mouth daily. 30 tablet 11   levETIRAcetam (KEPPRA) 500 MG tablet Take 2 tablets (1,000 mg total) by mouth 2 (two) times daily. 120 tablet 2   losartan (COZAAR) 25 MG tablet Take 1 tablet (25 mg total) by mouth daily. 90 tablet 3   Multiple Vitamin (MULTIVITAMIN WITH MINERALS) TABS tablet Take 1 tablet by mouth daily.     nadolol (CORGARD) 20 MG tablet Take 1 tablet by mouth daily.     sertraline (ZOLOFT) 50 MG tablet Take 50 mg by mouth daily.  spironolactone (ALDACTONE) 25 MG tablet Take 1 tablet (25 mg total) by mouth at bedtime. 30 tablet 5   No current facility-administered medications for this encounter.   Allergies  Allergen Reactions   Ibuprofen Anaphylaxis   Ondansetron Hcl Other (See Comments)    Led to QT prolongation and cardiac arrest   Chlorhexidine Other (See Comments)   Penicillins Nausea And Vomiting   Effexor [Venlafaxine] Palpitations and Other (See Comments)    Panic attacks   Latex Rash    Tape irritates the skin/ please use paper tape   Tape Rash    pls use paper tape   Social History    Socioeconomic History   Marital status: Single    Spouse name: Not on file   Number of children: Not on file   Years of education: Not on file   Highest education level: Not on file  Occupational History   Occupation: Careers adviser, currently unemployed  Tobacco Use   Smoking status: Former    Packs/day: 0.50    Years: 15.00    Pack years: 7.50    Types: Cigarettes    Quit date: 2015    Years since quitting: 8.0   Smokeless tobacco: Never  Vaping Use   Vaping Use: Never used  Substance and Sexual Activity   Alcohol use: Yes   Drug use: No   Sexual activity: Yes    Birth control/protection: Injection    Comment: depo injection  Other Topics Concern   Not on file  Social History Narrative   Not on file   Social Determinants of Health   Financial Resource Strain: Medium Risk   Difficulty of Paying Living Expenses: Somewhat hard  Food Insecurity: No Food Insecurity   Worried About Charity fundraiser in the Last Year: Never true   Ran Out of Food in the Last Year: Never true  Transportation Needs: Unmet Transportation Needs   Lack of Transportation (Medical): Yes   Lack of Transportation (Non-Medical): Yes  Physical Activity: Not on file  Stress: Not on file  Social Connections: Not on file  Intimate Partner Violence: Not on file   Family History  Problem Relation Age of Onset   Stroke Mother    Diabetes Maternal Grandmother    BP 118/74    Pulse 80    Ht _0  (1.6 m)    Wt 58.8 kg (129 lb 9.6 oz)    SpO2 99%    BMI 22.96 kg/m   Wt Readings from Last 3 Encounters:  06/22/21 58.8 kg (129 lb 9.6 oz)  05/23/21 58.2 kg (128 lb 3.2 oz)  04/09/21 56.8 kg (125 lb 3.2 oz)   PHYSICAL EXAM: General:  Well appearing. No resp difficulty HEENT: normal Neck: supple. no JVD. Carotids 2+ bilat; no bruits. No lymphadenopathy or thryomegaly appreciated. Cor: PMI nondisplaced. Regular rate & rhythm. No rubs, gallops or murmurs. Lungs: clear Abdomen: soft, nontender,  nondistended. No hepatosplenomegaly. No bruits or masses. Good bowel sounds. Extremities: no cyanosis, clubbing, rash, edema Neuro: alert & orientedx3, cranial nerves grossly intact. moves all 4 extremities w/o difficulty. Affect pleasant  ASSESSMENT & PLAN:   1. Chronic Systolic Heart Failure - Echo (9/22): w/ severely reduced LVEF ~30%. No prior study for comparison  - Etiology uncertain. Troponin and ESR do not suggest viral CM. ? stress induced vs ETOH CM, or familial (Maternal Uncle s/p heart transplant in his 73s) - LHC (9/22):  with no coronary disease.   - cMRI (9/22): NICM. LVEF  37% RVEF 36% No LGE. - Echo (9/22): Echo EF 30-35% with regional WMAs (septum, inferolateral wall and apex)  - Echo today 06/22/20 EF 55-60% Personally reviewed - NYHA I. Volume status stable. She does not need diuretics.  - Continue Spironolactone 25 mg daily  - Continue losartan 25 (Failed Entresto due to low BP) - Continue Nadolol 20 mg bid (per EP) . - Continue Farxiga 10 mg daily. - Needs to abstain from ETOH completely.   2. H/O VFib Arrest x 2 - OOH VT/VF arrest 02/13/21 2/2 hypokalemia/hypomagnesemia w/ prolonged QT interval  - In-hospital PMVT arrest 9/7 w/ 2 min CPR + defib. In setting of hypokalemia (3.1).  - LHC with no CAD. - cMRI no myocardial LGE  - Etiology of VT remains unclear suspect possible underlying cardiomyopathy (?ETOH) with electrolyte abnormalities vs long QT syndrome. - S/p ICD 02/19/21. [employer] - No driving for 6 months (08/2021). - ICD interrogated personally in clinic. No VT. Parameters stable   3. Prolonged QT Interval  - Avoid QT prolonging agents (on Prozac PTA). - EP has started nadolol 20 bid to help shorten QT.   4. Seizures - w/ post arrest memory loss is slowly improving. - now off Keppra - Following with Neurology in [place]   5. ETOH Abuse  - I remain concerned over her ETOH consumption. She is still drinking (and minimizing her intake)  despite a recent life-threatening event - I encouraged her to work toward full cessation   6. Depression/Anxiety  - PTA on Prozac, discontinued for prolonged  QTc  -Now on sertraline, oked by EP - QTc ok.   7. Palpitations  - Zio 11/22 normal .   Glori Bickers, MD 06/22/21

## 2021-07-17 ENCOUNTER — Encounter: Payer: Self-pay | Admitting: Internal Medicine

## 2021-08-27 ENCOUNTER — Ambulatory Visit (INDEPENDENT_AMBULATORY_CARE_PROVIDER_SITE_OTHER): Payer: Medicaid Other

## 2021-08-27 DIAGNOSIS — I469 Cardiac arrest, cause unspecified: Secondary | ICD-10-CM | POA: Diagnosis not present

## 2021-08-28 LAB — CUP PACEART REMOTE DEVICE CHECK
Battery Remaining Longevity: 174 mo
Battery Remaining Percentage: 100 %
Brady Statistic RV Percent Paced: 0 %
Date Time Interrogation Session: 20230320080600
HighPow Impedance: 74 Ohm
Implantable Lead Implant Date: 20220912
Implantable Lead Location: 753860
Implantable Lead Model: 137
Implantable Lead Serial Number: 301248
Implantable Pulse Generator Implant Date: 20220912
Lead Channel Impedance Value: 657 Ohm
Lead Channel Setting Pacing Amplitude: 2.5 V
Lead Channel Setting Pacing Pulse Width: 0.4 ms
Lead Channel Setting Sensing Sensitivity: 0.5 mV
Pulse Gen Serial Number: 208680

## 2021-09-06 NOTE — Progress Notes (Signed)
Remote ICD transmission.   

## 2021-11-26 ENCOUNTER — Ambulatory Visit (INDEPENDENT_AMBULATORY_CARE_PROVIDER_SITE_OTHER): Payer: Medicaid Other

## 2021-11-26 DIAGNOSIS — I469 Cardiac arrest, cause unspecified: Secondary | ICD-10-CM | POA: Diagnosis not present

## 2021-11-28 LAB — CUP PACEART REMOTE DEVICE CHECK
Battery Remaining Longevity: 168 mo
Battery Remaining Percentage: 100 %
Brady Statistic RV Percent Paced: 0 %
Date Time Interrogation Session: 20230619033200
HighPow Impedance: 89 Ohm
Implantable Lead Implant Date: 20220912
Implantable Lead Location: 753860
Implantable Lead Model: 137
Implantable Lead Serial Number: 301248
Implantable Pulse Generator Implant Date: 20220912
Lead Channel Impedance Value: 665 Ohm
Lead Channel Setting Pacing Amplitude: 2.5 V
Lead Channel Setting Pacing Pulse Width: 0.4 ms
Lead Channel Setting Sensing Sensitivity: 0.5 mV
Pulse Gen Serial Number: 208680

## 2021-12-04 ENCOUNTER — Other Ambulatory Visit (HOSPITAL_COMMUNITY): Payer: Self-pay | Admitting: Internal Medicine

## 2021-12-18 NOTE — Progress Notes (Signed)
Remote ICD transmission.   

## 2022-02-25 ENCOUNTER — Ambulatory Visit (INDEPENDENT_AMBULATORY_CARE_PROVIDER_SITE_OTHER): Payer: Medicaid Other

## 2022-02-25 DIAGNOSIS — I469 Cardiac arrest, cause unspecified: Secondary | ICD-10-CM

## 2022-02-26 LAB — CUP PACEART REMOTE DEVICE CHECK
Battery Remaining Longevity: 162 mo
Battery Remaining Percentage: 100 %
Brady Statistic RV Percent Paced: 0 %
Date Time Interrogation Session: 20230918072600
HighPow Impedance: 77 Ohm
Implantable Lead Implant Date: 20220912
Implantable Lead Location: 753860
Implantable Lead Model: 137
Implantable Lead Serial Number: 301248
Implantable Pulse Generator Implant Date: 20220912
Lead Channel Impedance Value: 649 Ohm
Lead Channel Setting Pacing Amplitude: 2.5 V
Lead Channel Setting Pacing Pulse Width: 0.4 ms
Lead Channel Setting Sensing Sensitivity: 0.5 mV
Pulse Gen Serial Number: 208680

## 2022-03-07 ENCOUNTER — Encounter: Payer: Self-pay | Admitting: Internal Medicine

## 2022-03-07 ENCOUNTER — Ambulatory Visit: Payer: Medicaid Other | Attending: Internal Medicine | Admitting: Internal Medicine

## 2022-03-07 VITALS — BP 132/82 | HR 78 | Ht 63.0 in | Wt 131.0 lb

## 2022-03-07 DIAGNOSIS — I469 Cardiac arrest, cause unspecified: Secondary | ICD-10-CM

## 2022-03-07 DIAGNOSIS — F1011 Alcohol abuse, in remission: Secondary | ICD-10-CM

## 2022-03-07 DIAGNOSIS — I428 Other cardiomyopathies: Secondary | ICD-10-CM

## 2022-03-07 DIAGNOSIS — Z95810 Presence of automatic (implantable) cardiac defibrillator: Secondary | ICD-10-CM | POA: Diagnosis not present

## 2022-03-07 DIAGNOSIS — R9431 Abnormal electrocardiogram [ECG] [EKG]: Secondary | ICD-10-CM

## 2022-03-07 NOTE — Progress Notes (Signed)
Patient Care Team: Tomasa Hose, NP as PCP - General (Nurse Practitioner)   HPI  Susan [name] is a 41 y.o. female with history of alcoholism pancreatitis and depression seen in follow-up for an ICD [employer] implanted (2022)for cardiac arrest occurring in the context of nonischemic cardiomyopathy with positive LGE and QT prolongation.  She states that in regards to alcohol consumption, she drinks around twice a week. She states that it helps relax her. She denies ever having gone to a psychiatrist, but says that she had been going to a therapist.  Depression is well managed, but she admits to still feeling anxious. She is tolerating her anti-anxiety and depression medication well. She states that she currently does not have thoughts of self-harm when experiencing depressive episodes.  In regards to breathing she says it has been fine, with some difficulties. She admits to nocturnal dyspnea, and admits to having to get up during the night to have to go to the bathroom where she feels short of breath. Also, she states that she experiences something similar to an anxiety attack, where she has difficulty catching her breath. During this experience, she admits to having high heart rate.   Has been very difficult year related to custody issues and her son.  Court dates 10/23.  She is sober.!!!!  Apart from the issue above, mental health is pretty good.  Exercising.  No chest pain or shortness of breath.  Has been on SGLT2 since hospitalization.  Some lightheadedness with rapid changes in position  Date HR   QTc ([place] correction used for heart rates faster than 100) K  12/17 113 486    10/18 106 467    11/19 78 517    1/21 113 395 3.0  9/21 110 416 3.6  02/13/21 132 520 2.4  02/15/21 73 600 3.9  02/16/21 84 527 3.8    Date Cr K Hgb  10/22 0.61 4.5 12.4<<9.8             Records and Results Reviewed   Past Medical History:  Diagnosis Date   AKI (acute  kidney injury) ([place]) 06/15/2019   admitted with delhydration r/o arf   Alcohol abuse    Alcoholic hepatitis without ascites    Depression    Family history of adverse reaction to anesthesia    MOTHER HAD PANIC ATTACKS & NAUSEA  AFTER   Gastritis    GIB (gastrointestinal bleeding)    Hypertension    Iron deficiency anemia    Pancreatitis    PONV (postoperative nausea and vomiting)     Past Surgical History:  Procedure Laterality Date   CERVICAL CERCLAGE Bilateral 01/18/2019   Procedure: CERCLAGE CERVICAL;  Surgeon: Everett Graff, MD;  Location: [facility];  Service: Gynecology;  Laterality: Bilateral;   DILATATION AND CURETTAGE/HYSTEROSCOPY WITH MINERVA N/A 08/23/2019   Procedure: DILATATION AND CURETTAGE/HYSTEROSCOPY WITH MINERVA;  Surgeon: Waymon Amato, MD;  Location: [facility];  Service: Gynecology;  Laterality: N/A;  rep will be here for this case confirmed on 08/18/19.   ESOPHAGOGASTRODUODENOSCOPY (EGD) WITH PROPOFOL N/A 06/05/2016   Procedure: ESOPHAGOGASTRODUODENOSCOPY (EGD) WITH PROPOFOL;  Surgeon: Otis Brace, MD;  Location: [facility];  Service: Gastroenterology;  Laterality: N/A;   ICD IMPLANT N/A 02/19/2021   Procedure: ICD IMPLANT;  Surgeon: Deboraha Sprang, MD;  Location: [place] [facility];  Service: Cardiovascular;  Laterality: N/A;   PLACEMENT OF BREAST IMPLANTS     RIGHT/LEFT HEART CATH AND  CORONARY ANGIOGRAPHY N/A 02/15/2021   Procedure: RIGHT/LEFT HEART CATH AND CORONARY ANGIOGRAPHY;  Surgeon: Jolaine Artist, MD;  Location: [place] [facility];  Service: Cardiovascular;  Laterality: N/A;    No outpatient medications have been marked as taking for the 03/07/22 encounter (Office Visit) with Deboraha Sprang, MD.    Allergies  Allergen Reactions   Ibuprofen Anaphylaxis   Fluticasone Swelling   Ondansetron Hcl Other (See Comments)    Led to QT prolongation and cardiac arrest   Chlorhexidine Other (See Comments)   Penicillins Nausea And  Vomiting   Effexor [Venlafaxine] Palpitations and Other (See Comments)    Panic attacks   Latex Rash    Tape irritates the skin/ please use paper tape   Tape Rash    pls use paper tape      Review of Systems negative except from HPI and PMH  Physical Exam BP 132/82   Pulse 78   Ht 5' 3"  (1.6 m)   Wt 131 lb (59.4 kg)   SpO2 98%   BMI 23.21 kg/m  Well developed and well nourished in no acute distress HENT normal Neck supple with JVP-flat Clear Device pocket well healed; without hematoma or erythema.  There is no tethering  Regular rate and rhythm, no  gallop No  murmur Abd-soft with active BS No Clubbing cyanosis  edema Skin-warm and dry A & Oriented  Grossly normal sensory and motor function  ECG sinus @ 81 18/09/40       CrCl cannot be calculated (Patient's most recent lab result is older than the maximum 21 days allowed.).   Assessment and  Plan OOH Cardiac arrest  with recurrent VT-PM  with QT prolongation but no pause dependency   COVID    QT prolongation//Drug-induced--and ? predrug   Hypokalemia  intermittent    Alcoholism with recurrent sequealae pancreatitis hepatitis>> sober   Depression >> improved  Question panic attack versus tachy palpitations   Cardiomyopathy non ischemic - LGE   Rhythm is quiet  we will continue her on nadolol.  With normalized LV function, we will stop her SGLT2.  Was previously on losartan not quite sure why it got stopped.  We will have to review.  Congratulations on stopping drinking

## 2022-03-07 NOTE — Patient Instructions (Addendum)
Medication Instructions:  Your physician has recommended you make the following change in your medication:   ** Stop Wilder Glade  *If you need a refill on your cardiac medications before your next appointment, please call your pharmacy*   Lab Work: BMET and Mg today  If you have labs (blood work) drawn today and your tests are completely normal, you will receive your results only by: [place] (if you have MyChart) OR A paper copy in the mail If you have any lab test that is abnormal or we need to change your treatment, we will call you to review the results.   Testing/Procedures: None ordered.    Follow-Up: At [facility], you and your health needs are our priority.  As part of our continuing mission to provide you with exceptional heart care, we have created designated Provider Care Teams.  These Care Teams include your primary Cardiologist (physician) and Advanced Practice Providers (APPs -  Physician Assistants and Nurse Practitioners) who all work together to provide you with the care you need, when you need it.  We recommend signing up for the patient portal called "MyChart".  Sign up information is provided on this After Visit Summary.  MyChart is used to connect with patients for Virtual Visits (Telemedicine).  Patients are able to view lab/test results, encounter notes, upcoming appointments, etc.  Non-urgent messages can be sent to your provider as well.   To learn more about what you can do with MyChart, go to [email or web address].    Your next appointment:   12 months with Dr Caryl Comes  Important Information About Sugar

## 2022-03-08 LAB — BASIC METABOLIC PANEL
BUN/Creatinine Ratio: 11 (ref 9–23)
BUN: 8 mg/dL (ref 6–24)
CO2: 16 mmol/L — ABNORMAL LOW (ref 20–29)
Calcium: 9.5 mg/dL (ref 8.7–10.2)
Chloride: 105 mmol/L (ref 96–106)
Creatinine, Ser: 0.71 mg/dL (ref 0.57–1.00)
Glucose: 95 mg/dL (ref 70–99)
Potassium: 4.3 mmol/L (ref 3.5–5.2)
Sodium: 138 mmol/L (ref 134–144)
eGFR: 110 mL/min/{1.73_m2} (ref >59)

## 2022-03-08 LAB — MAGNESIUM: Magnesium: 2.3 mg/dL (ref 1.6–2.3)

## 2022-03-12 NOTE — Progress Notes (Signed)
Remote ICD transmission.   

## 2022-05-27 ENCOUNTER — Ambulatory Visit (INDEPENDENT_AMBULATORY_CARE_PROVIDER_SITE_OTHER): Payer: Medicaid Other

## 2022-05-27 DIAGNOSIS — I428 Other cardiomyopathies: Secondary | ICD-10-CM

## 2022-05-28 LAB — CUP PACEART REMOTE DEVICE CHECK
Battery Remaining Longevity: 156 mo
Battery Remaining Percentage: 100 %
Brady Statistic RV Percent Paced: 0 %
Date Time Interrogation Session: 20231218080400
HighPow Impedance: 79 Ohm
Implantable Lead Connection Status: 753985
Implantable Lead Implant Date: 20220912
Implantable Lead Location: 753860
Implantable Lead Model: 137
Implantable Lead Serial Number: 301248
Implantable Pulse Generator Implant Date: 20220912
Lead Channel Impedance Value: 682 Ohm
Lead Channel Setting Pacing Amplitude: 2.5 V
Lead Channel Setting Pacing Pulse Width: 0.4 ms
Lead Channel Setting Sensing Sensitivity: 0.5 mV
Pulse Gen Serial Number: 208680
Zone Setting Status: 755011

## 2022-07-03 NOTE — Progress Notes (Signed)
Remote ICD transmission.   

## 2022-08-26 ENCOUNTER — Ambulatory Visit (INDEPENDENT_AMBULATORY_CARE_PROVIDER_SITE_OTHER): Payer: Medicaid Other

## 2022-08-26 DIAGNOSIS — I428 Other cardiomyopathies: Secondary | ICD-10-CM | POA: Diagnosis not present

## 2022-08-28 LAB — CUP PACEART REMOTE DEVICE CHECK
Battery Remaining Longevity: 150 mo
Battery Remaining Percentage: 100 %
Brady Statistic RV Percent Paced: 0 %
Date Time Interrogation Session: 20240318033100
HighPow Impedance: 68 Ohm
Implantable Lead Connection Status: 753985
Implantable Lead Implant Date: 20220912
Implantable Lead Location: 753860
Implantable Lead Model: 137
Implantable Lead Serial Number: 301248
Implantable Pulse Generator Implant Date: 20220912
Lead Channel Impedance Value: 630 Ohm
Lead Channel Setting Pacing Amplitude: 2.5 V
Lead Channel Setting Pacing Pulse Width: 0.4 ms
Lead Channel Setting Sensing Sensitivity: 0.5 mV
Pulse Gen Serial Number: 208680
Zone Setting Status: 755011

## 2022-10-07 NOTE — Progress Notes (Signed)
Remote ICD transmission.   

## 2022-11-25 ENCOUNTER — Ambulatory Visit (INDEPENDENT_AMBULATORY_CARE_PROVIDER_SITE_OTHER): Payer: Medicaid Other

## 2022-11-25 DIAGNOSIS — I428 Other cardiomyopathies: Secondary | ICD-10-CM | POA: Diagnosis not present

## 2022-11-26 LAB — CUP PACEART REMOTE DEVICE CHECK
Battery Remaining Longevity: 156 mo
Battery Remaining Percentage: 100 %
Brady Statistic RV Percent Paced: 0 %
Date Time Interrogation Session: 20240617033100
HighPow Impedance: 69 Ohm
Implantable Lead Connection Status: 753985
Implantable Lead Implant Date: 20220912
Implantable Lead Location: 753860
Implantable Lead Model: 137
Implantable Lead Serial Number: 301248
Implantable Pulse Generator Implant Date: 20220912
Lead Channel Impedance Value: 624 Ohm
Lead Channel Setting Pacing Amplitude: 2.5 V
Lead Channel Setting Pacing Pulse Width: 0.4 ms
Lead Channel Setting Sensing Sensitivity: 0.5 mV
Pulse Gen Serial Number: 208680
Zone Setting Status: 755011

## 2022-12-16 NOTE — Progress Notes (Signed)
Remote ICD transmission.   

## 2022-12-21 ENCOUNTER — Other Ambulatory Visit (HOSPITAL_COMMUNITY): Payer: Self-pay

## 2023-05-26 ENCOUNTER — Ambulatory Visit (INDEPENDENT_AMBULATORY_CARE_PROVIDER_SITE_OTHER): Payer: Medicaid Other

## 2023-05-26 DIAGNOSIS — I428 Other cardiomyopathies: Secondary | ICD-10-CM | POA: Diagnosis not present

## 2023-05-28 LAB — CUP PACEART REMOTE DEVICE CHECK
Battery Remaining Longevity: 144 mo
Battery Remaining Percentage: 100 %
Brady Statistic RV Percent Paced: 0 %
Date Time Interrogation Session: 20241217102400
HighPow Impedance: 73 Ohm
Implantable Lead Connection Status: 753985
Implantable Lead Implant Date: 20220912
Implantable Lead Location: 753860
Implantable Lead Model: 137
Implantable Lead Serial Number: 301248
Implantable Pulse Generator Implant Date: 20220912
Lead Channel Impedance Value: 529 Ohm
Lead Channel Setting Pacing Amplitude: 2.5 V
Lead Channel Setting Pacing Pulse Width: 0.4 ms
Lead Channel Setting Sensing Sensitivity: 0.5 mV
Pulse Gen Serial Number: 208680
Zone Setting Status: 755011

## 2023-07-01 NOTE — Progress Notes (Signed)
Remote ICD transmission.   

## 2023-07-01 NOTE — Addendum Note (Signed)
Addended by: Geralyn Flash D on: 07/01/2023 11:20 AM   Modules accepted: Orders

## 2023-08-25 ENCOUNTER — Ambulatory Visit (INDEPENDENT_AMBULATORY_CARE_PROVIDER_SITE_OTHER): Payer: Medicaid Other

## 2023-08-25 DIAGNOSIS — I469 Cardiac arrest, cause unspecified: Secondary | ICD-10-CM | POA: Diagnosis not present

## 2023-08-27 LAB — CUP PACEART REMOTE DEVICE CHECK
Battery Remaining Longevity: 150 mo
Battery Remaining Percentage: 100 %
Brady Statistic RV Percent Paced: 0 %
Date Time Interrogation Session: 20250317033100
HighPow Impedance: 63 Ohm
Implantable Lead Connection Status: 753985
Implantable Lead Implant Date: 20220912
Implantable Lead Location: 753860
Implantable Lead Model: 137
Implantable Lead Serial Number: 301248
Implantable Pulse Generator Implant Date: 20220912
Lead Channel Impedance Value: 507 Ohm
Lead Channel Setting Pacing Amplitude: 2.5 V
Lead Channel Setting Pacing Pulse Width: 0.4 ms
Lead Channel Setting Sensing Sensitivity: 0.5 mV
Pulse Gen Serial Number: 208680
Zone Setting Status: 755011

## 2023-09-22 ENCOUNTER — Encounter: Payer: Self-pay | Admitting: Internal Medicine

## 2023-10-01 ENCOUNTER — Telehealth: Payer: Self-pay | Admitting: Internal Medicine

## 2023-10-01 NOTE — Telephone Encounter (Signed)
 Pt would like to have all her records sent over to [facility] health Cardiology for Dr. Thornell Flirt

## 2023-10-01 NOTE — Telephone Encounter (Signed)
 Will send this message to our [facility], for further assistance and management of releasing requested records.

## 2023-10-01 NOTE — Telephone Encounter (Signed)
 Roddie Cisco, LPN Caller: Unspecified (Today,  9:22 AM) I left a message for the patient to call  [facility] HIM depart .

## 2023-10-05 ENCOUNTER — Ambulatory Visit (HOSPITAL_COMMUNITY): Admission: EM | Admit: 2023-10-05 | Discharge: 2023-10-05 | Disposition: A | Discharge status: Home / Self Care

## 2023-10-05 ENCOUNTER — Encounter (HOSPITAL_COMMUNITY): Payer: Self-pay | Admitting: *Deleted

## 2023-10-05 ENCOUNTER — Ambulatory Visit (INDEPENDENT_AMBULATORY_CARE_PROVIDER_SITE_OTHER)

## 2023-10-05 DIAGNOSIS — M25562 Pain in left knee: Secondary | ICD-10-CM

## 2023-10-05 DIAGNOSIS — W19XXXA Unspecified fall, initial encounter: Secondary | ICD-10-CM | POA: Diagnosis not present

## 2023-10-05 DIAGNOSIS — S82142A Displaced bicondylar fracture of left tibia, initial encounter for closed fracture: Secondary | ICD-10-CM

## 2023-10-05 NOTE — ED Triage Notes (Addendum)
 Pt reports that she had a client behind her as they were coming down a set of stairs; the client fell, causing both her and pt to fall down approx 1 flight of stairs. Pt denies any head injury, but states her left knee was immediately painful. Denies any left patellar pain, even with palpation. Pt reports minor abrasion to left hand. States unable to bear any weight on left knee. Tiny abrasion noted to left knee. LLE CMS intact. Initially was having some parasthesias in left toes, but not at present. Has been applying ice.

## 2023-10-05 NOTE — ED Provider Notes (Signed)
 [facility]    CSN: [id number] Arrival date & time: 10/05/23  1521      History   Chief Complaint Chief Complaint  Patient presents with   Fall   Knee Pain    HPI Susan [name] is a 43 y.o. female.   43 year old female who presents urgent care with complaints of left knee pain, swelling and inability to bear any weight.  She reports that earlier today she was assisting a client down the stairs when the client fell causing her to fall as well down a flight of stairs.  She immediately had severe pain in the left knee and thought she was going to have bones coming out due to the pain.  She has been icing the area but has not been able to bear any weight without severe pain.  She denies any other injury.  She has some pre-existing arthritis in the knees but no previous knee injuries.  The pain is especially significant along the posterior aspect of the knee but is tender to palpitation along the medial and lateral joint spaces as well.  She also has a very superficial abrasion on the left hand and anterior knee on the left   Fall Pertinent negatives include no chest pain, no abdominal pain and no shortness of breath.  Knee Pain Associated symptoms: no back pain and no fever     Past Medical History:  Diagnosis Date   AKI (acute kidney injury) (HCC) 06/15/2019   admitted with delhydration r/o arf   Alcohol abuse    Alcoholic hepatitis without ascites    Depression    Family history of adverse reaction to anesthesia    MOTHER HAD PANIC ATTACKS & NAUSEA  AFTER   Gastritis    GIB (gastrointestinal bleeding)    Hypertension    Iron  deficiency anemia    Pancreatitis    PONV (postoperative nausea and vomiting)     Patient Active Problem List   Diagnosis Date Noted   NICM (nonischemic cardiomyopathy) (HCC) 03/07/2022   History of alcohol abuse 03/07/2022   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/2021   Generalized anxiety disorder  03/29/2021   Acute systolic congestive heart failure (HCC) 02/23/2021   Myocardial infarction (HCC) 02/23/2021   Seizure (HCC) 02/23/2021   Cardiac arrest (HCC) 02/13/2021   MDD (major depressive disorder) 11/13/2020   ARF (acute renal failure) (HCC) 02/18/2020   Ketoacidosis 02/18/2020   SVD (11/3) 04/13/2019   AMA (advanced maternal age) multigravida 35+ 04/02/2019   Positive GBS  04/02/2019   Chronic hypertension 04/02/2019   PPROM 04/02/2019   Premature cervical dilation in second trimester 01/16/2019   Anemia 05/20/2018   Bell's palsy 05/20/2018   Superficial phlebitis of arm 05/20/2018   Alcohol-induced pancreatitis 03/25/2018   Foreign body alimentary tract, subsequent encounter 03/25/2018   AKI (acute kidney injury) (HCC) 03/25/2018   Alcoholic hepatitis without ascites 03/25/2018   Alcoholic hepatitis 03/25/2018   History of acute renal failure 03/25/2018   History of pancreatitis 03/25/2018   Acute pancreatitis 03/22/2018   Gastritis 03/03/2018   Depression 03/03/2018   Iron  deficiency anemia 03/03/2018   Metabolic acidosis 03/31/2017   GI bleed 09/05/2016    Past Surgical History:  Procedure Laterality Date   CERVICAL CERCLAGE Bilateral 01/18/2019   Procedure: CERCLAGE CERVICAL;  Surgeon: Renea Carrion, MD;  Location: [facility];  Service: Gynecology;  Laterality: Bilateral;   DILATATION AND CURETTAGE/HYSTEROSCOPY WITH MINERVA N/A 08/23/2019   Procedure: DILATATION AND CURETTAGE/HYSTEROSCOPY  WITH MINERVA;  Surgeon: Vernal Gold, MD;  Location: [facility];  Service: Gynecology;  Laterality: N/A;  rep will be here for this case confirmed on 08/18/19.   ESOPHAGOGASTRODUODENOSCOPY (EGD) WITH PROPOFOL  N/A 06/05/2016   Procedure: ESOPHAGOGASTRODUODENOSCOPY (EGD) WITH PROPOFOL ;  Surgeon: Felecia Hopper, MD;  Location: [facility];  Service: Gastroenterology;  Laterality: N/A;   ICD IMPLANT N/A 02/19/2021   Procedure: ICD IMPLANT;  Surgeon: Verona Goodwill, MD;  Location: [facility] INVAS[facility] CV LAB;  Service: Cardiovascular;  Laterality: N/A;   MULTIPLE TOOTH EXTRACTIONS     10/02/23   PLACEMENT OF BREAST IMPLANTS     RIGHT/LEFT HEART CATH AND CORONARY ANGIOGRAPHY N/A 02/15/2021   Procedure: RIGHT/LEFT HEART CATH AND CORONARY ANGIOGRAPHY;  Surgeon: Mardell Shade, MD;  Location: [facility];  Service: Cardiovascular;  Laterality: N/A;    OB History     Gravida  4   Para  1   Term  0   Preterm  1   AB  3   Living  1      SAB  3   IAB  0   Ectopic  0   Multiple  0   Live Births  1            Home Medications    Prior to Admission medications   Medication Sig Start Date End Date Taking? Authorizing Provider  Acetaminophen  (TYLENOL  PO) Take by mouth.   Yes [provider]  cetirizine (ZYRTEC) 10 MG tablet Take 10 mg by mouth daily as needed for allergies.   Yes [provider]  etonogestrel (NEXPLANON) 68 MG IMPL implant Inject into the skin.   Yes [provider]  folic acid  (FOLVITE ) 1 MG tablet Take 1 tablet (1 mg total) by mouth daily. 03/19/21  Yes Bensimhon, Rheta Celestine, MD  IBUPROFEN  PO Take by mouth.   Yes [provider]  Multiple Vitamin (MULTIVITAMIN WITH MINERALS) TABS tablet Take 1 tablet by mouth daily. 02/19/20  Yes Audria Leather, MD  nadolol  (CORGARD ) 20 MG tablet Take 1 tablet by mouth daily. 05/14/21  Yes [provider]  rosuvastatin (CRESTOR) 10 MG tablet Take 10 mg by mouth at bedtime. 11/30/21  Yes [provider]    Family History Family History  Problem Relation Age of Onset   Stroke Mother    Diabetes Maternal Grandmother     Social History Social History   Tobacco Use   Smoking status: Former    Current packs/day: 0.00    Average packs/day: 0.5 packs/day for 15.0 years (7.5 ttl pk-yrs)    Types: Cigarettes    Start date: 2000    Quit date: 2015    Years since quitting: 10.3   Smokeless tobacco: Never  Vaping Use   Vaping  status: Every Day  Substance Use Topics   Alcohol use: Yes    Comment: occasionally   Drug use: No     Allergies   Fluticasone, Ondansetron  hcl, Chlorhexidine , Penicillins, Effexor  [venlafaxine ], Latex, and Tape   Review of Systems Review of Systems  Constitutional:  Negative for chills and fever.  HENT:  Negative for ear pain and sore throat.   Eyes:  Negative for pain and visual disturbance.  Respiratory:  Negative for cough and shortness of breath.   Cardiovascular:  Negative for chest pain and palpitations.  Gastrointestinal:  Negative for abdominal pain and vomiting.  Genitourinary:  Negative for dysuria and hematuria.  Musculoskeletal:  Negative for arthralgias and  back pain.       Left knee pain and swelling  Skin:  Negative for color change and rash.  Neurological:  Negative for seizures and syncope.  All other systems reviewed and are negative.    Physical Exam Triage Vital Signs ED Triage Vitals  Encounter Vitals Group     BP 10/05/23 1552 (!) 124/90     Systolic BP Percentile --      Diastolic BP Percentile --      Pulse Rate 10/05/23 1552 82     Resp 10/05/23 1552 16     Temp 10/05/23 1552 98.1 F (36.7 C)     Temp Source 10/05/23 1552 Oral     SpO2 10/05/23 1552 97 %     Weight --      Height --      Head Circumference --      Peak Flow --      Pain Score 10/05/23 1553 0     Pain Loc --      Pain Education --      Exclude from Growth Chart --    No data found.  Updated Vital Signs BP (!) 124/90   Pulse 82   Temp 98.1 F (36.7 C) (Oral)   Resp 16   SpO2 97%   Visual Acuity Right Eye Distance:   Left Eye Distance:   Bilateral Distance:    Right Eye Near:   Left Eye Near:    Bilateral Near:     Physical Exam Vitals and nursing note reviewed.  Constitutional:      General: She is not in acute distress.    Appearance: She is well-developed.  HENT:     Head: Normocephalic and atraumatic.  Eyes:     Conjunctiva/sclera: Conjunctivae  normal.  Cardiovascular:     Rate and Rhythm: Normal rate.     Heart sounds: No murmur heard. Pulmonary:     Effort: Pulmonary effort is normal. No respiratory distress.  Abdominal:     Palpations: Abdomen is soft.     Tenderness: There is no abdominal tenderness.  Musculoskeletal:        General: No swelling.     Cervical back: Neck supple.     Left knee: Swelling and effusion present. No deformity or erythema. Decreased range of motion. Tenderness present over the medial joint line and lateral joint line. Normal pulse.     Instability Tests: Anterior drawer test negative. Posterior drawer test negative. Medial McMurray test negative and lateral McMurray test negative.     Comments: Swelling and pain present making it somewhat difficult to completely evaluate for laxity but on the evaluation we are able to perform there does not appear to be any laxity.  Skin:    General: Skin is warm and dry.     Capillary Refill: Capillary refill takes less than 2 seconds.  Neurological:     Mental Status: She is alert.  Psychiatric:        Mood and Affect: Mood normal.     UC Treatments / Results  Labs (all labs ordered are listed, but only abnormal results are displayed) Labs Reviewed - No data to display  EKG   Radiology No results found.  Procedures Procedures (including critical care time)  Medications Ordered in UC Medications - No data to display  Initial Impression / Assessment and Plan / UC Course  I have reviewed the triage vital signs and the nursing notes.  Pertinent labs & imaging results  that were available during my care of the patient were reviewed by me and considered in my medical decision making (see chart for details).  Clinical Course as of 10/05/23 1641  Sun Oct 05, 2023  1640 DG Knee Complete 4 Views Left [EW]    Clinical Course User Index [EW] Kreg Pesa, PA-C    Acute pain of left knee  Fall, initial encounter  Closed fracture of left  tibial plateau, initial encounter   X-ray done today.  Final evaluation by the radiologist is still pending but on brief evaluation there does appear to be a left lateral tibial plateau fracture.  This will require orthopedic intervention. We recommend using a knee immobilizer at all times day/night and nonweightbearing using a walker until you follow-up with orthopedics to get further advice.  Contact orthopedics tomorrow morning or go to the orthopedic urgent care clinic first thing tomorrow. Ice the area 2-3 times daily for 10-15 minutes to help with pain and swelling. Do not apply ice directly to the skin.  May use ibuprofen  for pain or severe pain May use the Norco that she has from her dental procedure last week that she has not used.   Final Clinical Impressions(s) / UC Diagnoses   Final diagnoses:  None   Discharge Instructions   None    ED Prescriptions   None    PDMP not reviewed this encounter.   Kreg Pesa, [place] 10/05/23 1720

## 2023-10-05 NOTE — Discharge Instructions (Addendum)
 X-ray done today.  Final evaluation by the radiologist is still pending but on brief evaluation there does appear to be a left lateral tibial plateau fracture.  This will require orthopedic intervention. We recommend using an immobilizer and nonweightbearing using a walker until you follow-up with orthopedics.  Contact orthopedics tomorrow morning or go to the orthopedic urgent care clinic first thing tomorrow. Ice the area 2-3 times daily for 10-15 minutes to help with pain and swelling. Do not apply ice directly to the skin.  May use ibuprofen  for pain or if you have severe pain May use the Norco that you have at home.

## 2023-10-13 NOTE — Progress Notes (Signed)
 Remote ICD transmission.

## 2023-10-13 NOTE — Addendum Note (Signed)
 Addended by: Edra Govern D on: 10/13/2023 12:56 PM   Modules accepted: Orders

## 2023-11-13 ENCOUNTER — Ambulatory Visit: Attending: Physician Assistant

## 2023-11-13 ENCOUNTER — Other Ambulatory Visit: Payer: Self-pay

## 2023-11-13 DIAGNOSIS — M25562 Pain in left knee: Secondary | ICD-10-CM | POA: Insufficient documentation

## 2023-11-13 DIAGNOSIS — R262 Difficulty in walking, not elsewhere classified: Secondary | ICD-10-CM | POA: Diagnosis present

## 2023-11-13 DIAGNOSIS — G8929 Other chronic pain: Secondary | ICD-10-CM | POA: Insufficient documentation

## 2023-11-13 DIAGNOSIS — M6281 Muscle weakness (generalized): Secondary | ICD-10-CM | POA: Insufficient documentation

## 2023-11-13 NOTE — Therapy (Signed)
 OUTPATIENT PHYSICAL THERAPY LOWER EXTREMITY EVALUATION   Patient Name: Susan [name] MRN: [id number] DOB:[birth year], 43 y.o., female Today's Date: 11/14/2023  END OF SESSION:  PT End of Session - 11/14/23 0927     Visit Number 1    Number of Visits 13    Date for PT Re-Evaluation 01/02/24    Authorization Type [place] MEDICAID [employer]    Authorization - Visit Number 1    PT Start Time 1415    PT Stop Time 1530    PT Time Calculation (min) 75 min    Activity Tolerance Patient tolerated treatment well    Behavior During Therapy WFL for tasks assessed/performed             Past Medical History:  Diagnosis Date   AKI (acute kidney injury) (HCC) 06/15/2019   admitted with delhydration r/o arf   Alcohol abuse    Alcoholic hepatitis without ascites    Depression    Family history of adverse reaction to anesthesia    MOTHER HAD PANIC ATTACKS & NAUSEA  AFTER   Gastritis    GIB (gastrointestinal bleeding)    Hypertension    Iron  deficiency anemia    Pancreatitis    PONV (postoperative nausea and vomiting)    Past Surgical History:  Procedure Laterality Date   CERVICAL CERCLAGE Bilateral 01/18/2019   Procedure: CERCLAGE CERVICAL;  Surgeon: Renea Carrion, MD;  Location: [facility];  Service: Gynecology;  Laterality: Bilateral;   DILATATION AND CURETTAGE/HYSTEROSCOPY WITH MINERVA N/A 08/23/2019   Procedure: DILATATION AND CURETTAGE/HYSTEROSCOPY WITH MINERVA;  Surgeon: Vernal Gold, MD;  Location: [facility];  Service: Gynecology;  Laterality: N/A;  rep will be here for this case confirmed on 08/18/19.   ESOPHAGOGASTRODUODENOSCOPY (EGD) WITH PROPOFOL  N/A 06/05/2016   Procedure: ESOPHAGOGASTRODUODENOSCOPY (EGD) WITH PROPOFOL ;  Surgeon: Felecia Hopper, MD;  Location: [facility];  Service: Gastroenterology;  Laterality: N/A;   ICD IMPLANT N/A 02/19/2021   Procedure: ICD IMPLANT;  Surgeon: Verona Goodwill, MD;  Location: [facility] INVAS[facility] CV LAB;   Service: Cardiovascular;  Laterality: N/A;   MULTIPLE TOOTH EXTRACTIONS     10/02/23   PLACEMENT OF BREAST IMPLANTS     RIGHT/LEFT HEART CATH AND CORONARY ANGIOGRAPHY N/A 02/15/2021   Procedure: RIGHT/LEFT HEART CATH AND CORONARY ANGIOGRAPHY;  Surgeon: Mardell Shade, MD;  [facility];  Service: Cardiovascular;  Laterality: N/A;   Patient Active Problem List   Diagnosis Date Noted   NICM (nonischemic cardiomyopathy) (HCC) 03/07/2022   History of alcohol abuse 03/07/2022   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/2021   Generalized anxiety disorder 03/29/2021   Acute systolic congestive heart failure (HCC) 02/23/2021   Myocardial infarction (HCC) 02/23/2021   Seizure (HCC) 02/23/2021   Cardiac arrest (HCC) 02/13/2021   MDD (major depressive disorder) 11/13/2020   ARF (acute renal failure) (HCC) 02/18/2020   Ketoacidosis 02/18/2020   SVD (11/3) 04/13/2019   AMA (advanced maternal age) multigravida 35+ 04/02/2019   Positive GBS  04/02/2019   Chronic hypertension 04/02/2019   PPROM 04/02/2019   Premature cervical dilation in second trimester 01/16/2019   Anemia 05/20/2018   Bell's palsy 05/20/2018   Superficial phlebitis of arm 05/20/2018   Alcohol-induced pancreatitis 03/25/2018   Foreign body alimentary tract, subsequent encounter 03/25/2018   AKI (acute kidney injury) (HCC) 03/25/2018   Alcoholic hepatitis without ascites 03/25/2018   Alcoholic hepatitis 03/25/2018   History of acute renal failure 03/25/2018   History of pancreatitis 03/25/2018  Acute pancreatitis 03/22/2018   Gastritis 03/03/2018   Depression 03/03/2018   Iron  deficiency anemia 03/03/2018   Metabolic acidosis 03/31/2017   GI bleed 09/05/2016    PCP: Tristan Furlough, NP   REFERRING PROVIDER: Izetta Marshall, PA   REFERRING DIAG: displaced bicondylar fracture of left tibia ,sequela   THERAPY DIAG:  Chronic pain of left knee  Muscle weakness  (generalized)  Difficulty in walking, not elsewhere classified  Rationale for Evaluation and Treatment: Rehabilitation  ONSET DATE: 10/05/23  SUBJECTIVE:   SUBJECTIVE STATEMENT: Pt reports she was injured when descending steps, a client who was behind her, fell forward on to her, causing a fall down at least 7 steps. Pt reports currently experiencing low to no pain. 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 Pt will elevate and use a a cold as needed to manage. Pt notes she progresses to full WBnext week.  PERTINENT HISTORY: See   PAIN:  Are you having pain? Yes: NPRS scale: 2/10; pain range 0-2/10 Pain location: Medial L knee Pain description: ache Aggravating factors: Prolonged standing Relieving factors: Rest, elevation  PRECAUTIONS: Knee: 10/16/23-OK for all ROM, isometric quad strengthening, 2 TDWB, then 2 weeks 50% wb, then full  RED FLAGS: None   WEIGHT BEARING RESTRICTIONS: No  FALLS:  Has patient fallen in last 6 months? Yes. Number of falls 1 The 1 causing her injury  LIVING ENVIRONMENT: Lives with: lives with their family Lives in: House/apartment Stairs: Yes: External: 4 steps; can reach both Has following equipment at home: Otho Blitz - 4 wheeled  OCCUPATION: Hair stylist, photographer  PLOF: Independent  PATIENT GOALS: Return to function for work, play with son, yoga  NEXT MD VISIT: 11/18/23  OBJECTIVE:  Note: Objective measures were completed at Evaluation unless otherwise noted.  DIAGNOSTIC FINDINGS:  10/05/23 IMPRESSION: 1. Minimally displaced intra-articular lateral tibial plateau fracture, with associated lipohemarthrosis.  PATIENT SURVEYS:  LEFS: 26/80=33%  COGNITION: Overall cognitive status: Within functional limits for tasks assessed     SENSATION: WFL  EDEMA:   Minimal noted per palpation  MUSCLE LENGTH: Hamstrings: Right WNLs deg; Left WNLs deg Andy Bannister test: Right WNLs deg;  Left WNLs deg  POSTURE: No Significant postural limitations  PALPATION: TTP medial tibial plateau  LOWER EXTREMITY ROM:  Active ROM Right eval Left eval  Hip flexion    Hip extension    Hip abduction    Hip adduction    Hip internal rotation    Hip external rotation    Knee flexion  135  Knee extension  0  Ankle dorsiflexion    Ankle plantarflexion    Ankle inversion    Ankle eversion     (Blank rows = not tested)  LOWER EXTREMITY MMT:  MMT Right eval Left eval  Hip flexion 4+   Hip extension 4   Hip abduction 4   Hip adduction    Hip internal rotation    Hip external rotation 4+   Knee flexion NT   Knee extension NT   Ankle dorsiflexion    Ankle plantarflexion    Ankle inversion    Ankle eversion     (Blank rows = not tested)  LOWER EXTREMITY SPECIAL TESTS:  NT  FUNCTIONAL TESTS:  5 times sit to stand: TBA 2 minute walk test: TBA  GAIT: Distance walked: 200' Assistive device utilized: Environmental consultant - 4 wheeled; hinged knee brace with flexion to 50d Level of  assistance: Modified independence Comments: WB through forefoot and then progressed a heel to toe pattern maintaining 50% WB                                                                                                              TREATMENT DATE:   [facility] Adult PT Treatment:                                                DATE: 11/13/23 Therapeutic Exercise: Developed, instructed in, and pt completed therex as noted in HEP  Therapeutic Activity: Gait training for heel to toe gait pattern c RW, 50% WB  PATIENT EDUCATION:  Education details: Eval findings, POC, HEP, self care- RICE for pain and swelling Person educated: Patient Education method: Explanation, Demonstration, Tactile cues, Verbal cues, and Handouts Education comprehension: verbalized understanding, returned demonstration, verbal cues required, and tactile cues required  HOME EXERCISE PROGRAM: Access Code: [facility] URL:  [email or web address] Date: 11/13/2023 Prepared by: Liborio Reeds  Exercises - Supine Quad Set  - 2 x daily - 7 x weekly - 1-2 sets - 10 reps - 5 hold - Active Straight Leg Raise with Quad Set (Mirrored)  - 2 x daily - 7 x weekly - 1-2 sets - 10 reps - 3 hold - Sidelying Hip Abduction (Mirrored)  - 2 x daily - 7 x weekly - 1-2 sets - 10 reps - 3 hold - Prone Hip Extension (Mirrored)  - 2 x daily - 7 x weekly - 1-2 sets - 10 reps - 3 hold  ASSESSMENT:  CLINICAL IMPRESSION: Patient is a 43 y.o. female who was seen today for physical therapy evaluation and treatment for displaced bicondylar fracture of left tibia ,sequela..Pt presents with normal AROM of the L knee, hip weakness, and walking with RW and hinged knee brace. Pt is to progress to full WB next weak. A HEP for LE strengthening was started. Pt will benefit from skilled PT 2w6 to address impairments to optimize L knee function with less pain.     OBJECTIVE IMPAIRMENTS: decreased activity tolerance, decreased balance, difficulty walking, decreased strength, and pain.   ACTIVITY LIMITATIONS: carrying, lifting, standing, squatting, stairs, transfers, and locomotion level  PARTICIPATION LIMITATIONS: meal prep, cleaning, laundry, shopping, community activity, and occupation  PERSONAL FACTORS: Time since onset of injury/illness/exacerbation are also affecting patient's functional outcome.   REHAB POTENTIAL: Good  CLINICAL DECISION MAKING: Evolving/moderate complexity  EVALUATION COMPLEXITY: Moderate   GOALS:  SHORT TERM GOALS=LTGs  LONG TERM GOALS: Target date: 01/02/24  Pt will be Ind in a final HEP to maintain achieved LOF  Baseline: started Goal status: INITIAL  2.  Increase R hip strength to 5/5 and knee to 4+/5 for improved functional mobility Baseline: see flow sheets Goal status: INITIAL  3.  Improve 5xSTS by MCID of 5" and by MCID of 20ft as indication of improved functional mobility  Baseline:  TBA Goal status: INITIAL  4.  Pt's LEFS score will improve by the MCID to 45% or greater as indication of improved function  Baseline: 33% Goal status: INITIAL  5.  Pt will be able to walk s an assist device with a min limp 567ft and asc/dsd 12 steps c HR for community mobility Baseline:  Goal status: INITIAL  PLAN:  PT FREQUENCY: 2x/week  PT DURATION: 6 weeks  PLANNED INTERVENTIONS: [id number]- PT Re-evaluation, 97110-Therapeutic exercises, 97530- Therapeutic activity, 97112- Neuromuscular re-education, 97535- Self Care, [id number]- Manual therapy, [id number]- Gait training, [phone]- Aquatic Therapy, [phone] (1-2 muscles), [id number] (3+ muscles)- Dry Needling, Patient/Family education, Balance training, Stair training, Taping, Cryotherapy, and Moist heat  PLAN FOR NEXT SESSION: Assess 5xSTS and when appropriate; assess response to HEP; progress therex as indicated; use of modalities, manual therapy; and TPDN as indicated.   Corretta Munce MS, PT 11/14/23 3:57 PM

## 2023-11-17 ENCOUNTER — Ambulatory Visit: Admitting: Physical Therapy

## 2023-11-18 ENCOUNTER — Ambulatory Visit

## 2023-11-18 DIAGNOSIS — M6281 Muscle weakness (generalized): Secondary | ICD-10-CM

## 2023-11-18 DIAGNOSIS — R262 Difficulty in walking, not elsewhere classified: Secondary | ICD-10-CM

## 2023-11-18 DIAGNOSIS — G8929 Other chronic pain: Secondary | ICD-10-CM

## 2023-11-18 DIAGNOSIS — M25562 Pain in left knee: Secondary | ICD-10-CM | POA: Diagnosis not present

## 2023-11-18 NOTE — Therapy (Signed)
 OUTPATIENT PHYSICAL THERAPY LOWER EXTREMITY EVALUATION   Patient Name: Susan [name] MRN: [id number] DOB:11-19-1980, 43 y.o., female Today's Date: 11/18/2023  END OF SESSION:  PT End of Session - 11/18/23 1201     Visit Number 2    Number of Visits 13    Date for PT Re-Evaluation 01/02/24    Authorization Type [place] MEDICAID [employer]    Authorization - Visit Number 2    Authorization - Number of Visits 27    PT Start Time 1150    PT Stop Time 1235    PT Time Calculation (min) 45 min    Activity Tolerance Patient tolerated treatment well    Behavior During Therapy WFL for tasks assessed/performed              Past Medical History:  Diagnosis Date   AKI (acute kidney injury) (HCC) 06/15/2019   admitted with delhydration r/o arf   Alcohol abuse    Alcoholic hepatitis without ascites    Depression    Family history of adverse reaction to anesthesia    MOTHER HAD PANIC ATTACKS & NAUSEA  AFTER   Gastritis    GIB (gastrointestinal bleeding)    Hypertension    Iron  deficiency anemia    Pancreatitis    PONV (postoperative nausea and vomiting)    Past Surgical History:  Procedure Laterality Date   CERVICAL CERCLAGE Bilateral 01/18/2019   Procedure: CERCLAGE CERVICAL;  Surgeon: Renea Carrion, MD;  Location: [facility];  Service: Gynecology;  Laterality: Bilateral;   DILATATION AND CURETTAGE/HYSTEROSCOPY WITH MINERVA N/A 08/23/2019   Procedure: DILATATION AND CURETTAGE/HYSTEROSCOPY WITH MINERVA;  Surgeon: Vernal Gold, MD;  Location: [facility];  Service: Gynecology;  Laterality: N/A;  rep will be here for this case confirmed on 08/18/19.   ESOPHAGOGASTRODUODENOSCOPY (EGD) WITH PROPOFOL  N/A 06/05/2016   Procedure: ESOPHAGOGASTRODUODENOSCOPY (EGD) WITH PROPOFOL ;  Surgeon: Felecia Hopper, MD;  Location: [facility];  Service: Gastroenterology;  Laterality: N/A;   ICD IMPLANT N/A 02/19/2021   Procedure: ICD IMPLANT;  Surgeon: Verona Goodwill, MD;  Location: [facility];  Service: Cardiovascular;  Laterality: N/A;   MULTIPLE TOOTH EXTRACTIONS     10/02/23   PLACEMENT OF BREAST IMPLANTS     RIGHT/LEFT HEART CATH AND CORONARY ANGIOGRAPHY N/A 02/15/2021   Procedure: RIGHT/LEFT HEART CATH AND CORONARY ANGIOGRAPHY;  Surgeon: Mardell Shade, MD;  Location: [facility];  Service: Cardiovascular;  Laterality: N/A;   Patient Active Problem List   Diagnosis Date Noted   NICM (nonischemic cardiomyopathy) (HCC) 03/07/2022   History of alcohol abuse 03/07/2022   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/2021   Generalized anxiety disorder 03/29/2021   Acute systolic congestive heart failure (HCC) 02/23/2021   Myocardial infarction (HCC) 02/23/2021   Seizure (HCC) 02/23/2021   Cardiac arrest (HCC) 02/13/2021   MDD (major depressive disorder) 11/13/2020   ARF (acute renal failure) (HCC) 02/18/2020   Ketoacidosis 02/18/2020   SVD (11/3) 04/13/2019   AMA (advanced maternal age) multigravida 35+ 04/02/2019   Positive GBS  04/02/2019   Chronic hypertension 04/02/2019   PPROM 04/02/2019   Premature cervical dilation in second trimester 01/16/2019   Anemia 05/20/2018   Bell's palsy 05/20/2018   Superficial phlebitis of arm 05/20/2018   Alcohol-induced pancreatitis 03/25/2018   Foreign body alimentary tract, subsequent encounter 03/25/2018   AKI (acute kidney injury) (HCC) 03/25/2018   Alcoholic hepatitis without ascites 03/25/2018   Alcoholic hepatitis 03/25/2018   History of  acute renal failure 03/25/2018   History of pancreatitis 03/25/2018   Acute pancreatitis 03/22/2018   Gastritis 03/03/2018   Depression 03/03/2018   Iron  deficiency anemia 03/03/2018   Metabolic acidosis 03/31/2017   GI bleed 09/05/2016    PCP: Tristan Furlough, NP   REFERRING PROVIDER: Izetta Marshall, PA   REFERRING DIAG: displaced bicondylar fracture of left tibia ,sequela   THERAPY DIAG:  Chronic pain  of left knee  Muscle weakness (generalized)  Difficulty in walking, not elsewhere classified  Rationale for Evaluation and Treatment: Rehabilitation  ONSET DATE: 10/05/23  SUBJECTIVE:   SUBJECTIVE STATEMENT: Pt reports having an ortho appt this AM and the brace has been Dced and she is full Wbing. Pt notes being told to hold on hopping and jumping until approved by PT.  EVAL: Pt reports she was injured when descending steps, a client who was behind her, fell forward on to her, causing a fall down at least 7 steps. Pt reports currently experiencing low to no pain. 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 Pt will elevate and use a a cold as needed to manage. Pt notes she progresses to full WBnext week.  PERTINENT HISTORY: See   PAIN:  Are you having pain? Yes: NPRS scale: 0/10; pain range 0-2/10 Pain location: Medial L knee Pain description: ache Aggravating factors: Prolonged standing Relieving factors: Rest, elevation  PRECAUTIONS: Knee: 10/16/23-OK for all ROM, isometric quad strengthening, 2 TDWB, then 2 weeks 50% wb, then full  RED FLAGS: None   WEIGHT BEARING RESTRICTIONS: No  FALLS:  Has patient fallen in last 6 months? Yes. Number of falls 1 The 1 causing her injury  LIVING ENVIRONMENT: Lives with: lives with their family Lives in: House/apartment Stairs: Yes: External: 4 steps; can reach both Has following equipment at home: Otho Blitz - 4 wheeled  OCCUPATION: Hair stylist, photographer  PLOF: Independent  PATIENT GOALS: Return to function for work, play with son, yoga  NEXT MD VISIT: 11/18/23  OBJECTIVE:  Note: Objective measures were completed at Evaluation unless otherwise noted.  DIAGNOSTIC FINDINGS:  10/05/23 IMPRESSION: 1. Minimally displaced intra-articular lateral tibial plateau fracture, with associated lipohemarthrosis.  PATIENT SURVEYS:  LEFS: 26/80=33%  COGNITION: Overall cognitive  status: Within functional limits for tasks assessed     SENSATION: WFL  EDEMA:   Minimal noted per palpation  MUSCLE LENGTH: Hamstrings: Right WNLs deg; Left WNLs deg Andy Bannister test: Right WNLs deg; Left WNLs deg  POSTURE: No Significant postural limitations  PALPATION: TTP medial tibial plateau  LOWER EXTREMITY ROM:  Active ROM Right eval Left eval  Hip flexion    Hip extension    Hip abduction    Hip adduction    Hip internal rotation    Hip external rotation    Knee flexion  135  Knee extension  0  Ankle dorsiflexion    Ankle plantarflexion    Ankle inversion    Ankle eversion     (Blank rows = not tested)  LOWER EXTREMITY MMT:  MMT Right eval Left eval  Hip flexion 4+   Hip extension 4   Hip abduction 4   Hip adduction    Hip internal rotation    Hip external rotation 4+   Knee flexion NT   Knee extension NT   Ankle dorsiflexion    Ankle plantarflexion    Ankle inversion    Ankle eversion     (Blank  rows = not tested)  LOWER EXTREMITY SPECIAL TESTS:  NT  FUNCTIONAL TESTS:  5 times sit to stand: TBA 2 minute walk test: TBA  GAIT: Distance walked: 200' Assistive device utilized: Environmental consultant - 4 wheeled; hinged knee brace with flexion to 50d Level of assistance: Modified independence Comments: WB through forefoot and then progressed a heel to toe pattern maintaining 50% WB                                                                                                              TREATMENT DATE:   [facility] Adult PT Treatment:                                                DATE: 11/18/23 Therapeutic Exercise: QS x10 5" SLR 2x10 3# S/L hip abd 2x10  3# S/L clams 2x10 GTB Bridging c abd set GTB 2x 10 5" LAQ 2x10 3# Therapeutic Activity: Rec bike 5 mins L4 STS c taps to mat table 2x10 Standing heel raise 2x1 Standing TKE GTB 2x15 SL standing L LE 4x 30" Self Care: To complete HEP every oher day and walk on the off days starting at 10 mins and  progress by 5 mins every 2-3 walks as tolerated Use of RICE as needed   [facility] Adult PT Treatment:                                                DATE: 11/13/23 Therapeutic Exercise: Developed, instructed in, and pt completed therex as noted in HEP  Therapeutic Activity: Gait training for heel to toe gait pattern c RW, 50% WB  PATIENT EDUCATION:  Education details: Eval findings, POC, HEP, self care- RICE for pain and swelling Person educated: Patient Education method: Explanation, Demonstration, Tactile cues, Verbal cues, and Handouts Education comprehension: verbalized understanding, returned demonstration, verbal cues required, and tactile cues required  HOME EXERCISE PROGRAM: Access Code: [email or web address] URL: [email or web address] Date: 11/18/2023 Prepared by: Liborio Reeds  Exercises - Supine Quad Set  - 2 x daily - 7 x weekly - 2-3 sets - 10 reps - 5 hold - Active Straight Leg Raise with Quad Set (Mirrored)  - 2 x daily - 7 x weekly - 2-3 sets - 10 reps - 3 hold - Sidelying Hip Abduction (Mirrored)  - 2 x daily - 7 x weekly - 2-3 sets - 10 reps - 3 hold - Prone Hip Extension (Mirrored)  - 2 x daily - 7 x weekly - 2-3 sets - 10 reps - 3 hold - Supine Bridge with Resistance Band  - 1 x daily - 7 x weekly - 2-3 sets - 10 reps - 5 hold - Clamshell with Resistance  - 1 x daily - 7 x weekly - 2-3 sets -  10 reps - 3 hold - Sit to Stand Without Arm Support  - 1 x daily - 7 x weekly - 2 sets - 10 reps - Standing Single Leg Stance with Counter Support  - 1 x daily - 7 x weekly - 1 sets - 3-5 reps - 30 hold - Heel Toe Raises with Counter Support  - 1 x daily - 7 x weekly - 2 sets - 15 reps - 2 hold - Standing Terminal Knee Extension with Resistance  - 1 x daily - 7 x weekly - 2 sets - 10 reps - 3 hold  ASSESSMENT:  CLINICAL IMPRESSION: Pt has been progressed to full WB and Dced from the brace per the ortho PA-c . PT was completed today for L knee/LE strengthening in the open chain and  with initial CKC exercises. Provided instructions for returning to walking which is the pt's primary form of exercise. Pt tolerated the prescribed exercises in PT today without adverse effects. Pt is walking without observable gait deficit at a limited distance.      Patient is a 43 y.o. female who was seen today for physical therapy evaluation and treatment for displaced bicondylar fracture of left tibia ,sequela..Pt presents with normal AROM of the L knee, hip weakness, and walking with RW and hinged knee brace. Pt is to progress to full WB next weak. A HEP for LE strengthening was started. Pt will benefit from skilled PT 2w6 to address impairments to optimize L knee function with less pain.     OBJECTIVE IMPAIRMENTS: decreased activity tolerance, decreased balance, difficulty walking, decreased strength, and pain.   ACTIVITY LIMITATIONS: carrying, lifting, standing, squatting, stairs, transfers, and locomotion level  PARTICIPATION LIMITATIONS: meal prep, cleaning, laundry, shopping, community activity, and occupation  PERSONAL FACTORS: Time since onset of injury/illness/exacerbation are also affecting patient's functional outcome.   REHAB POTENTIAL: Good  CLINICAL DECISION MAKING: Evolving/moderate complexity  EVALUATION COMPLEXITY: Moderate   GOALS:  SHORT TERM GOALS=LTGs  LONG TERM GOALS: Target date: 01/02/24  Pt will be Ind in a final HEP to maintain achieved LOF  Baseline: started Goal status: INITIAL  2.  Increase R hip strength to 5/5 and knee to 4+/5 for improved functional mobility Baseline: see flow sheets Goal status: INITIAL  3.  Improve 5xSTS by MCID of 5" and by MCID of 81ft as indication of improved functional mobility  Baseline: TBA Goal status: INITIAL  4.  Pt's LEFS score will improve by the MCID to 45% or greater as indication of improved function  Baseline: 33% Goal status: INITIAL  5.  Pt will be able to walk s an assist device with a min limp  522ft and asc/dsd 12 steps c HR for community mobility Baseline:  Goal status: INITIAL  PLAN:  PT FREQUENCY: 2x/week  PT DURATION: 6 weeks  PLANNED INTERVENTIONS: 97164- PT Re-evaluation, 97110-Therapeutic exercises, 97530- Therapeutic activity, 97112- Neuromuscular re-education, 97535- Self Care, 16109- Manual therapy, [id number]- Gait training, [phone]- Aquatic Therapy, [phone] (1-2 muscles), [id number] (3+ muscles)- Dry Needling, Patient/Family education, Balance training, Stair training, Taping, Cryotherapy, and Moist heat  PLAN FOR NEXT SESSION: Assess 5xSTS and when appropriate; assess response to HEP; progress therex as indicated; use of modalities, manual therapy; and TPDN as indicated.   Mazin Emma MS, PT 11/18/23 1:38 PM

## 2023-11-20 NOTE — Therapy (Incomplete)
 OUTPATIENT PHYSICAL THERAPY LOWER EXTREMITY EVALUATION   Patient Name: Susan [name] MRN: [id number] DOB:1981-03-03, 43 y.o., female Today's Date: 11/20/2023  END OF SESSION:     Past Medical History:  Diagnosis Date   AKI (acute kidney injury) (HCC) 06/15/2019   admitted with delhydration r/o arf   Alcohol abuse    Alcoholic hepatitis without ascites    Depression    Family history of adverse reaction to anesthesia    MOTHER HAD PANIC ATTACKS & NAUSEA  AFTER   Gastritis    GIB (gastrointestinal bleeding)    Hypertension    Iron  deficiency anemia    Pancreatitis    PONV (postoperative nausea and vomiting)    Past Surgical History:  Procedure Laterality Date   CERVICAL CERCLAGE Bilateral 01/18/2019   Procedure: CERCLAGE CERVICAL;  Surgeon: Renea Carrion, MD;  Location: [facility];  Service: Gynecology;  Laterality: Bilateral;   DILATATION AND CURETTAGE/HYSTEROSCOPY WITH MINERVA N/A 08/23/2019   Procedure: DILATATION AND CURETTAGE/HYSTEROSCOPY WITH MINERVA;  Surgeon: Vernal Gold, MD;  Location: [facility];  Service: Gynecology;  Laterality: N/A;  rep will be here for this case confirmed on 08/18/19.   ESOPHAGOGASTRODUODENOSCOPY (EGD) WITH PROPOFOL  N/A 06/05/2016   Procedure: ESOPHAGOGASTRODUODENOSCOPY (EGD) WITH PROPOFOL ;  Surgeon: Felecia Hopper, MD;  Location: [facility];  Service: Gastroenterology;  Laterality: N/A;   ICD IMPLANT N/A 02/19/2021   Procedure: ICD IMPLANT;  Surgeon: Verona Goodwill, MD;  Location: [facility] IN[facility]AS[facility] CV [facility];  Service: Cardiovascular;  Laterality: N/A;   MULTIPLE TOOTH EXTRACTIONS     10/02/23   PLACEMENT OF BREAST IMPLANTS     RIGHT/LEFT HEART CATH AND CORONARY ANGIOGRAPHY N/A 02/15/2021   Procedure: RIGHT/LEFT HEART CATH AND CORONARY ANGIOGRAPHY;  Surgeon: Mardell Shade, MD;  Location: [facility];  Service: Cardiovascular;  Laterality: N/A;   Patient Active Problem List   Diagnosis Date Noted   NICM  (nonischemic cardiomyopathy) (HCC) 03/07/2022   History of alcohol abuse 03/07/2022   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/2021   Generalized anxiety disorder 03/29/2021   Acute systolic congestive heart failure (HCC) 02/23/2021   Myocardial infarction (HCC) 02/23/2021   Seizure (HCC) 02/23/2021   Cardiac arrest (HCC) 02/13/2021   MDD (major depressive disorder) 11/13/2020   ARF (acute renal failure) (HCC) 02/18/2020   Ketoacidosis 02/18/2020   SVD (11/3) 04/13/2019   AMA (advanced maternal age) multigravida 35+ 04/02/2019   Positive GBS  04/02/2019   Chronic hypertension 04/02/2019   PPROM 04/02/2019   Premature cervical dilation in second trimester 01/16/2019   Anemia 05/20/2018   Bell's palsy 05/20/2018   Superficial phlebitis of arm 05/20/2018   Alcohol-induced pancreatitis 03/25/2018   Foreign body alimentary tract, subsequent encounter 03/25/2018   AKI (acute kidney injury) (HCC) 03/25/2018   Alcoholic hepatitis without ascites 03/25/2018   Alcoholic hepatitis 03/25/2018   History of acute renal failure 03/25/2018   History of pancreatitis 03/25/2018   Acute pancreatitis 03/22/2018   Gastritis 03/03/2018   Depression 03/03/2018   Iron  deficiency anemia 03/03/2018   Metabolic acidosis 03/31/2017   GI bleed 09/05/2016    PCP: Tristan Furlough, NP   REFERRING PROVIDER: Karyl Paget D, PA   REFERRING DIAG: displaced bicondylar fracture of left tibia ,sequela   THERAPY DIAG:  No diagnosis found.  Rationale for Evaluation and Treatment: Rehabilitation  ONSET DATE: 10/05/23  SUBJECTIVE:   SUBJECTIVE STATEMENT: Pt reports having an ortho appt this AM and the brace has been Dced and she  is full Wbing. Pt notes being told to hold on hopping and jumping until approved by PT.  EVAL: Pt reports she was injured when descending steps, a client who was behind her, fell forward on to her, causing a fall down at least 7 steps. Pt reports  currently experiencing low to no pain. 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 Pt will elevate and use a a cold as needed to manage. Pt notes she progresses to full WBnext week.  PERTINENT HISTORY: See   PAIN:  Are you having pain? Yes: NPRS scale: 0/10; pain range 0-2/10 Pain location: Medial L knee Pain description: ache Aggravating factors: Prolonged standing Relieving factors: Rest, elevation  PRECAUTIONS: Knee: 10/16/23-OK for all ROM, isometric quad strengthening, 2 TDWB, then 2 weeks 50% wb, then full  RED FLAGS: None   WEIGHT BEARING RESTRICTIONS: No  FALLS:  Has patient fallen in last 6 months? Yes. Number of falls 1 The 1 causing her injury  LIVING ENVIRONMENT: Lives with: lives with their family Lives in: House/apartment Stairs: Yes: External: 4 steps; can reach both Has following equipment at home: Otho Blitz - 4 wheeled  OCCUPATION: Hair stylist, photographer  PLOF: Independent  PATIENT GOALS: Return to function for work, play with son, yoga  NEXT MD VISIT: 11/18/23  OBJECTIVE:  Note: Objective measures were completed at Evaluation unless otherwise noted.  DIAGNOSTIC FINDINGS:  10/05/23 IMPRESSION: 1. Minimally displaced intra-articular lateral tibial plateau fracture, with associated lipohemarthrosis.  PATIENT SURVEYS:  LEFS: 26/80=33%  COGNITION: Overall cognitive status: Within functional limits for tasks assessed     SENSATION: WFL  EDEMA:   Minimal noted per palpation  MUSCLE LENGTH: Hamstrings: Right WNLs deg; Left WNLs deg Susan [name] test: Right WNLs deg; Left WNLs deg  POSTURE: No Significant postural limitations  PALPATION: TTP medial tibial plateau  LOWER EXTREMITY ROM:  Active ROM Right eval Left eval  Hip flexion    Hip extension    Hip abduction    Hip adduction    Hip internal rotation    Hip external rotation    Knee flexion  135  Knee extension  0  Ankle  dorsiflexion    Ankle plantarflexion    Ankle inversion    Ankle eversion     (Blank rows = not tested)  LOWER EXTREMITY MMT:  MMT Right eval Left eval  Hip flexion 4+   Hip extension 4   Hip abduction 4   Hip adduction    Hip internal rotation    Hip external rotation 4+   Knee flexion NT   Knee extension NT   Ankle dorsiflexion    Ankle plantarflexion    Ankle inversion    Ankle eversion     (Blank rows = not tested)  LOWER EXTREMITY SPECIAL TESTS:  NT  FUNCTIONAL TESTS:  5 times sit to stand: TBA 2 minute walk test: TBA  GAIT: Distance walked: 200' Assistive device utilized: Environmental consultant - 4 wheeled; hinged knee brace with flexion to 50d Level of assistance: Modified independence Comments: WB through forefoot and then progressed a heel to toe pattern maintaining 50% WB  TREATMENT DATE:   [facility] Adult PT Treatment:                                                DATE: 11/20/23 Therapeutic Exercise: QS x10 5 SLR 2x10 3# S/L hip abd 2x10  3# S/L clams 2x10 GTB Bridging c abd set GTB 2x 10 5 LAQ 2x10 3# Therapeutic Activity: Rec bike 5 mins L4 STS c taps to mat table 2x10 Standing heel raise 2x1 Standing TKE GTB 2x15 SL standing L LE 4x 30 Self Care: To complete HEP every oher day and walk on the off days starting at 10 mins and progress by 5 mins every 2-3 walks as tolerated Use of RICE as needed  Therapeutic Exercise: *** Manual Therapy: *** Neuromuscular re-ed: *** Therapeutic Activity: *** Modalities: *** Self Care: ***   [facility] Adult PT Treatment:                                                DATE: 11/18/23 Therapeutic Exercise: QS x10 5 SLR 2x10 3# S/L hip abd 2x10  3# S/L clams 2x10 GTB Bridging c abd set GTB 2x 10 5 LAQ 2x10 3# Therapeutic Activity: Rec bike 5 mins L4 STS c taps to mat table 2x10 Standing heel raise 2x1 Standing  TKE GTB 2x15 SL standing L LE 4x 30 Self Care: To complete HEP every oher day and walk on the off days starting at 10 mins and progress by 5 mins every 2-3 walks as tolerated Use of RICE as needed   [facility] Adult PT Treatment:                                                DATE: 11/13/23 Therapeutic Exercise: Developed, instructed in, and pt completed therex as noted in HEP  Therapeutic Activity: Gait training for heel to toe gait pattern c RW, 50% WB  PATIENT EDUCATION:  Education details: Eval findings, POC, HEP, self care- RICE for pain and swelling Person educated: Patient Education method: Explanation, Demonstration, Tactile cues, Verbal cues, and Handouts Education comprehension: verbalized understanding, returned demonstration, verbal cues required, and tactile cues required  HOME EXERCISE PROGRAM: Access Code: [facility] URL: [email or web address] Date: 11/18/2023 Prepared by: Liborio Reeds  Exercises - Supine Quad Set  - 2 x daily - 7 x weekly - 2-3 sets - 10 reps - 5 hold - Active Straight Leg Raise with Quad Set (Mirrored)  - 2 x daily - 7 x weekly - 2-3 sets - 10 reps - 3 hold - Sidelying Hip Abduction (Mirrored)  - 2 x daily - 7 x weekly - 2-3 sets - 10 reps - 3 hold - Prone Hip Extension (Mirrored)  - 2 x daily - 7 x weekly - 2-3 sets - 10 reps - 3 hold - Supine Bridge with Resistance Band  - 1 x daily - 7 x weekly - 2-3 sets - 10 reps - 5 hold - Clamshell with Resistance  - 1 x daily - 7 x weekly - 2-3 sets - 10 reps - 3 hold - Sit to Stand Without  Arm Support  - 1 x daily - 7 x weekly - 2 sets - 10 reps - Standing Single Leg Stance with Counter Support  - 1 x daily - 7 x weekly - 1 sets - 3-5 reps - 30 hold - Heel Toe Raises with Counter Support  - 1 x daily - 7 x weekly - 2 sets - 15 reps - 2 hold - Standing Terminal Knee Extension with Resistance  - 1 x daily - 7 x weekly - 2 sets - 10 reps - 3 hold  ASSESSMENT:  CLINICAL IMPRESSION: Pt has been  progressed to full WB and Dced from the brace per the ortho PA-c . PT was completed today for L knee/LE strengthening in the open chain and with initial CKC exercises. Provided instructions for returning to walking which is the pt's primary form of exercise. Pt tolerated the prescribed exercises in PT today without adverse effects. Pt is walking without observable gait deficit at a limited distance.      Patient is a 43 y.o. female who was seen today for physical therapy evaluation and treatment for displaced bicondylar fracture of left tibia ,sequela..Pt presents with normal AROM of the L knee, hip weakness, and walking with RW and hinged knee brace. Pt is to progress to full WB next weak. A HEP for LE strengthening was started. Pt will benefit from skilled PT 2w6 to address impairments to optimize L knee function with less pain.     OBJECTIVE IMPAIRMENTS: decreased activity tolerance, decreased balance, difficulty walking, decreased strength, and pain.   ACTIVITY LIMITATIONS: carrying, lifting, standing, squatting, stairs, transfers, and locomotion level  PARTICIPATION LIMITATIONS: meal prep, cleaning, laundry, shopping, community activity, and occupation  PERSONAL FACTORS: Time since onset of injury/illness/exacerbation are also affecting patient's functional outcome.   REHAB POTENTIAL: Good  CLINICAL DECISION MAKING: Evolving/moderate complexity  EVALUATION COMPLEXITY: Moderate   GOALS:  SHORT TERM GOALS=LTGs  LONG TERM GOALS: Target date: 01/02/24  Pt will be Ind in a final HEP to maintain achieved LOF  Baseline: started Goal status: INITIAL  2.  Increase R hip strength to 5/5 and knee to 4+/5 for improved functional mobility Baseline: see flow sheets Goal status: INITIAL  3.  Improve 5xSTS by MCID of 5 and by MCID of 42ft as indication of improved functional mobility  Baseline: TBA Goal status: INITIAL  4.  Pt's LEFS score will improve by the MCID to 45% or greater as  indication of improved function  Baseline: 33% Goal status: INITIAL  5.  Pt will be able to walk s an assist device with a min limp 523ft and asc/dsd 12 steps c HR for community mobility Baseline:  Goal status: INITIAL  PLAN:  PT FREQUENCY: 2x/week  PT DURATION: 6 weeks  PLANNED INTERVENTIONS: 97164- PT Re-evaluation, 97110-Therapeutic exercises, 97530- Therapeutic activity, 97112- Neuromuscular re-education, 97535- Self Care, [id number]- Manual therapy, [id number]- Gait training, [phone]- Aquatic Therapy, [phone] (1-2 muscles), [phone] (3+ muscles)- Dry Needling, Patient/Family education, Balance training, Stair training, Taping, Cryotherapy, and Moist heat  PLAN FOR NEXT SESSION: Assess 5xSTS and when appropriate; assess response to HEP; progress therex as indicated; use of modalities, manual therapy; and TPDN as indicated.   Melinna Linarez MS, PT 11/20/23 10:34 PM

## 2023-11-21 ENCOUNTER — Ambulatory Visit

## 2023-11-23 NOTE — Therapy (Incomplete)
 OUTPATIENT PHYSICAL THERAPY LOWER EXTREMITY EVALUATION   Patient Name: Susan [name] MRN: [id number] DOB:1981/01/14, 43 y.o., female Today's Date: 11/23/2023  END OF SESSION:     Past Medical History:  Diagnosis Date   AKI (acute kidney injury) (HCC) 06/15/2019   admitted with delhydration r/o arf   Alcohol abuse    Alcoholic hepatitis without ascites    Depression    Family history of adverse reaction to anesthesia    MOTHER HAD PANIC ATTACKS & NAUSEA  AFTER   Gastritis    GIB (gastrointestinal bleeding)    Hypertension    Iron  deficiency anemia    Pancreatitis    PONV (postoperative nausea and vomiting)    Past Surgical History:  Procedure Laterality Date   CERVICAL CERCLAGE Bilateral 01/18/2019   Procedure: CERCLAGE CERVICAL;  Surgeon: Renea Carrion, MD;  Location: [facility];  Service: Gynecology;  Laterality: Bilateral;   DILATATION AND CURETTAGE/HYSTEROSCOPY WITH MINERVA N/A 08/23/2019   Procedure: DILATATION AND CURETTAGE/HYSTEROSCOPY WITH MINERVA;  Surgeon: Vernal Gold, MD;  Location: [facility];  Service: Gynecology;  Laterality: N/A;  rep will be here for this case confirmed on 08/18/19.   ESOPHAGOGASTRODUODENOSCOPY (EGD) WITH PROPOFOL  N/A 06/05/2016   Procedure: ESOPHAGOGASTRODUODENOSCOPY (EGD) WITH PROPOFOL ;  Surgeon: Felecia Hopper, MD;  Location: [facility];  Service: Gastroenterology;  Laterality: N/A;   ICD IMPLANT N/A 02/19/2021   Procedure: ICD IMPLANT;  Surgeon: Verona Goodwill, MD;  Location: [facility] INVASIVE CV LAB;  Service: Cardiovascular;  Laterality: N/A;   MULTIPLE TOOTH EXTRACTIONS     10/02/23   PLACEMENT OF BREAST IMPLANTS     RIGHT/LEFT HEART CATH AND CORONARY ANGIOGRAPHY N/A 02/15/2021   Procedure: RIGHT/LEFT HEART CATH AND CORONARY ANGIOGRAPHY;  Surgeon: Mardell Shade, MD;  Location: [facility];  Service: Cardiovascular;  Laterality: N/A;   Patient Active Problem List   Diagnosis Date Noted   NICM  (nonischemic cardiomyopathy) (HCC) 03/07/2022   History of alcohol abuse 03/07/2022   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/2021   Generalized anxiety disorder 03/29/2021   Acute systolic congestive heart failure (HCC) 02/23/2021   Myocardial infarction (HCC) 02/23/2021   Seizure (HCC) 02/23/2021   Cardiac arrest (HCC) 02/13/2021   MDD (major depressive disorder) 11/13/2020   ARF (acute renal failure) (HCC) 02/18/2020   Ketoacidosis 02/18/2020   SVD (11/3) 04/13/2019   AMA (advanced maternal age) multigravida 35+ 04/02/2019   Positive GBS  04/02/2019   Chronic hypertension 04/02/2019   PPROM 04/02/2019   Premature cervical dilation in second trimester 01/16/2019   Anemia 05/20/2018   Bell's palsy 05/20/2018   Superficial phlebitis of arm 05/20/2018   Alcohol-induced pancreatitis 03/25/2018   Foreign body alimentary tract, subsequent encounter 03/25/2018   AKI (acute kidney injury) (HCC) 03/25/2018   Alcoholic hepatitis without ascites 03/25/2018   Alcoholic hepatitis 03/25/2018   History of acute renal failure 03/25/2018   History of pancreatitis 03/25/2018   Acute pancreatitis 03/22/2018   Gastritis 03/03/2018   Depression 03/03/2018   Iron  deficiency anemia 03/03/2018   Metabolic acidosis 03/31/2017   GI bleed 09/05/2016    PCP: Tristan Furlough, NP   REFERRING PROVIDER: Karyl Paget D, PA   REFERRING DIAG: displaced bicondylar fracture of left tibia ,sequela   THERAPY DIAG:  No diagnosis found.  Rationale for Evaluation and Treatment: Rehabilitation  ONSET DATE: 10/05/23  SUBJECTIVE:   SUBJECTIVE STATEMENT: Pt reports having an ortho appt this AM and the brace has been Dced and she  is full Wbing. Pt notes being told to hold on hopping and jumping until approved by PT.  EVAL: Pt reports she was injured when descending steps, a client who was behind her, fell forward on to her, causing a fall down at least 7 steps. Pt reports  currently experiencing low to no pain. 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 Pt will elevate and use a a cold as needed to manage. Pt notes she progresses to full WBnext week.  PERTINENT HISTORY: See   PAIN:  Are you having pain? Yes: NPRS scale: 0/10; pain range 0-2/10 Pain location: Medial L knee Pain description: ache Aggravating factors: Prolonged standing Relieving factors: Rest, elevation  PRECAUTIONS: Knee: 10/16/23-OK for all ROM, isometric quad strengthening, 2 TDWB, then 2 weeks 50% wb, then full  RED FLAGS: None   WEIGHT BEARING RESTRICTIONS: No  FALLS:  Has patient fallen in last 6 months? Yes. Number of falls 1 The 1 causing her injury  LIVING ENVIRONMENT: Lives with: lives with their family Lives in: House/apartment Stairs: Yes: External: 4 steps; can reach both Has following equipment at home: Otho Blitz - 4 wheeled  OCCUPATION: Hair stylist, photographer  PLOF: Independent  PATIENT GOALS: Return to function for work, play with son, yoga  NEXT MD VISIT: 11/18/23  OBJECTIVE:  Note: Objective measures were completed at Evaluation unless otherwise noted.  DIAGNOSTIC FINDINGS:  10/05/23 IMPRESSION: 1. Minimally displaced intra-articular lateral tibial plateau fracture, with associated lipohemarthrosis.  PATIENT SURVEYS:  LEFS: 26/80=33%  COGNITION: Overall cognitive status: Within functional limits for tasks assessed     SENSATION: WFL  EDEMA:   Minimal noted per palpation  MUSCLE LENGTH: Hamstrings: Right WNLs deg; Left WNLs deg Andy Bannister test: Right WNLs deg; Left WNLs deg  POSTURE: No Significant postural limitations  PALPATION: TTP medial tibial plateau  LOWER EXTREMITY ROM:  Active ROM Right eval Left eval  Hip flexion    Hip extension    Hip abduction    Hip adduction    Hip internal rotation    Hip external rotation    Knee flexion  135  Knee extension  0  Ankle  dorsiflexion    Ankle plantarflexion    Ankle inversion    Ankle eversion     (Blank rows = not tested)  LOWER EXTREMITY MMT:  MMT Right eval Left eval  Hip flexion 4+   Hip extension 4   Hip abduction 4   Hip adduction    Hip internal rotation    Hip external rotation 4+   Knee flexion NT   Knee extension NT   Ankle dorsiflexion    Ankle plantarflexion    Ankle inversion    Ankle eversion     (Blank rows = not tested)  LOWER EXTREMITY SPECIAL TESTS:  NT  FUNCTIONAL TESTS:  5 times sit to stand: TBA 2 minute walk test: TBA  GAIT: Distance walked: 200' Assistive device utilized: Environmental consultant - 4 wheeled; hinged knee brace with flexion to 50d Level of assistance: Modified independence Comments: WB through forefoot and then progressed a heel to toe pattern maintaining 50% WB  TREATMENT DATE:   [facility] Adult PT Treatment:                                                DATE: 11/25/23 Therapeutic Exercise: QS x10 5 SLR 2x10 3# S/L hip abd 2x10  3# S/L clams 2x10 GTB Bridging c abd set GTB 2x 10 5 LAQ 2x10 3# Therapeutic Activity: Rec bike 5 mins L4 STS c taps to mat table 2x10 Standing heel raise 2x1 Standing TKE GTB 2x15 SL standing L LE 4x 30 Self Care: To complete HEP every oher day and walk on the off days starting at 10 mins and progress by 5 mins every 2-3 walks as tolerated Use of RICE as needed  Therapeutic Exercise: *** Manual Therapy: *** Neuromuscular re-ed: *** Therapeutic Activity: *** Modalities: *** Self Care: ***   [facility] Adult PT Treatment:                                                DATE: 11/18/23 Therapeutic Exercise: QS x10 5 SLR 2x10 3# S/L hip abd 2x10  3# S/L clams 2x10 GTB Bridging c abd set GTB 2x 10 5 LAQ 2x10 3# Therapeutic Activity: Rec bike 5 mins L4 STS c taps to mat table 2x10 Standing heel raise 2x1 Standing  TKE GTB 2x15 SL standing L LE 4x 30 Self Care: To complete HEP every oher day and walk on the off days starting at 10 mins and progress by 5 mins every 2-3 walks as tolerated Use of RICE as needed   [facility] Adult PT Treatment:                                                DATE: 11/13/23 Therapeutic Exercise: Developed, instructed in, and pt completed therex as noted in HEP  Therapeutic Activity: Gait training for heel to toe gait pattern c RW, 50% WB  PATIENT EDUCATION:  Education details: Eval findings, POC, HEP, self care- RICE for pain and swelling Person educated: Patient Education method: Explanation, Demonstration, Tactile cues, Verbal cues, and Handouts Education comprehension: verbalized understanding, returned demonstration, verbal cues required, and tactile cues required  HOME EXERCISE PROGRAM: Access Code: [facility] URL: [email or web address] Date: 11/18/2023 Prepared by: Liborio Reeds  Exercises - Supine Quad Set  - 2 x daily - 7 x weekly - 2-3 sets - 10 reps - 5 hold - Active Straight Leg Raise with Quad Set (Mirrored)  - 2 x daily - 7 x weekly - 2-3 sets - 10 reps - 3 hold - Sidelying Hip Abduction (Mirrored)  - 2 x daily - 7 x weekly - 2-3 sets - 10 reps - 3 hold - Prone Hip Extension (Mirrored)  - 2 x daily - 7 x weekly - 2-3 sets - 10 reps - 3 hold - Supine Bridge with Resistance Band  - 1 x daily - 7 x weekly - 2-3 sets - 10 reps - 5 hold - Clamshell with Resistance  - 1 x daily - 7 x weekly - 2-3 sets - 10 reps - 3 hold - Sit to Stand Without  Arm Support  - 1 x daily - 7 x weekly - 2 sets - 10 reps - Standing Single Leg Stance with Counter Support  - 1 x daily - 7 x weekly - 1 sets - 3-5 reps - 30 hold - Heel Toe Raises with Counter Support  - 1 x daily - 7 x weekly - 2 sets - 15 reps - 2 hold - Standing Terminal Knee Extension with Resistance  - 1 x daily - 7 x weekly - 2 sets - 10 reps - 3 hold  ASSESSMENT:  CLINICAL IMPRESSION: Pt has been  progressed to full WB and Dced from the brace per the ortho PA-c . PT was completed today for L knee/LE strengthening in the open chain and with initial CKC exercises. Provided instructions for returning to walking which is the pt's primary form of exercise. Pt tolerated the prescribed exercises in PT today without adverse effects. Pt is walking without observable gait deficit at a limited distance.      Patient is a 43 y.o. female who was seen today for physical therapy evaluation and treatment for displaced bicondylar fracture of left tibia ,sequela..Pt presents with normal AROM of the L knee, hip weakness, and walking with RW and hinged knee brace. Pt is to progress to full WB next weak. A HEP for LE strengthening was started. Pt will benefit from skilled PT 2w6 to address impairments to optimize L knee function with less pain.     OBJECTIVE IMPAIRMENTS: decreased activity tolerance, decreased balance, difficulty walking, decreased strength, and pain.   ACTIVITY LIMITATIONS: carrying, lifting, standing, squatting, stairs, transfers, and locomotion level  PARTICIPATION LIMITATIONS: meal prep, cleaning, laundry, shopping, community activity, and occupation  PERSONAL FACTORS: Time since onset of injury/illness/exacerbation are also affecting patient's functional outcome.   REHAB POTENTIAL: Good  CLINICAL DECISION MAKING: Evolving/moderate complexity  EVALUATION COMPLEXITY: Moderate   GOALS:  SHORT TERM GOALS=LTGs  LONG TERM GOALS: Target date: 01/02/24  Pt will be Ind in a final HEP to maintain achieved LOF  Baseline: started Goal status: INITIAL  2.  Increase R hip strength to 5/5 and knee to 4+/5 for improved functional mobility Baseline: see flow sheets Goal status: INITIAL  3.  Improve 5xSTS by MCID of 5 and by MCID of 5ft as indication of improved functional mobility  Baseline: TBA Goal status: INITIAL  4.  Pt's LEFS score will improve by the MCID to 45% or greater as  indication of improved function  Baseline: 33% Goal status: INITIAL  5.  Pt will be able to walk s an assist device with a min limp 540ft and asc/dsd 12 steps c HR for community mobility Baseline:  Goal status: INITIAL  PLAN:  PT FREQUENCY: 2x/week  PT DURATION: 6 weeks  PLANNED INTERVENTIONS: 97164- PT Re-evaluation, 97110-Therapeutic exercises, 97530- Therapeutic activity, 97112- Neuromuscular re-education, 97535- Self Care, 64403- Manual therapy, [id number]- Gait training, [id number]- Aquatic Therapy, [phone] (1-2 muscles), [id number] (3+ muscles)- Dry Needling, Patient/Family education, Balance training, Stair training, Taping, Cryotherapy, and Moist heat  PLAN FOR NEXT SESSION: Assess 5xSTS and when appropriate; assess response to HEP; progress therex as indicated; use of modalities, manual therapy; and TPDN as indicated.   Khalie Wince MS, PT 11/23/23 8:21 PM

## 2023-11-24 ENCOUNTER — Ambulatory Visit: Admitting: Physical Therapy

## 2023-11-24 NOTE — Therapy (Incomplete)
 OUTPATIENT PHYSICAL THERAPY LOWER EXTREMITY EVALUATION   Patient Name: Susan [name] MRN: [id number] DOB:12/15/1980, 43 y.o., female Today's Date: 11/24/2023  END OF SESSION:     Past Medical History:  Diagnosis Date   AKI (acute kidney injury) (HCC) 06/15/2019   admitted with delhydration r/o arf   Alcohol abuse    Alcoholic hepatitis without ascites    Depression    Family history of adverse reaction to anesthesia    MOTHER HAD PANIC ATTACKS & NAUSEA  AFTER   Gastritis    GIB (gastrointestinal bleeding)    Hypertension    Iron  deficiency anemia    Pancreatitis    PONV (postoperative nausea and vomiting)    Past Surgical History:  Procedure Laterality Date   CERVICAL CERCLAGE Bilateral 01/18/2019   Procedure: CERCLAGE CERVICAL;  Surgeon: Renea Carrion, MD;  Location: [facility];  Service: Gynecology;  Laterality: Bilateral;   DILATATION AND CURETTAGE/HYSTEROSCOPY WITH MINERVA N/A 08/23/2019   Procedure: DILATATION AND CURETTAGE/HYSTEROSCOPY WITH MINERVA;  Surgeon: Vernal Gold, MD;  Location: [facility];  Service: Gynecology;  Laterality: N/A;  rep will be here for this case confirmed on 08/18/19.   ESOPHAGOGASTRODUODENOSCOPY (EGD) WITH PROPOFOL  N/A 06/05/2016   Procedure: ESOPHAGOGASTRODUODENOSCOPY (EGD) WITH PROPOFOL ;  Surgeon: Felecia Hopper, MD;  Location: [facility];  Service: Gastroenterology;  Laterality: N/A;   ICD IMPLANT N/A 02/19/2021   Procedure: ICD IMPLANT;  Surgeon: Verona Goodwill, MD;  Location: [facility] INVASIVE CV LAB;  Service: Cardiovascular;  Laterality: N/A;   MULTIPLE TOOTH EXTRACTIONS     10/02/23   PLACEMENT OF BREAST IMPLANTS     RIGHT/LEFT HEART CATH AND CORONARY ANGIOGRAPHY N/A 02/15/2021   Procedure: RIGHT/LEFT HEART CATH AND CORONARY ANGIOGRAPHY;  Surgeon: Mardell Shade, MD;  Location: [facility];  Service: Cardiovascular;  Laterality: N/A;   Patient Active Problem List   Diagnosis Date Noted   NICM  (nonischemic cardiomyopathy) (HCC) 03/07/2022   History of alcohol abuse 03/07/2022   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/2021   Generalized anxiety disorder 03/29/2021   Acute systolic congestive heart failure (HCC) 02/23/2021   Myocardial infarction (HCC) 02/23/2021   Seizure (HCC) 02/23/2021   Cardiac arrest (HCC) 02/13/2021   MDD (major depressive disorder) 11/13/2020   ARF (acute renal failure) (HCC) 02/18/2020   Ketoacidosis 02/18/2020   SVD (11/3) 04/13/2019   AMA (advanced maternal age) multigravida 35+ 04/02/2019   Positive GBS  04/02/2019   Chronic hypertension 04/02/2019   PPROM 04/02/2019   Premature cervical dilation in second trimester 01/16/2019   Anemia 05/20/2018   Bell's palsy 05/20/2018   Superficial phlebitis of arm 05/20/2018   Alcohol-induced pancreatitis 03/25/2018   Foreign body alimentary tract, subsequent encounter 03/25/2018   AKI (acute kidney injury) (HCC) 03/25/2018   Alcoholic hepatitis without ascites 03/25/2018   Alcoholic hepatitis 03/25/2018   History of acute renal failure 03/25/2018   History of pancreatitis 03/25/2018   Acute pancreatitis 03/22/2018   Gastritis 03/03/2018   Depression 03/03/2018   Iron  deficiency anemia 03/03/2018   Metabolic acidosis 03/31/2017   GI bleed 09/05/2016    PCP: Tristan Furlough, NP   REFERRING PROVIDER: Karyl Paget D, PA   REFERRING DIAG: displaced bicondylar fracture of left tibia ,sequela   THERAPY DIAG:  No diagnosis found.  Rationale for Evaluation and Treatment: Rehabilitation  ONSET DATE: 10/05/23  SUBJECTIVE:   SUBJECTIVE STATEMENT: Pt reports having an ortho appt this AM and the brace has been Dced and she  is full Wbing. Pt notes being told to hold on hopping and jumping until approved by PT.  EVAL: Pt reports she was injured when descending steps, a client who was behind her, fell forward on to her, causing a fall down at least 7 steps. Pt reports  currently experiencing low to no pain. 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 Pt will elevate and use a a cold as needed to manage. Pt notes she progresses to full WBnext week.  PERTINENT HISTORY: See   PAIN:  Are you having pain? Yes: NPRS scale: 0/10; pain range 0-2/10 Pain location: Medial L knee Pain description: ache Aggravating factors: Prolonged standing Relieving factors: Rest, elevation  PRECAUTIONS: Knee: 10/16/23-OK for all ROM, isometric quad strengthening, 2 TDWB, then 2 weeks 50% wb, then full  RED FLAGS: None   WEIGHT BEARING RESTRICTIONS: No  FALLS:  Has patient fallen in last 6 months? Yes. Number of falls 1 The 1 causing her injury  LIVING ENVIRONMENT: Lives with: lives with their family Lives in: House/apartment Stairs: Yes: External: 4 steps; can reach both Has following equipment at home: Otho Blitz - 4 wheeled  OCCUPATION: Hair stylist, photographer  PLOF: Independent  PATIENT GOALS: Return to function for work, play with son, yoga  NEXT MD VISIT: 11/18/23  OBJECTIVE:  Note: Objective measures were completed at Evaluation unless otherwise noted.  DIAGNOSTIC FINDINGS:  10/05/23 IMPRESSION: 1. Minimally displaced intra-articular lateral tibial plateau fracture, with associated lipohemarthrosis.  PATIENT SURVEYS:  LEFS: 26/80=33%  COGNITION: Overall cognitive status: Within functional limits for tasks assessed     SENSATION: WFL  EDEMA:   Minimal noted per palpation  MUSCLE LENGTH: Hamstrings: Right WNLs deg; Left WNLs deg Andy Bannister test: Right WNLs deg; Left WNLs deg  POSTURE: No Significant postural limitations  PALPATION: TTP medial tibial plateau  LOWER EXTREMITY ROM:  Active ROM Right eval Left eval  Hip flexion    Hip extension    Hip abduction    Hip adduction    Hip internal rotation    Hip external rotation    Knee flexion  135  Knee extension  0  Ankle  dorsiflexion    Ankle plantarflexion    Ankle inversion    Ankle eversion     (Blank rows = not tested)  LOWER EXTREMITY MMT:  MMT Right eval Left eval  Hip flexion 4+   Hip extension 4   Hip abduction 4   Hip adduction    Hip internal rotation    Hip external rotation 4+   Knee flexion NT   Knee extension NT   Ankle dorsiflexion    Ankle plantarflexion    Ankle inversion    Ankle eversion     (Blank rows = not tested)  LOWER EXTREMITY SPECIAL TESTS:  NT  FUNCTIONAL TESTS:  5 times sit to stand: TBA 2 minute walk test: TBA  GAIT: Distance walked: 200' Assistive device utilized: Environmental consultant - 4 wheeled; hinged knee brace with flexion to 50d Level of assistance: Modified independence Comments: WB through forefoot and then progressed a heel to toe pattern maintaining 50% WB  TREATMENT DATE:   [facility] Adult PT Treatment:                                                DATE: 11/18/23 Therapeutic Exercise: QS x10 5 SLR 2x10 3# S/L hip abd 2x10  3# S/L clams 2x10 GTB Bridging c abd set GTB 2x 10 5 LAQ 2x10 3# Therapeutic Activity: Rec bike 5 mins L4 STS c taps to mat table 2x10 Standing heel raise 2x1 Standing TKE GTB 2x15 SL standing L LE 4x 30 Self Care: To complete HEP every oher day and walk on the off days starting at 10 mins and progress by 5 mins every 2-3 walks as tolerated Use of RICE as needed   [facility] Adult PT Treatment:                                                DATE: 11/13/23 Therapeutic Exercise: Developed, instructed in, and pt completed therex as noted in HEP  Therapeutic Activity: Gait training for heel to toe gait pattern c RW, 50% WB  PATIENT EDUCATION:  Education details: Eval findings, POC, HEP, self care- RICE for pain and swelling Person educated: Patient Education method: Explanation, Demonstration, Tactile cues, Verbal cues, and  Handouts Education comprehension: verbalized understanding, returned demonstration, verbal cues required, and tactile cues required  HOME EXERCISE PROGRAM: Access Code: [facility] URL: [email or web address] Date: 11/18/2023 Prepared by: Liborio Reeds  Exercises - Supine Quad Set  - 2 x daily - 7 x weekly - 2-3 sets - 10 reps - 5 hold - Active Straight Leg Raise with Quad Set (Mirrored)  - 2 x daily - 7 x weekly - 2-3 sets - 10 reps - 3 hold - Sidelying Hip Abduction (Mirrored)  - 2 x daily - 7 x weekly - 2-3 sets - 10 reps - 3 hold - Prone Hip Extension (Mirrored)  - 2 x daily - 7 x weekly - 2-3 sets - 10 reps - 3 hold - Supine Bridge with Resistance Band  - 1 x daily - 7 x weekly - 2-3 sets - 10 reps - 5 hold - Clamshell with Resistance  - 1 x daily - 7 x weekly - 2-3 sets - 10 reps - 3 hold - Sit to Stand Without Arm Support  - 1 x daily - 7 x weekly - 2 sets - 10 reps - Standing Single Leg Stance with Counter Support  - 1 x daily - 7 x weekly - 1 sets - 3-5 reps - 30 hold - Heel Toe Raises with Counter Support  - 1 x daily - 7 x weekly - 2 sets - 15 reps - 2 hold - Standing Terminal Knee Extension with Resistance  - 1 x daily - 7 x weekly - 2 sets - 10 reps - 3 hold  ASSESSMENT:  CLINICAL IMPRESSION: Pt has been progressed to full WB and Dced from the brace per the ortho PA-c . PT was completed today for L knee/LE strengthening in the open chain and with initial CKC exercises. Provided instructions for returning to walking which is the pt's primary form of exercise. Pt tolerated the prescribed exercises in PT today without adverse effects. Pt is walking without observable gait  deficit at a limited distance.      Patient is a 43 y.o. female who was seen today for physical therapy evaluation and treatment for displaced bicondylar fracture of left tibia ,sequela..Pt presents with normal AROM of the L knee, hip weakness, and walking with RW and hinged knee brace. Pt is to progress  to full WB next weak. A HEP for LE strengthening was started. Pt will benefit from skilled PT 2w6 to address impairments to optimize L knee function with less pain.     OBJECTIVE IMPAIRMENTS: decreased activity tolerance, decreased balance, difficulty walking, decreased strength, and pain.   ACTIVITY LIMITATIONS: carrying, lifting, standing, squatting, stairs, transfers, and locomotion level  PARTICIPATION LIMITATIONS: meal prep, cleaning, laundry, shopping, community activity, and occupation  PERSONAL FACTORS: Time since onset of injury/illness/exacerbation are also affecting patient's functional outcome.   REHAB POTENTIAL: Good  CLINICAL DECISION MAKING: Evolving/moderate complexity  EVALUATION COMPLEXITY: Moderate   GOALS:  SHORT TERM GOALS=LTGs  LONG TERM GOALS: Target date: 01/02/24  Pt will be Ind in a final HEP to maintain achieved LOF  Baseline: started Goal status: INITIAL  2.  Increase R hip strength to 5/5 and knee to 4+/5 for improved functional mobility Baseline: see flow sheets Goal status: INITIAL  3.  Improve 5xSTS by MCID of 5 and by MCID of 35ft as indication of improved functional mobility  Baseline: TBA Goal status: INITIAL  4.  Pt's LEFS score will improve by the MCID to 45% or greater as indication of improved function  Baseline: 33% Goal status: INITIAL  5.  Pt will be able to walk s an assist device with a min limp 57ft and asc/dsd 12 steps c HR for community mobility Baseline:  Goal status: INITIAL  PLAN:  PT FREQUENCY: 2x/week  PT DURATION: 6 weeks  PLANNED INTERVENTIONS: 97164- PT Re-evaluation, 97110-Therapeutic exercises, 97530- Therapeutic activity, 97112- Neuromuscular re-education, 97535- Self Care, 82956- Manual therapy, [id number]- Gait training, [phone]- Aquatic Therapy, [phone] (1-2 muscles), [id number] (3+ muscles)- Dry Needling, Patient/Family education, Balance training, Stair training, Taping, Cryotherapy, and Moist heat  PLAN FOR  NEXT SESSION: Assess 5xSTS and when appropriate; assess response to HEP; progress therex as indicated; use of modalities, manual therapy; and TPDN as indicated.   Allen Ralls MS, PT 11/24/23 8:16 AM

## 2023-11-25 ENCOUNTER — Ambulatory Visit

## 2023-12-01 ENCOUNTER — Ambulatory Visit: Admitting: Physical Therapy

## 2023-12-01 NOTE — Therapy (Deleted)
 OUTPATIENT PHYSICAL THERAPY LOWER EXTREMITY EVALUATION   Patient Name: Susan [name] MRN: [id number] DOB:1980-11-07, 43 y.o., female Today's Date: 12/01/2023  END OF SESSION:     Past Medical History:  Diagnosis Date   AKI (acute kidney injury) (HCC) 06/15/2019   admitted with delhydration r/o arf   Alcohol abuse    Alcoholic hepatitis without ascites    Depression    Family history of adverse reaction to anesthesia    MOTHER HAD PANIC ATTACKS & NAUSEA  AFTER   Gastritis    GIB (gastrointestinal bleeding)    Hypertension    Iron  deficiency anemia    Pancreatitis    PONV (postoperative nausea and vomiting)    Past Surgical History:  Procedure Laterality Date   CERVICAL CERCLAGE Bilateral 01/18/2019   Procedure: CERCLAGE CERVICAL;  Surgeon: Henry Slough, MD;  Location: [facility];  Service: Gynecology;  Laterality: Bilateral;   DILATATION AND CURETTAGE/HYSTEROSCOPY WITH MINERVA N/A 08/23/2019   Procedure: DILATATION AND CURETTAGE/HYSTEROSCOPY WITH MINERVA;  Surgeon: Gloriann Chick, MD;  Location: [facility];  Service: Gynecology;  Laterality: N/A;  rep will be here for this case confirmed on 08/18/19.   ESOPHAGOGASTRODUODENOSCOPY (EGD) WITH PROPOFOL  N/A 06/05/2016   Procedure: ESOPHAGOGASTRODUODENOSCOPY (EGD) WITH PROPOFOL ;  Surgeon: Layla Lah, MD;  Location: [facility];  Service: Gastroenterology;  Laterality: N/A;   ICD IMPLANT N/A 02/19/2021   Procedure: ICD IMPLANT;  Surgeon: Fernande Elspeth BROCKS, MD;  Location: [facility];  Service: Cardiovascular;  Laterality: N/A;   MULTIPLE TOOTH EXTRACTIONS     10/02/23   PLACEMENT OF BREAST IMPLANTS     RIGHT/LEFT HEART CATH AND CORONARY ANGIOGRAPHY N/A 02/15/2021   Procedure: RIGHT/LEFT HEART CATH AND CORONARY ANGIOGRAPHY;  Surgeon: Cherrie Toribio JONELLE, MD;  Location: [facility];  Service: Cardiovascular;  Laterality: N/A;   Patient Active Problem List   Diagnosis Date Noted   NICM  (nonischemic cardiomyopathy) (HCC) 03/07/2022   History of alcohol abuse 03/07/2022   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/2021   Generalized anxiety disorder 03/29/2021   Acute systolic congestive heart failure (HCC) 02/23/2021   Myocardial infarction (HCC) 02/23/2021   Seizure (HCC) 02/23/2021   Cardiac arrest (HCC) 02/13/2021   MDD (major depressive disorder) 11/13/2020   ARF (acute renal failure) (HCC) 02/18/2020   Ketoacidosis 02/18/2020   SVD (11/3) 04/13/2019   AMA (advanced maternal age) multigravida 35+ 04/02/2019   Positive GBS  04/02/2019   Chronic hypertension 04/02/2019   PPROM 04/02/2019   Premature cervical dilation in second trimester 01/16/2019   Anemia 05/20/2018   Bell's palsy 05/20/2018   Superficial phlebitis of arm 05/20/2018   Alcohol-induced pancreatitis 03/25/2018   Foreign body alimentary tract, subsequent encounter 03/25/2018   AKI (acute kidney injury) (HCC) 03/25/2018   Alcoholic hepatitis without ascites 03/25/2018   Alcoholic hepatitis 03/25/2018   History of acute renal failure 03/25/2018   History of pancreatitis 03/25/2018   Acute pancreatitis 03/22/2018   Gastritis 03/03/2018   Depression 03/03/2018   Iron  deficiency anemia 03/03/2018   Metabolic acidosis 03/31/2017   GI bleed 09/05/2016    PCP: Arby Lyle LABOR, NP   REFERRING PROVIDER: Danton Leas D, PA   REFERRING DIAG: displaced bicondylar fracture of left tibia ,sequela   THERAPY DIAG:  No diagnosis found.  Rationale for Evaluation and Treatment: Rehabilitation  ONSET DATE: 10/05/23  SUBJECTIVE:   SUBJECTIVE STATEMENT:    Pt notes being told to hold on hopping and jumping until approved by  PT.  EVAL: Pt reports she was injured when descending steps, a client who was behind her, fell forward on to her, causing a fall down at least 7 steps. Pt reports currently experiencing low to no pain. 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 Pt will elevate and use a a cold as needed to manage. Pt notes she progresses to full WBnext week.  PERTINENT HISTORY: See   PAIN:  Are you having pain? Yes: NPRS scale: 0/10; pain range 0-2/10 Pain location: Medial L knee Pain description: ache Aggravating factors: Prolonged standing Relieving factors: Rest, elevation  PRECAUTIONS: Knee: 10/16/23-OK for all ROM, isometric quad strengthening, 2 TDWB, then 2 weeks 50% wb, then full  RED FLAGS: None   WEIGHT BEARING RESTRICTIONS: No  FALLS:  Has patient fallen in last 6 months? Yes. Number of falls 1 The 1 causing her injury  LIVING ENVIRONMENT: Lives with: lives with their family Lives in: House/apartment Stairs: Yes: External: 4 steps; can reach both Has following equipment at home: Susan [name] - 4 wheeled  OCCUPATION: Hair stylist, photographer  PLOF: Independent  PATIENT GOALS: Return to function for work, play with son, yoga  NEXT MD VISIT: 11/18/23  OBJECTIVE:  Note: Objective measures were completed at Evaluation unless otherwise noted.  DIAGNOSTIC FINDINGS:  10/05/23 IMPRESSION: 1. Minimally displaced intra-articular lateral tibial plateau fracture, with associated lipohemarthrosis.  PATIENT SURVEYS:  LEFS: 26/80=33%  COGNITION: Overall cognitive status: Within functional limits for tasks assessed     SENSATION: WFL  EDEMA:   Minimal noted per palpation  MUSCLE LENGTH: Hamstrings: Right WNLs deg; Left WNLs deg Susan [name]: Right WNLs deg; Left WNLs deg  POSTURE: No Significant postural limitations  PALPATION: TTP medial tibial plateau  LOWER EXTREMITY ROM:  Active ROM Right eval Left eval  Hip flexion    Hip extension    Hip abduction    Hip adduction    Hip internal rotation    Hip external rotation    Knee flexion  135  Knee extension  0  Ankle dorsiflexion    Ankle plantarflexion    Ankle inversion    Ankle eversion     (Blank rows =  not tested)  LOWER EXTREMITY MMT:  MMT Right eval Left eval  Hip flexion 4+   Hip extension 4   Hip abduction 4   Hip adduction    Hip internal rotation    Hip external rotation 4+   Knee flexion NT   Knee extension NT   Ankle dorsiflexion    Ankle plantarflexion    Ankle inversion    Ankle eversion     (Blank rows = not tested)  LOWER EXTREMITY SPECIAL TESTS:  NT  FUNCTIONAL TESTS:  5 times sit to stand: TBA 2 minute walk [name]: TBA  GAIT: Distance walked: 200' Assistive device utilized: Environmental consultant - 4 wheeled; hinged knee brace with flexion to 50d Level of assistance: Modified independence Comments: WB through forefoot and then progressed a heel to toe pattern maintaining 50% WB  TREATMENT DATE:   [facility] Adult PT Treatment:                                                DATE: 12/02/23 Therapeutic Exercise: QS x10 5 SLR 2x10 3# S/L hip abd 2x10  3# S/L clams 2x10 GTB Bridging c abd set GTB 2x 10 5 LAQ 2x10 3# Therapeutic Activity: Rec bike 5 mins L4 STS c taps to mat table 2x10 Standing heel raise 2x1 Standing TKE GTB 2x15 SL standing L LE 4x 30 Self Care: To complete HEP every oher day and walk on the off days starting at 10 mins and progress by 5 mins every 2-3 walks as tolerated Use of RICE as needed  Therapeutic Exercise: *** Manual Therapy: *** Neuromuscular re-ed: *** Therapeutic Activity: *** Modalities: *** Self Care: ***   Susan [name] Adult PT Treatment:                                                DATE: 11/18/23 Therapeutic Exercise: QS x10 5 SLR 2x10 3# S/L hip abd 2x10  3# S/L clams 2x10 GTB Bridging c abd set GTB 2x 10 5 LAQ 2x10 3# Therapeutic Activity: Rec bike 5 mins L4 STS c taps to mat table 2x10 Standing heel raise 2x1 Standing TKE GTB 2x15 SL standing L LE 4x 30 Self Care: To complete HEP every oher day and walk on the  off days starting at 10 mins and progress by 5 mins every 2-3 walks as tolerated Use of RICE as needed   [facility] Adult PT Treatment:                                                DATE: 11/13/23 Therapeutic Exercise: Developed, instructed in, and pt completed therex as noted in HEP  Therapeutic Activity: Gait training for heel to toe gait pattern c RW, 50% WB  PATIENT EDUCATION:  Education details: Eval findings, POC, HEP, self care- RICE for pain and swelling Person educated: Patient Education method: Explanation, Demonstration, Tactile cues, Verbal cues, and Handouts Education comprehension: verbalized understanding, returned demonstration, verbal cues required, and tactile cues required  HOME EXERCISE PROGRAM: Access Code: [facility] URL: [email or web address] Date: 11/18/2023 Prepared by: Dasie Daft  Exercises - Supine Quad Set  - 2 x daily - 7 x weekly - 2-3 sets - 10 reps - 5 hold - Active Straight Leg Raise with Quad Set (Mirrored)  - 2 x daily - 7 x weekly - 2-3 sets - 10 reps - 3 hold - Sidelying Hip Abduction (Mirrored)  - 2 x daily - 7 x weekly - 2-3 sets - 10 reps - 3 hold - Prone Hip Extension (Mirrored)  - 2 x daily - 7 x weekly - 2-3 sets - 10 reps - 3 hold - Supine Bridge with Resistance Band  - 1 x daily - 7 x weekly - 2-3 sets - 10 reps - 5 hold - Clamshell with Resistance  - 1 x daily - 7 x weekly - 2-3 sets - 10 reps - 3 hold - Sit to Stand Without  Arm Support  - 1 x daily - 7 x weekly - 2 sets - 10 reps - Standing Single Leg Stance with Counter Support  - 1 x daily - 7 x weekly - 1 sets - 3-5 reps - 30 hold - Heel Toe Raises with Counter Support  - 1 x daily - 7 x weekly - 2 sets - 15 reps - 2 hold - Standing Terminal Knee Extension with Resistance  - 1 x daily - 7 x weekly - 2 sets - 10 reps - 3 hold  ASSESSMENT:  CLINICAL IMPRESSION: Pt has been progressed to full WB and Dced from the brace per the ortho PA-c . PT was completed today for L knee/LE  strengthening in the open chain and with initial CKC exercises. Provided instructions for returning to walking which is the pt's primary form of exercise. Pt tolerated the prescribed exercises in PT today without adverse effects. Pt is walking without observable gait deficit at a limited distance.      Patient is a 43 y.o. female who was seen today for physical therapy evaluation and treatment for displaced bicondylar fracture of left tibia ,sequela..Pt presents with normal AROM of the L knee, hip weakness, and walking with RW and hinged knee brace. Pt is to progress to full WB next weak. A HEP for LE strengthening was started. Pt will benefit from skilled PT 2w6 to address impairments to optimize L knee function with less pain.     OBJECTIVE IMPAIRMENTS: decreased activity tolerance, decreased balance, difficulty walking, decreased strength, and pain.   ACTIVITY LIMITATIONS: carrying, lifting, standing, squatting, stairs, transfers, and locomotion level  PARTICIPATION LIMITATIONS: meal prep, cleaning, laundry, shopping, community activity, and occupation  PERSONAL FACTORS: Time since onset of injury/illness/exacerbation are also affecting patient's functional outcome.   REHAB POTENTIAL: Good  CLINICAL DECISION MAKING: Evolving/moderate complexity  EVALUATION COMPLEXITY: Moderate   GOALS:  SHORT TERM GOALS=LTGs  LONG TERM GOALS: Target date: 01/02/24  Pt will be Ind in a final HEP to maintain achieved LOF  Baseline: started Goal status: INITIAL  2.  Increase R hip strength to 5/5 and knee to 4+/5 for improved functional mobility Baseline: see flow sheets Goal status: INITIAL  3.  Improve 5xSTS by MCID of 5 and by MCID of 61ft as indication of improved functional mobility  Baseline: TBA Goal status: INITIAL  4.  Pt's LEFS score will improve by the MCID to 45% or greater as indication of improved function  Baseline: 33% Goal status: INITIAL  5.  Pt will be able to walk s  an assist device with a min limp 543ft and asc/dsd 12 steps c HR for community mobility Baseline:  Goal status: INITIAL  PLAN:  PT FREQUENCY: 2x/week  PT DURATION: 6 weeks  PLANNED INTERVENTIONS: 97164- PT Re-evaluation, 97110-Therapeutic exercises, 97530- Therapeutic activity, 97112- Neuromuscular re-education, 97535- Self Care, [id number]- Manual therapy, [id number]- Gait training, [phone]- Aquatic Therapy, [phone] (1-2 muscles), [phone] (3+ muscles)- Dry Needling, Patient/Family education, Balance training, Stair training, Taping, Cryotherapy, and Moist heat  PLAN FOR NEXT SESSION: Assess 5xSTS and when appropriate; assess response to HEP; progress therex as indicated; use of modalities, manual therapy; and TPDN as indicated.   Fernand Sorbello MS, PT 12/01/23 9:47 PM

## 2023-12-02 ENCOUNTER — Ambulatory Visit
